# Patient Record
Sex: Male | Born: 1960 | Race: Black or African American | Hispanic: No | Marital: Single | Smoking: Current some day smoker
Health system: Southern US, Community
[De-identification: ages and names within clinical notes are randomized; demographics above are authoritative.]

## PROBLEM LIST (undated history)

## (undated) ENCOUNTER — Telehealth

## (undated) ENCOUNTER — Telehealth: Attending: Registered" | Primary: Registered"

## (undated) ENCOUNTER — Ambulatory Visit: Payer: MEDICAID

## (undated) ENCOUNTER — Encounter: Attending: Nurse Practitioner | Primary: Nurse Practitioner

## (undated) ENCOUNTER — Encounter: Attending: Hematology & Oncology | Primary: Hematology & Oncology

## (undated) ENCOUNTER — Telehealth: Attending: Clinical | Primary: Clinical

## (undated) ENCOUNTER — Telehealth
Attending: Student in an Organized Health Care Education/Training Program | Primary: Student in an Organized Health Care Education/Training Program

## (undated) ENCOUNTER — Ambulatory Visit: Payer: MEDICAID | Attending: Hematology & Oncology | Primary: Hematology & Oncology

## (undated) ENCOUNTER — Encounter
Attending: Student in an Organized Health Care Education/Training Program | Primary: Student in an Organized Health Care Education/Training Program

## (undated) ENCOUNTER — Ambulatory Visit

## (undated) ENCOUNTER — Telehealth: Attending: Hematology & Oncology | Primary: Hematology & Oncology

## (undated) ENCOUNTER — Encounter

## (undated) ENCOUNTER — Encounter: Attending: Internal Medicine | Primary: Internal Medicine

## (undated) ENCOUNTER — Ambulatory Visit: Payer: MEDICAID | Attending: Nurse Practitioner | Primary: Nurse Practitioner

## (undated) ENCOUNTER — Encounter: Attending: Oncology | Primary: Oncology

## (undated) ENCOUNTER — Ambulatory Visit
Payer: MEDICAID | Attending: Student in an Organized Health Care Education/Training Program | Primary: Student in an Organized Health Care Education/Training Program

## (undated) ENCOUNTER — Telehealth: Attending: Nurse Practitioner | Primary: Nurse Practitioner

## (undated) ENCOUNTER — Ambulatory Visit
Payer: MEDICAID | Attending: Pharmacist Clinician (PhC)/ Clinical Pharmacy Specialist | Primary: Pharmacist Clinician (PhC)/ Clinical Pharmacy Specialist

## (undated) ENCOUNTER — Telehealth: Attending: Geriatric Medicine | Primary: Geriatric Medicine

## (undated) ENCOUNTER — Ambulatory Visit: Payer: MEDICAID | Attending: Clinical | Primary: Clinical

## (undated) ENCOUNTER — Encounter: Attending: Clinical | Primary: Clinical

## (undated) ENCOUNTER — Telehealth
Attending: Pharmacist Clinician (PhC)/ Clinical Pharmacy Specialist | Primary: Pharmacist Clinician (PhC)/ Clinical Pharmacy Specialist

## (undated) ENCOUNTER — Institutional Professional Consult (permissible substitution)
Payer: MEDICAID | Attending: Student in an Organized Health Care Education/Training Program | Primary: Student in an Organized Health Care Education/Training Program

## (undated) ENCOUNTER — Ambulatory Visit: Payer: MEDICAID | Attending: Geriatric Medicine | Primary: Geriatric Medicine

## (undated) ENCOUNTER — Ambulatory Visit: Payer: MEDICAID | Attending: Internal Medicine | Primary: Internal Medicine

## (undated) ENCOUNTER — Ambulatory Visit
Attending: Pharmacist Clinician (PhC)/ Clinical Pharmacy Specialist | Primary: Pharmacist Clinician (PhC)/ Clinical Pharmacy Specialist

## (undated) ENCOUNTER — Ambulatory Visit: Payer: MEDICAID | Attending: Registered" | Primary: Registered"

## (undated) DIAGNOSIS — I10 Essential (primary) hypertension: Secondary | ICD-10-CM

## (undated) DIAGNOSIS — K859 Acute pancreatitis without necrosis or infection, unspecified: Secondary | ICD-10-CM

## (undated) DIAGNOSIS — C801 Malignant (primary) neoplasm, unspecified: Secondary | ICD-10-CM

## (undated) DIAGNOSIS — J449 Chronic obstructive pulmonary disease, unspecified: Secondary | ICD-10-CM

## (undated) HISTORY — PX: PROSTATE SURGERY: SHX751

---
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
## 2006-01-13 ENCOUNTER — Other Ambulatory Visit: Payer: Self-pay

## 2006-01-13 ENCOUNTER — Emergency Department: Payer: Self-pay | Admitting: Emergency Medicine

## 2015-02-14 ENCOUNTER — Emergency Department: Payer: Self-pay | Admitting: Emergency Medicine

## 2015-03-30 ENCOUNTER — Emergency Department
Admit: 2015-03-30 | Disposition: A | Discharge status: Home / Self Care | Payer: Self-pay | Admitting: Emergency Medicine

## 2015-03-30 LAB — COMPREHENSIVE METABOLIC PANEL
ANION GAP: 16 (ref 7–16)
AST: 65 U/L — AB
Albumin: 3.4 g/dL — ABNORMAL LOW
Alkaline Phosphatase: 51 U/L
BUN: 11 mg/dL
Bilirubin,Total: 0.6 mg/dL
CHLORIDE: 96 mmol/L — AB
Calcium, Total: 8.7 mg/dL — ABNORMAL LOW
Co2: 18 mmol/L — ABNORMAL LOW
Creatinine: 1.36 mg/dL — ABNORMAL HIGH
GFR CALC NON AF AMER: 58 — AB
GLUCOSE: 120 mg/dL — AB
Potassium: 2.7 mmol/L — ABNORMAL LOW
SGPT (ALT): 29 U/L
Sodium: 130 mmol/L — ABNORMAL LOW
TOTAL PROTEIN: 7.6 g/dL

## 2015-03-30 LAB — URINALYSIS, COMPLETE
Bilirubin,UR: NEGATIVE
GLUCOSE, UR: NEGATIVE mg/dL (ref 0–75)
NITRITE: NEGATIVE
PH: 5 (ref 4.5–8.0)
Protein: 100
RBC,UR: 5 /HPF (ref 0–5)
Specific Gravity: 1.02 (ref 1.003–1.030)
Squamous Epithelial: 1
WBC UR: 227 /HPF (ref 0–5)

## 2015-03-30 LAB — CBC WITH DIFFERENTIAL/PLATELET
BASOS PCT: 0.8 %
Basophil #: 0.1 10*3/uL (ref 0.0–0.1)
EOS ABS: 0 10*3/uL (ref 0.0–0.7)
Eosinophil %: 0 %
HCT: 34.8 % — AB (ref 40.0–52.0)
HGB: 12.1 g/dL — AB (ref 13.0–18.0)
Lymphocyte #: 1.3 10*3/uL (ref 1.0–3.6)
Lymphocyte %: 9.8 %
MCH: 32.8 pg (ref 26.0–34.0)
MCHC: 34.8 g/dL (ref 32.0–36.0)
MCV: 94 fL (ref 80–100)
Monocyte #: 1.2 x10 3/mm — ABNORMAL HIGH (ref 0.2–1.0)
Monocyte %: 9.2 %
NEUTROS ABS: 10.6 10*3/uL — AB (ref 1.4–6.5)
Neutrophil %: 80.2 %
Platelet: 196 10*3/uL (ref 150–440)
RBC: 3.69 10*6/uL — ABNORMAL LOW (ref 4.40–5.90)
RDW: 12.6 % (ref 11.5–14.5)
WBC: 13.2 10*3/uL — ABNORMAL HIGH (ref 3.8–10.6)

## 2015-03-30 LAB — TROPONIN I: Troponin-I: <0.03 ng/mL

## 2015-03-30 LAB — LIPASE, BLOOD: LIPASE: 48 U/L

## 2015-04-04 ENCOUNTER — Emergency Department
Admit: 2015-04-04 | Disposition: A | Discharge status: Home / Self Care | Payer: Self-pay | Admitting: Emergency Medicine

## 2015-04-06 ENCOUNTER — Ambulatory Visit
Admit: 2015-04-06 | Disposition: A | Discharge status: Home / Self Care | Payer: Self-pay | Attending: Oncology | Admitting: Oncology

## 2015-04-06 LAB — CBC CANCER CENTER
Basophil #: 0.1 x10 3/mm (ref 0.0–0.1)
Basophil %: 1.4 %
EOS ABS: 0.2 x10 3/mm (ref 0.0–0.7)
EOS PCT: 2 %
HCT: 38.4 % — AB (ref 40.0–52.0)
HGB: 13.5 g/dL (ref 13.0–18.0)
LYMPHS PCT: 25.7 %
Lymphocyte #: 2.4 x10 3/mm (ref 1.0–3.6)
MCH: 32.8 pg (ref 26.0–34.0)
MCHC: 35.1 g/dL (ref 32.0–36.0)
MCV: 94 fL (ref 80–100)
Monocyte #: 0.9 x10 3/mm (ref 0.2–1.0)
Monocyte %: 9.2 %
NEUTROS PCT: 61.7 %
Neutrophil #: 5.7 x10 3/mm (ref 1.4–6.5)
Platelet: 513 x10 3/mm — ABNORMAL HIGH (ref 150–440)
RBC: 4.1 10*6/uL — ABNORMAL LOW (ref 4.40–5.90)
RDW: 12.9 % (ref 11.5–14.5)
WBC: 9.2 x10 3/mm (ref 3.8–10.6)

## 2015-04-06 LAB — COMPREHENSIVE METABOLIC PANEL
Albumin: 3.9 g/dL
Alkaline Phosphatase: 60 U/L
Anion Gap: 10 (ref 7–16)
BILIRUBIN TOTAL: 0.6 mg/dL
BUN: 15 mg/dL
CO2: 24 mmol/L
CREATININE: 1.33 mg/dL — AB
Calcium, Total: 9.2 mg/dL
Chloride: 101 mmol/L
GFR CALC NON AF AMER: 59 — AB
Glucose: 94 mg/dL
Potassium: 3.3 mmol/L — ABNORMAL LOW
SGOT(AST): 57 U/L — ABNORMAL HIGH
SGPT (ALT): 40 U/L
Sodium: 135 mmol/L
TOTAL PROTEIN: 8.5 g/dL — AB

## 2015-04-06 LAB — PROTIME-INR
INR: 1
PROTHROMBIN TIME: 13.4 s

## 2015-04-06 LAB — LACTATE DEHYDROGENASE: LDH: 196 U/L — AB

## 2015-04-06 LAB — LIPASE, BLOOD: LIPASE: 36 U/L

## 2015-04-06 LAB — AMYLASE: AMYLASE: 130 U/L — AB

## 2015-04-07 LAB — PSA: PSA: 7.9 ng/mL — AB (ref 0.0–4.0)

## 2015-04-09 LAB — URINE CULTURE

## 2015-04-12 ENCOUNTER — Other Ambulatory Visit: Payer: Self-pay | Admitting: Oncology

## 2015-04-12 DIAGNOSIS — M549 Dorsalgia, unspecified: Secondary | ICD-10-CM

## 2015-04-17 LAB — BASIC METABOLIC PANEL
Anion Gap: 11 (ref 7–16)
BUN: 15 mg/dL
CALCIUM: 9.1 mg/dL
CO2: 17 mmol/L — AB
CREATININE: 1.09 mg/dL
Chloride: 109 mmol/L
Glucose: 96 mg/dL
POTASSIUM: 3.2 mmol/L — AB
Sodium: 137 mmol/L

## 2015-05-08 ENCOUNTER — Other Ambulatory Visit: Payer: Self-pay | Admitting: *Deleted

## 2015-05-08 DIAGNOSIS — C649 Malignant neoplasm of unspecified kidney, except renal pelvis: Secondary | ICD-10-CM

## 2015-05-08 MED ORDER — OXYCODONE HCL 10 MG PO TABS: ORAL_TABLET | ORAL | Status: DC

## 2015-05-08 NOTE — Telephone Encounter (Signed)
Informed that prescription is ready to pick up  

## 2015-05-08 NOTE — Telephone Encounter (Signed)
Requesting refill on Oxycodone 10 mg, but he is not due a refill  Until Saturday.

## 2015-05-09 ENCOUNTER — Encounter
Admission: RE | Admit: 2015-05-09 | Discharge: 2015-05-09 | Disposition: A | Discharge status: Home / Self Care | Payer: Medicaid Other | Source: Ambulatory Visit | Attending: Oncology | Admitting: Oncology

## 2015-05-09 ENCOUNTER — Other Ambulatory Visit: Payer: Self-pay | Admitting: *Deleted

## 2015-05-09 DIAGNOSIS — M549 Dorsalgia, unspecified: Secondary | ICD-10-CM | POA: Insufficient documentation

## 2015-05-09 DIAGNOSIS — C649 Malignant neoplasm of unspecified kidney, except renal pelvis: Secondary | ICD-10-CM | POA: Diagnosis present

## 2015-05-09 HISTORY — DX: Malignant (primary) neoplasm, unspecified: C80.1

## 2015-05-09 MED ORDER — TECHNETIUM TC 99M MEDRONATE IV KIT
25.0000 | PACK | Freq: Once | INTRAVENOUS | Status: AC | PRN
  Administered 2015-05-09: 23.14 via INTRAVENOUS

## 2015-05-11 ENCOUNTER — Other Ambulatory Visit: Payer: Self-pay

## 2015-05-11 ENCOUNTER — Ambulatory Visit: Payer: Self-pay | Admitting: Oncology

## 2015-05-12 ENCOUNTER — Other Ambulatory Visit: Payer: Self-pay | Admitting: *Deleted

## 2015-05-12 DIAGNOSIS — N2889 Other specified disorders of kidney and ureter: Secondary | ICD-10-CM

## 2015-05-15 ENCOUNTER — Other Ambulatory Visit: Payer: Self-pay

## 2015-05-15 ENCOUNTER — Inpatient Hospital Stay: Payer: Medicaid Other | Attending: Oncology | Admitting: Oncology

## 2015-05-16 ENCOUNTER — Telehealth: Payer: Self-pay

## 2015-05-16 NOTE — Telephone Encounter (Signed)
Oncology Nurse Navigator Documentation  Oncology Nurse Navigator Flowsheets 05/16/2015  Navigator Encounter Type Telephone  Interventions Coordination of Care  Coordination of Care MD Appointments  Time Spent with Patient 30   Alec Hart did not show for his appt with Alec Hart on 5/23. Spoke with Alec Hart at [facility] and he did not show for his appt 5/20 at her office either. Called and spoke with his brother Alec Hart who states that he went to [facility] ED yesterday for difficulty with voiding. He was released from the ED. Alec Hart was able to get him an appt 5/25 at [facility] with Alec [facility]. Alec Hart notified due to Alec Hart not being home. Expressed the importance of him making it to this appt and asked him to call me back if would not be able to make this appt date/time. He verbalized understanding. Readback of appt confirmed.

## 2015-05-23 ENCOUNTER — Telehealth: Payer: Self-pay | Admitting: *Deleted

## 2015-05-23 NOTE — Telephone Encounter (Signed)
Informed pt his prescription is not due at this time. Per Alec Hart, we ar enot to refill any more narcotics for this patient as his care has been transferred to [facility] and they are writing narcotics for him

## 2015-11-01 IMAGING — NM NM BONE WHOLE BODY
2 series · 6 of 6 positions shown · non-contrast
Comparison: CT of the chest 04/20/2015. CT abdomen and pelvis
03/30/2015.

CLINICAL DATA: Low back pain. Remote history of prostate cancer.
Left kidney mass.

EXAM:
NUCLEAR MEDICINE WHOLE BODY BONE SCAN
TECHNIQUE: Whole body anterior and posterior images were obtained approximately
3 hours after intravenous injection of radiopharmaceutical.
RADIOPHARMACEUTICALS:  23.14 mCi Kechnetium-VVm MDP IV

[Series 1000: 3 hr wholebody · 2.40mm/px · 2 of 2 frames shown]
[frame 1/2]
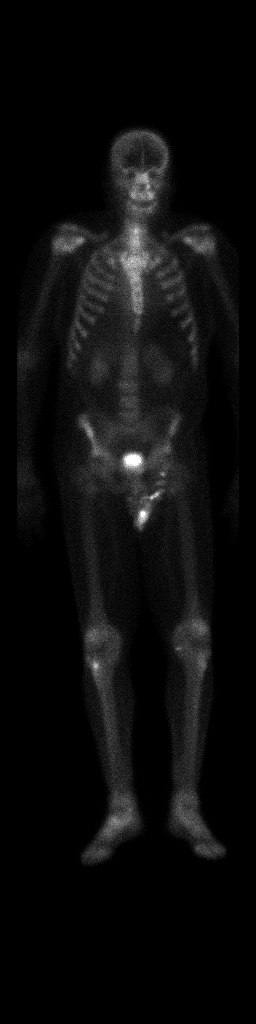
[frame 2/2]
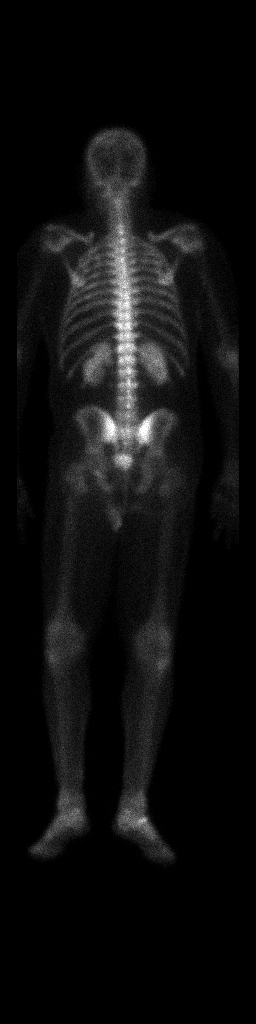

[Series 1000: statics · 2.40mm/px · 2 acquisitions, 4 frames shown]
[im 1/2]
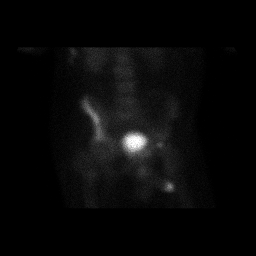
[im 1/2]
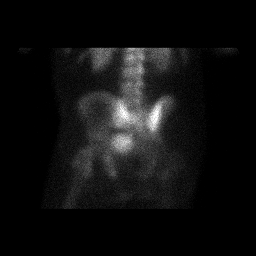
[im 2/2]
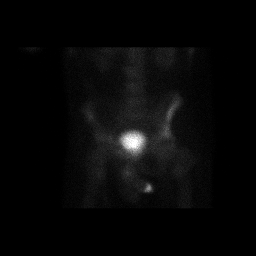
[im 2/2]
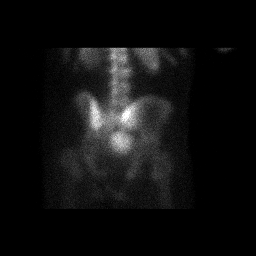

[6 of 6 positions shown; findings below may reference images not displayed]

FINDINGS: No areas of suspicious bony uptake. Increased activity in the knees
and right foot compatible with degenerative uptake. Contamination
external to the patient noted in the left inguinal/groin region.
IMPRESSION: No areas of suspicious bony uptake.

## 2016-05-27 ENCOUNTER — Emergency Department
Admission: EM | Admit: 2016-05-27 | Discharge: 2016-05-27 | Disposition: A | Discharge status: Home / Self Care | Payer: No Typology Code available for payment source | Attending: Emergency Medicine | Admitting: Emergency Medicine

## 2016-05-27 ENCOUNTER — Encounter: Payer: Self-pay | Admitting: Emergency Medicine

## 2016-05-27 DIAGNOSIS — M542 Cervicalgia: Secondary | ICD-10-CM | POA: Diagnosis not present

## 2016-05-27 DIAGNOSIS — Z041 Encounter for examination and observation following transport accident: Secondary | ICD-10-CM | POA: Diagnosis present

## 2016-05-27 DIAGNOSIS — Z8546 Personal history of malignant neoplasm of prostate: Secondary | ICD-10-CM | POA: Diagnosis not present

## 2016-05-27 DIAGNOSIS — Y92410 Unspecified street and highway as the place of occurrence of the external cause: Secondary | ICD-10-CM | POA: Diagnosis not present

## 2016-05-27 DIAGNOSIS — Y939 Activity, unspecified: Secondary | ICD-10-CM | POA: Insufficient documentation

## 2016-05-27 DIAGNOSIS — F17200 Nicotine dependence, unspecified, uncomplicated: Secondary | ICD-10-CM | POA: Insufficient documentation

## 2016-05-27 DIAGNOSIS — Y999 Unspecified external cause status: Secondary | ICD-10-CM | POA: Insufficient documentation

## 2016-05-27 MED ORDER — CYCLOBENZAPRINE HCL 5 MG PO TABS: 5.0000 mg | ORAL_TABLET | Freq: Three times a day (TID) | ORAL | Status: DC | PRN
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
## 2016-12-13 ENCOUNTER — Emergency Department
Admission: EM | Admit: 2016-12-13 | Discharge: 2016-12-13 | Disposition: A | Discharge status: Home / Self Care | Payer: Medicaid Other | Attending: Emergency Medicine | Admitting: Emergency Medicine

## 2016-12-13 ENCOUNTER — Encounter: Payer: Self-pay | Admitting: Emergency Medicine

## 2016-12-13 ENCOUNTER — Emergency Department: Payer: Medicaid Other

## 2016-12-13 DIAGNOSIS — Y929 Unspecified place or not applicable: Secondary | ICD-10-CM | POA: Diagnosis not present

## 2016-12-13 DIAGNOSIS — W010XXA Fall on same level from slipping, tripping and stumbling without subsequent striking against object, initial encounter: Secondary | ICD-10-CM | POA: Insufficient documentation

## 2016-12-13 DIAGNOSIS — Z79899 Other long term (current) drug therapy: Secondary | ICD-10-CM | POA: Insufficient documentation

## 2016-12-13 DIAGNOSIS — S63502A Unspecified sprain of left wrist, initial encounter: Secondary | ICD-10-CM | POA: Diagnosis not present

## 2016-12-13 DIAGNOSIS — Z8546 Personal history of malignant neoplasm of prostate: Secondary | ICD-10-CM | POA: Insufficient documentation

## 2016-12-13 DIAGNOSIS — Y939 Activity, unspecified: Secondary | ICD-10-CM | POA: Insufficient documentation

## 2016-12-13 DIAGNOSIS — W19XXXA Unspecified fall, initial encounter: Secondary | ICD-10-CM

## 2016-12-13 DIAGNOSIS — Y999 Unspecified external cause status: Secondary | ICD-10-CM | POA: Diagnosis not present

## 2016-12-13 DIAGNOSIS — F17200 Nicotine dependence, unspecified, uncomplicated: Secondary | ICD-10-CM | POA: Insufficient documentation

## 2016-12-13 DIAGNOSIS — S6992XA Unspecified injury of left wrist, hand and finger(s), initial encounter: Secondary | ICD-10-CM | POA: Diagnosis present

## 2016-12-13 MED ORDER — OXYCODONE HCL 5 MG PO TABS: 5.0000 mg | ORAL_TABLET | Freq: Four times a day (QID) | ORAL | 0 refills | Status: DC | PRN

## 2016-12-13 MED ORDER — OXYCODONE HCL 5 MG PO TABS
5.0000 mg | ORAL_TABLET | Freq: Once | ORAL | Status: AC
  Administered 2016-12-13: 5 mg via ORAL
  Filled 2016-12-13: qty 1

## 2016-12-13 NOTE — ED Notes (Signed)
Ace wrap and sling applied to left wrist

## 2016-12-13 NOTE — ED Triage Notes (Signed)
Pt presents with left wrist pain after fall 3-4 days ago. Pt states he broke his wrist many years ago, and this feels similar. Pt alert & oriented with NAD noted.

## 2016-12-13 NOTE — ED Provider Notes (Signed)
[facility] Emergency Department Provider Note  ____________________________________________  Time seen: Approximately 10:25 AM  I have reviewed the triage vital signs and the nursing notes.   HISTORY  Chief Complaint Wrist Pain    HPI Alec Hart. is a 55 y.o. male , NAD, presents to the emergency department with four-day history of left wrist pain and swelling. Patient states he tripped and fell with his left arm outstretched. He believes that he sprained his wrist and has been taking over-the-counter Tylenol which has not been helping. Over the last 2 days he has noted significant swelling and pain increasing that is not controlled with over-the-counter medicines. 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 Denies any numbness, weakness or tingling. Has not noted any open wounds or lacerations. Has not had a headache, visual changes, lightheadedness, dizziness, chest pain, shortness breath to cause the fall. Denies head injury, neck or back pain.    Past Medical History:  Diagnosis Date  . Cancer [facility])    prostate ca    There are no active problems to display for this patient.   Past Surgical History:  Procedure Laterality Date  . PROSTATE SURGERY      Prior to Admission medications   Medication Sig Start Date End Date Taking? Authorizing Provider  cyclobenzaprine (FLEXERIL) 5 MG tablet Take 1 tablet (5 mg total) by mouth every 8 (eight) hours as needed for muscle spasms. 05/27/16   Jenise V Bacon Menshew, PA-C  oxyCODONE (OXY IR/ROXICODONE) 5 MG immediate release tablet Take 1 tablet (5 mg total) by mouth every 6 (six) hours as needed for severe pain. 12/13/16   Shylyn Younce L Syncere Eble, PA-C    Allergies Penicillins  History reviewed. No pertinent family history.  Social History Social History  Substance Use Topics  . Smoking status:  Current Some Day Smoker  . Smokeless tobacco: Never Used  . Alcohol use Yes     Comment: occasional     Review of Systems  Constitutional: No fatigue Eyes: No visual changes.  Cardiovascular: No chest pain. Respiratory: No shortness of breath. Musculoskeletal: Positive left wrist pain. Negative for back, neck pain.  Skin: Positive swelling left wrist and hand. Negative for rash, redness, abnormal warmth, bruising, open wounds or lacerations. Neurological: Negative for headaches, focal weakness or numbness. No tingling. No head injury, LOC, lightheadedness or dizziness. 10-point ROS otherwise negative.  ____________________________________________   PHYSICAL EXAM:  VITAL SIGNS: ED Triage Vitals  Enc Vitals Group     BP      Pulse      Resp      Temp      Temp src      SpO2      Weight      Height      Head Circumference      Peak Flow      Pain Score      Pain Loc      Pain Edu?      Excl. in [place]?      Constitutional: Alert and oriented. Well appearing and in no acute distress. Eyes: Conjunctivae are normal.  Head: Atraumatic. Cardiovascular: Good peripheral circulation with 2+ pulses noted in the left upper extremity. Capillary refill is brisk in all digits of the left hand. Respiratory: Normal respiratory effort without tachypnea or retractions.  Musculoskeletal: Significant pain to  palpation over the distal left forearm about the distal radius and ulna but no crepitus or bony abnormalities were noted. No pain to palpation of the left fingers or hand. Patient is able to make a fist but feels pain in the distal forearm when doing so. Neurologic:  Normal speech and language. Gait and posture is normal. No gross focal neurologic deficits are appreciated.  Skin:  Significant swelling is noted about the distal left forearm through the distal left hand dorsally. No open wounds or lesions. Oozing or weeping. Skin is warm, dry and intact. No rash, redness, abnormal warmth,  bruising or lacerations noted. Psychiatric: Mood and affect are normal. Speech and behavior are normal. Patient exhibits appropriate insight and judgement.   ____________________________________________   LABS  None ____________________________________________  EKG  None ____________________________________________  RADIOLOGY I, Braxton Feathers, personally viewed and evaluated these images (plain radiographs) as part of my medical decision making, as well as reviewing the written report by the radiologist.  Dg Wrist Complete Left  Result Date: 12/13/2016 CLINICAL DATA:  Left wrist pain after a fall 3-4 days ago. EXAM: LEFT WRIST - COMPLETE 3+ VIEW COMPARISON:  None. FINDINGS: No evidence of fracture. No subluxation or dislocation. Soft tissue edema is seen in the distal forearm. IMPRESSION: Soft tissue edema without acute bony abnormality. Electronically Signed   By: Misty Stanley M.D.   On: 12/13/2016 10:47    ____________________________________________    PROCEDURES  Procedure(s) performed: None   Procedures   Medications  oxyCODONE (Oxy IR/ROXICODONE) immediate release tablet 5 mg (5 mg Oral Given 12/13/16 1045)     ____________________________________________   INITIAL IMPRESSION / ASSESSMENT AND PLAN / ED COURSE  Pertinent labs & imaging results that were available during my care of the patient were reviewed by me and considered in my medical decision making (see chart for details).  Clinical Course as of Dec 13 1129  Fri Dec 13, 2016  [place] controlled substance database was accessed and reviewed prior to prescribing narcotic pain medications. No narcotic pain medications have been reported to the database for th is patient over the last 12 months.  [JH]    Clinical Course User Index [JH] Louise Rawson L Relena Ivancic, PA-C    Patient's diagnosis is consistent with sprain left wrist due to fall. Patient's left wrist and hand were wrapped in an Ace wrap and  was given an arm sling for comfort care. Patient advised to keep the left wrist and hand elevated over the next couple of days to allow decrease in swelling. Patient was given a tablet of oxycodone in the emergency department and notes significant relief of pain. Patient will be discharged home with prescriptions for oxycodone to take as needed every 6 hours for severe pain and use sparingly. Patient is to follow up with Dr. Rudene Christians in orthopedics if symptoms persist past this treatment course. Patient is given ED precautions to return to the ED for any worsening or new symptoms.    ____________________________________________  FINAL CLINICAL IMPRESSION(S) / ED DIAGNOSES  Final diagnoses:  Sprain of left wrist, initial encounter  Fall, initial encounter      NEW MEDICATIONS STARTED DURING THIS VISIT:  New Prescriptions   OXYCODONE (OXY IR/ROXICODONE) 5 MG IMMEDIATE RELEASE TABLET    Take 1 tablet (5 mg total) by mouth every 6 (six) hours as needed for severe pain.         Braxton Feathers, PA-C 12/13/16 1131    Carrie Mew, MD 12/13/16  1555  

## 2016-12-13 NOTE — ED Notes (Signed)
Pt discharged home after verbalizing understanding of discharge instructions; nad noted. 

## 2017-06-23 ENCOUNTER — Ambulatory Visit
Admission: RE | Admit: 2017-06-23 | Discharge: 2017-06-23 | Disposition: A | Discharge status: Home / Self Care | Payer: MEDICAID

## 2017-06-23 DIAGNOSIS — C61 Malignant neoplasm of prostate: Principal | ICD-10-CM

## 2017-07-04 ENCOUNTER — Ambulatory Visit
Admission: RE | Admit: 2017-07-04 | Discharge: 2017-07-04 | Disposition: A | Discharge status: Home / Self Care | Payer: MEDICAID

## 2017-07-04 DIAGNOSIS — C61 Malignant neoplasm of prostate: Principal | ICD-10-CM

## 2017-07-04 DIAGNOSIS — C7951 Secondary malignant neoplasm of bone: Secondary | ICD-10-CM

## 2017-07-04 MED ORDER — POTASSIUM CHLORIDE ER 20 MEQ TABLET,EXTENDED RELEASE(PART/CRYST)
ORAL_TABLET | Freq: Two times a day (BID) | ORAL | 6 refills | 0.00000 days | Status: CP
Start: 2017-07-04 — End: 2017-08-28

## 2017-07-30 ENCOUNTER — Ambulatory Visit
Admission: RE | Admit: 2017-07-30 | Discharge: 2017-07-30 | Disposition: A | Discharge status: Home / Self Care | Payer: MEDICAID | Admitting: Nurse Practitioner

## 2017-07-30 ENCOUNTER — Ambulatory Visit
Admission: RE | Admit: 2017-07-30 | Discharge: 2017-07-30 | Disposition: A | Discharge status: Home / Self Care | Payer: MEDICAID

## 2017-07-30 DIAGNOSIS — C61 Malignant neoplasm of prostate: Secondary | ICD-10-CM

## 2017-07-30 DIAGNOSIS — C7951 Secondary malignant neoplasm of bone: Secondary | ICD-10-CM

## 2017-07-30 MED ORDER — MAGNESIUM OXIDE 400 MG (241.3 MG MAGNESIUM) TABLET
ORAL_TABLET | Freq: Every day | ORAL | 6 refills | 0 days | Status: CP
Start: 2017-07-30 — End: 2017-09-18

## 2017-07-30 MED ORDER — VITAMIN E 400 UNIT CAPSULE
ORAL_CAPSULE | Freq: Every evening | ORAL | 6 refills | 0.00000 days | Status: CP
Start: 2017-07-30 — End: 2018-05-11

## 2017-07-30 NOTE — Unmapped (Signed)
REASON FOR VISIT: Followup prostate cancer, now metastatic      ASSESSMENT AND PLAN:     1. Prostate cancer:    ?? Lupron due again on or after 10/30/17  ?? Reviewed management of hot flashes  ?? Reviewed Abiraterone and prednisone. He would like to start. Will erx ab 1000mg  daily, pred 5 mg BID to [employer].   ?? He assured me that he will be able to get his labs checked every 2 weeks for the first 3 months. Will need to monitor LFTs and K+ closely    I discussed abiraterone [place] Smoker) including rationale, treatment schedule, and potential side effects including but not limited to fatigue, edema, n/v/d, LFT elevation, electrolyte abnormalities, HTN, cardiac failure, adrenal insufficiency, hepatotoxicity, among others.     Reviewed the importance of taking it at the same time each day. He is aware to notify our team of any new medications (prescribed or OTC) so that we can check for drug-drug interaction. Instructed him to read the package instructions that come along with the prescription.     The dose will be 1,000 mg (250mg  x4) daily, may be dose reduced if necessary.    The patient is clear on the need to take Prednisone 5 mg BID. We will continue his primary ADT.    We like for patients to remain on the same therapy unless 1) there is meaningful PSA progression 2) there is significant clinical progression 3) there is a significant progression on CT scan/bone scan including new tumors or significant growth of existing tumors 4) the patient suffers intolerable/uncontrollable side effects, or 5) the patient desires to stop therapy.      The goal of treatment is to slow the progression of his prostate cancer.  Eventually, the abiraterone will stop working and we will need to explore different treatment. We will incorporate best supportive care and palliative care throughout the trajectory of this disease.      2. Hypokalemia    ?? Continue KDur    3. Hypomagnesimia    ?? erx for mag ox    4. Cramps    ?? Increase water intake  ?? Will check lytes  ?? Nightly vitamin E    Of note, he does not have a working phone right now, so we can call his brother, Joseph Phillips, at [phone]    I'll have him come back in about a month for labs and a visit    Approximately 25 minutes was spent in the care of this patient, half in education and counseling.  A copy of the AVS was provided to the patient which includes key points from our visit as well as our contact information.      Joseph Parr, NP-C  Adult Nurse Practitioner  Urology & Medical Oncology          ONCOLOGIC HISTORY:     [name] is status post a robotic-assisted laparoscopic radical prostatectomy under the care of Dr. 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 There is 40% involvement. His pretreatment PSA was 6.2. He is status post external beam radiation under the care of Dr. Cathren Harsh here [facility] as an adjuvant treatment from 05/29/2009 through 07/11/2009, total of 66.6 Gy.     His PSAs have been rising  presenting a biochemical recurrence.     Last scans: 04/2017    HISTORY OF PRESENT ILLNESS:     [name] is a very pleasant  African-American gentleman who is here today to discuss starting abi/pred    He missed his last appt with me due to transportation issues    He has been compliant with his KDur  Notes he is still having some cramps, mainly in his legs. Better than last visit. Much better, in fact none, on a day where he drank a lot of water.    He denies pain      Allergies   Allergen Reactions   ??? Penicillins      Other reaction(s): UNKNOWN     Meds: Reviewed EPIC    ROS: Pertinent + in HPI    Vitals:    07/30/17 1025   BP: 123/76   Pulse: 74   Temp: 36.4 ??C (97.5 ??F)       PHYSICAL EXAMINATION:    GENERAL: Pleasant, NAD.   VS: Reviewed and unremarkable  Head: normocephalic  Eyes: sclera aniecteric Pulm: Non labored WOB  Skin: Warm, dry  Msk: No Le edema  NEURO: A&O x 3    Wt Readings from Last 6 Encounters:   07/30/17 75.7 kg (166 lb 14.4 oz)   07/04/17 73.2 kg (161 lb 6.4 oz)   05/13/17 72.6 kg (160 lb 0.9 oz)   04/30/17 72.6 kg (160 lb 0.9 oz)   04/07/17 73.3 kg (161 lb 9.6 oz)   03/26/17 72.6 kg (160 lb)         MEDICAL DECISION MAKING:     Lab Results   Component Value Date    PSA 3.68 07/30/2017    PSA 4.47 (H) 07/04/2017    PSA 4.68 (H) 06/23/2017    PSA 48.80 (H) 04/30/2017    PSA 29.10 (H) 03/26/2017    PSA 5.21 (H) 10/02/2016       Lab Results   Component Value Date    PSADIAG 4.4 (H) 09/28/2014    PSADIAG 5.0 (H) 05/30/2014    PSADIAG 4.6 (H) 03/23/2014       CT Urogram: 04/22/17    Impression     - Bilateral renal cysts. No evidence for renal or ureteral mass. No renal calculi.  - Mild non-specific bladder wall irregularity, similar to prior.  - New, small sclerotic foci within the T11 and T12 vertebral bodies; osseous metastases cannot be excluded.  - Mildly prominent right pelvic sidewall lymph node is increased in size from prior, but not enlarged by size criteria. Attention on follow-up.  - Cholelithiasis.  - Hepatic steatosis.     Bone scan 04/29/17    Impression     1.Metastases in the proximal left proximal humeral shaft, T3 vertebral body and inferolateral margin of the left scapula suspected. Radiographic examination and/or CT examination could be considered, if clinically warranted, to confirm findings.

## 2017-08-01 MED ORDER — PREDNISONE 5 MG TABLET
ORAL_TABLET | Freq: Two times a day (BID) | ORAL | 11 refills | 0 days | Status: CP
Start: 2017-08-01 — End: 2018-06-29

## 2017-08-01 MED ORDER — ABIRATERONE 250 MG TABLET: 1000 mg | tablet | Freq: Every day | 11 refills | 0 days | Status: AC

## 2017-08-01 MED ORDER — ABIRATERONE 250 MG TABLET
ORAL_TABLET | Freq: Every day | ORAL | 11 refills | 0.00000 days | Status: CP
Start: 2017-08-01 — End: 2017-11-11

## 2017-08-01 NOTE — Unmapped (Unsigned)
Zytiga - $3

## 2017-08-06 MED FILL — ZYTIGA/250MG/TAB: ZYTIGA/250MG/TAB | 30 days supply | Qty: 120 | Fill #0

## 2017-08-06 MED FILL — PREDNISONE/5MG/TAB: PREDNISONE/5MG/TAB | 30 days supply | Qty: 60 | Fill #0

## 2017-08-06 NOTE — Unmapped (Signed)
Clinical Pharmacist Practitioner: [facility]    New Start Abiraterone Education    [name] is a 56 y.o. male with mCSPC who I am counseling today on initiation of oral chemotherapy.    Oral chemotherapy regimen: Abiraterone 1000 mg daily and prednisone 5 mg twice daily  Tentative Start Date: 08/09/17    Side effects discussed included but were not limited to: hypertension, edema, hypokalemia, fatigue, diarrhea, hot flashes, muscle and joint aches and LFT abnormalities. Side effect prevention and management were also reviewed including LFT monitoring every 2 weeks for 3 months and monthly thereafter.     Administration: I reviewed the importance of taking without food i.e. 1h before of 2h after a meal as well as the importance of medication adherence. Also discussed the use of steroids in combination with abiraterone to avoid side effects of adrenal insufficiency.    Drug Interactions: Instructed the patient that this medication has potential drug interactions. The patient should inform me of any new prescriptions so that I may evaluate for potential drug interactions.other medications reviewed and up to date in Epic.  No drug interactions identified.    Storage requirements: this medicine should be stored at room temperature.     Handling precautions reviewed    Comorbidities/Allergies: reviewed and up to date in Epic.    Patient verbalized understanding of the above information as well as how to contact the Team with any questions/concerns.    Assessment/ Plan:  1. Medication Monitoring- will need labs set up every two weeks starting the first week os September.     Time with patient: 10 min    [name] PharmD, BCOP, CPP  Hematology/Oncology Pharmacist  P: [phone]

## 2017-08-06 NOTE — Unmapped (Signed)
[facility] Onboarding    Specialty Medication(s): Zytiga 1000 mg  Additional  Medication(s): Prednisone 5 mg BID  Diagnosis:   Refills: 30 day    Wilburn Mylar, DOB: Oct 29, 1961  Phone: [phone] (home)   Confirmed Shipping address: [address]  [place] [address]  All above HIPAA information was verified with patient.    Next Scheduled Delivery Date: 08/07/17 from [facility]     Financial/Shipment   Primary Billing: Medicaid  Anticipated copay of $3 reviewed with patient (See full details under Referrals tab in EPIC).    Verified delivery address in FSI and reviewed medication storage requirement.    The following was explained to the patient:  Advised patient of the following:  -A Welcome packet will be sent to the patient   -Assignment of Benefit for the patient to review and return before next refill  -Arrangement of payment method can be done by contacting the pharmacy  -Take medications with during travel, have doctor's appointments, or if being admitted to the hospital.    Advised patient of refill order process:  Specialty pharmacy process for medication shipment was also reviewed.  Discussed the service provided by [facility] with respects to monthly outbound calls to the patient to set up refill deliveries 7-10 days prior to their subsequent needed refill.  Emphasized need for patient to be reachable in order to schedule medication shipment and that shipment will be shipped to the deliverable address provided via UPS.  Informed patient that welcome packet will be sent.        Provided the [facility] pharmacy contact information below:  [facility] [phone], option 4)    Medication Education   Patient was counseled on this medication previously please see prior note.     Patient verbalized understanding of the above information.  Answered all patient questions.  [name] team will follow up with patient monthly for standard refill processing and delivery.      [name] PharmD, BCOP, CPP  Hematology/Oncology Pharmacist  P: [phone]

## 2017-08-07 NOTE — Unmapped (Signed)
I left him a message to call me. He can walk in for the lab appointment. Thanks Fanta Wimberley

## 2017-08-11 ENCOUNTER — Ambulatory Visit
Admission: RE | Admit: 2017-08-11 | Discharge: 2017-08-11 | Disposition: A | Discharge status: Home / Self Care | Payer: MEDICAID

## 2017-08-11 DIAGNOSIS — C7951 Secondary malignant neoplasm of bone: Secondary | ICD-10-CM

## 2017-08-11 DIAGNOSIS — C61 Malignant neoplasm of prostate: Principal | ICD-10-CM

## 2017-08-11 LAB — COMPREHENSIVE METABOLIC PANEL
ALBUMIN: 4.4 g/dL (ref 3.5–5.0)
ALT (SGPT): 43 U/L (ref 19–72)
ANION GAP: 14 mmol/L (ref 9–15)
AST (SGOT): 79 U/L — ABNORMAL HIGH (ref 19–55)
BILIRUBIN TOTAL: 0.5 mg/dL (ref 0.0–1.2)
BLOOD UREA NITROGEN: 19 mg/dL (ref 7–21)
BUN / CREAT RATIO: 17
CALCIUM: 10.1 mg/dL (ref 8.5–10.2)
CHLORIDE: 101 mmol/L (ref 98–107)
CREATININE: 1.12 mg/dL (ref 0.70–1.30)
EGFR MDRD AF AMER: >=60 mL/min/{1.73_m2} (ref >=60)
EGFR MDRD NON AF AMER: >=60 mL/min/{1.73_m2} (ref >=60)
GLUCOSE RANDOM: 76 mg/dL (ref 65–179)
POTASSIUM: 3.3 mmol/L — ABNORMAL LOW (ref 3.5–5.0)
PROTEIN TOTAL: 8.3 g/dL (ref 6.5–8.3)
SODIUM: 138 mmol/L (ref 135–145)

## 2017-08-11 LAB — CO2: Carbon dioxide:SCnc:Pt:Ser/Plas:Qn:: 23

## 2017-08-11 NOTE — Unmapped (Signed)
Lab drawn and sent for analysis.

## 2017-08-12 NOTE — Unmapped (Signed)
Left VM asking him to call us back so that we can confirm whether or not he is taking his potassium supp  mdunn

## 2017-08-13 NOTE — Unmapped (Signed)
Called and left VM for pt to increase potassium to 2 tabs in the morning, 1 tab in the evening. Call if he needs a new rx  mDunn

## 2017-08-22 NOTE — Unmapped (Signed)
Called [phone] and  the number is not in service. Also called other number (brother) and left voicemail with callback number.

## 2017-08-26 NOTE — Unmapped (Signed)
Spoke with pt - let him know that we needs labs since he started on abiraterone. Pt will call me back tomorrow to finalize plans for labs.

## 2017-08-26 NOTE — Unmapped (Signed)
Left message with callback number. Called to follow up on labs.

## 2017-08-27 ENCOUNTER — Ambulatory Visit
Admission: RE | Admit: 2017-08-27 | Discharge: 2017-08-27 | Disposition: A | Discharge status: Home / Self Care | Payer: MEDICAID

## 2017-08-27 DIAGNOSIS — C61 Malignant neoplasm of prostate: Principal | ICD-10-CM

## 2017-08-27 LAB — COMPREHENSIVE METABOLIC PANEL
ALBUMIN: 4 g/dL (ref 3.5–5.0)
ALKALINE PHOSPHATASE: 102 U/L (ref 38–126)
ALT (SGPT): 38 U/L (ref 19–72)
ANION GAP: 11 mmol/L (ref 9–15)
BILIRUBIN TOTAL: 0.5 mg/dL (ref 0.0–1.2)
BLOOD UREA NITROGEN: 16 mg/dL (ref 7–21)
BUN / CREAT RATIO: 16
CALCIUM: 9.1 mg/dL (ref 8.5–10.2)
CHLORIDE: 103 mmol/L (ref 98–107)
CO2: 26 mmol/L (ref 22.0–30.0)
CREATININE: 0.98 mg/dL (ref 0.70–1.30)
EGFR MDRD AF AMER: >=60 mL/min/{1.73_m2} (ref >=60)
GLUCOSE RANDOM: 103 mg/dL (ref 65–179)
POTASSIUM: 3.4 mmol/L — ABNORMAL LOW (ref 3.5–5.0)
PROTEIN TOTAL: 7.7 g/dL (ref 6.5–8.3)
SODIUM: 140 mmol/L (ref 135–145)

## 2017-08-27 LAB — CO2: Carbon dioxide:SCnc:Pt:Ser/Plas:Qn:: 26

## 2017-08-27 NOTE — Unmapped (Signed)
Pt's phone number was not in service. Called brother's number and left message we callback number.

## 2017-08-27 NOTE — Unmapped (Signed)
Lab drawn and sent for analysis.

## 2017-08-28 MED ORDER — POTASSIUM CHLORIDE ER 20 MEQ TABLET,EXTENDED RELEASE(PART/CRYST): 40 meq | tablet | Freq: Every day | 11 refills | 0 days | Status: AC

## 2017-08-28 MED ORDER — POTASSIUM CHLORIDE ER 20 MEQ TABLET,EXTENDED RELEASE(PART/CRYST)
ORAL_TABLET | Freq: Every day | ORAL | 11 refills | 0.00000 days | Status: CP
Start: 2017-08-28 — End: 2017-09-11

## 2017-08-28 NOTE — Unmapped (Signed)
Spoke with pt and let him know that his K was 3.4 and that he was taking KCL 40 meq BID. Instructed to take KCl 60 meq in the morning and at night. Confirmed appointments on 9/10 for labs and to see [name].

## 2017-09-02 NOTE — Unmapped (Signed)
Outpatient Sw Note - Telephone Call    SW received in-basket from [employer], re; Pt missed his appointment yesterday, he reports that gas card he received from [facility] does not work. SW to investigate.     SW contacted patient by phone. SW advised patient to bring unused gas card in for replacement at next appointment. Pt agreed with this plan.     SW available for further consult as needed.

## 2017-09-11 ENCOUNTER — Ambulatory Visit
Admission: RE | Admit: 2017-09-11 | Discharge: 2017-09-11 | Disposition: A | Discharge status: Home / Self Care | Payer: MEDICAID

## 2017-09-11 ENCOUNTER — Ambulatory Visit
Admission: RE | Admit: 2017-09-11 | Discharge: 2017-09-11 | Disposition: A | Discharge status: Home / Self Care | Payer: MEDICAID | Admitting: Nurse Practitioner

## 2017-09-11 DIAGNOSIS — C61 Malignant neoplasm of prostate: Principal | ICD-10-CM

## 2017-09-11 LAB — CBC W/ AUTO DIFF
BASOPHILS ABSOLUTE COUNT: 0.1 10*9/L (ref 0.0–0.1)
EOSINOPHILS ABSOLUTE COUNT: 0.1 10*9/L (ref 0.0–0.4)
HEMATOCRIT: 32.1 % — ABNORMAL LOW (ref 41.0–53.0)
HEMOGLOBIN: 11.3 g/dL — ABNORMAL LOW (ref 13.5–17.5)
LARGE UNSTAINED CELLS: 2 % (ref 0–4)
MEAN CORPUSCULAR HEMOGLOBIN: 33.8 pg (ref 26.0–34.0)
MEAN CORPUSCULAR VOLUME: 95.8 fL (ref 80.0–100.0)
MEAN PLATELET VOLUME: 8 fL (ref 7.0–10.0)
MONOCYTES ABSOLUTE COUNT: 0.4 10*9/L (ref 0.2–0.8)
NEUTROPHILS ABSOLUTE COUNT: 3.9 10*9/L (ref 2.0–7.5)
PLATELET COUNT: 208 10*9/L (ref 150–440)
RED CELL DISTRIBUTION WIDTH: 13.8 % (ref 12.0–15.0)

## 2017-09-11 LAB — COMPREHENSIVE METABOLIC PANEL
ALBUMIN: 4.1 g/dL (ref 3.5–5.0)
ALKALINE PHOSPHATASE: 62 U/L (ref 38–126)
ALT (SGPT): 45 U/L (ref 19–72)
ANION GAP: 16 mmol/L — ABNORMAL HIGH (ref 9–15)
AST (SGOT): 89 U/L — ABNORMAL HIGH (ref 19–55)
BILIRUBIN TOTAL: 0.6 mg/dL (ref 0.0–1.2)
BLOOD UREA NITROGEN: 11 mg/dL (ref 7–21)
BUN / CREAT RATIO: 9
CALCIUM: 9.3 mg/dL (ref 8.5–10.2)
CHLORIDE: 99 mmol/L (ref 98–107)
CO2: 26 mmol/L (ref 22.0–30.0)
CREATININE: 1.16 mg/dL (ref 0.70–1.30)
EGFR MDRD AF AMER: >=60 mL/min/{1.73_m2} (ref >=60)
EGFR MDRD NON AF AMER: >=60 mL/min/{1.73_m2} (ref >=60)
GLUCOSE RANDOM: 105 mg/dL (ref 65–179)
PROTEIN TOTAL: 7.9 g/dL (ref 6.5–8.3)
SODIUM: 141 mmol/L (ref 135–145)

## 2017-09-11 LAB — PROSTATE SPECIFIC ANTIGEN: Prostate specific Ag:MCnc:Pt:Ser/Plas:Qn:: 2.32

## 2017-09-11 LAB — PLATELET COUNT: Lab: 208

## 2017-09-11 LAB — CO2: Carbon dioxide:SCnc:Pt:Ser/Plas:Qn:: 26

## 2017-09-11 MED ORDER — POTASSIUM CHLORIDE ER 20 MEQ TABLET,EXTENDED RELEASE(PART/CRYST)
ORAL_TABLET | Freq: Two times a day (BID) | ORAL | 6 refills | 0.00000 days | Status: CP
Start: 2017-09-11 — End: 2017-09-13

## 2017-09-11 NOTE — Unmapped (Signed)
Pt left clinic today without being seen-lab results completed- potassium low, Per Corrie Dandy Dunn's recommendations-pt needs IV potassium. Pt unable to come back to infusion-increased oral potassium to BID. Pt will come back and have labs rechecked on Saturday 9/22-pt verbalized that he was able to come on Saturday. Sent a request to scheduling.

## 2017-09-11 NOTE — Unmapped (Signed)
Lab drawn and sent for analysis.

## 2017-09-12 NOTE — Unmapped (Signed)
appts 

## 2017-09-12 NOTE — Unmapped (Signed)
-----   Message from Lidia Collum sent at 09/11/2017  4:47 PM EDT -----  Lanier Ensign    Appointment has been scheduled. Patient Joseph Phillips has been notified of appointment details and verbalized satisfaction with appointment date and time.     Thank you,  Amy Graylin Shiver  [facility]  [phone]      ----- Message -----  From: Marica Otter, RN  Sent: 09/11/2017   3:25 PM  To: Ola Spurr Scheduling Infusion    Hi- can you please schedule pt an lab appt for 9/22. Are you going to call pt with time?    Thanks,  J

## 2017-09-13 ENCOUNTER — Ambulatory Visit: Admission: RE | Admit: 2017-09-13 | Discharge: 2017-09-13 | Disposition: A | Discharge status: Home / Self Care

## 2017-09-13 DIAGNOSIS — E876 Hypokalemia: Principal | ICD-10-CM

## 2017-09-13 LAB — POTASSIUM: Potassium:SCnc:Pt:Ser/Plas:Qn:: 3.2 — ABNORMAL LOW

## 2017-09-13 MED ORDER — POTASSIUM CHLORIDE ER 20 MEQ TABLET,EXTENDED RELEASE(PART/CRYST)
ORAL_TABLET | Freq: Two times a day (BID) | ORAL | 0 refills | 0.00000 days | Status: CP
Start: 2017-09-13 — End: 2018-03-26

## 2017-09-13 NOTE — Unmapped (Signed)
If you feel like this is an emergency please call 911.  For appointments or questions Monday through Friday 8AM-5PM please call [phone] or Toll Free [phone]. For Medical questions or concerns ask for the Nurse Triage Line.  On Nights, Weekends, and Holidays call [phone] and ask for the Oncologist on Call.  Reasons to call the Nurse Triage Line:  Fever of 100.5 or greater  Nausea and/or vomiting not relived with nausea medicine  Diarrhea or constipation  Severe pain not relieved with usual pain regimen  Shortness of breath  Uncontrolled bleeding  Mental status changes

## 2017-09-13 NOTE — Unmapped (Signed)
Called by infusion RN. Per policy patient can only get a max of of IV potassium today due to unit protocol of max 59meq/hr (they close at 3pm). K is 3.2. Suggest continue BID for next 3 days then return to maintenance schedule. Refill ok KCL called in.

## 2017-09-13 NOTE — Unmapped (Signed)
Encounter addended by: Annell Greening, RN on: 09/13/2017  3:22 PM<BR>    Actions taken: LDA properties accepted, Flowsheet accepted

## 2017-09-13 NOTE — Unmapped (Signed)
Pt here for potassium replacement; Potassium level drawn as per order. Pt's level is 3.2 - as orders are written, pt would have to have 6 bags of potassium 10 Meq per hour - pt only as a peripheral IV and per policy can only give per hr w/ peripheral IV. Dr.Jolly paged; Will give patient 52 Meq IV today and pt to continue to increase oral supplementation until Monday.     Pt received 30 Meq of Potassium for K of 3.2 today. Pt to continue with increased oral supplementation through Monday. PIV D/C'd intact per protocol. Pt discharged from clinic stable and ambulatory; accompanied by his family.

## 2017-09-17 NOTE — Unmapped (Signed)
Patient has been taking 1 tab trazadone 100mg ,please advise

## 2017-09-18 ENCOUNTER — Ambulatory Visit
Admission: RE | Admit: 2017-09-18 | Discharge: 2017-09-18 | Disposition: A | Discharge status: Home / Self Care | Payer: MEDICAID

## 2017-09-18 DIAGNOSIS — S0993XA Unspecified injury of face, initial encounter: Principal | ICD-10-CM

## 2017-09-18 DIAGNOSIS — C61 Malignant neoplasm of prostate: Principal | ICD-10-CM

## 2017-09-18 LAB — COMPREHENSIVE METABOLIC PANEL
ALBUMIN: 4.4 g/dL (ref 3.5–5.0)
ALKALINE PHOSPHATASE: 79 U/L (ref 38–126)
ANION GAP: 14 mmol/L (ref 9–15)
AST (SGOT): 68 U/L — ABNORMAL HIGH (ref 19–55)
BILIRUBIN TOTAL: 0.8 mg/dL (ref 0.0–1.2)
BLOOD UREA NITROGEN: 15 mg/dL (ref 7–21)
BUN / CREAT RATIO: 18
CALCIUM: 9.3 mg/dL (ref 8.5–10.2)
CHLORIDE: 101 mmol/L (ref 98–107)
CO2: 26 mmol/L (ref 22.0–30.0)
CREATININE: 0.85 mg/dL (ref 0.70–1.30)
EGFR MDRD AF AMER: >=60 mL/min/{1.73_m2} (ref >=60)
GLUCOSE RANDOM: 109 mg/dL (ref 65–179)
POTASSIUM: 3.9 mmol/L (ref 3.5–5.0)
PROTEIN TOTAL: 8.2 g/dL (ref 6.5–8.3)
SODIUM: 141 mmol/L (ref 135–145)

## 2017-09-18 LAB — PSA, DIAGNOSTIC: PROSTATE SPECIFIC ANTIGEN: 1.78 ng/mL (ref 0.00–4.00)

## 2017-09-18 LAB — MAGNESIUM
MAGNESIUM: 0.9 mg/dL — CL (ref 1.6–2.2)
Magnesium:MCnc:Pt:Ser/Plas:Qn:: 0.9 — CL

## 2017-09-18 LAB — CBC W/ AUTO DIFF
BASOPHILS ABSOLUTE COUNT: 0 10*9/L (ref 0.0–0.1)
EOSINOPHILS ABSOLUTE COUNT: 0.1 10*9/L (ref 0.0–0.4)
HEMATOCRIT: 32.8 % — ABNORMAL LOW (ref 41.0–53.0)
HEMOGLOBIN: 11.2 g/dL — ABNORMAL LOW (ref 13.5–17.5)
LARGE UNSTAINED CELLS: 1 % (ref 0–4)
MEAN CORPUSCULAR HEMOGLOBIN CONC: 34.3 g/dL (ref 31.0–37.0)
MEAN CORPUSCULAR HEMOGLOBIN: 33.5 pg (ref 26.0–34.0)
MEAN CORPUSCULAR VOLUME: 97.6 fL (ref 80.0–100.0)
MEAN PLATELET VOLUME: 7.8 fL (ref 7.0–10.0)
MONOCYTES ABSOLUTE COUNT: 0.4 10*9/L (ref 0.2–0.8)
NEUTROPHILS ABSOLUTE COUNT: 3.8 10*9/L (ref 2.0–7.5)
PLATELET COUNT: 210 10*9/L (ref 150–440)
RED BLOOD CELL COUNT: 3.36 10*12/L — ABNORMAL LOW (ref 4.50–5.90)
RED CELL DISTRIBUTION WIDTH: 14.5 % (ref 12.0–15.0)

## 2017-09-18 LAB — CHLORIDE: Chloride:SCnc:Pt:Ser/Plas:Qn:: 101

## 2017-09-18 LAB — PROSTATE SPECIFIC ANTIGEN: Prostate specific Ag:MCnc:Pt:Ser/Plas:Qn:: 1.78

## 2017-09-18 LAB — WBC ADJUSTED: Lab: 5.9

## 2017-09-18 MED ORDER — VITAMIN B COMPLEX CAPSULE
ORAL_CAPSULE | Freq: Every day | ORAL | 6 refills | 0.00000 days | Status: SS
Start: 2017-09-18 — End: 2018-09-26

## 2017-09-18 MED ORDER — MAGNESIUM OXIDE 400 MG (241.3 MG MAGNESIUM) TABLET
ORAL_TABLET | Freq: Two times a day (BID) | ORAL | 6 refills | 0 days | Status: CP
Start: 2017-09-18 — End: 2018-08-21

## 2017-09-18 NOTE — Unmapped (Addendum)
??   Try vitamin B complex pill; sent to your pharmacy  ?? Please restart your magnesium pills. One pill two times/day  ?? Potassium 3 pills two times/day  ?? Keep taking your cancer medicine  ?? Will get facial x-ray    [name], NP-C, OCN  Adult Nurse Practitioner  Urology and Medical Oncology    If you have been prescribed a medication today, always read the package insert that comes with the medication.    In the event of an emergency, always call 911    Urology:  Main clinic & [facility]: [phone]  Fax: [phone]    Cancer Hospital:  Phone: [phone]  Fax: [phone]    After hours/nights/weekends:  Hospital Operator: [phone]    [email or web address]  [email or web address]

## 2017-09-18 NOTE — Unmapped (Signed)
Pt c/o nose bleeding every morning since his last OV.

## 2017-09-18 NOTE — Unmapped (Signed)
REASON FOR VISIT: Followup prostate cancer, now metastatic      ASSESSMENT AND PLAN:     1. Prostate cancer:    ?? Lupron due again on or after 10/30/17  ?? Continue Abiraterone and prednisone.       2. Hypokalemia    ?? Continue KDur, improved    3. Hypomagnesimia    ?? He is unable to stay for infusion  ?? Needs to restart mag ox ASAP; he will pick it up on his way home today  ?? Increase mag ox to bid (he was only taking it daily)    4. Cramps    ?? Increase water intake  ?? Will check lytes  ?? Nightly vitamin E; add vitamin B complex, rx sent    5. Fall    ?? Offered facial x-ray today, he declined but would like to have it done next week    He is going to come back next week for labs, visit, infusion appt in the event he needs lyte replacement.  Seek urgent/emergent care for any sx of electroyle abnorms, which we reviewed    Of note, he does not have a working phone right now, so we can call his brother, [name], at [phone]        Approximately 25 minutes was spent in the care of this patient, half in education and counseling.  A copy of the AVS was provided to the patient which includes key points from our visit as well as our contact information.      [name], NP-C  Adult Nurse Practitioner  Urology & Medical Oncology          ONCOLOGIC HISTORY:     Joseph Phillips is status post a robotic-assisted laparoscopic radical prostatectomy under the care of Dr. 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 There is 40% involvement. His pretreatment PSA was 6.2. He is status post external beam radiation under the care of Dr. Cathren Harsh here at [facility] as an adjuvant treatment from 05/29/2009 through 07/11/2009, total of 66.6 Gy.     His PSAs have been rising presenting a biochemical recurrence.     Last scans: 04/2017    HISTORY OF PRESENT ILLNESS:     Joseph Phillips is a very pleasant  African-American gentleman     He fell last week in the middle of the night when he got up to void and had leg cramps  He hit the left side of his face and said his entire eye was swollen  He still has a bruise under eye and a know over his eye. He says it is much better.     He has been compliant with his KDur and we again discussed the importance of this  Notes he is still having some cramps, mainly in his legs.     He stopped taking his magnesium a week or two ago.           Allergies   Allergen Reactions   ??? Penicillins      Other reaction(s): UNKNOWN     Meds: Reviewed EPIC    ROS: Pertinent + in HPI    Vitals:    09/18/17 0926   BP: 160/87   Pulse: 77   Resp: 16   Temp: 36.6 ??  C (97.8 ??F)   SpO2: 100%       PHYSICAL EXAMINATION:    GENERAL: Pleasant, NAD.   VS: Reviewed and unremarkable  Head: normocephalic  Eyes: sclera aniecteric. Trauma above left eye  Pulm: Non labored WOB  Skin: Warm, dry  Msk: No Le edema  NEURO: A&O x 3    Wt Readings from Last 6 Encounters:   09/18/17 74.7 kg (164 lb 11.2 oz)   09/13/17 74.4 kg (164 lb 0.4 oz)   07/30/17 75.7 kg (166 lb 14.4 oz)   07/04/17 73.2 kg (161 lb 6.4 oz)   05/13/17 72.6 kg (160 lb 0.9 oz)   04/30/17 72.6 kg (160 lb 0.9 oz)         MEDICAL DECISION MAKING:     Lab Results   Component Value Date    PSA 1.78 09/18/2017    PSA 2.32 09/11/2017    PSA 3.68 07/30/2017    PSA 4.47 (H) 07/04/2017    PSA 4.68 (H) 06/23/2017    PSA 48.80 (H) 04/30/2017       Lab Results   Component Value Date    PSADIAG 4.4 (H) 09/28/2014    PSADIAG 5.0 (H) 05/30/2014    PSADIAG 4.6 (H) 03/23/2014       Chemistry        Component Value Date/Time    NA 141 09/18/2017 0857    NA 140 02/20/2015 1121    K 3.9 09/18/2017 0857    K 3.5 02/20/2015 1121    CL 101 09/18/2017 0857    CL 104 02/20/2015 1121    CO2 26.0 09/18/2017 0857    CO2 23 02/20/2015 1121    BUN 15 09/18/2017 0857    BUN 13 02/20/2015 1121    CREATININE 0.85 09/18/2017 0857    CREATININE 1.04 02/20/2015 1121    CREATININE 1.4 07/25/2014 1123    CREATININE 1.6 (H) 07/25/2014 1118    GLU 109 09/18/2017 0857    GLU 112 02/20/2015 1121        Component Value Date/Time    CALCIUM 9.3 09/18/2017 0857    CALCIUM 8.4 (L) 02/20/2015 1121    ALKPHOS 79 09/18/2017 0857    AST 68 (H) 09/18/2017 0857    AST 38 09/11/2011 1637    ALT 33 09/18/2017 0857    ALT 31 09/11/2011 1637    BILITOT 0.8 09/18/2017 0857    BILITOT 0.5 09/11/2011 1637            CT Urogram: 04/22/17    Impression     - Bilateral renal cysts. No evidence for renal or ureteral mass. No renal calculi.  - Mild non-specific bladder wall irregularity, similar to prior.  - New, small sclerotic foci within the T11 and T12 vertebral bodies; osseous metastases cannot be excluded.  - Mildly prominent right pelvic sidewall lymph node is increased in size from prior, but not enlarged by size criteria. Attention on follow-up.  - Cholelithiasis.  - Hepatic steatosis.     Bone scan 04/29/17    Impression     1.Metastases in the proximal left proximal humeral shaft, T3 vertebral body and inferolateral margin of the left scapula suspected. Radiographic examination and/or CT examination could be considered, if clinically warranted, to confirm findings.

## 2017-09-18 NOTE — Unmapped (Signed)
Lab drawn and sent for analysis, care provided by E. Conservation officer, historic buildings

## 2017-09-21 ENCOUNTER — Encounter: Payer: Self-pay | Admitting: Emergency Medicine

## 2017-09-21 ENCOUNTER — Emergency Department
Admission: EM | Admit: 2017-09-21 | Discharge: 2017-09-21 | Disposition: A | Discharge status: Home / Self Care | Payer: Medicaid Other | Attending: Emergency Medicine | Admitting: Emergency Medicine

## 2017-09-21 DIAGNOSIS — F1010 Alcohol abuse, uncomplicated: Secondary | ICD-10-CM

## 2017-09-21 DIAGNOSIS — Z8546 Personal history of malignant neoplasm of prostate: Secondary | ICD-10-CM | POA: Insufficient documentation

## 2017-09-21 DIAGNOSIS — F17200 Nicotine dependence, unspecified, uncomplicated: Secondary | ICD-10-CM | POA: Insufficient documentation

## 2017-09-21 DIAGNOSIS — M2550 Pain in unspecified joint: Secondary | ICD-10-CM | POA: Diagnosis present

## 2017-09-21 DIAGNOSIS — R252 Cramp and spasm: Secondary | ICD-10-CM | POA: Diagnosis not present

## 2017-09-21 LAB — BASIC METABOLIC PANEL
ANION GAP: 14 (ref 5–15)
BUN: 17 mg/dL (ref 6–20)
CO2: 20 mmol/L — AB (ref 22–32)
Calcium: 9.4 mg/dL (ref 8.9–10.3)
Chloride: 99 mmol/L — ABNORMAL LOW (ref 101–111)
Creatinine, Ser: 1.42 mg/dL — ABNORMAL HIGH (ref 0.61–1.24)
GFR calc Af Amer: >60 mL/min (ref >60)
GFR, EST NON AFRICAN AMERICAN: 53 mL/min — AB (ref >60)
GLUCOSE: 93 mg/dL (ref 65–99)
POTASSIUM: 4.3 mmol/L (ref 3.5–5.1)
Sodium: 133 mmol/L — ABNORMAL LOW (ref 135–145)

## 2017-09-21 LAB — CBC WITH DIFFERENTIAL/PLATELET
Basophils Absolute: 0.1 10*3/uL (ref 0–0.1)
Basophils Relative: 1 %
EOS ABS: 0.1 10*3/uL (ref 0–0.7)
Eosinophils Relative: 1 %
HEMATOCRIT: 35.1 % — AB (ref 40.0–52.0)
HEMOGLOBIN: 12.5 g/dL — AB (ref 13.0–18.0)
LYMPHS ABS: 1.1 10*3/uL (ref 1.0–3.6)
LYMPHS PCT: 10 %
MCH: 35.1 pg — ABNORMAL HIGH (ref 26.0–34.0)
MCHC: 35.6 g/dL (ref 32.0–36.0)
MCV: 98.6 fL (ref 80.0–100.0)
MONOS PCT: 7 %
Monocytes Absolute: 0.7 10*3/uL (ref 0.2–1.0)
NEUTROS PCT: 81 %
Neutro Abs: 8.8 10*3/uL — ABNORMAL HIGH (ref 1.4–6.5)
Platelets: 187 10*3/uL (ref 150–440)
RBC: 3.56 MIL/uL — ABNORMAL LOW (ref 4.40–5.90)
RDW: 13.7 % (ref 11.5–14.5)
WBC: 10.7 10*3/uL — ABNORMAL HIGH (ref 3.8–10.6)

## 2017-09-21 LAB — HEPATIC FUNCTION PANEL
ALBUMIN: 4.3 g/dL (ref 3.5–5.0)
ALT: 26 U/L (ref 17–63)
AST: 78 U/L — AB (ref 15–41)
Alkaline Phosphatase: 76 U/L (ref 38–126)
BILIRUBIN DIRECT: 0.4 mg/dL (ref 0.1–0.5)
BILIRUBIN TOTAL: 1.2 mg/dL (ref 0.3–1.2)
Indirect Bilirubin: 0.8 mg/dL (ref 0.3–0.9)
Total Protein: 8.4 g/dL — ABNORMAL HIGH (ref 6.5–8.1)

## 2017-09-21 LAB — MAGNESIUM: MAGNESIUM: 1.5 mg/dL — AB (ref 1.7–2.4)

## 2017-09-21 LAB — PHOSPHORUS: Phosphorus: 3.9 mg/dL (ref 2.5–4.6)

## 2017-09-21 LAB — ETHANOL: Alcohol, Ethyl (B): 57 mg/dL — ABNORMAL HIGH (ref <10)

## 2017-09-21 LAB — LIPASE, BLOOD: Lipase: 44 U/L (ref 11–51)

## 2017-09-21 MED ORDER — SODIUM CHLORIDE 0.9 % IV BOLUS (SEPSIS)
1000.0000 mL | Freq: Once | INTRAVENOUS | Status: AC
  Administered 2017-09-21: 1000 mL via INTRAVENOUS

## 2017-09-21 MED ORDER — MORPHINE SULFATE (PF) 4 MG/ML IV SOLN
4.0000 mg | Freq: Once | INTRAVENOUS | Status: AC
  Administered 2017-09-21: 4 mg via INTRAVENOUS
  Filled 2017-09-21: qty 1

## 2017-09-21 MED ORDER — KETOROLAC TROMETHAMINE 30 MG/ML IJ SOLN
15.0000 mg | Freq: Once | INTRAMUSCULAR | Status: AC
  Administered 2017-09-21: 15 mg via INTRAVENOUS
  Filled 2017-09-21: qty 1

## 2017-09-21 MED ORDER — MAGNESIUM SULFATE 4 GM/100ML IV SOLN
4.0000 g | Freq: Once | INTRAVENOUS | Status: AC
  Administered 2017-09-21: 4 g via INTRAVENOUS
  Filled 2017-09-21: qty 100

## 2017-09-21 NOTE — ED Provider Notes (Signed)
[facility] Emergency Department Provider Note  ____________________________________________   First MD Initiated Contact with Patient 09/21/17 1148     (approximate)  I have reviewed the triage vital signs and the nursing notes.   HISTORY  Chief Complaint Joint Pain (hx of low potassium, low magnesium)   HPI Alec Hart. is a 56 y.o. male who comes to the emergency department via EMS with roughly 24 hours of diffuse severe generalized full body pain. 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 He was seen at the [facility] 3 days ago and his magnesium was found to be 0.9 and they recommended an infusion, however he declined stating he had to get home. He reports intermittent compliance with his potassium and magnesium supplementation. He also has a past medical history of metastatic prostate cancer. His pain is severe throbbing aching constant somewhat worse with movement and somewhat improved with rest but not particularly so. He denies chest pain shortness of breath. He denies abdominal pain nausea or vomiting.   Past Medical History:  Diagnosis Date  . Cancer [facility])    prostate ca    There are no active problems to display for this patient.   Past Surgical History:  Procedure Laterality Date  . PROSTATE SURGERY      Prior to Admission medications   Medication Sig Start Date End Date Taking? Authorizing Provider  cyclobenzaprine (FLEXERIL) 5 MG tablet Take 1 tablet (5 mg total) by mouth every 8 (eight) hours as needed for muscle spasms. 05/27/16   Menshew, Dannielle Karvonen, PA-C  oxyCODONE (OXY IR/ROXICODONE) 5 MG immediate release tablet Take 1 tablet (5 mg total) by mouth every 6 (six) hours as needed for severe pain. 12/13/16   Hagler, Jami L, PA-C    Allergies Penicillins  History reviewed. No pertinent family history.  Social  History Social History  Substance Use Topics  . Smoking status: Current Some Day Smoker  . Smokeless tobacco: Never Used  . Alcohol use Yes     Comment: occasional    Review of Systems Constitutional: No fever/chills Eyes: No visual changes. ENT: No sore throat. Cardiovascular: Denies chest pain. Respiratory: Denies shortness of breath. Gastrointestinal: No abdominal pain.  No nausea, no vomiting.  No diarrhea.  No constipation. Genitourinary: Negative for dysuria. Musculoskeletal: Negative for back pain. Skin: Negative for rash. Neurological: Negative for headaches, focal weakness or numbness.   ____________________________________________   PHYSICAL EXAM:  VITAL SIGNS: ED Triage Vitals  Enc Vitals Group     BP 09/21/17 1146 135/85     Pulse Rate 09/21/17 1146 78     Resp 09/21/17 1146 19     Temp 09/21/17 1146 97.8 F (36.6 C)     Temp Source 09/21/17 1146 Oral     SpO2 09/21/17 1146 100 %     Weight 09/21/17 1148 164 lb (74.4 kg)     Height 09/21/17 1148 5' 8.5" (1.74 m)     Head Circumference --      Peak Flow --      Pain Score --      Pain Loc --      Pain Edu? --      Excl. in [place]? --     Constitutional: alert and oriented 4 appears somewhat uncomfortable nontoxic no diaphoresis speaks full clear sentences Eyes: PERRL EOMI. Head: Atraumatic. Nose: No congestion/rhinnorhea. Mouth/Throat: No trismus Neck: No stridor.  Cardiovascular: Normal rate, regular rhythm. Grossly normal heart sounds.  Good peripheral circulation. Respiratory: Normal respiratory effort.  No retractions. Lungs CTAB and moving good air Gastrointestinal: soft nontender Musculoskeletal: No lower extremity edema   Neurologic:  Normal speech and language. No gross focal neurologic deficits are appreciated. Skin:  Skin is warm, dry and intact. No rash noted. Psychiatric: Mood and affect are normal. Speech and behavior are normal.    ____________________________________________    DIFFERENTIAL includes but not limited to  polyarthritis, myopathy, metabolic derangement, chronic pancreatitis ____________________________________________   LABS (all labs ordered are listed, but only abnormal results are displayed)  Labs Reviewed  BASIC METABOLIC PANEL - Abnormal; Notable for the following:       Result Value   Sodium 133 (*)    Chloride 99 (*)    CO2 20 (*)    Creatinine, Ser 1.42 (*)    GFR calc non Af Amer 53 (*)    All other components within normal limits  HEPATIC FUNCTION PANEL - Abnormal; Notable for the following:    Total Protein 8.4 (*)    AST 78 (*)    All other components within normal limits  CBC WITH DIFFERENTIAL/PLATELET - Abnormal; Notable for the following:    WBC 10.7 (*)    RBC 3.56 (*)    Hemoglobin 12.5 (*)    HCT 35.1 (*)    MCH 35.1 (*)    Neutro Abs 8.8 (*)    All other components within normal limits  MAGNESIUM - Abnormal; Notable for the following:    Magnesium 1.5 (*)    All other components within normal limits  ETHANOL - Abnormal; Notable for the following:    Alcohol, Ethyl (B) 57 (*)    All other components within normal limits  LIPASE, BLOOD  PHOSPHORUS    blood work reviewed interpreted by me shows hypomagnesemia __________________________________________  EKG  ED ECG REPORT I, Darel Hong, the attending physician, personally viewed and interpreted this ECG.  Date: 09/21/2017 EKG Time:  Rate: 76 Rhythm: normal sinus rhythm QRS Axis: normal Intervals: normal ST/T Wave abnormalities: normal Narrative Interpretation: no evidence of acute ischemia  ____________________________________________  RADIOLOGY   ____________________________________________   PROCEDURES  Procedure(s) performed: no  Procedures  Critical Care performed: no  Observation: no ____________________________________________   INITIAL IMPRESSION / ASSESSMENT AND PLAN / ED COURSE  Pertinent labs & imaging results that were  available during my care of the patient were reviewed by me and considered in my medical decision making (see chart for details).  The patient arrives somewhat uncomfortable appearing with reported history of multiple metabolic derangements. Blood work today shows hypomagnesemia which is been replenished with 4 g. Following this his symptoms are nearly completely resolved. I've encouraged him to remain compliant with his outpatient medications and to follow up as currently scheduled. He verbalizes understanding and agreement with the plan.      ____________________________________________   FINAL CLINICAL IMPRESSION(S) / ED DIAGNOSES  Final diagnoses:  Alcohol abuse  Hypomagnesemia  Muscle cramps      NEW MEDICATIONS STARTED DURING THIS VISIT:  Discharge Medication List as of 09/21/2017  2:06 PM       Note:  This document was prepared using Dragon voice recognition software and may include unintentional dictation errors.     Darel Hong, MD 09/21/17 2223

## 2017-09-21 NOTE — Discharge Instructions (Signed)
Today your magnesium was 1.5 which is low although not as low as it is been in the past. Please make sure you continue to take your over-the-counter supplementation the way your primary care physician wants you too and follow-up next week for reevaluation. Return to the emergency department sooner for any concerns.  It was a pleasure to take care of you today, and thank you for coming to our emergency department.  If you have any questions or concerns before leaving please ask the nurse to grab me and I'm more than happy to go through your aftercare instructions again.  If you were prescribed any opioid pain medication today such as Norco, Vicodin, Percocet, morphine, hydrocodone, or oxycodone please make sure you do not drive when you are taking this medication as it can alter your ability to drive safely.  If you have any concerns once you are home that you are not improving or are in fact getting worse before you can make it to your follow-up appointment, please do not hesitate to call 911 and come back for further evaluation.  Darel Hong, MD  Results for orders placed or performed during the hospital encounter of 74/16/38  Basic metabolic panel  Result Value Ref Range   Sodium 133 (L) 135 - 145 mmol/L   Potassium 4.3 3.5 - 5.1 mmol/L   Chloride 99 (L) 101 - 111 mmol/L   CO2 20 (L) 22 - 32 mmol/L   Glucose, Bld 93 65 - 99 mg/dL   BUN 17 6 - 20 mg/dL   Creatinine, Ser 1.42 (H) 0.61 - 1.24 mg/dL   Calcium 9.4 8.9 - 10.3 mg/dL   GFR calc non Af Amer 53 (L) >60 mL/min   GFR calc Af Amer >60 >60 mL/min   Anion gap 14 5 - 15  Hepatic function panel  Result Value Ref Range   Total Protein 8.4 (H) 6.5 - 8.1 g/dL   Albumin 4.3 3.5 - 5.0 g/dL   AST 78 (H) 15 - 41 U/L   ALT 26 17 - 63 U/L   Alkaline Phosphatase 76 38 - 126 U/L   Total Bilirubin 1.2 0.3 - 1.2 mg/dL   Bilirubin, Direct 0.4 0.1 - 0.5 mg/dL   Indirect Bilirubin 0.8 0.3 - 0.9 mg/dL  Lipase, blood  Result Value Ref Range   Lipase 44 11 - 51 U/L  CBC with Differential  Result Value Ref Range   WBC 10.7 (H) 3.8 - 10.6 K/uL   RBC 3.56 (L) 4.40 - 5.90 MIL/uL   Hemoglobin 12.5 (L) 13.0 - 18.0 g/dL   HCT 35.1 (L) 40.0 - 52.0 %   MCV 98.6 80.0 - 100.0 fL   MCH 35.1 (H) 26.0 - 34.0 pg   MCHC 35.6 32.0 - 36.0 g/dL   RDW 13.7 11.5 - 14.5 %   Platelets 187 150 - 440 K/uL   Neutrophils Relative % 81 %   Neutro Abs 8.8 (H) 1.4 - 6.5 K/uL   Lymphocytes Relative 10 %   Lymphs Abs 1.1 1.0 - 3.6 K/uL   Monocytes Relative 7 %   Monocytes Absolute 0.7 0.2 - 1.0 K/uL   Eosinophils Relative 1 %   Eosinophils Absolute 0.1 0 - 0.7 K/uL   Basophils Relative 1 %   Basophils Absolute 0.1 0 - 0.1 K/uL  Magnesium  Result Value Ref Range   Magnesium 1.5 (L) 1.7 - 2.4 mg/dL  Phosphorus  Result Value Ref Range   Phosphorus 3.9 2.5 - 4.6 mg/dL  Ethanol  Result Value Ref Range   Alcohol, Ethyl (B) 57 (H) <10 mg/dL

## 2017-09-21 NOTE — ED Triage Notes (Signed)
Pt presents to ED c/o generalized joint pain /cramps x24 hours. Pt also reports hx of low potassium and low magnesium as the reason for his cramps

## 2017-09-22 MED FILL — ZYTIGA/250MG/TAB: ZYTIGA/250MG/TAB | 30 days supply | Qty: 120 | Fill #1

## 2017-09-22 MED FILL — PREDNISONE/5MG/TAB: PREDNISONE/5MG/TAB | 30 days supply | Qty: 60 | Fill #1

## 2017-09-22 NOTE — Unmapped (Signed)
[facility] Refill Coordination Note  Specialty Medication(s): ZYTIGA  Additional Medications shipped: PREDNISONE    Wilburn Mylar, DOB: [birth year]  Phone: [phone] (home) , Alternate phone contact: N/A  Phone or address changes today?: No  All above HIPAA information was verified with patient.  Shipping Address: [address]  [place] [address]   Insurance changes? No    Completed refill call assessment today to schedule patient's medication shipment from the [facility] [phone]).      Confirmed the medication and dosage are correct and have not changed: Yes, regimen is correct and unchanged.    Confirmed patient started or stopped the following medications in the past month:  No, there are no changes reported at this time.    Are you tolerating your medication?:  [name] reports tolerating the medication.    ADHERENCE    Did you miss any doses in the past 4 weeks? No missed doses reported.    FINANCIAL/SHIPPING    Delivery Scheduled: Yes, Expected medication delivery date: 09/24/17     [name] did not have any additional questions at this time.    Delivery address validated in [facility] scheduling system: Yes, address listed in [facility] is correct.    We will follow up with patient monthly for standard refill processing and delivery.      Thank you,  Marletta Lor   [facility] Specialty Pharmacist

## 2017-09-25 ENCOUNTER — Ambulatory Visit
Admission: RE | Admit: 2017-09-25 | Discharge: 2017-09-25 | Disposition: A | Discharge status: Home / Self Care | Payer: MEDICAID

## 2017-09-25 DIAGNOSIS — S0993XA Unspecified injury of face, initial encounter: Principal | ICD-10-CM

## 2017-10-02 ENCOUNTER — Ambulatory Visit
Admission: RE | Admit: 2017-10-02 | Discharge: 2017-10-02 | Disposition: A | Discharge status: Home / Self Care | Payer: MEDICAID

## 2017-10-02 ENCOUNTER — Ambulatory Visit
Admission: RE | Admit: 2017-10-02 | Discharge: 2017-10-02 | Disposition: A | Discharge status: Home / Self Care | Payer: MEDICAID | Admitting: Nurse Practitioner

## 2017-10-02 DIAGNOSIS — C61 Malignant neoplasm of prostate: Principal | ICD-10-CM

## 2017-10-02 DIAGNOSIS — C7951 Secondary malignant neoplasm of bone: Secondary | ICD-10-CM

## 2017-10-02 LAB — CBC W/ AUTO DIFF
BASOPHILS ABSOLUTE COUNT: 0.1 10*9/L (ref 0.0–0.1)
EOSINOPHILS ABSOLUTE COUNT: 0 10*9/L (ref 0.0–0.4)
HEMATOCRIT: 35.8 % — ABNORMAL LOW (ref 41.0–53.0)
HEMOGLOBIN: 12.3 g/dL — ABNORMAL LOW (ref 13.5–17.5)
LARGE UNSTAINED CELLS: 1 % (ref 0–4)
LYMPHOCYTES ABSOLUTE COUNT: 1.5 10*9/L (ref 1.5–5.0)
MEAN CORPUSCULAR HEMOGLOBIN CONC: 34.4 g/dL (ref 31.0–37.0)
MEAN CORPUSCULAR HEMOGLOBIN: 33.9 pg (ref 26.0–34.0)
MEAN CORPUSCULAR VOLUME: 98.8 fL (ref 80.0–100.0)
MEAN PLATELET VOLUME: 7.8 fL (ref 7.0–10.0)
MONOCYTES ABSOLUTE COUNT: 0.6 10*9/L (ref 0.2–0.8)
NEUTROPHILS ABSOLUTE COUNT: 7.9 10*9/L — ABNORMAL HIGH (ref 2.0–7.5)
PLATELET COUNT: 260 10*9/L (ref 150–440)
WBC ADJUSTED: 10.1 10*9/L (ref 4.5–11.0)

## 2017-10-02 LAB — COMPREHENSIVE METABOLIC PANEL
ALKALINE PHOSPHATASE: 73 U/L (ref 38–126)
ALT (SGPT): 44 U/L (ref 19–72)
ANION GAP: 14 mmol/L (ref 9–15)
AST (SGOT): 75 U/L — ABNORMAL HIGH (ref 19–55)
BILIRUBIN TOTAL: 0.5 mg/dL (ref 0.0–1.2)
BLOOD UREA NITROGEN: 20 mg/dL (ref 7–21)
BUN / CREAT RATIO: 18
CALCIUM: 10.2 mg/dL (ref 8.5–10.2)
CHLORIDE: 105 mmol/L (ref 98–107)
CO2: 20 mmol/L — ABNORMAL LOW (ref 22.0–30.0)
CREATININE: 1.13 mg/dL (ref 0.70–1.30)
EGFR MDRD AF AMER: >=60 mL/min/{1.73_m2} (ref >=60)
GLUCOSE RANDOM: 80 mg/dL (ref 65–179)
POTASSIUM: 4.7 mmol/L (ref 3.5–5.0)
PROTEIN TOTAL: 8.3 g/dL (ref 6.5–8.3)
SODIUM: 139 mmol/L (ref 135–145)

## 2017-10-02 LAB — PROSTATE SPECIFIC ANTIGEN: Prostate specific Ag:MCnc:Pt:Ser/Plas:Qn:: 1.66

## 2017-10-02 LAB — MAGNESIUM: Magnesium:MCnc:Pt:Ser/Plas:Qn:: 1.9

## 2017-10-02 LAB — GLUCOSE RANDOM: Glucose:MCnc:Pt:Ser/Plas:Qn:: 80

## 2017-10-02 LAB — MEAN PLATELET VOLUME: Lab: 7.8

## 2017-10-02 MED ORDER — OXYCODONE 5 MG TABLET
ORAL_TABLET | Freq: Three times a day (TID) | ORAL | 0 refills | 0 days | Status: CP | PRN
Start: 2017-10-02 — End: 2017-10-16

## 2017-10-02 NOTE — Unmapped (Addendum)
??   Keep magnesium the same; 1 pill two times/day  ?? Keep taking your vitamin E  ?? Take potassium 3 pills in the morning and 2 pills in the evening  ?? Keep taking your cancer medicine  ?? I will send in some pain medicine. Take only if needed. Drink plenty of water and eat fiber  ?? Call [employer] to see if they have gas cards [phone]       [name], NP-C, OCN  Adult Nurse Practitioner  Urology and Medical Oncology    If you have been prescribed a medication today, always read the package insert that comes with the medication.    In the event of an emergency, always call 911    Urology:  Main clinic & [facility]: [phone]  Fax: [phone]    Cancer Hospital:  Phone: [phone]  Fax: [phone]    After hours/nights/weekends:  Hospital Operator: [phone]    [email or web address]  [email or web address]

## 2017-10-02 NOTE — Unmapped (Signed)
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 Prostate cancer:    ?? Lupron due again on or after 10/30/17  ?? Continue Abiraterone and prednisone.   ?? Come back in 2 weeks for CMP and mag check (need to continue every 2 week lab monitoring until at least 3 months on abi)  ?? I gave him the # to [facility] to see if they have any gas cards      2. Hypokalemia    ?? Continue KDur; reduce to 60 mEq qam/40 mEq qpm    3. Hypomagnesimia    ?? Continue mag ox BID    4. Cramps    ?? Increase water intake  ?? Nightly vitamin E; add vitamin B complex  ?? Better since he has consistently been taking his electrolyte supplements        RTC 2 weeks as above    Of note, he does not have a working phone right now, so we can call his brother, Joseph Phillips, at [phone]        Approximately 25 minutes was spent in the care of this patient, half in education and counseling.  A copy of the AVS was provided to the patient which includes key points from our visit as well as our contact information.      [name], NP-C  Adult Nurse Practitioner  Urology & Medical Oncology      ONCOLOGIC HISTORY:     [name] is status post a robotic-assisted laparoscopic radical prostatectomy under the care of Dr. 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 There is 40% involvement. His pretreatment PSA was 6.2. He is status post external beam radiation under the care of Dr. Cathren Harsh here at [facility] as an adjuvant treatment from 05/29/2009 through 07/11/2009, total of 66.6 Gy.     His PSAs have been rising presenting a biochemical recurrence, for which he was on a surveillance program with anticiptation of delayed ADT    Scans 04/2017 due to sharp PSA rise (48)  Bone scan: New metastatic disease  CT AP: multiple incidetnals    Started Lupron 04/30/17  Started Abiraterone 1,000 mg and Prednisone 5 mg BID 08/07/17    Last scans: 04/2017    HISTORY OF PRESENT ILLNESS:     [name] is a very pleasant  African-American gentleman     His facial trauma is much better    He went to his local ED with severe cramps a few weeks ago  He wasn't taking his electrolye supps consistetnly  Since he has been, his cramps have been much better  He notes some mild-moderate joint pain    Otherwise, he is doing ok          Allergies   Allergen Reactions   ??? Penicillins      Other reaction(s): UNKNOWN     Meds: Reviewed EPIC    ROS: Pertinent + in HPI    Vitals:    10/02/17 1111   Pulse: 77   Resp: 16   Temp: 36.7 ??C (98 ??F)   SpO2: 100%       PHYSICAL EXAMINATION:    GENERAL: Pleasant, NAD.   VS: Reviewed  and unremarkable  Head: normocephalic  Eyes: sclera aniecteric. Trauma above left eye  Pulm: Non labored WOB  Skin: Warm, dry  Msk: No Le edema  NEURO: A&O x 3    Wt Readings from Last 6 Encounters:   10/02/17 73.7 kg (162 lb 6.4 oz)   09/18/17 74.7 kg (164 lb 11.2 oz)   09/13/17 74.4 kg (164 lb 0.4 oz)   07/30/17 75.7 kg (166 lb 14.4 oz)   07/04/17 73.2 kg (161 lb 6.4 oz)   05/13/17 72.6 kg (160 lb 0.9 oz)         MEDICAL DECISION MAKING:     Lab Results   Component Value Date    PSA 1.78 09/18/2017    PSA 2.32 09/11/2017    PSA 3.68 07/30/2017    PSA 4.47 (H) 07/04/2017    PSA 4.68 (H) 06/23/2017    PSA 48.80 (H) 04/30/2017       Lab Results   Component Value Date    PSADIAG 4.4 (H) 09/28/2014    PSADIAG 5.0 (H) 05/30/2014    PSADIAG 4.6 (H) 03/23/2014       Chemistry        Component Value Date/Time    NA 139 10/02/2017 1037    NA 140 02/20/2015 1121    K 4.7 10/02/2017 1037    K 3.5 02/20/2015 1121    CL 105 10/02/2017 1037    CL 104 02/20/2015 1121    CO2 20.0 (L) 10/02/2017 1037    CO2 23 02/20/2015 1121    BUN 20 10/02/2017 1037    BUN 13 02/20/2015 1121    CREATININE 1.13 10/02/2017 1037    CREATININE 1.04 02/20/2015 1121    CREATININE 1.4 07/25/2014 1123    CREATININE 1.6 (H) 07/25/2014 1118    GLU 80 10/02/2017 1037    GLU 112 02/20/2015 1121        Component Value Date/Time    CALCIUM 10.2 10/02/2017 1037    CALCIUM 8.4 (L) 02/20/2015 1121    ALKPHOS 73 10/02/2017 1037    AST 75 (H) 10/02/2017 1037    AST 38 09/11/2011 1637    ALT 44 10/02/2017 1037    ALT 31 09/11/2011 1637    BILITOT 0.5 10/02/2017 1037    BILITOT 0.5 09/11/2011 1637            CT Urogram: 04/22/17    Impression     - Bilateral renal cysts. No evidence for renal or ureteral mass. No renal calculi.  - Mild non-specific bladder wall irregularity, similar to prior.  - New, small sclerotic foci within the T11 and T12 vertebral bodies; osseous metastases cannot be excluded.  - Mildly prominent right pelvic sidewall lymph node is increased in size from prior, but not enlarged by size criteria. Attention on follow-up.  - Cholelithiasis.  - Hepatic steatosis.     Bone scan 04/29/17    Impression     1.Metastases in the proximal left proximal humeral shaft, T3 vertebral body and inferolateral margin of the left scapula suspected. Radiographic examination and/or CT examination could be considered, if clinically warranted, to confirm findings.

## 2017-10-02 NOTE — Unmapped (Incomplete)
AOC Triage Note     Patient: Joseph Phillips     Reason for call:  oxycodone RX    Time call returned: 1345     Phone Assessment: Pharmacy awaiting RX for oxycodone     Triage Recommendations: none     Patient Response: confirmed with pharmacist that RX was recieved     Outstanding tasks: none

## 2017-10-15 MED FILL — ZYTIGA/250MG/TAB: ZYTIGA/250MG/TAB | 30 days supply | Qty: 120 | Fill #2

## 2017-10-15 MED FILL — PREDNISONE/5MG/TABS: PREDNISONE/5MG/TABS | 30 days supply | Qty: 60 | Fill #2

## 2017-10-15 NOTE — Unmapped (Signed)
[facility] Refill Coordination Note  Specialty Medication(s): ZYTIGA  Additional Medications shipped: PREDNISONE    Wilburn Mylar, DOB: July 09, 1961  Phone: [phone] (home) , Alternate phone contact: N/A  Phone or address changes today?: No  All above HIPAA information was verified with patient.  Shipping Address: [address]  [place] [address]   Insurance changes? No    Completed refill call assessment today to schedule patient's medication shipment from the [facility] [phone]).      Confirmed the medication and dosage are correct and have not changed: Yes, regimen is correct and unchanged.    Confirmed patient started or stopped the following medications in the past month:  No, there are no changes reported at this time.    Are you tolerating your medication?:  [name] reports tolerating the medication.    ADHERENCE    Did you miss any doses in the past 4 weeks? No missed doses reported.    FINANCIAL/SHIPPING    Delivery Scheduled: Yes, Expected medication delivery date: 10/17/17     [name] did not have any additional questions at this time.    Delivery address validated in FSI scheduling system: Yes, address listed in FSI is correct.    We will follow up with patient monthly for standard refill processing and delivery.      Thank you,  Marletta Lor   [facility] Shared [facility] Specialty Pharmacist

## 2017-10-16 ENCOUNTER — Ambulatory Visit
Admission: RE | Admit: 2017-10-16 | Discharge: 2017-10-16 | Disposition: A | Discharge status: Home / Self Care | Payer: MEDICAID | Admitting: Nurse Practitioner

## 2017-10-16 ENCOUNTER — Ambulatory Visit
Admission: RE | Admit: 2017-10-16 | Discharge: 2017-10-16 | Disposition: A | Discharge status: Home / Self Care | Payer: MEDICAID

## 2017-10-16 DIAGNOSIS — C7951 Secondary malignant neoplasm of bone: Secondary | ICD-10-CM

## 2017-10-16 DIAGNOSIS — E876 Hypokalemia: Secondary | ICD-10-CM

## 2017-10-16 DIAGNOSIS — C61 Malignant neoplasm of prostate: Principal | ICD-10-CM

## 2017-10-16 LAB — COMPREHENSIVE METABOLIC PANEL
ALKALINE PHOSPHATASE: 96 U/L (ref 38–126)
ALT (SGPT): 28 U/L (ref 19–72)
ANION GAP: 14 mmol/L (ref 9–15)
AST (SGOT): 36 U/L (ref 19–55)
BILIRUBIN TOTAL: 0.5 mg/dL (ref 0.0–1.2)
BLOOD UREA NITROGEN: 19 mg/dL (ref 7–21)
BUN / CREAT RATIO: 21
CALCIUM: 9.8 mg/dL (ref 8.5–10.2)
CHLORIDE: 107 mmol/L (ref 98–107)
CO2: 25 mmol/L (ref 22.0–30.0)
CREATININE: 0.92 mg/dL (ref 0.70–1.30)
EGFR MDRD AF AMER: >=60 mL/min/{1.73_m2} (ref >=60)
GLUCOSE RANDOM: 94 mg/dL (ref 65–179)
POTASSIUM: 4.1 mmol/L (ref 3.5–5.0)
PROTEIN TOTAL: 7.7 g/dL (ref 6.5–8.3)
SODIUM: 146 mmol/L — ABNORMAL HIGH (ref 135–145)

## 2017-10-16 LAB — BLOOD UREA NITROGEN: Urea nitrogen:MCnc:Pt:Ser/Plas:Qn:: 19

## 2017-10-16 LAB — MAGNESIUM: Magnesium:MCnc:Pt:Ser/Plas:Qn:: 1.6

## 2017-10-16 MED ORDER — OXYCODONE 5 MG TABLET
ORAL_TABLET | Freq: Two times a day (BID) | ORAL | 0 refills | 0 days | Status: CP | PRN
Start: 2017-10-16 — End: 2017-10-31

## 2017-10-16 NOTE — Unmapped (Addendum)
??   Keep magnesium the same; 1 pill two times/day  ?? Keep taking your vitamin E, B Complex  ?? Take potassium 3 pills in the morning and 2 pills in the evening  ?? Keep taking your cancer medicine  ?? I will send a message to the social worker  ?? I will send in some pain medicine. Take only if needed. Drink plenty of water and eat fiber  ?? Meet with Alinda Money about the special testing on your tumor      [name], NP-C, OCN  Adult Nurse Practitioner  Urology and Medical Oncology    If you have been prescribed a medication today, always read the package insert that comes with the medication.    In the event of an emergency, always call 911    Urology:  Main clinic & [facility]: [phone]  Fax: [phone]    Cancer Hospital:  Phone: [phone]  Fax: [phone]    After hours/nights/weekends:  Hospital Operator: [phone]    [email or web address]  [email or web address]

## 2017-10-16 NOTE — Unmapped (Signed)
Lab drawn and sent for analysis.

## 2017-10-16 NOTE — Unmapped (Signed)
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 Prostate cancer:    ?? Lupron due again on or after 10/30/17  ?? Continue Abiraterone and prednisone.   ?? Come back in 2 weeks for CMP and mag check (need to continue every 2 week lab monitoring until at least 3 months on abi), + CBC, PSA, Lupron  ?? I will send a message to the social worker who he spoke with already to see about gas cards  ?? He met with our research team today about STRATA      2. Hypokalemia    ?? Continue KDur;  60 mEq qam/40 mEq qpm    3. Hypomagnesimia    ?? Continue mag ox BID    4. Cramps    ?? Increase water intake  ?? Nightly vitamin E; continue vitamin B complex  ?? Better since he has consistently been taking his electrolyte supplements        RTC 2 weeks as above    Of note, he does not have a working phone right now, so we can call his brother, [name], at [phone]        Approximately 25 minutes was spent in the care of this patient, half in education and counseling.  A copy of the AVS was provided to the patient which includes key points from our visit as well as our contact information.      [name], NP-C  Adult Nurse Practitioner  Urology & Medical Oncology      ONCOLOGIC HISTORY:     [name] is status post a robotic-assisted laparoscopic radical prostatectomy under the care of Dr. 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 There is 40% involvement. His pretreatment PSA was 6.2. He is status post external beam radiation under the care of Dr. Cathren Harsh here at [facility] as an adjuvant treatment from 05/29/2009 through 07/11/2009, total of 66.6 Gy.     His PSAs have been rising presenting a biochemical recurrence, for which he was on a surveillance program with anticiptation of delayed ADT    Scans 04/2017 due to sharp PSA rise (48)  Bone scan: New metastatic disease  CT AP: multiple incidetnals    Started Lupron 04/30/17  Started Abiraterone 1,000 mg and Prednisone 5 mg BID 08/07/17    Last scans: 04/2017    HISTORY OF PRESENT ILLNESS:     [name] is a very pleasant  African-American gentleman     He has been compliant with his electrolyte supps  Since he has been, his cramps have been much better  He notes some mild-moderate joint pain    Otherwise, he is doing ok. He would like to talk to a Child psychotherapist with whom he spoke about gas cards.           Allergies   Allergen Reactions   ??? Penicillins      Other reaction(s): UNKNOWN     Meds: Reviewed EPIC    ROS: Pertinent + in HPI    Vitals:    10/16/17 1204   BP: 140/79   Pulse: 77   Resp: 18   Temp: 36.5 ??C (97.7 ??F)   SpO2: 100%  PHYSICAL EXAMINATION:    GENERAL: Pleasant, NAD.   VS: Reviewed and unremarkable  Head: normocephalic  Eyes: sclera aniecteric.   Pulm: Non labored WOB  Skin: Warm, dry  Msk: No Le edema  NEURO: A&O x 3    Wt Readings from Last 6 Encounters:   10/16/17 73.9 kg (162 lb 14.4 oz)   10/02/17 73.7 kg (162 lb 6.4 oz)   09/18/17 74.7 kg (164 lb 11.2 oz)   09/13/17 74.4 kg (164 lb 0.4 oz)   07/30/17 75.7 kg (166 lb 14.4 oz)   07/04/17 73.2 kg (161 lb 6.4 oz)         MEDICAL DECISION MAKING:     Lab Results   Component Value Date    PSA 1.66 10/02/2017    PSA 1.78 09/18/2017    PSA 2.32 09/11/2017    PSA 3.68 07/30/2017    PSA 4.47 (H) 07/04/2017    PSA 4.68 (H) 06/23/2017       Lab Results   Component Value Date    PSADIAG 4.4 (H) 09/28/2014    PSADIAG 5.0 (H) 05/30/2014    PSADIAG 4.6 (H) 03/23/2014       Chemistry        Component Value Date/Time    NA 146 (H) 10/16/2017 1048    NA 140 02/20/2015 1121    K 4.1 10/16/2017 1048    K 3.5 02/20/2015 1121    CL 107 10/16/2017 1048    CL 104 02/20/2015 1121    CO2 25.0 10/16/2017 1048    CO2 23 02/20/2015 1121    BUN 19 10/16/2017 1048    BUN 13 02/20/2015 1121 CREATININE 0.92 10/16/2017 1048    CREATININE 1.04 02/20/2015 1121    CREATININE 1.4 07/25/2014 1123    CREATININE 1.6 (H) 07/25/2014 1118    GLU 94 10/16/2017 1048    GLU 112 02/20/2015 1121        Component Value Date/Time    CALCIUM 9.8 10/16/2017 1048    CALCIUM 8.4 (L) 02/20/2015 1121    ALKPHOS 96 10/16/2017 1048    AST 36 10/16/2017 1048    AST 38 09/11/2011 1637    ALT 28 10/16/2017 1048    ALT 31 09/11/2011 1637    BILITOT 0.5 10/16/2017 1048    BILITOT 0.5 09/11/2011 1637            CT Urogram: 04/22/17    Impression     - Bilateral renal cysts. No evidence for renal or ureteral mass. No renal calculi.  - Mild non-specific bladder wall irregularity, similar to prior.  - New, small sclerotic foci within the T11 and T12 vertebral bodies; osseous metastases cannot be excluded.  - Mildly prominent right pelvic sidewall lymph node is increased in size from prior, but not enlarged by size criteria. Attention on follow-up.  - Cholelithiasis.  - Hepatic steatosis.     Bone scan 04/29/17    Impression     1.Metastases in the proximal left proximal humeral shaft, T3 vertebral body and inferolateral margin of the left scapula suspected. Radiographic examination and/or CT examination could be considered, if clinically warranted, to confirm findings.

## 2017-10-30 ENCOUNTER — Ambulatory Visit
Admission: RE | Admit: 2017-10-30 | Discharge: 2017-10-30 | Disposition: A | Discharge status: Home / Self Care | Payer: MEDICAID

## 2017-10-30 DIAGNOSIS — E876 Hypokalemia: Secondary | ICD-10-CM

## 2017-10-30 DIAGNOSIS — C61 Malignant neoplasm of prostate: Principal | ICD-10-CM

## 2017-10-30 LAB — CBC W/ AUTO DIFF
EOSINOPHILS ABSOLUTE COUNT: 0 10*9/L (ref 0.0–0.4)
HEMATOCRIT: 32.3 % — ABNORMAL LOW (ref 41.0–53.0)
HEMOGLOBIN: 11.4 g/dL — ABNORMAL LOW (ref 13.5–17.5)
LARGE UNSTAINED CELLS: 1 % (ref 0–4)
LYMPHOCYTES ABSOLUTE COUNT: 1 10*9/L — ABNORMAL LOW (ref 1.5–5.0)
MEAN CORPUSCULAR HEMOGLOBIN CONC: 35.4 g/dL (ref 31.0–37.0)
MEAN CORPUSCULAR HEMOGLOBIN: 34.2 pg — ABNORMAL HIGH (ref 26.0–34.0)
MEAN PLATELET VOLUME: 7.9 fL (ref 7.0–10.0)
NEUTROPHILS ABSOLUTE COUNT: 7.2 10*9/L (ref 2.0–7.5)
PLATELET COUNT: 213 10*9/L (ref 150–440)
RED CELL DISTRIBUTION WIDTH: 13.5 % (ref 12.0–15.0)
WBC ADJUSTED: 8.6 10*9/L (ref 4.5–11.0)

## 2017-10-30 LAB — PROSTATE SPECIFIC ANTIGEN: Prostate specific Ag:MCnc:Pt:Ser/Plas:Qn:: 0.97

## 2017-10-30 LAB — COMPREHENSIVE METABOLIC PANEL
ALKALINE PHOSPHATASE: 108 U/L (ref 38–126)
ALT (SGPT): 30 U/L (ref 19–72)
ANION GAP: 13 mmol/L (ref 9–15)
BILIRUBIN TOTAL: 0.7 mg/dL (ref 0.0–1.2)
BLOOD UREA NITROGEN: 13 mg/dL (ref 7–21)
BUN / CREAT RATIO: 14
CALCIUM: 10.3 mg/dL — ABNORMAL HIGH (ref 8.5–10.2)
CHLORIDE: 104 mmol/L (ref 98–107)
CO2: 24 mmol/L (ref 22.0–30.0)
CREATININE: 0.92 mg/dL (ref 0.70–1.30)
EGFR MDRD AF AMER: >=60 mL/min/{1.73_m2} (ref >=60)
EGFR MDRD NON AF AMER: >=60 mL/min/{1.73_m2} (ref >=60)
GLUCOSE RANDOM: 129 mg/dL (ref 65–179)
POTASSIUM: 3.6 mmol/L (ref 3.5–5.0)
PROTEIN TOTAL: 7.3 g/dL (ref 6.5–8.3)
SODIUM: 141 mmol/L (ref 135–145)

## 2017-10-30 LAB — CO2: Carbon dioxide:SCnc:Pt:Ser/Plas:Qn:: 24

## 2017-10-30 LAB — BASOPHILS ABSOLUTE COUNT: Lab: 0

## 2017-10-30 LAB — PSA, DIAGNOSTIC: PROSTATE SPECIFIC ANTIGEN: 0.97 ng/mL (ref 0.00–4.00)

## 2017-10-30 LAB — MAGNESIUM: Magnesium:MCnc:Pt:Ser/Plas:Qn:: 1.4 — ABNORMAL LOW

## 2017-10-30 NOTE — Unmapped (Addendum)
??   Lower your cancer medicine to 3 pills/day instead of 4  ?? Keep magnesium the same; 1 pill two times/day  ?? Keep taking your vitamin E, B Complex  ?? Take potassium 3 pills in the morning and 2 pills in the evening  ?? Only take the strong pain medicine for severe pain  ?? Lupron today, again in 3 months      [name], NP-C, OCN  Adult Nurse Practitioner  Urology and Medical Oncology    If you have been prescribed a medication today, always read the package insert that comes with the medication.    In the event of an emergency, always call 911    Urology:  Main clinic & [facility]: [phone]  Fax: [phone]    Cancer Hospital:  Phone: [phone]  Fax: [phone]    After hours/nights/weekends:  Hospital Operator: [phone]    [email or web address]  [email or web address]

## 2017-10-30 NOTE — Unmapped (Signed)
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 Prostate cancer:    ?? Lupron due again on or after 01/30/18  ?? Continue Abiraterone and prednisone, but dose reduce abi to 3 tabs/day to see if this helps with  myalgias  ?? Come back in a few weeks for labs, clinic visit  ?? He has been consented for STRATA    Nice psa response      2. Hypokalemia    ?? Continue KDur;  60 mEq qam/40 mEq qpm    3. Hypomagnesimia    ?? Continue mag ox BID    4. Cramps    ?? Increase water intake  ?? Nightly vitamin E; continue vitamin B complex  ?? Better since he has consistently been taking his electrolyte supplements        Approximately 25 minutes was spent in the care of this patient, half in education and counseling.  A copy of the AVS was provided to the patient which includes key points from our visit as well as our contact information.      [name], NP-C  Adult Nurse Practitioner  Urology & Medical Oncology      ONCOLOGIC HISTORY:     [name] is status post a robotic-assisted laparoscopic radical prostatectomy under the care of Dr. 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 There is 40% involvement. His pretreatment PSA was 6.2. He is status post external beam radiation under the care of Dr. Cathren Harsh here at [facility] as an adjuvant treatment from 05/29/2009 through 07/11/2009, total of 66.6 Gy.     His PSAs have been rising presenting a biochemical recurrence, for which he was on a surveillance program with anticiptation of delayed ADT    Scans 04/2017 due to sharp PSA rise (48)  Bone scan: New metastatic disease  CT AP: multiple incidetnals    Started Lupron 04/30/17  Started Abiraterone 1,000 mg and Prednisone 5 mg BID 08/07/17    Last scans: 04/2017    HISTORY OF PRESENT ILLNESS:     [name] is a very pleasant  African-American gentleman     He has been compliant with his electrolyte supps  Since he has been, his cramps have been much better, without muscle jerks  He notes some mild-moderate arthralgias/myalgias pain    Otherwise, he is doing ok.        Allergies   Allergen Reactions   ??? Penicillins      Other reaction(s): UNKNOWN     Meds: Reviewed EPIC    ROS: Pertinent + in HPI    Vitals:    10/30/17 1231   BP: 149/77   Pulse: 88   Resp: 16   Temp: 37 ??C (98.6 ??F)   SpO2: 100%       PHYSICAL EXAMINATION:    GENERAL: Pleasant, NAD.   VS: Reviewed and unremarkable  Head: normocephalic  Eyes: sclera aniecteric.   Pulm: Non labored WOB  Skin: Warm, dry  Msk: No Le edema  NEURO: A&O x 3    Wt Readings from  Last 6 Encounters:   10/30/17 73.6 kg (162 lb 3.2 oz)   10/16/17 73.9 kg (162 lb 14.4 oz)   10/02/17 73.7 kg (162 lb 6.4 oz)   09/18/17 74.7 kg (164 lb 11.2 oz)   09/13/17 74.4 kg (164 lb 0.4 oz)   07/30/17 75.7 kg (166 lb 14.4 oz)         MEDICAL DECISION MAKING:     Lab Results   Component Value Date    PSA 0.97 10/30/2017    PSA 1.66 10/02/2017    PSA 1.78 09/18/2017    PSA 2.32 09/11/2017    PSA 3.68 07/30/2017    PSA 4.47 (H) 07/04/2017       Lab Results   Component Value Date    PSADIAG 4.4 (H) 09/28/2014    PSADIAG 5.0 (H) 05/30/2014    PSADIAG 4.6 (H) 03/23/2014       Chemistry        Component Value Date/Time    NA 141 10/30/2017 1132    NA 140 02/20/2015 1121    K 3.6 10/30/2017 1132    K 3.5 02/20/2015 1121    CL 104 10/30/2017 1132    CL 104 02/20/2015 1121    CO2 24.0 10/30/2017 1132    CO2 23 02/20/2015 1121    BUN 13 10/30/2017 1132    BUN 13 02/20/2015 1121    CREATININE 0.92 10/30/2017 1132    CREATININE 1.04 02/20/2015 1121    CREATININE 1.4 07/25/2014 1123    CREATININE 1.6 (H) 07/25/2014 1118    GLU 129 10/30/2017 1132    GLU 112 02/20/2015 1121 Component Value Date/Time    CALCIUM 10.3 (H) 10/30/2017 1132    CALCIUM 8.4 (L) 02/20/2015 1121    ALKPHOS 108 10/30/2017 1132    AST 44 10/30/2017 1132    AST 38 09/11/2011 1637    ALT 30 10/30/2017 1132    ALT 31 09/11/2011 1637    BILITOT 0.7 10/30/2017 1132    BILITOT 0.5 09/11/2011 1637            CT Urogram: 04/22/17    Impression     - Bilateral renal cysts. No evidence for renal or ureteral mass. No renal calculi.  - Mild non-specific bladder wall irregularity, similar to prior.  - New, small sclerotic foci within the T11 and T12 vertebral bodies; osseous metastases cannot be excluded.  - Mildly prominent right pelvic sidewall lymph node is increased in size from prior, but not enlarged by size criteria. Attention on follow-up.  - Cholelithiasis.  - Hepatic steatosis.     Bone scan 04/29/17    Impression     1.Metastases in the proximal left proximal humeral shaft, T3 vertebral body and inferolateral margin of the left scapula suspected. Radiographic examination and/or CT examination could be considered, if clinically warranted, to confirm findings.

## 2017-10-31 MED ORDER — OXYCODONE 5 MG TABLET
ORAL_TABLET | Freq: Two times a day (BID) | ORAL | 0 refills | 0 days | Status: CP | PRN
Start: 2017-10-31 — End: 2017-11-15

## 2017-10-31 NOTE — Unmapped (Signed)
AOC Triage Note     Patient: Joseph Phillips     Reason for call:  Oxycodone    Time call returned: 1010     Phone Assessment: Pt states he discussed with [name] at yesterday's appt and was supposed to have the roxicodone sent in. He is requesting it to be sent to [facility].      Triage Recommendations: n/a     Patient Response: n/a     Outstanding tasks: Please send if appropriate. Thanks.

## 2017-10-31 NOTE — Unmapped (Signed)
Outpatient SW Note - Telephone Call  SW contacted patient on this date, discussed needs and concerns at length, re; Pt would like to apply for Medicare and has financial assistance needs. SW educated patient about Medicare and the procedure for becoming eligible. SW provided patient with phone number for social security administration so he could call for more information [phone]. Pt states that he has financial issues as his rent is too high because it is based on his father living with him, but his father is in a nursing home now. He would like to get signed up with [employer] or get his rent lowered based on his income. Pt reports he receives about $750 month social security income.    SW discussed above issue with patient, discussed at length plan for advocacy and education. SW will meet with patient at next visit.  SW available for further consult as needed.

## 2017-10-31 NOTE — Unmapped (Signed)
Taken care of, thanks.

## 2017-10-31 NOTE — Unmapped (Signed)
-----   Message from Elon Spanner sent at 10/31/2017  8:34 AM EST -----  Regarding: Medication refill  Contact: [phone]  Patient called stating he needs a refill on his oxyCODONE (ROXICODONE) 5 MG immediate release tablet , but the pharmacy told him they did not receive a refill request.    Please call [name] @ [phone].      Thanks in advance,  Elon Spanner  [facility] Cancer Communication Center  [phone]

## 2017-11-11 MED ORDER — ABIRATERONE 250 MG TABLET: each | 11 refills | 0 days

## 2017-11-11 MED ORDER — ABIRATERONE 250 MG TABLET: 1000 mg | tablet | Freq: Every day | 11 refills | 0 days | Status: AC

## 2017-11-11 MED ORDER — ABIRATERONE 250 MG TABLET
ORAL_TABLET | Freq: Every day | ORAL | 11 refills | 0.00000 days | Status: CP
Start: 2017-11-11 — End: 2018-06-29

## 2017-11-11 MED ORDER — ZYTIGA 250 MG TABLET
11 refills | 0 days
Start: 2017-11-11 — End: 2017-11-11

## 2017-11-11 NOTE — Unmapped (Signed)
[facility] Refill Coordination Note  Specialty Medication(s): Zytiga 250mg   Additional Medications shipped: Prednisone 5mg     Joseph Phillips, DOB: 11/19/1961  Phone: [phone] (home) , Alternate phone contact: N/A  Phone or address changes today?: No  All above HIPAA information was verified with patient's family member. [facility] Joseph Phillips)  Shipping Address: [address]  [place] [address]   Insurance changes? No    Completed refill call assessment today to schedule patient's medication shipment from the [facility] [phone]).      Confirmed the medication and dosage are correct and have not changed: No, patient reports changes to the regimen as follows: Now taking Zytiga 750mg  daily    Confirmed patient started or stopped the following medications in the past month:  No, there are no changes reported at this time.    Are you tolerating your medication?:  Joseph Phillips reports tolerating the medication.    ADHERENCE    Is this medicine transplant or covered by Medicare Part B? No.        Did you miss any doses in the past 4 weeks? No missed doses reported.    FINANCIAL/SHIPPING    Delivery Scheduled: 11/18/17.  Sent request to provider for new rx.     Joseph Phillips did not have any additional questions at this time.    Delivery address validated in FSI scheduling system: Yes, address listed in FSI is correct.    We will follow up with patient monthly for standard refill processing and delivery.      Thank you,  [employer]   [facility] Shared [facility] Specialty Pharmacist

## 2017-11-14 MED FILL — ZYTIGA/250MG/TAB: ZYTIGA/250MG/TAB | 30 days supply | Qty: 90 | Fill #0

## 2017-11-14 MED FILL — PREDNISONE/5MG/TABS: PREDNISONE/5MG/TABS | 30 days supply | Qty: 60 | Fill #3

## 2017-11-24 ENCOUNTER — Ambulatory Visit
Admission: RE | Admit: 2017-11-24 | Discharge: 2017-11-24 | Disposition: A | Discharge status: Home / Self Care | Payer: MEDICAID | Admitting: Nurse Practitioner

## 2017-11-24 ENCOUNTER — Ambulatory Visit
Admission: RE | Admit: 2017-11-24 | Discharge: 2017-11-24 | Disposition: A | Discharge status: Home / Self Care | Payer: MEDICAID

## 2017-11-24 DIAGNOSIS — C61 Malignant neoplasm of prostate: Principal | ICD-10-CM

## 2017-11-24 DIAGNOSIS — E876 Hypokalemia: Secondary | ICD-10-CM

## 2017-11-24 LAB — CBC W/ AUTO DIFF
BASOPHILS ABSOLUTE COUNT: 0 10*9/L (ref 0.0–0.1)
HEMATOCRIT: 33.4 % — ABNORMAL LOW (ref 41.0–53.0)
HEMOGLOBIN: 11.8 g/dL — ABNORMAL LOW (ref 13.5–17.5)
LARGE UNSTAINED CELLS: 2 % (ref 0–4)
LYMPHOCYTES ABSOLUTE COUNT: 1.3 10*9/L — ABNORMAL LOW (ref 1.5–5.0)
MEAN CORPUSCULAR HEMOGLOBIN CONC: 35.4 g/dL (ref 31.0–37.0)
MEAN CORPUSCULAR HEMOGLOBIN: 35.1 pg — ABNORMAL HIGH (ref 26.0–34.0)
MEAN PLATELET VOLUME: 7.6 fL (ref 7.0–10.0)
MONOCYTES ABSOLUTE COUNT: 0.4 10*9/L (ref 0.2–0.8)
NEUTROPHILS ABSOLUTE COUNT: 4.3 10*9/L (ref 2.0–7.5)
PLATELET COUNT: 246 10*9/L (ref 150–440)
RED BLOOD CELL COUNT: 3.37 10*12/L — ABNORMAL LOW (ref 4.50–5.90)
RED CELL DISTRIBUTION WIDTH: 14.9 % (ref 12.0–15.0)
WBC ADJUSTED: 6.2 10*9/L (ref 4.5–11.0)

## 2017-11-24 LAB — COMPREHENSIVE METABOLIC PANEL
ALBUMIN: 4.4 g/dL (ref 3.5–5.0)
ALKALINE PHOSPHATASE: 76 U/L (ref 38–126)
ALT (SGPT): 28 U/L (ref 19–72)
ANION GAP: 13 mmol/L (ref 9–15)
AST (SGOT): 39 U/L (ref 19–55)
BILIRUBIN TOTAL: 0.6 mg/dL (ref 0.0–1.2)
BLOOD UREA NITROGEN: 15 mg/dL (ref 7–21)
BUN / CREAT RATIO: 17
CALCIUM: 9.2 mg/dL (ref 8.5–10.2)
CHLORIDE: 101 mmol/L (ref 98–107)
CO2: 26 mmol/L (ref 22.0–30.0)
EGFR MDRD NON AF AMER: >=60 mL/min/{1.73_m2} (ref >=60)
POTASSIUM: 3.7 mmol/L (ref 3.5–5.0)
PROTEIN TOTAL: 7.7 g/dL (ref 6.5–8.3)
SODIUM: 140 mmol/L (ref 135–145)

## 2017-11-24 LAB — SODIUM: Sodium:SCnc:Pt:Ser/Plas:Qn:: 140

## 2017-11-24 LAB — PROSTATE SPECIFIC ANTIGEN: Prostate specific Ag:MCnc:Pt:Ser/Plas:Qn:: 0.94

## 2017-11-24 LAB — MACROCYTES

## 2017-11-24 LAB — MAGNESIUM: Magnesium:MCnc:Pt:Ser/Plas:Qn:: 1.5 — ABNORMAL LOW

## 2017-11-24 NOTE — Unmapped (Addendum)
??   Make an appointment to see your primary doctor to check on the leg cramps  ?? Lupron due in February  ?? Keep taking your cancer medicine 3 pills/day  ?? Keep magnesium the same; 1 pill two times/day  ?? Keep taking your vitamin E, B Complex  ?? Take potassium 3 pills in the morning and 2 pills in the evening    [name], NP-C, OCN  Adult Nurse Practitioner  Urology and Medical Oncology    If you have been prescribed a medication today, always read the package insert that comes with the medication.    In the event of an emergency, always call 911    Urology:  Main clinic & [facility]: [phone]  Fax: [phone]    Cancer Hospital:  Phone: [phone]  Fax: [phone]    After hours/nights/weekends:  Hospital Operator: [phone]    [email or web address]  [email or web address]

## 2017-11-24 NOTE — Unmapped (Signed)
Lab drawn and sent for analysis, care provided by Eppie Gibson RN

## 2017-11-24 NOTE — Unmapped (Signed)
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 Prostate cancer:    ?? Lupron due again on or after 01/30/18  ?? Continue Abiraterone and prednisone, at a dose reduction abi to 3 tabs/day to see if this helps with myalgias  ?? Come back in a month for labs, clinic visit  ?? He has been consented for STRATA    Nice psa response      2. Hypokalemia    ?? Continue KDur;  60 mEq qam/40 mEq qpm    3. Hypomagnesimia    ?? Continue mag ox BID    4. Cramps/leg pain    ?? Increase water intake  ?? Nightly vitamin E; continue vitamin B complex  ?? Better since he has consistently been taking his electrolyte supplements  ?? Will talk to Katina Degree about management strategies that do not involve opioids. He says ibuprofen causes GI upset.   ?? Onset prior to to starting abiraterone, so I don't think it's the only culprit. F/u with PCP to explore other etiologies        Approximately 25 minutes was spent in the care of this patient, half in education and counseling.  A copy of the AVS was provided to the patient which includes key points from our visit as well as our contact information.      [name], NP-C  Adult Nurse Practitioner  Urology & Medical Oncology      ONCOLOGIC HISTORY:     [name] is status post a robotic-assisted laparoscopic radical prostatectomy under the care of Dr. 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 There is 40% involvement. His pretreatment PSA was 6.2. He is status post external beam radiation under the care of Dr. Cathren Harsh here at [facility] as an adjuvant treatment from 05/29/2009 through 07/11/2009, total of 66.6 Gy.     His PSAs have been rising presenting a biochemical recurrence, for which he was on a surveillance program with anticiptation of delayed ADT    Scans 04/2017 due to sharp PSA rise (48)  Bone scan: New metastatic disease  CT AP: multiple incidentals    Started Lupron 04/30/17  Started Abiraterone 1,000 mg and Prednisone 5 mg BID 08/07/17    Last scans: 04/2017    HISTORY OF PRESENT ILLNESS:     [name] is a very pleasant  African-American gentleman     He has been compliant with his electrolyte supps  His cramps have been much better, without muscle jerks  He notes some mild-moderate arthralgias/myalgias, mainly in his legs. The cramps and this pain predated his start of abiraterone    Otherwise, he is doing ok.  He met with one of our social workers today      Allergies   Allergen Reactions   ??? Penicillins      Other reaction(s): UNKNOWN     Meds: Reviewed EPIC    ROS: Pertinent + in HPI    Vitals:    11/24/17 0855   BP: 146/86   Pulse: 99   Resp: 16  Temp: 36.6 ??C (97.8 ??F)   SpO2: 100%       PHYSICAL EXAMINATION:    GENERAL: Pleasant, NAD.   VS: Reviewed and unremarkable  Head: normocephalic  Eyes: sclera aniecteric.   Pulm: Non labored WOB  Skin: Warm, dry  Msk: No Le edema  NEURO: A&O x 3    Wt Readings from Last 6 Encounters:   11/24/17 75.1 kg (165 lb 9.6 oz)   10/30/17 73.6 kg (162 lb 3.2 oz)   10/16/17 73.9 kg (162 lb 14.4 oz)   10/02/17 73.7 kg (162 lb 6.4 oz)   09/18/17 74.7 kg (164 lb 11.2 oz)   09/13/17 74.4 kg (164 lb 0.4 oz)         MEDICAL DECISION MAKING:     Lab Results   Component Value Date    PSA 0.94 11/24/2017    PSA 0.97 10/30/2017    PSA 1.66 10/02/2017    PSA 1.78 09/18/2017    PSA 2.32 09/11/2017    PSA 3.68 07/30/2017       Lab Results   Component Value Date    PSADIAG 4.4 (H) 09/28/2014    PSADIAG 5.0 (H) 05/30/2014    PSADIAG 4.6 (H) 03/23/2014       Chemistry        Component Value Date/Time    NA 140 11/24/2017 0728    NA 140 02/20/2015 1121    K 3.7 11/24/2017 0728    K 3.5 02/20/2015 1121    CL 101 11/24/2017 0728    CL 104 02/20/2015 1121    CO2 26.0 11/24/2017 0728    CO2 23 02/20/2015 1121    BUN 15 11/24/2017 0728    BUN 13 02/20/2015 1121    CREATININE 0.86 11/24/2017 0728    CREATININE 1.04 02/20/2015 1121    CREATININE 1.4 07/25/2014 1123    CREATININE 1.6 (H) 07/25/2014 1118    GLU 126 11/24/2017 0728    GLU 112 02/20/2015 1121        Component Value Date/Time    CALCIUM 9.2 11/24/2017 0728    CALCIUM 8.4 (L) 02/20/2015 1121    ALKPHOS 76 11/24/2017 0728    AST 39 11/24/2017 0728    AST 38 09/11/2011 1637    ALT 28 11/24/2017 0728    ALT 31 09/11/2011 1637    BILITOT 0.6 11/24/2017 0728    BILITOT 0.5 09/11/2011 1637            CT Urogram: 04/22/17    Impression     - Bilateral renal cysts. No evidence for renal or ureteral mass. No renal calculi.  - Mild non-specific bladder wall irregularity, similar to prior.  - New, small sclerotic foci within the T11 and T12 vertebral bodies; osseous metastases cannot be excluded.  - Mildly prominent right pelvic sidewall lymph node is increased in size from prior, but not enlarged by size criteria. Attention on follow-up.  - Cholelithiasis.  - Hepatic steatosis.     Bone scan 04/29/17    Impression     1.Metastases in the proximal left proximal humeral shaft, T3 vertebral body and inferolateral margin of the left scapula suspected. Radiographic examination and/or CT examination could be considered, if clinically warranted, to confirm findings.

## 2017-11-25 NOTE — Unmapped (Signed)
Spoke with Joseph Phillips- discussed taking tylenol for leg pain. Joseph Phillips will call me at the end of the week to let me know if tylenol has helped.

## 2017-11-25 NOTE — Unmapped (Signed)
-----   Message from Chancy Hurter, ANP sent at 11/24/2017  5:05 PM EST -----  Regarding: tylenol  Hi  Can you touch based with him and let him know that I spoke to [facility], and she suggested scheduled Tylenol for his legs. He can start with 1,000mg  (2 extra strength) every 12 hours and let us know how that helps  thanks

## 2017-12-09 NOTE — Unmapped (Signed)
[facility] Refill Coordination Note  Specialty Medication(s): zytiga  Additional Medications shipped: prenisone    Joseph Phillips, DOB: [birth year]  Phone: [phone] (home) , Alternate phone contact: N/A  Phone or address changes today?: No  All above HIPAA information was verified with patient.  Shipping Address: [address]  [place] [address]   Insurance changes? No    Completed refill call assessment today to schedule patient's medication shipment from the [facility] [phone]).      Confirmed the medication and dosage are correct and have not changed: Yes, regimen is correct and unchanged.    Confirmed patient started or stopped the following medications in the past month:  No, there are no changes reported at this time.    Are you tolerating your medication?:  Joseph Phillips reports tolerating the medication.    ADHERENCE    Did you miss any doses in the past 4 weeks? No missed doses reported.    FINANCIAL/SHIPPING    Delivery Scheduled: Yes, Expected medication delivery date: 12/12/17     Joseph Phillips did not have any additional questions at this time.    Delivery address validated in [facility] scheduling system: Yes, address listed in [facility] is correct.    We will follow up with patient monthly for standard refill processing and delivery.      Thank you,  Marletta Lor   [facility] Specialty Pharmacist

## 2017-12-11 MED FILL — PREDNISONE/5MG/TABS: PREDNISONE/5MG/TABS | 30 days supply | Qty: 60 | Fill #4

## 2017-12-11 MED FILL — ZYTIGA/250MG/TAB: ZYTIGA/250MG/TAB | 30 days supply | Qty: 90 | Fill #1

## 2017-12-14 ENCOUNTER — Emergency Department: Payer: No Typology Code available for payment source

## 2017-12-14 ENCOUNTER — Encounter: Payer: Self-pay | Admitting: Emergency Medicine

## 2017-12-14 ENCOUNTER — Other Ambulatory Visit: Payer: Self-pay

## 2017-12-14 ENCOUNTER — Emergency Department
Admission: EM | Admit: 2017-12-14 | Discharge: 2017-12-14 | Disposition: A | Discharge status: Home / Self Care | Payer: No Typology Code available for payment source | Attending: Emergency Medicine | Admitting: Emergency Medicine

## 2017-12-14 DIAGNOSIS — S20212A Contusion of left front wall of thorax, initial encounter: Secondary | ICD-10-CM | POA: Diagnosis not present

## 2017-12-14 DIAGNOSIS — S0990XA Unspecified injury of head, initial encounter: Secondary | ICD-10-CM | POA: Insufficient documentation

## 2017-12-14 DIAGNOSIS — Y9389 Activity, other specified: Secondary | ICD-10-CM | POA: Insufficient documentation

## 2017-12-14 DIAGNOSIS — Y92410 Unspecified street and highway as the place of occurrence of the external cause: Secondary | ICD-10-CM | POA: Insufficient documentation

## 2017-12-14 DIAGNOSIS — W2210XA Striking against or struck by unspecified automobile airbag, initial encounter: Secondary | ICD-10-CM | POA: Insufficient documentation

## 2017-12-14 DIAGNOSIS — I10 Essential (primary) hypertension: Secondary | ICD-10-CM | POA: Insufficient documentation

## 2017-12-14 DIAGNOSIS — R079 Chest pain, unspecified: Secondary | ICD-10-CM

## 2017-12-14 DIAGNOSIS — Y999 Unspecified external cause status: Secondary | ICD-10-CM | POA: Insufficient documentation

## 2017-12-14 DIAGNOSIS — S299XXA Unspecified injury of thorax, initial encounter: Secondary | ICD-10-CM | POA: Diagnosis present

## 2017-12-14 DIAGNOSIS — F17200 Nicotine dependence, unspecified, uncomplicated: Secondary | ICD-10-CM | POA: Diagnosis not present

## 2017-12-14 HISTORY — DX: Essential (primary) hypertension: I10

## 2017-12-14 HISTORY — DX: Acute pancreatitis without necrosis or infection, unspecified: K85.90

## 2017-12-14 LAB — CBC
HEMATOCRIT: 33.1 % — AB (ref 40.0–52.0)
HEMOGLOBIN: 11.5 g/dL — AB (ref 13.0–18.0)
MCH: 35.7 pg — ABNORMAL HIGH (ref 26.0–34.0)
MCHC: 34.8 g/dL (ref 32.0–36.0)
MCV: 102.6 fL — AB (ref 80.0–100.0)
Platelets: 182 10*3/uL (ref 150–440)
RBC: 3.23 MIL/uL — AB (ref 4.40–5.90)
RDW: 14.1 % (ref 11.5–14.5)
WBC: 7.1 10*3/uL (ref 3.8–10.6)

## 2017-12-14 LAB — BASIC METABOLIC PANEL
ANION GAP: 9 (ref 5–15)
BUN: 11 mg/dL (ref 6–20)
CHLORIDE: 108 mmol/L (ref 101–111)
CO2: 22 mmol/L (ref 22–32)
Calcium: 8.8 mg/dL — ABNORMAL LOW (ref 8.9–10.3)
Creatinine, Ser: 0.83 mg/dL (ref 0.61–1.24)
Glucose, Bld: 99 mg/dL (ref 65–99)
POTASSIUM: 3.4 mmol/L — AB (ref 3.5–5.1)
SODIUM: 139 mmol/L (ref 135–145)

## 2017-12-14 LAB — TROPONIN I: Troponin I: <0.03 ng/mL (ref <0.03)

## 2017-12-14 MED ORDER — ONDANSETRON HCL 4 MG/2ML IJ SOLN
4.0000 mg | Freq: Once | INTRAMUSCULAR | Status: AC
  Administered 2017-12-14: 4 mg via INTRAVENOUS
  Filled 2017-12-14: qty 2

## 2017-12-14 MED ORDER — OXYCODONE HCL 5 MG PO TABS: 5.0000 mg | ORAL_TABLET | Freq: Four times a day (QID) | ORAL | 0 refills | Status: DC | PRN

## 2017-12-14 MED ORDER — MORPHINE SULFATE (PF) 4 MG/ML IV SOLN
4.0000 mg | Freq: Once | INTRAVENOUS | Status: AC
  Administered 2017-12-14: 4 mg via INTRAVENOUS
  Filled 2017-12-14: qty 1

## 2017-12-14 MED ORDER — MORPHINE SULFATE (PF) 2 MG/ML IV SOLN
2.0000 mg | Freq: Once | INTRAVENOUS | Status: AC
  Administered 2017-12-14: 2 mg via INTRAVENOUS
  Filled 2017-12-14: qty 1

## 2017-12-14 MED ORDER — IOPAMIDOL (ISOVUE-300) INJECTION 61%
75.0000 mL | Freq: Once | INTRAVENOUS | Status: AC | PRN
  Administered 2017-12-14: 75 mL via INTRAVENOUS
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
## 2017-12-14 NOTE — ED Notes (Signed)
XR at bedside

## 2017-12-14 NOTE — ED Notes (Signed)
Pt VS per EMS, O2 98%, RR18, PR 90, and BP 140/80.
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
## 2018-01-01 ENCOUNTER — Other Ambulatory Visit: Admit: 2018-01-01 | Discharge: 2018-01-02 | Payer: MEDICAID

## 2018-01-01 ENCOUNTER — Ambulatory Visit
Admit: 2018-01-01 | Discharge: 2018-01-02 | Payer: MEDICAID | Attending: Nurse Practitioner | Primary: Nurse Practitioner

## 2018-01-01 DIAGNOSIS — C61 Malignant neoplasm of prostate: Principal | ICD-10-CM

## 2018-01-01 DIAGNOSIS — E876 Hypokalemia: Secondary | ICD-10-CM

## 2018-01-01 LAB — CBC W/ AUTO DIFF
BASOPHILS ABSOLUTE COUNT: 0.1 10*9/L (ref 0.0–0.1)
EOSINOPHILS ABSOLUTE COUNT: 0.1 10*9/L (ref 0.0–0.4)
HEMOGLOBIN: 13.5 g/dL (ref 13.5–17.5)
LARGE UNSTAINED CELLS: 2 % (ref 0–4)
LYMPHOCYTES ABSOLUTE COUNT: 2.4 10*9/L (ref 1.5–5.0)
MEAN CORPUSCULAR HEMOGLOBIN CONC: 35.7 g/dL (ref 31.0–37.0)
MEAN CORPUSCULAR HEMOGLOBIN: 35.8 pg — ABNORMAL HIGH (ref 26.0–34.0)
MEAN CORPUSCULAR VOLUME: 100.2 fL — ABNORMAL HIGH (ref 80.0–100.0)
MEAN PLATELET VOLUME: 7.4 fL (ref 7.0–10.0)
MONOCYTES ABSOLUTE COUNT: 0.9 10*9/L — ABNORMAL HIGH (ref 0.2–0.8)
RED BLOOD CELL COUNT: 3.77 10*12/L — ABNORMAL LOW (ref 4.50–5.90)
RED CELL DISTRIBUTION WIDTH: 13.5 % (ref 12.0–15.0)
WBC ADJUSTED: 10.6 10*9/L (ref 4.5–11.0)

## 2018-01-01 LAB — COMPREHENSIVE METABOLIC PANEL
ALBUMIN: 4.5 g/dL (ref 3.5–5.0)
ALKALINE PHOSPHATASE: 74 U/L (ref 38–126)
ANION GAP: 12 mmol/L (ref 9–15)
AST (SGOT): 35 U/L (ref 19–55)
BILIRUBIN TOTAL: 0.6 mg/dL (ref 0.0–1.2)
BLOOD UREA NITROGEN: 14 mg/dL (ref 7–21)
BUN / CREAT RATIO: 14
CALCIUM: 9.9 mg/dL (ref 8.5–10.2)
CHLORIDE: 104 mmol/L (ref 98–107)
CREATININE: 0.97 mg/dL (ref 0.70–1.30)
EGFR MDRD AF AMER: >=60 mL/min/{1.73_m2} (ref >=60)
EGFR MDRD NON AF AMER: >=60 mL/min/{1.73_m2} (ref >=60)
GLUCOSE RANDOM: 114 mg/dL (ref 65–179)
POTASSIUM: 3.2 mmol/L — ABNORMAL LOW (ref 3.5–5.0)
PROTEIN TOTAL: 7.6 g/dL (ref 6.5–8.3)
SODIUM: 141 mmol/L (ref 135–145)

## 2018-01-01 LAB — MEAN CORPUSCULAR VOLUME: Lab: 100.2 — ABNORMAL HIGH

## 2018-01-01 LAB — PROSTATE SPECIFIC ANTIGEN: Prostate specific Ag:MCnc:Pt:Ser/Plas:Qn:: 0.82

## 2018-01-01 LAB — MAGNESIUM: Magnesium:MCnc:Pt:Ser/Plas:Qn:: 1.4 — ABNORMAL LOW

## 2018-01-01 LAB — ALT (SGPT): Alanine aminotransferase:CCnc:Pt:Ser/Plas:Qn:: 21

## 2018-01-01 MED ORDER — NAPROXEN 500 MG TABLET
ORAL_TABLET | Freq: Two times a day (BID) | ORAL | 3 refills | 0.00000 days | Status: CP
Start: 2018-01-01 — End: 2018-06-29
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
## 2018-01-01 NOTE — Unmapped (Signed)
[facility] Refill Coordination Note  Specialty Medication(s): Zytiga 250mg , Prednisone 5mg       Joseph Phillips, DOB: 1960/12/27  Phone: [phone] (home) [phone] (work), Alternate phone contact: N/A  Phone or address changes today?: No  All above HIPAA information was verified with patient.  Shipping Address: [address]  [place] [address]   Insurance changes? No    Completed refill call assessment today to schedule patient's medication shipment from the [facility] [phone]).      Confirmed the medication and dosage are correct and have not changed: Yes, regimen is correct and unchanged.    Confirmed patient started or stopped the following medications in the past month:  No, there are no changes reported at this time.    Are you tolerating your medication?:  Joseph Phillips reports tolerating the medication.    ADHERENCE    Is this medicine transplant or covered by Medicare Part B? No.        Did you miss any doses in the past 4 weeks? No missed doses reported.    FINANCIAL/SHIPPING    Delivery Scheduled: Yes, Expected medication delivery date: 01/07/18     Joseph Phillips did not have any additional questions at this time.    Delivery address validated in FSI scheduling system: Yes, address listed in FSI is correct.    We will follow up with patient monthly for standard refill processing and delivery.      Thank you,  [employer]   [facility] Specialty Pharmacist

## 2018-01-01 NOTE — Unmapped (Signed)
Clinical Pharmacist Practitioner: Genitourinary Clinic    Oral Chemotherapy Follow-Up    Assessment and recommendations:  1) Abiraterone Therapy Monitoring: patient is tolerating the medication well; no reported issues.  -Continue abiraterone 750 mg daily     Follow- up: Return to see Joseph Phillips, ANP on 01/29/2018    ______________________________________________________________________    Oral Chemotherapy: abiraterone 750 mg daily w/prednisone + Lupron  Start date: 07/2017    HPI: 64 YOM w/ metastatic castrate-sensitive prostate cancer patient, currently taking abiraterone + prednisone and leuprolide.     Interim History: [name], reports feeling well today and has had no issues with his therapy. He still reports having hot flashes, but they are not significant and do not influence his daily activities.     Adherence: Good-reports missing no doses. He takes 3 tablets a day on an empty stomach.     Drug-Drug Interactions: none       Medications:  Current Outpatient Prescriptions   Medication Sig Dispense Refill   ??? abiraterone (ZYTIGA) 250 mg Tab tablet Take 3 tablets (750 mg total) by mouth daily. 120 tablet 11   ??? acetaminophen (TYLENOL) 500 MG tablet Take 500 mg by mouth. Take 1 PO TID, with each dose of naproxen sodium. Dose: 500MG      ??? amLODIPine (NORVASC) 10 MG tablet Take 1 tablet (10 mg total) by mouth daily. 90 tablet 3   ??? b complex vitamins capsule Take 1 capsule by mouth daily. 30 capsule 6   ??? busPIRone (BUSPAR) 5 MG tablet Take 1 tablet (5 mg total) by mouth Three (3) times a day. 270 tablet 3   ??? hydroCHLOROthiazide (HYDRODIURIL) 25 MG tablet Take 1 tablet (25 mg total) by mouth daily. 90 tablet 3   ??? magnesium oxide (MAGOX) 400 mg (241.3 mg magnesium) tablet Take 1 tablet (400 mg total) by mouth Two (2) times a day. 60 tablet 6   ??? omeprazole (PRILOSEC) 20 MG capsule Take 2 capsules (40 mg total) by mouth daily. 180 capsule 3   ??? pancrelipase, Lip-Prot-Amyl, (CREON) 24,000-76,000 -120,000 unit CpDR Take 1 capsule (24,000 units of lipase total) by mouth Three (3) times a day with a meal. Take 2 caps with meals, 1 cap with snacks 180 capsule 3   ??? potassium chloride SA (K-DUR,KLOR-CON) 20 MEQ tablet Take 3 tablets (60 mEq total) by mouth Two (2) times a day. 180 tablet 0   ??? predniSONE (DELTASONE) 5 MG tablet Take 1 tablet (5 mg total) by mouth Two (2) times a day. 60 tablet 11   ??? tiotropium (SPIRIVA WITH HANDIHALER) 18 mcg inhalation capsule Place 1 capsule (18 mcg total) into inhaler and inhale once daily. 90 capsule 3   ??? traZODone (DESYREL) 100 MG tablet Take 0.5 tablets (50 mg total) by mouth nightly as needed for sleep. 180 tablet 0   ??? triamcinolone (KENALOG) 0.1 % ointment Apply to thin areas of rash twice daily as needed 454 g 1   ??? vitamin E 400 UNIT capsule Take 2 capsules (800 Units total) by mouth nightly. 60 capsule 6   ??? naproxen (NAPROSYN) 500 MG tablet Take 1 tablet (500 mg total) by mouth 2 (two) times a day with meals. 60 tablet 3     No current facility-administered medications for this visit.        I spent 15 minutes with Joseph Phillips in direct patient care.     Joseph Phillips, PharmD, MSPS  PGY2 Hematology/Oncology Pharmacy Resident  Pager: [phone]

## 2018-01-01 NOTE — Unmapped (Addendum)
??   Make an appointment to see your primary doctor to check on the leg cramps  ?? Lupron due in February  ?? Keep taking your cancer medicine 3 pills/day  ?? Keep magnesium the same; 1 pill two times/day  ?? Keep taking your vitamin E, B Complex  ?? Take potassium 3 pills in the morning and 3 pills in the evening (this is a change)  ?? Prescription sent in for Naproxen for pain. Take 1 pill with breakfast and 1 pill with dinner  ?? I will reach out to Sharlet Salina to ask him to touch base with you about the disability and light bill    Joseph Parr, NP-C, OCN  Adult Nurse Practitioner  Urology and Medical Oncology    If you have been prescribed a medication today, always read the package insert that comes with the medication.    In the event of an emergency, always call 911    Urology:  Main clinic & [facility]: [phone]  Fax: [phone]    Cancer Hospital:  Phone: [phone]  Fax: [phone]    After hours/nights/weekends:  Hospital Operator: [phone]    [email or web address]  [email or web address]

## 2018-01-01 NOTE — Unmapped (Signed)
Venipuncture RAC, labs drawn and sent.  To MD appt.

## 2018-01-05 NOTE — Unmapped (Signed)
SW left vm for patient at phone listed in chart, re: Answer questions about application for disabiilty.

## 2018-01-06 MED FILL — PREDNISONE/5MG/TABS: PREDNISONE/5MG/TABS | 30 days supply | Qty: 60 | Fill #5

## 2018-01-06 MED FILL — ZYTIGA/250MG/TAB: ZYTIGA/250MG/TAB | 30 days supply | Qty: 90 | Fill #2

## 2018-02-10 NOTE — Unmapped (Signed)
I called Joseph Phillips and he said he has missed some doses.  He has been upset due to his father passing.  I will call him back in a week to schedule his Zytiga/Prednisone refills.

## 2018-02-17 NOTE — Unmapped (Signed)
[facility] Refill Coordination Note  Specialty Medication(s): Zytiga 250mg , Prednisone 5mg       Joseph Phillips, DOB: [birth year]  Phone: [phone] (work), Alternate phone contact: N/A  Phone or address changes today?: No  All above HIPAA information was verified with patient.  Shipping Address: [address]  [place] [address]   Insurance changes? No    Completed refill call assessment today to schedule patient's medication shipment from the [facility] [phone]).      Confirmed the medication and dosage are correct and have not changed: Yes, regimen is correct and unchanged.    Confirmed patient started or stopped the following medications in the past month:  No, there are no changes reported at this time.    Are you tolerating your medication?:  Joseph Phillips reports tolerating the medication.    ADHERENCE    Is this medicine transplant or covered by Medicare Part B? No.        Did you miss any doses in the past 4 weeks? No missed doses reported.    FINANCIAL/SHIPPING    Delivery Scheduled: Yes, Expected medication delivery date: 02/19/18     The patient will receive an FSI print out for each medication shipped and additional FDA Medication Guides as required.  Patient education from [place] or [employer]et Leu may also be included in the shipment.    Joseph Phillips did not have any additional questions at this time.    Delivery address validated in FSI scheduling system: Yes, address listed in FSI is correct.    We will follow up with patient monthly for standard refill processing and delivery.      Thank you,  [employer]   [facility] Specialty Pharmacist

## 2018-02-18 MED FILL — ZYTIGA/250MG/TAB: ZYTIGA/250MG/TAB | 30 days supply | Qty: 90 | Fill #3

## 2018-02-18 MED FILL — PREDNISONE/5MG/TABS: PREDNISONE/5MG/TABS | 30 days supply | Qty: 60 | Fill #6

## 2018-02-26 ENCOUNTER — Other Ambulatory Visit: Admit: 2018-02-26 | Discharge: 2018-02-26 | Payer: MEDICAID

## 2018-02-26 DIAGNOSIS — C61 Malignant neoplasm of prostate: Principal | ICD-10-CM

## 2018-02-26 LAB — CBC W/ AUTO DIFF
BASOPHILS ABSOLUTE COUNT: 0 10*9/L (ref 0.0–0.1)
EOSINOPHILS RELATIVE PERCENT: 0.4 %
HEMATOCRIT: 34.7 % — ABNORMAL LOW (ref 41.0–53.0)
HEMOGLOBIN: 12.1 g/dL — ABNORMAL LOW (ref 13.5–17.5)
LARGE UNSTAINED CELLS: 1 % (ref 0–4)
LYMPHOCYTES ABSOLUTE COUNT: 1.2 10*9/L — ABNORMAL LOW (ref 1.5–5.0)
LYMPHOCYTES RELATIVE PERCENT: 12.4 %
MEAN CORPUSCULAR HEMOGLOBIN CONC: 34.9 g/dL (ref 31.0–37.0)
MEAN CORPUSCULAR HEMOGLOBIN: 36.8 pg — ABNORMAL HIGH (ref 26.0–34.0)
MEAN CORPUSCULAR VOLUME: 105.2 fL — ABNORMAL HIGH (ref 80.0–100.0)
MEAN PLATELET VOLUME: 7.7 fL (ref 7.0–10.0)
MONOCYTES ABSOLUTE COUNT: 0.5 10*9/L (ref 0.2–0.8)
MONOCYTES RELATIVE PERCENT: 4.5 %
NEUTROPHILS ABSOLUTE COUNT: 8.1 10*9/L — ABNORMAL HIGH (ref 2.0–7.5)
NEUTROPHILS RELATIVE PERCENT: 81.1 %
PLATELET COUNT: 262 10*9/L (ref 150–440)
RED BLOOD CELL COUNT: 3.3 10*12/L — ABNORMAL LOW (ref 4.50–5.90)
RED CELL DISTRIBUTION WIDTH: 14.5 % (ref 12.0–15.0)
WBC ADJUSTED: 9.9 10*9/L (ref 4.5–11.0)

## 2018-02-26 LAB — COMPREHENSIVE METABOLIC PANEL
ALKALINE PHOSPHATASE: 78 U/L (ref 38–126)
ALT (SGPT): 16 U/L — ABNORMAL LOW (ref 19–72)
ANION GAP: 8 mmol/L — ABNORMAL LOW (ref 9–15)
AST (SGOT): 32 U/L (ref 19–55)
BLOOD UREA NITROGEN: 16 mg/dL (ref 7–21)
BUN / CREAT RATIO: 19
CALCIUM: 9.5 mg/dL (ref 8.5–10.2)
CHLORIDE: 105 mmol/L (ref 98–107)
CO2: 27 mmol/L (ref 22.0–30.0)
CREATININE: 0.83 mg/dL (ref 0.70–1.30)
EGFR MDRD AF AMER: >=60 mL/min/{1.73_m2} (ref >=60)
EGFR MDRD NON AF AMER: >=60 mL/min/{1.73_m2} (ref >=60)
GLUCOSE RANDOM: 86 mg/dL (ref 65–179)
POTASSIUM: 4.3 mmol/L (ref 3.5–5.0)
PROTEIN TOTAL: 6.7 g/dL (ref 6.5–8.3)
SODIUM: 140 mmol/L (ref 135–145)

## 2018-02-26 LAB — MAGNESIUM: Magnesium:MCnc:Pt:Ser/Plas:Qn:: 1.6

## 2018-02-26 LAB — SMEAR REVIEW

## 2018-02-26 LAB — PROSTATE SPECIFIC ANTIGEN: Prostate specific Ag:MCnc:Pt:Ser/Plas:Qn:: 0.9

## 2018-02-26 LAB — BUN / CREAT RATIO: Urea nitrogen/Creatinine:MRto:Pt:Ser/Plas:Qn:: 19

## 2018-02-26 LAB — WBC ADJUSTED: Lab: 9.9

## 2018-02-26 NOTE — Unmapped (Signed)
Labs drawn and sent for analysis.  Care provided by  Y Cheek.

## 2018-03-02 NOTE — Unmapped (Signed)
This encounter was created in error - please disregard.

## 2018-03-09 NOTE — Unmapped (Signed)
[facility] Refill Coordination Note  Specialty Medication(s): Zytiga 250mg   Additional Medications shipped: Prednisone    Joseph Phillips, DOB: 07/20/1961  Phone: [phone] (home) [phone] (work), Alternate phone contact: N/A  Phone or address changes today?: No  All above HIPAA information was verified with patient.  Shipping Address: [address]  [place] [address]   Insurance changes? No    Completed refill call assessment today to schedule patient's medication shipment from the [facility] [phone]).      Confirmed the medication and dosage are correct and have not changed: Yes, regimen is correct and unchanged.    Confirmed patient started or stopped the following medications in the past month:  Yes. Joseph Phillips reports starting the following medications: Naproxen    Are you tolerating your medication?:  Joseph Phillips reports tolerating the medication.    ADHERENCE    Is this medicine transplant or covered by Medicare Part B? No.        Did you miss any doses in the past 4 weeks? Yes.  Joseph Phillips reports missing 2-3 days of medication therapy in the last 4 weeks.  Joseph Phillips reports father passing away as the cause of their non-adherance.    FINANCIAL/SHIPPING    Delivery Scheduled: Yes, Expected medication delivery date: 03/19/18     The patient will receive an FSI print out for each medication shipped and additional FDA Medication Guides as required.  Patient education from [place] or [employer] Leu may also be included in the shipment.    Joseph Phillips did not have any additional questions at this time.    Delivery address validated in FSI scheduling system: Yes, address listed in FSI is correct.    We will follow up with patient monthly for standard refill processing and delivery.      Thank you,  Joseph Phillips Joseph Phillips   [facility] Specialty Pharmacist

## 2018-03-16 MED ORDER — TRAZODONE 100 MG TABLET
ORAL_TABLET | 1 refills | 0 days | Status: CP
Start: 2018-03-16 — End: 2018-11-16

## 2018-03-16 NOTE — Unmapped (Deleted)
Internal Medicine Clinic Visit    Reason for Visit: Follow-up    Assessment/Plan:     1. Prostate cancer: Previously treated with surgery and radiation, recently with up trending PSA. Following with urology, has significant amount of anxiety related to this.  - Recommend that oncology counselor for should be considered at his next urology visit    2. Reflux esophagitis:  - refilled omeprazole    3. Essential hypertension: normotensive today  - refilled amlodipine, HCTZ  - HTN 3 today    4. Chronic obstructive pulmonary disease  - refilled Spiriva    5. Alcohol-induced chronic pancreatitis  - refilled Creon, 1 capsule TID with meals    6. Anxiety/Sleep difficulties  - refilled buspirone, trazodone    7. Healthcare maintenance  - deferred due to limited time at today's visit, will return to clinic on 5/9  __________________________________________________________    HPI:    57 year old male past medical history is below presenting to clinic for follow-up.    Patient was last seen in clinic 02/2016, has not been back he states because he has been taking care of his sick father. Along with this, he has been out of all of his medications since then. He is very distraught today as he recently learned that his PSA is increasing and he has been having on pain, all consistent with a recurrence of his prostate cancer. He is currently following with urology in regards to this. In spite of his high-level of anxiety and fear, he denies any current suicidal or homicidal ideation.    He generally states that he has not been feeling well and attributes this been off of all of his medications. He describes bone pain in his legs and hips states that he has been just feeling badly all the time. Denies any chest pain, shortness of breath.  __________________________________________________________    Problem List:  Patient Active Problem List   Diagnosis   ??? 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 Acute pancreatitis   ??? Depressive disorder   ??? Reflux esophagitis   ??? Encounter for long-term (current) use of other medications   ??? Pain medication agreement broken   ??? Do not give narcotics   ??? PPD screening test   ??? Tobacco use disorder   ??? Prostate cancer (CMS-HCC)   ??? Hx of radiation therapy   ??? Healthcare maintenance   ??? Sleep difficulties   ??? Essential hypertension   ??? Anxiety   ??? Hypokalemia   ??? Hypomagnesemia       Medications: Reviewed in Epic  __________________________________________________________    Physical Exam:   Vital Signs:  There were no vitals filed for this visit.    Gen: Male, anxious appearing, NAD  ENT: MMM, poor dentition  Neck: No cervical LAD  CV: RRR, no murmurs  Pulm: Normal effort, CTAB  Abd: Soft, NTND  Ext: Warm, No edema noted  Skin: No rashes

## 2018-03-18 MED FILL — PREDNISONE/5MG/TABS: PREDNISONE/5MG/TABS | 30 days supply | Qty: 60 | Fill #7

## 2018-03-18 MED FILL — ZYTIGA/250MG/TAB: ZYTIGA/250MG/TAB | 30 days supply | Qty: 90 | Fill #4

## 2018-03-26 ENCOUNTER — Other Ambulatory Visit: Admit: 2018-03-26 | Discharge: 2018-03-27 | Payer: MEDICAID

## 2018-03-26 ENCOUNTER — Ambulatory Visit
Admit: 2018-03-26 | Discharge: 2018-03-27 | Payer: MEDICAID | Attending: Nurse Practitioner | Primary: Nurse Practitioner

## 2018-03-26 DIAGNOSIS — E876 Hypokalemia: Principal | ICD-10-CM

## 2018-03-26 DIAGNOSIS — F419 Anxiety disorder, unspecified: Secondary | ICD-10-CM

## 2018-03-26 DIAGNOSIS — C61 Malignant neoplasm of prostate: Secondary | ICD-10-CM

## 2018-03-26 DIAGNOSIS — B0229 Other postherpetic nervous system involvement: Secondary | ICD-10-CM

## 2018-03-26 LAB — CBC W/ AUTO DIFF
BASOPHILS ABSOLUTE COUNT: 0 10*9/L (ref 0.0–0.1)
BASOPHILS RELATIVE PERCENT: 0.7 %
EOSINOPHILS RELATIVE PERCENT: 0.5 %
HEMATOCRIT: 32.2 % — ABNORMAL LOW (ref 41.0–53.0)
HEMOGLOBIN: 11.6 g/dL — ABNORMAL LOW (ref 13.5–17.5)
LARGE UNSTAINED CELLS: 3 % (ref 0–4)
LYMPHOCYTES ABSOLUTE COUNT: 1 10*9/L — ABNORMAL LOW (ref 1.5–5.0)
LYMPHOCYTES RELATIVE PERCENT: 15.4 %
MEAN CORPUSCULAR HEMOGLOBIN CONC: 36 g/dL (ref 31.0–37.0)
MEAN CORPUSCULAR HEMOGLOBIN: 36.2 pg — ABNORMAL HIGH (ref 26.0–34.0)
MEAN CORPUSCULAR VOLUME: 100.5 fL — ABNORMAL HIGH (ref 80.0–100.0)
MEAN PLATELET VOLUME: 7.6 fL (ref 7.0–10.0)
MONOCYTES RELATIVE PERCENT: 6.4 %
NEUTROPHILS ABSOLUTE COUNT: 4.7 10*9/L (ref 2.0–7.5)
NEUTROPHILS RELATIVE PERCENT: 74.1 %
PLATELET COUNT: 228 10*9/L (ref 150–440)
RED BLOOD CELL COUNT: 3.21 10*12/L — ABNORMAL LOW (ref 4.50–5.90)
RED CELL DISTRIBUTION WIDTH: 13.6 % (ref 12.0–15.0)
WBC ADJUSTED: 6.3 10*9/L (ref 4.5–11.0)

## 2018-03-26 LAB — COMPREHENSIVE METABOLIC PANEL
ALBUMIN: 3.8 g/dL (ref 3.5–5.0)
ALKALINE PHOSPHATASE: 77 U/L (ref 38–126)
ALT (SGPT): 25 U/L (ref 19–72)
ANION GAP: 8 mmol/L — ABNORMAL LOW (ref 9–15)
AST (SGOT): 37 U/L (ref 19–55)
BILIRUBIN TOTAL: 0.5 mg/dL (ref 0.0–1.2)
BLOOD UREA NITROGEN: 17 mg/dL (ref 7–21)
BUN / CREAT RATIO: 20
CALCIUM: 9.3 mg/dL (ref 8.5–10.2)
CHLORIDE: 107 mmol/L (ref 98–107)
CO2: 25 mmol/L (ref 22.0–30.0)
CREATININE: 0.86 mg/dL (ref 0.70–1.30)
EGFR MDRD AF AMER: >=60 mL/min/{1.73_m2} (ref >=60)
EGFR MDRD NON AF AMER: >=60 mL/min/{1.73_m2} (ref >=60)
POTASSIUM: 3 mmol/L — ABNORMAL LOW (ref 3.5–5.0)
PROTEIN TOTAL: 6.9 g/dL (ref 6.5–8.3)
SODIUM: 140 mmol/L (ref 135–145)

## 2018-03-26 LAB — LYMPHOCYTES ABSOLUTE COUNT: Lab: 1 — ABNORMAL LOW

## 2018-03-26 LAB — SMEAR REVIEW: Lab: 0

## 2018-03-26 LAB — PROSTATE SPECIFIC ANTIGEN: Prostate specific Ag:MCnc:Pt:Ser/Plas:Qn:: 1.38

## 2018-03-26 LAB — AST (SGOT): Aspartate aminotransferase:CCnc:Pt:Ser/Plas:Qn:: 37

## 2018-03-26 LAB — MAGNESIUM: Magnesium:MCnc:Pt:Ser/Plas:Qn:: 1.5 — ABNORMAL LOW

## 2018-03-26 MED ORDER — BUSPIRONE 5 MG TABLET
ORAL_TABLET | Freq: Three times a day (TID) | ORAL | 3 refills | 0 days | Status: CP
Start: 2018-03-26 — End: 2019-04-16

## 2018-03-26 MED ORDER — POTASSIUM CHLORIDE ER 20 MEQ TABLET,EXTENDED RELEASE(PART/CRYST)
ORAL_TABLET | Freq: Two times a day (BID) | ORAL | 3 refills | 0 days | Status: SS
Start: 2018-03-26 — End: 2018-11-12

## 2018-03-26 MED ORDER — VALACYCLOVIR 1 GRAM TABLET
ORAL_TABLET | Freq: Three times a day (TID) | ORAL | 0 refills | 0 days | Status: CP
Start: 2018-03-26 — End: 2018-04-05

## 2018-03-26 MED ORDER — OXYCODONE 5 MG TABLET
ORAL_TABLET | ORAL | 0 refills | 0 days | Status: CP | PRN
Start: 2018-03-26 — End: 2018-05-11

## 2018-03-26 NOTE — Unmapped (Signed)
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 Prostate cancer:    ?? Lupron due again on or after 06/25/18   ?? Continue Abiraterone and prednisone, at a dose reduction abi to 3 tabs/day to see if this helps at all with myalgias  ?? Come back in~ a month for labs, clinic visit  ?? Slight increase in PSA may be due to Lupron being late. Monitor for now  ?? He has been consented for STRATA    2. Hypokalemia    ?? Increase KDur back to 60 mEq qam/60 mEq qpm    3. Hypomagnesimia    ?? Continue mag ox BID    4. Cramps/leg pain    ?? Increase water intake  ?? Nightly vitamin E; continue vitamin B complex  ?? Better since he has consistently been taking his electrolyte supplements  ?? Onset prior to to starting abiraterone, so I don't think it's the only culprit. F/u with PCP to explore other etiologies  ?? Try naproxen w/ food      5. Herpes zoster    ?? Erx for valacyclovir 1gm TID x 10 days  ?? Erx Oxycodone 5 mg for severe pain due to acute neuritis      Approximately 25 minutes was spent in the care of this patient, half in education and counseling.  A copy of the AVS was provided to the patient which includes key points from our visit as well as our contact information.      Joseph Parr, NP-C  Adult Nurse Practitioner  Urology & Medical Oncology      ONCOLOGIC HISTORY:     [name] is status post a robotic-assisted laparoscopic radical prostatectomy under the care of Dr. Sinclair Phillips on 01/24/2009 for what proved to be a pT3a pNx Mx   adenocarcinoma of prostate extending out towards the left base into the fat, Gleason 3+4 equals 7.He did have a 3 mm positive margin along the left prostate base. There is 40% involvement. His pretreatment PSA was 6.2. He is status post external beam radiation under the care of Dr. Cathren Phillips here at [facility] as an adjuvant treatment from 05/29/2009 through 07/11/2009, total of 66.6 Gy.     His PSAs have been rising representing a biochemical recurrence, for which he was on a surveillance program with anticiptation of delayed ADT    Scans 04/2017 due to sharp PSA rise (48)  Bone scan: New metastatic disease  CT AP: multiple incidentals    Started Lupron 04/30/17  Started Abiraterone 1,000 mg and Prednisone 5 mg BID 08/07/17    Last scans: 04/2017    HISTORY OF PRESENT ILLNESS:     [name] is a very pleasant  African-American gentleman     Today, he reports acute onset of a rash on his left leg and lower back about 2 days ago.  He says the pain is so severe he can barely sleep.   Describes and painful and on fire  Started as blisters, which he started popping    He has been compliant with his electrolyte supps  His cramps have been  better, without muscle jerks  He notes some mild-moderate arthralgias/myalgias, mainly in his legs. The cramps and this pain predated his start of abiraterone. He  has been taking 1,000 mg of Tylenol TID and says this doesn't help. Does reducing the abi hasn't helped either. He has not seen his PCP to discuss other etiologies/management    He takes his abi/pred every day    Allergies   Allergen Reactions   ??? Penicillins      Other reaction(s): UNKNOWN     Meds: Reviewed EPIC    ROS: Pertinent + in HPI    Vitals:    03/26/18 1029   BP: 155/94   Pulse: 80   Resp: 18   Temp: 36.4 ??C (97.6 ??F)   SpO2: 99%       PHYSICAL EXAMINATION:    GENERAL: Pleasant, NAD.   VS: Reviewed and unremarkable  Head: normocephalic  Eyes: sclera aniecteric.   Pulm: Non labored WOB; CTA bilaterally  Card: RRR no MRG  Skin: Left upper leg (dermatome L2-L3) with several patches of vesicles. Lower back (dermatome S1) with a patch of vesicles. Photos in EPIC  Msk: No Le edema  NEURO: A&O x 3    Wt Readings from Last 6 Encounters:   03/26/18 73.8 kg (162 lb 12.8 oz)   11/24/17 75.1 kg (165 lb 9.6 oz)   10/30/17 73.6 kg (162 lb 3.2 oz)   10/16/17 73.9 kg (162 lb 14.4 oz)   10/02/17 73.7 kg (162 lb 6.4 oz)   09/18/17 74.7 kg (164 lb 11.2 oz)         MEDICAL DECISION MAKING:     Lab Results   Component Value Date    PSA 1.38 03/26/2018    PSA 0.90 02/26/2018    PSA 0.82 01/01/2018    PSA 0.94 11/24/2017    PSA 0.97 10/30/2017    PSA 1.66 10/02/2017       Lab Results   Component Value Date    PSADIAG 4.4 (H) 09/28/2014    PSADIAG 5.0 (H) 05/30/2014    PSADIAG 4.6 (H) 03/23/2014       Chemistry        Component Value Date/Time    NA 140 03/26/2018 0849    NA 140 02/20/2015 1121    K 3.0 (L) 03/26/2018 0849    K 3.5 02/20/2015 1121    CL 107 03/26/2018 0849    CL 104 02/20/2015 1121    CO2 25.0 03/26/2018 0849    CO2 23 02/20/2015 1121    BUN 17 03/26/2018 0849    BUN 13 02/20/2015 1121    CREATININE 0.86 03/26/2018 0849    CREATININE 1.04 02/20/2015 1121    CREATININE 1.4 07/25/2014 1123    CREATININE 1.6 (H) 07/25/2014 1118    GLU 126 03/26/2018 0849    GLU 112 02/20/2015 1121        Component Value Date/Time    CALCIUM 9.3 03/26/2018 0849    CALCIUM 8.4 (L) 02/20/2015 1121    ALKPHOS 77 03/26/2018 0849    AST 37 03/26/2018 0849    AST 38 09/11/2011 1637    ALT 25 03/26/2018 0849    ALT 31 09/11/2011 1637    BILITOT 0.5 03/26/2018 0849    BILITOT 0.5 09/11/2011 1637            CT Urogram: 04/22/17    Impression     - Bilateral renal cysts. No evidence for renal or ureteral mass. No renal calculi.  - Mild non-specific bladder wall irregularity, similar to prior.  - New, small sclerotic foci within the T11 and T12 vertebral bodies; osseous  metastases cannot be excluded.  - Mildly prominent right pelvic sidewall lymph node is increased in size from prior, but not enlarged by size criteria. Attention on follow-up.  - Cholelithiasis.  - Hepatic steatosis.     Bone scan 04/29/17    Impression     1.Metastases in the proximal left proximal humeral shaft, T3 vertebral body and inferolateral margin of the left scapula suspected. Radiographic examination and/or CT examination could be considered, if clinically warranted, to confirm findings.

## 2018-03-26 NOTE — Unmapped (Signed)
??   Will send in medicine for the shingles  ?? Potassium 3 pills in the morning and at night  ?? Keep taking your cancer medicine  ?? Come back in 4 weeks      [name], NP-C, OCN  Adult Nurse Practitioner  Urology and Medical Oncology    If you have been prescribed a medication today, always read the package insert that comes with the medication.    In the event of an emergency, always call 911    Urology:  Main clinic & [facility]: [phone]  Fax: [phone]    Cancer Hospital:  Phone: [phone]  Fax: [phone]    After hours/nights/weekends:  Hospital Operator: [phone]    [email or web address]  [email or web address]

## 2018-04-23 NOTE — Unmapped (Signed)
I called [name] to schedule his next  Refill of Zytiga and Prednisone.  He still has a month's supply of medicine.  I will call him again in a month.

## 2018-05-11 ENCOUNTER — Other Ambulatory Visit: Admit: 2018-05-11 | Discharge: 2018-05-12 | Payer: MEDICAID

## 2018-05-11 ENCOUNTER — Ambulatory Visit
Admit: 2018-05-11 | Discharge: 2018-05-12 | Payer: MEDICAID | Attending: Nurse Practitioner | Primary: Nurse Practitioner

## 2018-05-11 DIAGNOSIS — C61 Malignant neoplasm of prostate: Principal | ICD-10-CM

## 2018-05-11 DIAGNOSIS — I10 Essential (primary) hypertension: Principal | ICD-10-CM

## 2018-05-11 DIAGNOSIS — J449 Chronic obstructive pulmonary disease, unspecified: Secondary | ICD-10-CM

## 2018-05-11 DIAGNOSIS — R062 Wheezing: Secondary | ICD-10-CM

## 2018-05-11 LAB — COMPREHENSIVE METABOLIC PANEL
ALBUMIN: 4 g/dL (ref 3.5–5.0)
ALKALINE PHOSPHATASE: 70 U/L (ref 38–126)
ALT (SGPT): 26 U/L (ref 19–72)
ANION GAP: 12 mmol/L (ref 9–15)
AST (SGOT): 34 U/L (ref 19–55)
BILIRUBIN TOTAL: 0.8 mg/dL (ref 0.0–1.2)
BLOOD UREA NITROGEN: 18 mg/dL (ref 7–21)
BUN / CREAT RATIO: 17
CALCIUM: 9.5 mg/dL (ref 8.5–10.2)
CHLORIDE: 100 mmol/L (ref 98–107)
CO2: 25 mmol/L (ref 22.0–30.0)
CREATININE: 1.05 mg/dL (ref 0.70–1.30)
EGFR MDRD AF AMER: >=60 mL/min/{1.73_m2} (ref >=60)
EGFR MDRD NON AF AMER: >=60 mL/min/{1.73_m2} (ref >=60)
GLUCOSE RANDOM: 116 mg/dL (ref 65–179)
POTASSIUM: 3.8 mmol/L (ref 3.5–5.0)
PROTEIN TOTAL: 7.1 g/dL (ref 6.5–8.3)

## 2018-05-11 LAB — CBC W/ AUTO DIFF
BASOPHILS ABSOLUTE COUNT: 0 10*9/L (ref 0.0–0.1)
BASOPHILS RELATIVE PERCENT: 0.4 %
EOSINOPHILS ABSOLUTE COUNT: 0 10*9/L (ref 0.0–0.4)
EOSINOPHILS RELATIVE PERCENT: 0.4 %
HEMATOCRIT: 33.4 % — ABNORMAL LOW (ref 41.0–53.0)
HEMOGLOBIN: 11.9 g/dL — ABNORMAL LOW (ref 13.5–17.5)
LYMPHOCYTES ABSOLUTE COUNT: 0.9 10*9/L — ABNORMAL LOW (ref 1.5–5.0)
LYMPHOCYTES RELATIVE PERCENT: 10.5 %
MEAN CORPUSCULAR HEMOGLOBIN CONC: 35.7 g/dL (ref 31.0–37.0)
MEAN CORPUSCULAR HEMOGLOBIN: 37 pg — ABNORMAL HIGH (ref 26.0–34.0)
MEAN CORPUSCULAR VOLUME: 103.5 fL — ABNORMAL HIGH (ref 80.0–100.0)
MEAN PLATELET VOLUME: 7.8 fL (ref 7.0–10.0)
MONOCYTES ABSOLUTE COUNT: 0.5 10*9/L (ref 0.2–0.8)
MONOCYTES RELATIVE PERCENT: 6.1 %
NEUTROPHILS ABSOLUTE COUNT: 6.9 10*9/L (ref 2.0–7.5)
NEUTROPHILS RELATIVE PERCENT: 81.5 %
PLATELET COUNT: 237 10*9/L (ref 150–440)
RED BLOOD CELL COUNT: 3.23 10*12/L — ABNORMAL LOW (ref 4.50–5.90)
WBC ADJUSTED: 8.5 10*9/L (ref 4.5–11.0)

## 2018-05-11 LAB — BASOPHILS ABSOLUTE COUNT: Lab: 0

## 2018-05-11 LAB — SMEAR REVIEW

## 2018-05-11 LAB — PROSTATE SPECIFIC ANTIGEN: Prostate specific Ag:MCnc:Pt:Ser/Plas:Qn:: 2.55

## 2018-05-11 LAB — MAGNESIUM: Magnesium:MCnc:Pt:Ser/Plas:Qn:: 1.3 — ABNORMAL LOW

## 2018-05-11 LAB — ALBUMIN: Albumin:MCnc:Pt:Ser/Plas:Qn:: 4

## 2018-05-11 MED ORDER — TIOTROPIUM BROMIDE 18 MCG CAPSULE WITH INHALATION DEVICE
ORAL_CAPSULE | Freq: Every day | RESPIRATORY_TRACT | 3 refills | 0.00000 days | Status: CP
Start: 2018-05-11 — End: 2018-10-05

## 2018-05-11 MED ORDER — OXYCODONE 5 MG TABLET
ORAL_TABLET | Freq: Three times a day (TID) | ORAL | 0 refills | 0.00000 days | Status: CP | PRN
Start: 2018-05-11 — End: 2018-06-29

## 2018-05-11 MED ORDER — AMLODIPINE 10 MG TABLET
ORAL_TABLET | Freq: Every day | ORAL | 3 refills | 0 days | Status: CP
Start: 2018-05-11 — End: 2018-10-05

## 2018-05-11 NOTE — Unmapped (Signed)
Clinical Pharmacist Practitioner: Genitourinary Clinic    Oral Chemotherapy Follow-Up    Assessment and recommendations:  1. Abiraterone monitoring and side effect management- [name] reports doing OK with Zytiga, he still complains of myalgais and arthralgias. K 3.8 on 60 mEq BID, LFTs WNL, BP elevated today but missed morning meds and out of amlodipine. Has missed 2 doses of Zytiga in the past month. PSA pending  -Continue abiraterone 750 mg daily     2. Medication Management- [name] is having trouble with medication management. His brother currently helps him. His compliance for medications is poor. He missed 2 doses of Zytiga in the past month.   - Provided patient will pill box today    Follow- up: Return to see Joseph Phillips, ANP     ______________________________________________________________________    Oral Chemotherapy: abiraterone 750 mg daily w/prednisone + Lupron (dose reduced due to arthralgias)  Start date: 07/2017    HPI: 45 YOM w/ metastatic castrate-sensitive prostate cancer patient, currently taking abiraterone + prednisone and leuprolide.     Interim History: [name], reports feeling well today but continues to have myalgias and arthralgias. He has a lot of issues with medication management. We reviewed all of his medications today, he is out of many of them including amlodipine, tiotropium and was out of magnesium for 1 week. It seems he is having a hard time keeping up with his medications and seems a bit overwhelmed.     Adherence: He takes 3 tablets once daily in the morning on an empty stomach. He does report missing 2 doses in the past month and several in 2023-03-10 when his father passed away.     Drug-Drug Interactions: none       Medications:  Current Outpatient Medications   Medication Sig Dispense Refill   ??? abiraterone (ZYTIGA) 250 mg Tab tablet Take 3 tablets (750 mg total) by mouth daily. 120 tablet 11   ??? amLODIPine (NORVASC) 10 MG tablet Take 1 tablet (10 mg total) by mouth daily. 90 tablet 3   ??? b complex vitamins capsule Take 1 capsule by mouth daily. 30 capsule 6   ??? b complex vitamins tablet   0   ??? busPIRone (BUSPAR) 5 MG tablet Take 1 tablet (5 mg total) by mouth Three (3) times a day. 270 tablet 3   ??? hydroCHLOROthiazide (HYDRODIURIL) 25 MG tablet Take 1 tablet (25 mg total) by mouth daily. 90 tablet 3   ??? magnesium oxide (MAGOX) 400 mg (241.3 mg magnesium) tablet Take 1 tablet (400 mg total) by mouth Two (2) times a day. 60 tablet 6   ??? naproxen (NAPROSYN) 500 MG tablet Take 1 tablet (500 mg total) by mouth 2 (two) times a day with meals. 60 tablet 3   ??? omeprazole (PRILOSEC) 20 MG capsule Take 2 capsules (40 mg total) by mouth daily. 180 capsule 3   ??? oxyCODONE (ROXICODONE) 5 MG immediate release tablet Take 1 tablet (5 mg total) by mouth every four (4) hours as needed for pain. 30 tablet 0   ??? pancrelipase, Lip-Prot-Amyl, (CREON) 24,000-76,000 -120,000 unit CpDR Take 1 capsule (24,000 units of lipase total) by mouth Three (3) times a day with a meal. Take 2 caps with meals, 1 cap with snacks 180 capsule 3   ??? potassium chloride SA (K-DUR,KLOR-CON) 20 MEQ tablet Take 3 tablets (60 mEq total) by mouth Two (2) times a day. 180 tablet 3   ??? predniSONE (DELTASONE) 5 MG tablet Take 1 tablet (  5 mg total) by mouth Two (2) times a day. 60 tablet 11   ??? tiotropium (SPIRIVA WITH HANDIHALER) 18 mcg inhalation capsule Place 1 capsule (18 mcg total) into inhaler and inhale once daily. 90 capsule 3   ??? traZODone (DESYREL) 100 MG tablet take 1/2 tablet by mouth at bedtime if needed for sleep 135 tablet 1   ??? triamcinolone (KENALOG) 0.1 % ointment Apply to thin areas of rash twice daily as needed 454 g 1     No current facility-administered medications for this visit.      I spent 15 minutes with JosephPhillips in direct patient care.     Joseph Phillips PharmD, BCOP, CPP  Hematology/Oncology Pharmacist  P: [phone]

## 2018-05-11 NOTE — Unmapped (Signed)
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 Prostate cancer:    ?? Lupron due again on or after 06/25/18   ?? Continue Abiraterone and prednisone, at a dose reduction abi to 3 tabs/day to see if this helps at all with myalgias  ?? Come back in ~ a month for labs, clinic visit  ?? He has been consented for STRATA  ?? CT and bone scan prior to next visit due to PSA rise x 2    2. Hypokalemia    ?? KDur 60 mEq qam/60 mEq qpm    3. Hypomagnesimia    ?? Continue mag ox BID    4. Cramps/leg pain    ?? Increase water intake  ?? Nightly vitamin E; continue vitamin B complex  ?? Better since he has consistently been taking his electrolyte supplements  ?? Onset prior to to starting abiraterone, so I don't think it's the only culprit. F/u with PCP to explore other etiologies  ?? Try naproxen w/ food      5. Wheezing    ?? Ran out of his Spiriva, so I sent in a new rx  ?? Let PCP know if worsens    6. Dysphonia    ?? Possibly from allergies  ?? Try Flonase, lortadine. If not improved at next visit, refer to ENT      7. Nocturia    ?? Given bladder diary and urinal to eval for nocturnal polyuria  ?? Want to hold off on rx anticholenergic        Approximately 25 minutes was spent in the care of this patient, half in education and counseling.    A copy of the AVS was provided to the patient which includes key points from our visit as well as our contact information.      [name], NP-C  Adult Nurse Practitioner  Urology & Medical Oncology      ONCOLOGIC HISTORY:     [name] is status post a robotic-assisted laparoscopic radical prostatectomy under the care of Dr. 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 There is 40% involvement. His pretreatment PSA was 6.2. He is status post external beam radiation under the care of Dr. Cathren Harsh here at [facility] as an adjuvant treatment from 05/29/2009 through 07/11/2009, total of 66.6 Gy.     His PSAs have been rising representing a biochemical recurrence, for which he was on a surveillance program with anticiptation of delayed ADT    Scans 04/2017 due to sharp PSA rise (48)  Bone scan: New metastatic disease  CT AP: multiple incidentals    Started Lupron 04/30/17  Started Abiraterone 1,000 mg and Prednisone 5 mg BID 08/07/17    Last scans: 04/2017    HISTORY OF PRESENT ILLNESS:     [name] is a very pleasant  African-American gentleman     He completed tx for zoster and sx resolved    He has missed some doses of his abiraterone and prednisone, and also his BP meds.    Ran out of mag, but  he has some more now    C/o hoarseness x 2 months, though I don't recall this being an issue at our last visit. Says happened around the same time he started having some nasal congestion    Ran out of his spiriva, and he is wheezy today    C/o nocturia x 4-5      His cramps have been  better, without muscle jerks  He notes some mild-moderate arthralgias/myalgias, mainly in his legs. The cramps and this pain predated his start of abiraterone. He has been taking 1,000 mg of Tylenol TID and says this doesn't help. Does reducing the abi hasn't helped either. He has not seen his PCP to discuss other etiologies/management      Allergies   Allergen Reactions   ??? Penicillins      Other reaction(s): UNKNOWN     Meds: Reviewed EPIC    ROS: Pertinent + in HPI    Vitals:    05/11/18 1019   BP: 164/96   Pulse: 86   Resp: 22   Temp: 36.5 ??C (97.7 ??F)   SpO2: 100%       PHYSICAL EXAMINATION:    GENERAL: Pleasant, NAD.   VS: Reviewed and unremarkable  Head: normocephalic  Eyes: sclera aniecteric.   Pulm: Non labored WOB; mild wheezes throughout  Card: RRR no MRG Photos in EPIC  Msk: No Le edema  NEURO: A&O x 3    Wt Readings from Last 6 Encounters:   05/11/18 70.8 kg (156 lb 1.6 oz)   03/26/18 73.8 kg (162 lb 12.8 oz)   11/24/17 75.1 kg (165 lb 9.6 oz)   10/30/17 73.6 kg (162 lb 3.2 oz)   10/16/17 73.9 kg (162 lb 14.4 oz)   10/02/17 73.7 kg (162 lb 6.4 oz)         MEDICAL DECISION MAKING:     Lab Results   Component Value Date    PSA 2.55 05/11/2018    PSA 1.38 03/26/2018    PSA 0.90 02/26/2018    PSA 0.82 01/01/2018    PSA 0.94 11/24/2017    PSA 0.97 10/30/2017       Lab Results   Component Value Date    PSADIAG 4.4 (H) 09/28/2014    PSADIAG 5.0 (H) 05/30/2014    PSADIAG 4.6 (H) 03/23/2014       Chemistry        Component Value Date/Time    NA 137 05/11/2018 1002    NA 140 02/20/2015 1121    K 3.8 05/11/2018 1002    K 3.5 02/20/2015 1121    CL 100 05/11/2018 1002    CL 104 02/20/2015 1121    CO2 25.0 05/11/2018 1002    CO2 23 02/20/2015 1121    BUN 18 05/11/2018 1002    BUN 13 02/20/2015 1121    CREATININE 1.05 05/11/2018 1002    CREATININE 1.04 02/20/2015 1121    CREATININE 1.4 07/25/2014 1123    CREATININE 1.6 (H) 07/25/2014 1118    GLU 116 05/11/2018 1002    GLU 112 02/20/2015 1121        Component Value Date/Time    CALCIUM 9.5 05/11/2018 1002    CALCIUM 8.4 (L) 02/20/2015 1121    ALKPHOS 70 05/11/2018 1002    AST 34 05/11/2018 1002    AST 38 09/11/2011 1637    ALT 26 05/11/2018 1002    ALT 31 09/11/2011 1637    BILITOT 0.8 05/11/2018 1002  BILITOT 0.5 09/11/2011 1637            CT Urogram: 04/22/17    Impression     - Bilateral renal cysts. No evidence for renal or ureteral mass. No renal calculi.  - Mild non-specific bladder wall irregularity, similar to prior.  - New, small sclerotic foci within the T11 and T12 vertebral bodies; osseous metastases cannot be excluded.  - Mildly prominent right pelvic sidewall lymph node is increased in size from prior, but not enlarged by size criteria. Attention on follow-up.  - Cholelithiasis.  - Hepatic steatosis.     Bone scan 04/29/17 Impression     1.Metastases in the proximal left proximal humeral shaft, T3 vertebral body and inferolateral margin of the left scapula suspected. Radiographic examination and/or CT examination could be considered, if clinically warranted, to confirm findings.

## 2018-05-11 NOTE — Unmapped (Signed)
7564:  Labs drawn and sent for analysis.  Care provided by  Will Bonnet.

## 2018-05-11 NOTE — Unmapped (Addendum)
?? Take your magnesium  ?? I sent in your blood pressure pill  ?? I sent in your Spiriva  ?? Potassium 3 pills in the morning and at night  ?? Keep taking your cancer medicine  ?? For 4 or 5 days, keep track of how much you drink and pee during the day and night (see chart below) and bring it with you  ?? Ask your pharmacist about Flonase (can get generic kind) and where to find it  ?? Come back in 4 weeks      [name], NP-C, OCN  Adult Nurse Practitioner  Urology and Medical Oncology    If you have been prescribed a medication today, always read the package insert that comes with the medication.    In the event of an emergency, always call 911    Urology:  Main clinic & [facility]: [phone]  Fax: [phone]    Cancer Hospital:  Phone: [phone]  Fax: [phone]    After hours/nights/weekends:  Hospital Operator: [phone]    [email or web address]  [email or web address]        Fluid intake: The amount of fluid you drink. Measure your cups, glasses &/or mugs before you start that you will know how much they hold. Record the amount & type of fluid consumed.    Urine passed: Measure either using a urinal or urine hat given to you at your appointment OR in any container you have at home that can measure in ounces or milliliters (mL).  A guide to converting ounces to mL is below.    Cath Volume: Record amount and times just as if you voided normally. If you catheterize for residual, record the times and amounts of that as well.    Leakage/Wetness: Record each leakage event and anything special about it, such as coughing, running, strong urge you couldn???t stop, etc???  Indicate how much you leak by writing 1+, 2+, 3+, etc.  A small leak is 1+, a large leak is 3+, etc..      1 oz = 30mL  8 oz =  16oz =  24 oz =  2 oz = 60mL  10 oz = 18 oz = 26 oz =  4 oz =  12 oz = 20 oz = 28 oz =  6 oz =   14oz =  22 oz = 30 0z =          EXAMPLE    Date Time Fluid Intake  mL   Urine Passed  mL Cath Volume  mL Leakage / Wetness Comments   4/26 6:30am       4/26 7:30am 6 oz juice       4/26 9:00am 12 oz water       4/26 12:00   1+ cough               Date Time Fluid Intake  mL   Urine Passed  mL Cath Volume  mL Leakage / Wetness Comments  Date Time Fluid Intake  mL   Urine Passed  mL Cath Volume  mL Leakage / Wetness Comments

## 2018-05-19 MED ORDER — OMEPRAZOLE 20 MG CAPSULE,DELAYED RELEASE
ORAL_CAPSULE | 3 refills | 0 days | Status: CP
Start: 2018-05-19 — End: 2019-06-02

## 2018-05-28 NOTE — Unmapped (Signed)
[facility] Refill Coordination Note  Specialty Medication(s): Zytiga 250mg , Prednisone 5mg       Joseph Phillips, DOB: [birth year]  Phone: [phone] (home) [phone] (work), Alternate phone contact: N/A  Phone or address changes today?: No  All above HIPAA information was verified with patient.  Shipping Address: [address]  [place] [address]   Insurance changes? No    Completed refill call assessment today to schedule patient's medication shipment from the [facility] [phone]).      Confirmed the medication and dosage are correct and have not changed: Yes, regimen is correct and unchanged.    Confirmed patient started or stopped the following medications in the past month:  No, there are no changes reported at this time.    Are you tolerating your medication?:  Joseph Phillips reports tolerating the medication.    ADHERENCE    Is this medicine transplant or covered by Medicare Part B? No.        Did you miss any doses in the past 4 weeks? No missed doses reported.    FINANCIAL/SHIPPING    Delivery Scheduled: Yes, Expected medication delivery date: 06/02/18 wfd     The patient will receive an FSI print out for each medication shipped and additional FDA Medication Guides as required.  Patient education from [facility] or Robet Leu may also be included in the shipment.    Joseph Phillips did not have any additional questions at this time.    Delivery address validated in FSI scheduling system: Yes, address listed in FSI is correct.    We will follow up with patient monthly for standard refill processing and delivery.      Thank you,  [employer]   [facility] Specialty Pharmacist

## 2018-05-29 MED ORDER — HYDROCHLOROTHIAZIDE 25 MG TABLET
ORAL_TABLET | 3 refills | 0 days | Status: CP
Start: 2018-05-29 — End: 2018-09-15

## 2018-06-01 MED FILL — ZYTIGA/250MG/TAB: ZYTIGA/250MG/TAB | 30 days supply | Qty: 90 | Fill #5

## 2018-06-01 MED FILL — PREDNISONE/5MG/TABS: PREDNISONE/5MG/TABS | 30 days supply | Qty: 60 | Fill #8

## 2018-06-04 ENCOUNTER — Ambulatory Visit: Admit: 2018-06-04 | Discharge: 2018-06-05 | Payer: MEDICAID

## 2018-06-04 ENCOUNTER — Ambulatory Visit: Admit: 2018-06-04 | Discharge: 2018-06-17 | Payer: MEDICAID

## 2018-06-04 ENCOUNTER — Ambulatory Visit: Admit: 2018-06-04 | Discharge: 2018-07-03 | Payer: MEDICAID

## 2018-06-04 DIAGNOSIS — C61 Malignant neoplasm of prostate: Principal | ICD-10-CM

## 2018-06-10 NOTE — Unmapped (Signed)
ENCOUNTER IN ERROR

## 2018-06-12 NOTE — Unmapped (Signed)
Talked to [name] re: POD in bone  1. Stop abiraterone and prednisone  2. Keep appt on July 8  3. Will talk to him about [facility] docetaxel vs DORA trial (requires 28 day washout)    Emotional support provided  He verablized understanding of the plan  mDunn

## 2018-06-23 NOTE — Unmapped (Signed)
I spoke to Joseph Phillips's brother, [name] and he said Dr. Shea Evans told him not to take Zytiga anymore.  He has an appointment 06/29/18.  We will check on him after his appointment.

## 2018-06-29 ENCOUNTER — Ambulatory Visit
Admit: 2018-06-29 | Discharge: 2018-06-30 | Payer: MEDICAID | Attending: Nurse Practitioner | Primary: Nurse Practitioner

## 2018-06-29 ENCOUNTER — Other Ambulatory Visit: Admit: 2018-06-29 | Discharge: 2018-06-30 | Payer: MEDICAID

## 2018-06-29 DIAGNOSIS — C61 Malignant neoplasm of prostate: Principal | ICD-10-CM

## 2018-06-29 DIAGNOSIS — R600 Localized edema: Secondary | ICD-10-CM

## 2018-06-29 LAB — COMPREHENSIVE METABOLIC PANEL
ALBUMIN: 3.4 g/dL — ABNORMAL LOW (ref 3.5–5.0)
ALKALINE PHOSPHATASE: 82 U/L (ref 38–126)
ANION GAP: 7 mmol/L — ABNORMAL LOW (ref 9–15)
AST (SGOT): 59 U/L — ABNORMAL HIGH (ref 19–55)
BILIRUBIN TOTAL: 0.3 mg/dL (ref 0.0–1.2)
BLOOD UREA NITROGEN: 10 mg/dL (ref 7–21)
BUN / CREAT RATIO: 12
CALCIUM: 9.1 mg/dL (ref 8.5–10.2)
CHLORIDE: 111 mmol/L — ABNORMAL HIGH (ref 98–107)
CO2: 20 mmol/L — ABNORMAL LOW (ref 22.0–30.0)
CREATININE: 0.81 mg/dL (ref 0.70–1.30)
EGFR CKD-EPI AA MALE: >90 mL/min/{1.73_m2} (ref >=60)
EGFR CKD-EPI NON-AA MALE: >90 mL/min/{1.73_m2} (ref >=60)
GLUCOSE RANDOM: 107 mg/dL (ref 65–179)
POTASSIUM: 4.1 mmol/L (ref 3.5–5.0)
PROTEIN TOTAL: 6.6 g/dL (ref 6.5–8.3)
SODIUM: 138 mmol/L (ref 135–145)

## 2018-06-29 LAB — CBC W/ AUTO DIFF
BASOPHILS ABSOLUTE COUNT: 0 10*9/L (ref 0.0–0.1)
BASOPHILS RELATIVE PERCENT: 0.9 %
EOSINOPHILS ABSOLUTE COUNT: 0.1 10*9/L (ref 0.0–0.4)
EOSINOPHILS RELATIVE PERCENT: 1.4 %
HEMATOCRIT: 31.1 % — ABNORMAL LOW (ref 41.0–53.0)
HEMOGLOBIN: 10.9 g/dL — ABNORMAL LOW (ref 13.5–17.5)
LARGE UNSTAINED CELLS: 3 % (ref 0–4)
LYMPHOCYTES ABSOLUTE COUNT: 1 10*9/L — ABNORMAL LOW (ref 1.5–5.0)
LYMPHOCYTES RELATIVE PERCENT: 24.1 %
MEAN CORPUSCULAR HEMOGLOBIN CONC: 35 g/dL (ref 31.0–37.0)
MEAN CORPUSCULAR HEMOGLOBIN: 36.1 pg — ABNORMAL HIGH (ref 26.0–34.0)
MEAN CORPUSCULAR VOLUME: 103.2 fL — ABNORMAL HIGH (ref 80.0–100.0)
MEAN PLATELET VOLUME: 7.5 fL (ref 7.0–10.0)
MONOCYTES ABSOLUTE COUNT: 0.4 10*9/L (ref 0.2–0.8)
MONOCYTES RELATIVE PERCENT: 8.7 %
NEUTROPHILS ABSOLUTE COUNT: 2.6 10*9/L (ref 2.0–7.5)
NEUTROPHILS RELATIVE PERCENT: 61.9 %
PLATELET COUNT: 288 10*9/L (ref 150–440)
RED BLOOD CELL COUNT: 3.01 10*12/L — ABNORMAL LOW (ref 4.50–5.90)
RED CELL DISTRIBUTION WIDTH: 13.1 % (ref 12.0–15.0)

## 2018-06-29 LAB — BUN / CREAT RATIO: Urea nitrogen/Creatinine:MRto:Pt:Ser/Plas:Qn:: 12

## 2018-06-29 LAB — MAGNESIUM: Magnesium:MCnc:Pt:Ser/Plas:Qn:: 1.3 — ABNORMAL LOW

## 2018-06-29 LAB — PSA, DIAGNOSTIC: PROSTATE SPECIFIC ANTIGEN: 3.99 ng/mL (ref 0.00–4.00)

## 2018-06-29 LAB — PROSTATE SPECIFIC ANTIGEN: Prostate specific Ag:MCnc:Pt:Ser/Plas:Qn:: 3.99

## 2018-06-29 LAB — TESTOSTERONE TOTAL: Testosterone:MCnc:Pt:Ser/Plas:Qn:: 6 — ABNORMAL LOW

## 2018-06-29 LAB — NEUTROPHILS RELATIVE PERCENT: Lab: 61.9

## 2018-06-29 LAB — TESTOSTERONE: TESTOSTERONE TOTAL: 6 ng/dL — ABNORMAL LOW (ref 179–756)

## 2018-06-29 MED ORDER — OXYCODONE 5 MG TABLET
ORAL_TABLET | Freq: Three times a day (TID) | ORAL | 0 refills | 0 days | Status: CP | PRN
Start: 2018-06-29 — End: 2018-07-15

## 2018-06-29 NOTE — Unmapped (Signed)
Hi,     Patient contacted the Communication Center regarding the following:    - Checking to see if Nurse Dunn faxed his Oxycodone prescription over to his [facility]    Please contact patient at [phone].    Thanks in advance,    Jodi Mourning  [facility]   [phone]

## 2018-06-29 NOTE — Unmapped (Signed)
4098:  Labs drawn and sent for analysis.  Care provided by  Roque Lias, RN

## 2018-06-29 NOTE — Unmapped (Signed)
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 Prostate cancer:    ?? Lupron today, due again on or after 09/27/18  ?? He is not interested in clinical trial (DORA)  ?? He would like to move forward with docetaxel chemotherapy. However, it is becoming increasingly difficult for him to  Make the trips to [facility]. He would be willing to be seen by Dr Joseph Phillips at  [facility], so we will arrange this. We did review docetaxel MOA, rationale, SE, pred/dex, and schedule. I would be happy to see him back at any point  ?? He has been consented for STRATA  ?? Consider denosumab at some point; has poor dentition to consider dental eval prior.     2. Hypokalemia    ?? KDur 60 mEq qam/60 mEq qpm    3. Hypomagnesimia    ?? Continue mag ox BID    4. Cramps/leg pain    ?? Increase water intake  ?? Nightly vitamin E; continue vitamin B complex  ?? Better since he has consistently been taking his electrolyte supplements  ?? Onset prior to starting abiraterone, so I don't think it's the only culprit. F/u with PCP to explore other etiologies  ?? Try naproxen w/ food      5. LE edema    ?? Venous duplex u/s of B LE to be done today      6. Dysphonia    ?? Resolved    7. HTN    ?? Has not taken his anti-htn. hasve asked him to try to remember to take it before he comes to his appts    F/u with me open ended for now    I discussed docetaxel (Taxtotere) chemotherapy including rationale, treatment schedule, and potential side effects including but not limited to fatigue, alopecia, myelosuppression, n/v/d/c, infection, stomatitis, & taste change among others. Serious side effects include hepatotoxicity, febrile neutropenia, sepsis, hypersensitivity reaction, cardiac dysfunction, among others.      The dose will be 75 mg/m2 IV on day 1 of a 21-day cycle. May be dose reduced, held, or skipped, if necessary. I will see him in clinic on day 1 of each cycle. Will have PIV placed each cycle, consider CVA if difficult peripheral access. Reviewed dexamethasone premedication to be taken 12, 3 and 1 hour prior to chemotherapy. Reviewed prednisone 5 mg BID. We will plan for 10 cycles.    We will continue his primary ADT.    He is clear on the need to report any bothersome side effects as well as fever >100.5 with/without shaking chills.    We like for patients to remain on the same therapy unless 1) there is meaningful PSA progression 2) there is significant clinical progression 3) there is a significant progression on CT scan/bone scan including new tumors or significant growth of existing tumors 4) the patient suffers intolerable/uncontrollable side effects, or 5) the patient desires to stop therapy.      The goal of treatment is to slow the progression of his prostate cancer.  Eventually, the doxetaxel will stop working and we will need to explore different treatment. We will incorporate best supportive care and palliative care throughout the trajectory of this disease.      Approximately 25 minutes was spent in  the care of this patient, half in education and counseling.    A copy of the AVS was provided to the patient which includes key points from our visit as well as our contact information.      Joseph Parr, NP-C  Adult Nurse Practitioner  Urology & Medical Oncology      ONCOLOGIC HISTORY:     [name] is status post a robotic-assisted laparoscopic radical prostatectomy under the care of Dr. Sinclair Phillips on 01/24/2009 for what proved to be a pT3a pNx Mx   adenocarcinoma of prostate extending out towards the left base into the fat, Gleason 3+4 equals 7.He did have a 3 mm positive margin along the left prostate base. There is 40% involvement. His pretreatment PSA was 6.2. He is status post external beam radiation under the care of Dr. Cathren Phillips here at [facility] as an adjuvant treatment from 05/29/2009 through 07/11/2009, total of 66.6 Gy.     His PSAs have been rising representing a biochemical recurrence, for which he was on a surveillance program with anticiptation of delayed ADT    Scans 04/2017 due to sharp PSA rise (48)  Bone scan: New metastatic disease  CT AP: multiple incidentals    Started Lupron 04/30/17  Started Abiraterone 1,000 mg and Prednisone 5 mg BID 08/07/17    Last scans: 05/2018 w/ POD  Last dose of abi/pred 06/11/18    HISTORY OF PRESENT ILLNESS:     [name] is a very pleasant  African-American gentleman     Today, he notes    -B LE pain, different than his typical LE pain. Also has some edema. R is more pitting than L.  -feels cold. No fevers or chills/rigors  -continues to have nocturia      His cramps have been  better, without muscle jerks  Also has some mild-moderate arthralgias/myalgias, mainly in his legs. The cramps and this pain predated his start of abiraterone. He has been taking 1,000 mg of Tylenol TID and says this doesn't help. Does reducing the abi hasn't helped either. He has not seen his PCP to discuss other etiologies/management      Allergies   Allergen Reactions   ??? Penicillins      Other reaction(s): UNKNOWN     Meds: Reviewed EPIC    ROS: Pertinent + in HPI    Vitals:    06/29/18 1030   BP: 144/90   Pulse:    Resp:    Temp:    SpO2:        PHYSICAL EXAMINATION:    GENERAL: Pleasant, NAD.   VS: Reviewed and unremarkable  Head: normocephalic  Eyes: sclera aniecteric.   Pulm: Non labored WOB; mild wheezes throughout  Card: RRR no MRG; HR 92 on my exam  Msk: B LE edema, mainly feet/ankles, some in the distal portion of lower leg  NEURO: A&O x 3    Wt Readings from Last 6 Encounters:   06/29/18 74.6 kg (164 lb 8 oz)   05/11/18 70.8 kg (156 lb 1.6 oz)   03/26/18 73.8 kg (162 lb 12.8 oz)   11/24/17 75.1 kg (165 lb 9.6 oz)   10/30/17 73.6 kg (162 lb 3.2 oz)   10/16/17 73.9 kg (162 lb 14.4 oz)         MEDICAL DECISION MAKING:     Lab Results Component Value Date    PSA 3.99 06/29/2018    PSA 2.55 05/11/2018    PSA 1.38 03/26/2018  PSA 0.90 02/26/2018    PSA 0.82 01/01/2018    PSA 0.94 11/24/2017       Lab Results   Component Value Date    PSADIAG 4.4 (H) 09/28/2014    PSADIAG 5.0 (H) 05/30/2014    PSADIAG 4.6 (H) 03/23/2014       Chemistry        Component Value Date/Time    NA 138 06/29/2018 0858    NA 140 02/20/2015 1121    K 4.1 06/29/2018 0858    K 3.5 02/20/2015 1121    CL 111 (H) 06/29/2018 0858    CL 104 02/20/2015 1121    CO2 20.0 (L) 06/29/2018 0858    CO2 23 02/20/2015 1121    BUN 10 06/29/2018 0858    BUN 13 02/20/2015 1121    CREATININE 0.81 06/29/2018 0858    CREATININE 1.04 02/20/2015 1121    CREATININE 1.4 07/25/2014 1123    CREATININE 1.6 (H) 07/25/2014 1118    GLU 107 06/29/2018 0858    GLU 112 02/20/2015 1121        Component Value Date/Time    CALCIUM 9.1 06/29/2018 0858    CALCIUM 8.4 (L) 02/20/2015 1121    ALKPHOS 82 06/29/2018 0858    AST 59 (H) 06/29/2018 0858    AST 38 09/11/2011 1637    ALT 27 06/29/2018 0858    ALT 31 09/11/2011 1637    BILITOT 0.3 06/29/2018 0858    BILITOT 0.5 09/11/2011 1637            CT Urogram: 04/22/17    Impression     - Bilateral renal cysts. No evidence for renal or ureteral mass. No renal calculi.  - Mild non-specific bladder wall irregularity, similar to prior.  - New, small sclerotic foci within the T11 and T12 vertebral bodies; osseous metastases cannot be excluded.  - Mildly prominent right pelvic sidewall lymph node is increased in size from prior, but not enlarged by size criteria. Attention on follow-up.  - Cholelithiasis.  - Hepatic steatosis.     Bone scan 04/29/17    Impression     1.Metastases in the proximal left proximal humeral shaft, T3 vertebral body and inferolateral margin of the left scapula suspected. Radiographic examination and/or CT examination could be considered, if clinically warranted, to confirm findings.

## 2018-06-29 NOTE — Unmapped (Addendum)
Please call the internal medicine clinic to make a follow up appointment- [phone]     We will get an ultrasound of your legs today to make sure there is no blood clot    Keep taking your potassium and magnesium pills    We will refer you to see Dr Claude Manges at [facility] to talk about chemo    The chemo is called Docetaxel. You would take a steroid pill with it called prednisone    Lupron shot today, again in 3 months.   Will consider adding a bone medicine at some point; not urgent    [name], NP-C, OCN  Adult Nurse Practitioner  Urology and Medical Oncology    If you have been prescribed a medication today, always read the package insert that comes with the medication.    In the event of an emergency, always call 911    Urology:  Main clinic & [facility]: [phone]  Fax: [phone]    Cancer Hospital:  Phone: [phone]  Fax: [phone]    After hours/nights/weekends:  Hospital Operator: [phone]    [email or web address]  [email or web address]

## 2018-06-30 NOTE — Unmapped (Signed)
AOC Triage Note     Patient: Joseph Phillips     Reason for call:  oxycodone Rx    Time call returned: 1014     Phone Assessment: Attempted to contact pt regarding request for oxycodone refill. Unable to leave vm as mailbox is not set up.     Triage Recommendations: Verified receipt of oxycodone Rx with [facility], [address] [place].     Patient Response: N/A     Outstanding tasks: Oxycodone Rx sent to pharmacy on 7/8.     Patient Pharmacy has been verified and primary pharmacy has been marked as preferred

## 2018-07-01 NOTE — Unmapped (Signed)
[facility] Refill Coordination Note  Specialty Medication(s): Zytiga 250  Additional Medications shipped: prednisone 5mg     Joseph Phillips, DOB: 1961-12-08  Phone: [phone] (home) [phone] (work), Alternate phone contact: N/A  Phone or address changes today?: No  All above HIPAA information was verified with patient.  Shipping Address: [address]  [place] [address]   Insurance changes? No    Completed refill call assessment today to schedule patient's medication shipment from the [facility] [phone]).      Confirmed the medication and dosage are correct and have not changed: Yes, regimen is correct and unchanged.    Confirmed patient started or stopped the following medications in the past month:  No, there are no changes reported at this time.    Are you tolerating your medication?:  [name] reports tolerating the medication.    ADHERENCE    (Below is required for Medicare Part B or Transplant patients only - per drug):   How many tablets were dispensed last month: 90  Patient currently has 21 remaining.    Did you miss any doses in the past 4 weeks? No missed doses reported.    FINANCIAL/SHIPPING    Delivery Scheduled: Yes, Expected medication delivery date: 07/06/2018     The patient will receive an FSI print out for each medication shipped and additional FDA Medication Guides as required.  Patient education from [place] or Robet Leu may also be included in the shipment    [name] did not have any additional questions at this time.    Delivery address validated in FSI scheduling system: Yes, address listed in FSI is correct.    We will follow up with patient monthly for standard refill processing and delivery.      Thank you,  Breck Coons [facility] Specialty Pharmacist

## 2018-07-02 MED FILL — PREDNISONE/5MG/TABS: PREDNISONE/5MG/TABS | 30 days supply | Qty: 60 | Fill #9

## 2018-07-02 MED FILL — ZYTIGA/250MG/TAB: ZYTIGA/250MG/TAB | 30 days supply | Qty: 90 | Fill #6

## 2018-07-03 ENCOUNTER — Ambulatory Visit: Admit: 2018-07-03 | Discharge: 2018-07-03 | Payer: MEDICAID

## 2018-07-03 DIAGNOSIS — C61 Malignant neoplasm of prostate: Principal | ICD-10-CM

## 2018-07-03 DIAGNOSIS — R600 Localized edema: Secondary | ICD-10-CM

## 2018-07-07 ENCOUNTER — Ambulatory Visit
Admit: 2018-07-07 | Discharge: 2018-07-08 | Payer: MEDICAID | Attending: Hematology & Oncology | Primary: Hematology & Oncology

## 2018-07-07 DIAGNOSIS — C61 Malignant neoplasm of prostate: Secondary | ICD-10-CM

## 2018-07-07 DIAGNOSIS — G4730 Sleep apnea, unspecified: Principal | ICD-10-CM

## 2018-07-07 NOTE — Unmapped (Addendum)
--   Get your teeth taking care of, so that you can get clearance from your dentist to go on a drug called Xgeva  --We will plan to have you back next week to start on a drug called Taxotere (docetaxel).  See the information printed below for more details about this drug.  --I would like you to take dexamethasone 4 mg tablets 2 tablets twice daily, for 2 days after each chemotherapy.  --Take Compazine 10 mg the evening of your chemotherapy, and the next morning.  You can also take this medication every 6 hours as needed for nausea.  --Continue on prednisone 5 mg twice daily for the present time.  --Occasionally the antinausea medication can be constipating.  It may be wise to start taking MiraLAX 1 capful nightly and liquid, starting at night or 2 prior to your chemotherapy, and continuing on this until you are no longer needing any antinausea.  You should also have a laxative like Senokot on hand to use, as 1 or 2 tablets twice a day as needed, to ensure at least one good bowel movement.  --Avoid salt in the diet, and try doubling up hydrochlorothiazide for up to 3 days, to see if this removed any the fluid in your ankles.    --Consider a trial of Tonic Water 12 ounces daily, to alleviate muscle cramping  --If any new problems or questions arise, do not hesitate to contact us.  The clinic phone number is [phone], or send a message using the email feature of My [facility] Chart

## 2018-07-07 NOTE — Unmapped (Signed)
Initial Visit Note    Patient Name: Joseph Phillips  Patient Age: 57 y.o.  Encounter Date: 07/07/2018    Referring Physician:     Chancy Hurter, ANP  [address]  Surgery  [id number] [facility]  [place], [place] [address]    Primary Care Provider:  Unknown Foley, MD    Assessment/Plan:    Reason for visit:  Prostate Cancer    Cancer Staging  Prostate cancer (CMS-HCC)  Staging form: Prostate, AJCC 7th Edition  - Clinical: Stage IV (T3a, N0, M1b, PSA: Less than 10, Gleason 7) - Unsigned      --Prostate cancer, stage IV, with progression on Lupron/Zytiga  --Hypokalemia  --Hypomagnesemia  --Leg cramps and pain, exacerbated by electrolyte disturbances  --Teeth in poor repair, awaiting dental correction  --Hypertension  --Lower extremity swelling, exacerbated at night     --?  OSA  --History of pancreatitis with pancreatic insufficiency    Recommendation  --The patient has progressive disease, but still has relatively low burden of bone only metastasis, with a low PSA.  Options include continued observation off of Zytiga on Lupron alone, a trial of docetaxel, a trial of Xtandi, Provenge.  Patient has been referred to this clinic to start on docetaxel, and after discussing risks and benefits of other treatment options, we have elected to go forward, but start with it 20% dose reduction in docetaxel to assure tolerance.  He will have antiemetics with ondansetron, dexamethasone, and Compazine.  Prescriptions been written.  Ample time is been given to answer all the patient's questions.  --Patient will get dental evaluation and treatment; the aim is to get clearance to start on Xgeva  --The patient's lower extremity swelling is suggested to be secondary to OSA, as he says it worsens overnight.  Will arrange for sleep study  --We will arrange for port to be placed, although I believe he will be able to get his first cycle of therapy without report peer  --I will plan to see him prior to cycle 2 to evaluate tolerance and determine ongoing dosing.      I have reviewed the laboratory, pathology, and radiology reports in detail and discussed findings with the patient    History of Present Illness:     Joseph Phillips is a 57 y.o. male who is seen in consultation at the request of Chancy Hurter, A* for an evaluation of Prostate Cancer.    Patient was originally diagnosed with prostate cancer in 2006.  He underwent robot-assisted prostatectomy.  He did well until May 2018, when his PSA rose to a high of about 45, and he was started on Lupron, initially with Casodex, and then with apparatus.  His PSA fell and remained low until recently when he was noted to be rising from a low of 0.82 on  01/01/18, to 3.99 on 06/29/2018.  He was instructed to hold Zytiga.  Since holding Zytiga he is noted swelling in his ankles.  The swelling comes on overnight, improves during the course the day.  He was evaluated with Doppler studies on 06/29/2018 which showed no evidence of blood clots.  He is still bothered by the swelling.  Restaging studies done on 06/04/2018 including bone scan, and CT of the chest abdomen pelvis, showed sclerotic changes throughout the axial skeleton, and involvement also of scapula, some ribs and pelvis.  The sites were new since 2016, and also probably new since the CT stone study in May 2018.  Bone scan  also showed some progression in disease by report, compared to 2018.  He has developed pain in the back of his calves, for which he takes oxycodone (Tylenol and ibuprofen were ineffective).  The pain followed prolonged episodes of cramping, felt to be related to low magnesium and potassium.  About 6 months ago he had shingles in a left L2 distribution.            Oncology History:       Prostate cancer (CMS-HCC)    12/02/2008 Biopsy     Prostate, left apex, needle biopsy  - Adenocarcinoma, Gleason combined score 6 (3+3), 2 of 2 cores involved, tumor  approximately 2 mm in greatest diameter, approximately 10% of tissue involved by  carcinoma.  - See comment    E: ??Prostate, left mid, needle biopsy  - Adenocarcinoma, Gleason combined score 6 (3+3), 1 of 2 cores involved, tumor  approximately 2 mm in greatest diameter, approximately 5% of tissue involved by  carcinoma.  - See comment    F: ??Prostate, left base, needle biopsy  - Adenocarcinoma, Gleason combined score 6 (3+3), 1 of 2 cores involved, tumor  approximately 1 mm in greatest diameter, approximately 5% of tissue involved by  carcinoma.         01/24/2009 Surgery     Prostate, robot assisted laparoscopic prostatectomy  Tumor histologic type: Prostatic adenocarcinoma  Tumor grade (Gleason system): Gleason pattern 7 (3+4)  Approximate percentage of prostate involved by tumor: 40%  Extent of tumor:  Location of tumor involvement in prostate: right and left lobes, anterior  and posterior, apex to base  Confinement/non-confinement of tumor within the prostatic capsule: ??not  confined  Location of extracapsular tumor involvement: ??left prostate toward the base  (ie. block A22)  Type of extracapsular tissue involved within tumor: ?? adipose  tissue/perineural tissue  Angiolymphatic space invasion: not identified  Seminal vesicles: negative  Vas deferens: negative  Histologic assessment of surgical margins: positive margin along a 3 mm  face in the left prostate base (block A25)    Other significant findings: High grade prostatic intraepithelial  Neoplasm;  Lymph nodes: Not applicable  AJCC Staging: pT3a pNx         08/30/2013 Initial Diagnosis     Prostate cancer (CMS-HCC)         04/30/2017 -  Chemotherapy     Lupron 22.5 mg; with bicalutamide x  month         08/01/2017 - 06/30/2018 Chemotherapy     Abiaterone 1000 mg daily; decrease to 750 mg daily at 10/2018;         10/30/2017 -  Cancer Staged     Starta: TP53, TMPSSR  -EGR fusion; no other abnormalities identified         06/04/2018 -  Cancer Staged     CT CAP:  Scattered diffuse sclerotic metastatic lesions are seen throughout the spine as well as the left scapula; some of the lesions are new since prior CT dated 04/20/2015.  - No intrathoracic metastatic disease.  Interval progression of osseous metastases throughout the spine and pelvis compared to 04/22/2017.  -- Cholelithiasis.  -- Hepatic steatosis.         06/04/2018 -  Cancer Staged     Bone scan:  Increased radiotracer uptake throughout the spine, sacrum, and left ilium, consistent with numerous metastases better evaluated on same-day CT. Unchanged metastasis to the left proximal humerus.         07/14/2018 -  Chemotherapy  Chemotherapy Treatment    Treatment Goal Control   Line of Treatment [No plan line of treatment]   Plan Name OP PROSTATE DOCETAXEL/PREDNISONE   Start Date 07/14/2018 (Planned)   End Date 10/28/2018 (Planned)   Provider Towanda Malkin, MD   Chemotherapy dexAMETHasone (DECADRON) tablet 8 mg, 8 mg, Oral, Once, 0 of 6 cycles  DOCEtaxel (TAXOTERE) 110.4 mg in sodium chloride 0.9% NON-PVC (NS) 0.9 % 250 mL IVPB, 60 mg/m2 = 110.4 mg (80 % of original dose 75 mg/m2), Intravenous, Once, 0 of 6 cycles  Dose modification: 60 mg/m2 (80 % of original dose 75 mg/m2, Cycle 1, Reason: Dose Not Tolerated)               Past Medical History:   Diagnosis Date   ??? Hx of radiation therapy    ??? Hypertension    ??? Prostate cancer (CMS-HCC)       Past Surgical History:   Procedure Laterality Date   ??? PR COLSC FLX W/RMVL OF TUMOR POLYP LESION SNARE TQ N/A 08/16/2015    Procedure: COLONOSCOPY FLEX; W/REMOV TUMOR/LES BY SNARE;  Surgeon: Gwen Pounds, MD;  Location: [facility];  Service: Gastroenterology   ??? PR COLSC FLX WITH DIRECTED SUBMUCOSAL NJX ANY SBST N/A 08/16/2015    Procedure: COLONOSCOPY, FLEXIBLE, PROXIMAL TO SPLENIC FLEXURE; WITH DIRECTED SUBMUCOSAL INJECTION(S), ANY SUBSTANCE;  Surgeon: Gwen Pounds, MD;  Location: [facility];  Service: Gastroenterology   ??? PROSTATE SURGERY          Family History   Problem Relation Age of Onset   ??? Melanoma Neg Hx    ??? Basal cell carcinoma Neg Hx    ??? Squamous cell carcinoma Neg Hx       No family status information on file.     Social History     Occupational History   ??? Not on file   Tobacco Use   ??? Smoking status: Current Some Day Smoker     Packs/day: 0.30     Years: 21.00     Pack years: 6.30     Types: Cigarettes     Start date: 05/09/1994   ??? Smokeless tobacco: Never Used   ??? Tobacco comment: states only smoke about 3  aday   Substance and Sexual Activity   ??? Alcohol use: Yes     Alcohol/week: 1.2 oz     Types: 2 Cans of beer per week   ??? Drug use: No   ??? Sexual activity: Not on file       Allergies   Allergen Reactions   ??? Penicillins      Other reaction(s): UNKNOWN   ??? Tramadol Dizziness       Current Outpatient Medications   Medication Sig Dispense Refill   ??? amLODIPine (NORVASC) 10 MG tablet Take 1 tablet (10 mg total) by mouth daily. 90 tablet 3   ??? busPIRone (BUSPAR) 5 MG tablet Take 1 tablet (5 mg total) by mouth Three (3) times a day. 270 tablet 3   ??? hydroCHLOROthiazide (HYDRODIURIL) 25 MG tablet take 1 tablet by mouth once daily 90 tablet 3   ??? magnesium oxide (MAGOX) 400 mg (241.3 mg magnesium) tablet Take 1 tablet (400 mg total) by mouth Two (2) times a day. 60 tablet 6   ??? omeprazole (PRILOSEC) 20 MG capsule take 2 capsule by mouth once daily 180 capsule 3   ??? oxyCODONE (ROXICODONE) 5 MG immediate release tablet Take 1 tablet (  5 mg total) by mouth every eight (8) hours as needed for pain. 30 tablet 0   ??? pancrelipase, Lip-Prot-Amyl, (CREON) 24,000-76,000 -120,000 unit CpDR Take 1 capsule (24,000 units of lipase total) by mouth Three (3) times a day with a meal. Take 2 caps with meals, 1 cap with snacks 180 capsule 3   ??? potassium chloride SA (K-DUR,KLOR-CON) 20 MEQ tablet Take 3 tablets (60 mEq total) by mouth Two (2) times a day. 180 tablet 3   ??? traZODone (DESYREL) 100 MG tablet take 1/2 tablet by mouth at bedtime if needed for sleep 135 tablet 1   ??? b complex vitamins capsule Take 1 capsule by mouth daily. (Patient not taking: Reported on 07/07/2018) 30 capsule 6   ??? tiotropium (SPIRIVA WITH HANDIHALER) 18 mcg inhalation capsule Place 1 capsule (18 mcg total) into inhaler and inhale once daily. (Patient not taking: Reported on 07/07/2018) 90 capsule 3   ??? triamcinolone (KENALOG) 0.1 % ointment Apply to thin areas of rash twice daily as needed (Patient not taking: Reported on 07/07/2018) 454 g 1     No current facility-administered medications for this visit.        The following portions of the patient's history were reviewed and updated as appropriate: allergies, current medications, past family history, past medical history, past social history, past surgical history and problem list.     Review of Systems   Constitutional: Positive for fatigue. Negative for appetite change, chills, diaphoresis, fever and unexpected weight change.   HENT:   Negative for mouth sores, nosebleeds and trouble swallowing.    Eyes: Negative for eye problems.   Respiratory: Negative for chest tightness, cough, hemoptysis and shortness of breath.    Cardiovascular: Negative for chest pain and leg swelling.   Gastrointestinal: Negative for abdominal pain, blood in stool, constipation, diarrhea and nausea.   Genitourinary: Negative for difficulty urinating and hematuria.    Musculoskeletal: Positive for back pain and myalgias. Negative for arthralgias.   Skin: Negative for rash.   Neurological: Negative for extremity weakness, headaches and seizures.   Hematological: Negative for adenopathy.   Psychiatric/Behavioral: Positive for depression. Negative for confusion.   All other systems reviewed and are negative.        Vital Signs for this encounter:  BSA: 1.84 meters squared  BP 124/80  - Pulse 95  - Temp 36 ??C (96.8 ??F) (Temporal)  - Wt 70.7 kg (155 lb 14.4 oz)  - SpO2 97%  - BMI 23.71 kg/m??      Physical Exam   Constitutional: He is oriented to person, place, and time.   Ill-appearing African-American gentleman, some swelling in the lower face, in no acute distress.   HENT:   Many teeth missing, remaining teeth with significant gum disease.   Neck: No JVD present. No thyromegaly present.   Cardiovascular: Normal rate, regular rhythm and normal heart sounds.   Pulmonary/Chest: Effort normal and breath sounds normal.   Abdominal: Bowel sounds are normal. He exhibits no distension and no mass. There is no tenderness.   Musculoskeletal: He exhibits no tenderness.   There is trace to 1+ edema in bilateral feet and ankles.   Lymphadenopathy:     He has no cervical adenopathy.   No adenopathy neck, supraclavicular, axillary inguinal regions.   Neurological: He is alert and oriented to person, place, and time.   Skin: No rash noted.   Psychiatric: He has a normal mood and affect. His behavior is normal.  Karnofsky/Lansky Performance Status  80, Normal activity with effort; some signs or symptoms of disease (ECOG equivalent 1)    Results:    WBC   Date Value Ref Range Status   06/29/2018 4.2 (L) 4.5 - 11.0 10*9/L Final   04/17/2012 7.6 4.5 - 11.0 x10 9th/L Final     HGB   Date Value Ref Range Status   06/29/2018 10.9 (L) 13.5 - 17.5 g/dL Final   16/09/9603 54.0 13.5 - 17.5 G/DL Final     HCT   Date Value Ref Range Status   06/29/2018 31.1 (L) 41.0 - 53.0 % Final   04/17/2012 39.2 (L) 41.0 - 53.0 % Final     Platelet   Date Value Ref Range Status   06/29/2018 288 150 - 440 10*9/L Final   04/17/2012 242 150 - 440 x10 9th/L Final     Creatinine Whole Blood, POC   Date Value Ref Range Status   07/25/2014 1.6 (H) 0.8 - 1.4 mg/dL Final     Creatinine   Date Value Ref Range Status   06/29/2018 0.81 0.70 - 1.30 mg/dL Final   98/10/9146 8.29 0.70 - 1.30 mg/dL Final     Creatinine, Whole Blood   Date Value Ref Range Status   07/25/2014 1.4 0.8 - 1.4 mg/dL Final     AST   Date Value Ref Range Status   06/29/2018 59 (H) 19 - 55 U/L Final   09/11/2011 38 19 - 55 U/L Final     PSA Diagnostic   Date Value Ref Range Status   09/28/2014 4.4 (H) 0.0 - 4.0 ng/mL Final   05/30/2014 5.0 (H) 0.0 - 4.0 ng/mL Final   03/23/2014 4.6 (H) 0.0 - 4.0 ng/mL Final   10/04/2013 3.7 0.0 - 4.0 ng/mL Final   06/24/2013 2.6 0.0 - 4.0 ng/mL Final   01/18/2013 1.6 0.0 - 4.0 NG/ML Final   09/28/2012 1.5 0.0 - 4.0 NG/ML Final   07/03/2012 1.0 0.0 - 4.0 NG/ML Final   12/30/2011 0.5 0.0 - 4.0 NG/ML Final   07/15/2011 0.3 0.0 - 4.0 NG/ML Final   04/08/2011 0.3 0.0 - 4.0 NG/ML Final

## 2018-07-08 NOTE — Unmapped (Signed)
-----   Message from Chancy Hurter, ANP sent at 07/07/2018  4:23 PM EDT -----  Regarding: doppler negative  Hi  Can you pls let Joseph Phillips know his doppler did not show any clots? I think both of the numbers listed are for his brothers, since he hasn't had a working phone in [employer] and let me know if any questions

## 2018-07-08 NOTE — Unmapped (Signed)
Spoke with pt and discussed doppler results. No questions at this time-will call if any arise prior to next visit.

## 2018-07-09 NOTE — Unmapped (Signed)
Hi,     Wilburn Mylar contacted the [employer] regarding the following:    - Patient scheduled today for port placement. His appointment will have to be cancelled as he can't schedule a tooth extraction until Monday. Patient due to start infusions Tuesday, concerned that this will throw off plan of care.    Please contact Wilburn Mylar at [phone].    Thanks in advance,    Kelli Hope  [facility]   [phone]

## 2018-07-10 ENCOUNTER — Ambulatory Visit: Admit: 2018-07-10 | Discharge: 2018-07-10 | Payer: MEDICAID

## 2018-07-13 NOTE — Unmapped (Signed)
VIR prep completed. Pt will need port access for infusion same day. Brother driving him. Instructed to take AM meds.

## 2018-07-15 ENCOUNTER — Ambulatory Visit: Admit: 2018-07-15 | Discharge: 2018-07-15 | Payer: MEDICAID

## 2018-07-15 DIAGNOSIS — C61 Malignant neoplasm of prostate: Principal | ICD-10-CM

## 2018-07-15 LAB — COMPREHENSIVE METABOLIC PANEL
ALBUMIN: 3.7 g/dL (ref 3.5–5.0)
ALKALINE PHOSPHATASE: 129 U/L — ABNORMAL HIGH (ref 38–126)
ALT (SGPT): 33 U/L (ref 19–72)
ANION GAP: 14 mmol/L (ref 9–15)
AST (SGOT): 103 U/L — ABNORMAL HIGH (ref 19–55)
BILIRUBIN TOTAL: 0.8 mg/dL (ref 0.0–1.2)
BLOOD UREA NITROGEN: 11 mg/dL (ref 7–21)
BUN / CREAT RATIO: 13
CHLORIDE: 104 mmol/L (ref 98–107)
CO2: 21 mmol/L — ABNORMAL LOW (ref 22.0–30.0)
CREATININE: 0.85 mg/dL (ref 0.70–1.30)
EGFR CKD-EPI AA MALE: >90 mL/min/{1.73_m2} (ref >=60)
EGFR CKD-EPI NON-AA MALE: >90 mL/min/{1.73_m2} (ref >=60)
GLUCOSE RANDOM: 177 mg/dL (ref 65–179)
POTASSIUM: 2.8 mmol/L — ABNORMAL LOW (ref 3.5–5.0)
PROTEIN TOTAL: 7.1 g/dL (ref 6.5–8.3)
SODIUM: 139 mmol/L (ref 135–145)

## 2018-07-15 LAB — CBC W/ AUTO DIFF
BASOPHILS ABSOLUTE COUNT: 0 10*9/L (ref 0.0–0.1)
BASOPHILS RELATIVE PERCENT: 0.6 %
EOSINOPHILS ABSOLUTE COUNT: 0.1 10*9/L (ref 0.0–0.4)
EOSINOPHILS RELATIVE PERCENT: 0.9 %
HEMATOCRIT: 33.7 % — ABNORMAL LOW (ref 41.0–53.0)
HEMOGLOBIN: 11.9 g/dL — ABNORMAL LOW (ref 13.5–17.5)
LARGE UNSTAINED CELLS: 1 % (ref 0–4)
LYMPHOCYTES ABSOLUTE COUNT: 1.1 10*9/L — ABNORMAL LOW (ref 1.5–5.0)
LYMPHOCYTES RELATIVE PERCENT: 15.1 %
MEAN CORPUSCULAR HEMOGLOBIN CONC: 35.4 g/dL (ref 31.0–37.0)
MEAN CORPUSCULAR HEMOGLOBIN: 35.6 pg — ABNORMAL HIGH (ref 26.0–34.0)
MEAN CORPUSCULAR VOLUME: 100.4 fL — ABNORMAL HIGH (ref 80.0–100.0)
MEAN PLATELET VOLUME: 8 fL (ref 7.0–10.0)
MONOCYTES RELATIVE PERCENT: 3.8 %
NEUTROPHILS ABSOLUTE COUNT: 5.6 10*9/L (ref 2.0–7.5)
NEUTROPHILS RELATIVE PERCENT: 78.5 %
PLATELET COUNT: 240 10*9/L (ref 150–440)
RED BLOOD CELL COUNT: 3.35 10*12/L — ABNORMAL LOW (ref 4.50–5.90)
WBC ADJUSTED: 7.1 10*9/L (ref 4.5–11.0)

## 2018-07-15 LAB — PROSTATE SPECIFIC ANTIGEN: Prostate specific Ag:MCnc:Pt:Ser/Plas:Qn:: 6.33 — ABNORMAL HIGH

## 2018-07-15 LAB — EOSINOPHILS ABSOLUTE COUNT: Lab: 0.1

## 2018-07-15 LAB — ALBUMIN: Albumin:MCnc:Pt:Ser/Plas:Qn:: 3.7

## 2018-07-15 MED ORDER — PROCHLORPERAZINE MALEATE 10 MG TABLET
ORAL_TABLET | Freq: Four times a day (QID) | ORAL | 0 refills | 0.00000 days | Status: CP | PRN
Start: 2018-07-15 — End: 2018-07-22

## 2018-07-15 MED ORDER — OXYCODONE 5 MG TABLET
ORAL_TABLET | Freq: Three times a day (TID) | ORAL | 0 refills | 0 days | Status: CP | PRN
Start: 2018-07-15 — End: 2018-08-25

## 2018-07-15 MED ORDER — DEXAMETHASONE 4 MG TABLET
ORAL_TABLET | Freq: Two times a day (BID) | ORAL | 0 refills | 0 days | Status: CP
Start: 2018-07-15 — End: 2019-02-02

## 2018-07-15 NOTE — Unmapped (Signed)
[place] INTERVENTIONAL RADIOLOGY - Operative Note     VIR Post-Procedure Note    Procedure Name: Single lumen chest port placement    Pre-Op Diagnosis: Prostate cancer    Post-Op Diagnosis: Same as pre-operative diagnosis    VIR Providers    Attending: Janey Genta, MD, PhD  Resident/Fellow/APP: None    Description of procedure: Successful placement of a single lumen chest port via the right internal jugular vein.     Estimated Blood Loss: approximately 2 mL  Complications: None    See detailed procedure note with images in PACS [facility]).    The patient tolerated the procedure well without incident or complication and left the room in stable condition.    Janey Genta, MD, PhD  07/15/2018 9:09 AM

## 2018-07-15 NOTE — Unmapped (Signed)
Pharmacy: First Cycle Chemotherapy Patient Education    Chemotherapy regimen/agents: docetaxel/prednisone  Day 1 of chemotherapy: 07/15/18    I offered counseled to Joseph Phillips about his new chemotherapy regimen and he declined saying he had already received information.      I offered a handout from the [facility] patient education library, which he declined.  We talked about how to take two home medications:  Dexamethasone and prochlorperazine.  These were not released as part of his treatment plan so I resent those prescriptions to [facility].  [name] said he also ran out of oxycodone.  I relayed this information to Dr. Claude Manges, with a request for a prescription.      Approximate time counseling patient and coordinating new prescriptions - 25 minutes.      Thank you, please feel free to page with any questions or concerns.  Gardner Candle, PharmD, BCOP, CPP

## 2018-07-15 NOTE — Unmapped (Signed)
VS taken. Labs drawn via [facility].    Port flushed and brisk blood return     Premeds and IV fluids given.    Pt received Docetaxel - no adverse events noted.     AVS given.    Pt stable and left clinic, ambulatory/independent.

## 2018-07-15 NOTE — Unmapped (Signed)
[place] INTERVENTIONAL RADIOLOGY - Pre Procedure H&P      Assessment/Plan:    [name] is a 57 y.o. male who will undergo single lumen chest port placement in Interventional Radiology.    --This procedure has been fully reviewed with the patient/patient???s authorized representative. The risks, benefits and alternatives have been explained, and the patient/patient???s authorized representative has consented to the procedure.  --The patient will accept blood products in an emergent situation.  --The patient does not have a Do Not Resuscitate order in effect.      HPI: [name] is a 57 y.o. male with history of prostate cancer presenting for single lumen chest port placement.    Allergies:   Allergies   Allergen Reactions   ??? Penicillins      Other reaction(s): UNKNOWN   ??? Tramadol Dizziness       Medications:  No relevant medications, please see full medication list in Epic.    ASA Grade: ASA 2 - Patient with mild systemic disease with no functional limitations    PE:    Vitals:    07/15/18 0801   BP: 144/94   Pulse: 111   Resp: 20   Temp: 36.5 ??C (97.7 ??F)   SpO2: 99%     General: male in NAD.  Airway assessment: Class 2 - Can visualize soft palate and fauces, tip of uvula is obscured  Lungs: Respirations nonlabored    Janey Genta, MD, PhD  07/15/2018 8:14 AM

## 2018-07-22 ENCOUNTER — Ambulatory Visit: Admit: 2018-07-22 | Discharge: 2018-07-24 | Payer: MEDICAID

## 2018-07-22 DIAGNOSIS — G4733 Obstructive sleep apnea (adult) (pediatric): Principal | ICD-10-CM

## 2018-07-28 NOTE — Unmapped (Signed)
As directed by Dr. Claude Manges, I have contacted the patient and reviewed with him the following:    ?? The results of the sleep study completed on 07/22/2018 have been reviewed by Dr. Claude Manges and the patient has been referred to the [facility] at this time for further evaluation.    ?? I have verified that the [facility] has received our referral, the appointment request has been approved and the patient is to be contacted to schedule his initial consultation.    The information listed above was reviewed with the patient who expressed verbal understanding and had no other questions or concerns at this time and is agreeable to our plan.  The patient is to call the clinic back sooner should he have any other issues.    No other actions taken and Dr. Claude Manges is aware.

## 2018-08-04 ENCOUNTER — Ambulatory Visit
Admit: 2018-08-04 | Discharge: 2018-08-05 | Payer: MEDICAID | Attending: Hematology & Oncology | Primary: Hematology & Oncology

## 2018-08-04 ENCOUNTER — Ambulatory Visit: Admit: 2018-08-04 | Discharge: 2018-08-05 | Payer: MEDICAID

## 2018-08-04 DIAGNOSIS — D649 Anemia, unspecified: Secondary | ICD-10-CM

## 2018-08-04 DIAGNOSIS — C61 Malignant neoplasm of prostate: Principal | ICD-10-CM

## 2018-08-04 DIAGNOSIS — G2581 Restless legs syndrome: Secondary | ICD-10-CM

## 2018-08-04 LAB — COMPREHENSIVE METABOLIC PANEL
ALBUMIN: 4.3 g/dL (ref 3.5–5.0)
ALKALINE PHOSPHATASE: 95 U/L (ref 38–126)
ALT (SGPT): 54 U/L (ref 19–72)
AST (SGOT): 163 U/L — ABNORMAL HIGH (ref 19–55)
BLOOD UREA NITROGEN: 15 mg/dL (ref 7–21)
BUN / CREAT RATIO: 18
CALCIUM: 10.1 mg/dL (ref 8.5–10.2)
CHLORIDE: 108 mmol/L — ABNORMAL HIGH (ref 98–107)
CO2: 21 mmol/L — ABNORMAL LOW (ref 22.0–30.0)
CREATININE: 0.83 mg/dL (ref 0.70–1.30)
EGFR CKD-EPI AA MALE: >90 mL/min/{1.73_m2} (ref >=60)
EGFR CKD-EPI NON-AA MALE: >90 mL/min/{1.73_m2} (ref >=60)
GLUCOSE RANDOM: 126 mg/dL (ref 65–179)
POTASSIUM: 4.4 mmol/L (ref 3.5–5.0)
PROTEIN TOTAL: 7.3 g/dL (ref 6.5–8.3)
SODIUM: 141 mmol/L (ref 135–145)

## 2018-08-04 LAB — CBC W/ AUTO DIFF
BASOPHILS ABSOLUTE COUNT: 0 10*9/L (ref 0.0–0.1)
EOSINOPHILS ABSOLUTE COUNT: 0 10*9/L (ref 0.0–0.4)
EOSINOPHILS RELATIVE PERCENT: 0.2 %
HEMATOCRIT: 32 % — ABNORMAL LOW (ref 41.0–53.0)
HEMOGLOBIN: 11.4 g/dL — ABNORMAL LOW (ref 13.5–17.5)
LARGE UNSTAINED CELLS: 1 % (ref 0–4)
LYMPHOCYTES ABSOLUTE COUNT: 0.9 10*9/L — ABNORMAL LOW (ref 1.5–5.0)
LYMPHOCYTES RELATIVE PERCENT: 7.1 %
MEAN CORPUSCULAR HEMOGLOBIN: 35.9 pg — ABNORMAL HIGH (ref 26.0–34.0)
MEAN CORPUSCULAR VOLUME: 100.7 fL — ABNORMAL HIGH (ref 80.0–100.0)
MEAN PLATELET VOLUME: 7.8 fL (ref 7.0–10.0)
MONOCYTES ABSOLUTE COUNT: 0.5 10*9/L (ref 0.2–0.8)
MONOCYTES RELATIVE PERCENT: 4.2 %
NEUTROPHILS ABSOLUTE COUNT: 10.4 10*9/L — ABNORMAL HIGH (ref 2.0–7.5)
NEUTROPHILS RELATIVE PERCENT: 87.3 %
RED BLOOD CELL COUNT: 3.18 10*12/L — ABNORMAL LOW (ref 4.50–5.90)
RED CELL DISTRIBUTION WIDTH: 13.5 % (ref 12.0–15.0)
WBC ADJUSTED: 11.9 10*9/L — ABNORMAL HIGH (ref 4.5–11.0)

## 2018-08-04 LAB — PROSTATE SPECIFIC ANTIGEN: Prostate specific Ag:MCnc:Pt:Ser/Plas:Qn:: 5.61 — ABNORMAL HIGH

## 2018-08-04 LAB — BLOOD UREA NITROGEN: Urea nitrogen:MCnc:Pt:Ser/Plas:Qn:: 15

## 2018-08-04 LAB — FERRITIN
FERRITIN: 210 ng/mL (ref 27.0–377.0)
Ferritin:MCnc:Pt:Ser/Plas:Qn:: 210

## 2018-08-04 LAB — IRON: Iron:MCnc:Pt:Ser/Plas:Qn:: 113

## 2018-08-04 LAB — IRON & TIBC
IRON SATURATION (CALC): 39 % (ref 20–50)
TRANSFERRIN: 228.7 mg/dL (ref 200.0–380.0)

## 2018-08-04 LAB — BASOPHILS ABSOLUTE COUNT: Lab: 0

## 2018-08-04 NOTE — Unmapped (Signed)
If you feel like this is an emergency please call 911.  For appointments or questions Monday through Friday 8AM-5PM please call [phone] or Toll Free [phone]. For Medical questions or concerns ask for the Nurse Triage Line.  On Nights, Weekends, and Holidays call [phone] and ask for the Oncologist on Call.  Reasons to call the Nurse Triage Line:  Fever of 100.5 or greater  Nausea and/or vomiting not relived with nausea medicine  Diarrhea or constipation  Severe pain not relieved with usual pain regimen  Shortness of breath  Uncontrolled bleeding  Mental status changes    Office Visit on 08/04/2018   Component Date Value Ref Range Status   ??? Sodium 08/04/2018 141  135 - 145 mmol/L Final   ??? Potassium 08/04/2018 4.4  3.5 - 5.0 mmol/L Final   ??? Chloride 08/04/2018 108* 98 - 107 mmol/L Final   ??? CO2 08/04/2018 21.0* 22.0 - 30.0 mmol/L Final   ??? Anion Gap 08/04/2018 12  9 - 15 mmol/L Final   ??? BUN 08/04/2018 15  7 - 21 mg/dL Final   ??? Creatinine 08/04/2018 0.83  0.70 - 1.30 mg/dL Final   ??? BUN/Creatinine Ratio 08/04/2018 18   Final   ??? EGFR CKD-EPI Non-African American,* 08/04/2018 >90  >=60 mL/min/1.54m2 Final   ??? EGFR CKD-EPI African American, Male 08/04/2018 >90  >=60 mL/min/1.27m2 Final   ??? Glucose 08/04/2018 126  65 - 179 mg/dL Final   ??? Calcium 57/84/6962 10.1  8.5 - 10.2 mg/dL Final   ??? Albumin 95/28/4132 4.3  3.5 - 5.0 g/dL Final   ??? Total Protein 08/04/2018 7.3  6.5 - 8.3 g/dL Final   ??? Total Bilirubin 08/04/2018 0.5  0.0 - 1.2 mg/dL Final   ??? AST 44/12/270 163* 19 - 55 U/L Final   ??? ALT 08/04/2018 54  19 - 72 U/L Final   ??? Alkaline Phosphatase 08/04/2018 95  38 - 126 U/L Final   ??? PSA 08/04/2018 5.61* 0.00 - 4.00 ng/mL Final   ??? WBC 08/04/2018 11.9* 4.5 - 11.0 10*9/L Final   ??? RBC 08/04/2018 3.18* 4.50 - 5.90 10*12/L Final   ??? HGB 08/04/2018 11.4* 13.5 - 17.5 g/dL Final   ??? HCT 53/66/4403 32.0* 41.0 - 53.0 % Final   ??? MCV 08/04/2018 100.7* 80.0 - 100.0 fL Final   ??? MCH 08/04/2018 35.9* 26.0 - 34.0 pg Final   ??? MCHC 08/04/2018 35.7  31.0 - 37.0 g/dL Final   ??? RDW 47/42/5956 13.5  12.0 - 15.0 % Final   ??? MPV 08/04/2018 7.8  7.0 - 10.0 fL Final   ??? Platelet 08/04/2018 248  150 - 440 10*9/L Final   ??? Neutrophils % 08/04/2018 87.3  % Final   ??? Lymphocytes % 08/04/2018 7.1  % Final   ??? Monocytes % 08/04/2018 4.2  % Final   ??? Eosinophils % 08/04/2018 0.2  % Final   ??? Basophils % 08/04/2018 0.3  % Final   ??? Absolute Neutrophils 08/04/2018 10.4* 2.0 - 7.5 10*9/L Final   ??? Absolute Lymphocytes 08/04/2018 0.9* 1.5 - 5.0 10*9/L Final   ??? Absolute Monocytes 08/04/2018 0.5  0.2 - 0.8 10*9/L Final   ??? Absolute Eosinophils 08/04/2018 0.0  0.0 - 0.4 10*9/L Final   ??? Absolute Basophils 08/04/2018 0.0  0.0 - 0.1 10*9/L Final   ??? Large Unstained Cells 08/04/2018 1  0 - 4 % Final   ??? Macrocytosis 08/04/2018 Slight* Not Present Final

## 2018-08-04 NOTE — Unmapped (Signed)
[name] arrived in no acute distress. Resting comfortably at this time.    Patient tolerated infusion with no adverse reactions noted. Port flushed and deaccessed per protocol. AVS given. Left clinic ambulatory in stable condition at time of discharge.

## 2018-08-04 NOTE — Unmapped (Signed)
Initial Visit Note    Patient Name: Joseph Phillips  Patient Age: 57 y.o.  Encounter Date: 08/04/2018    Referring Physician:     Towanda Malkin, MD  [address]  [id number]  [place], [place] [address]    Primary Care Provider:  Unknown Foley, MD    Assessment/Plan:    Reason for visit:  Prostate Cancer    Cancer Staging  Prostate cancer (CMS-HCC)  Staging form: Prostate, AJCC 7th Edition  - Clinical: Stage IV (T3a, N0, M1b, PSA: Less than 10, Gleason 7) - Unsigned      --Prostate cancer, stage IV, with progression on Lupron/Zytiga  --Hypokalemia  --Hypomagnesemia  --Leg cramps and pain, exacerbated by electrolyte disturbances  --Teeth in poor repair, awaiting dental correction  --Hypertension  --Lower extremity swelling, exacerbated at night; improved following increased HCTZ  -- OSA; positive study for both central and obstructive apnea; CPAP trial recommended, 07/23/2018  --History of pancreatitis with pancreatic insufficiency    Recommendation  --Patient is tolerated the first cycle of docetaxel, has evidence of response with normalization of alkaline phosphatase.  PSA is pending.  His cell counts are adequate therapy will proceed with cycle 2.  --Discussed sending the patient back for a trial of CPAP at the sleep clinic; he is asked to defer that intervention for the present time.  --I have asked the patient to recontact his dentist, to see if he has clearance for Rivka Barbara    I have reviewed the laboratory, pathology, and radiology reports in detail and discussed findings with the patient    History of Present Illness:     Joseph Phillips is a 57 y.o. male who is seen in consultation at the request of Lonn Georgia* for an evaluation of Prostate Cancer.  Patient returns after his first cycle of docetaxel.  He tolerated therapy reasonably well.  He has experienced some hair loss.  His appetite is excellent.  He had a sleep study which showed central and obstructive apneic spells.  Dr. Silas Flood, neurologist, recommended the study be repeated with a trial of CPAP.  He is not experiencing pain, fevers, chills, shortness of breath, vomiting, constipation, diarrhea, difficulty passing urine.  Patient has seen a dentist (Dr. Magnus Sinning in [place]) and is slated to have a cleaning and partial plate formed, but was told to come back after he completed chemotherapy.  The patient did not have the opportunity to ask whether he is a candidate for Xgeva at this point or not.            Oncology History:  Patient was originally diagnosed with prostate cancer in 2006.  He underwent robot-assisted prostatectomy.  He did well until May 2018, when his PSA rose to a high of about 45, and he was started on Lupron, initially with Casodex, and then with apparatus.  His PSA fell and remained low until recently when he was noted to be rising from a low of 0.82 on  01/01/18, to 3.99 on 06/29/2018.  He was instructed to hold Zytiga.  Since holding Zytiga he is noted swelling in his ankles.  The swelling comes on overnight, improves during the course the day.  He was evaluated with Doppler studies on 06/29/2018 which showed no evidence of blood clots.  He is still bothered by the swelling.  Restaging studies done on 06/04/2018 including bone scan, and CT of the chest abdomen pelvis, showed sclerotic changes throughout the axial skeleton, and involvement also of scapula,  some ribs and pelvis.  The sites were new since 2016, and also probably new since the CT stone study in May 2018.  Bone scan also showed some progression in disease by report, compared to 2018.  He has developed pain in the back of his calves, for which he takes oxycodone (Tylenol and ibuprofen were ineffective).  The pain followed prolonged episodes of cramping, felt to be related to low magnesium and potassium.  About 6 months ago he had shingles in a left L2 distribution.           Prostate cancer (CMS-HCC)    12/02/2008 Biopsy     Prostate, left apex, needle biopsy  - Adenocarcinoma, Gleason combined score 6 (3+3), 2 of 2 cores involved, tumor  approximately 2 mm in greatest diameter, approximately 10% of tissue involved by  carcinoma.  - See comment    E: ??Prostate, left mid, needle biopsy  - Adenocarcinoma, Gleason combined score 6 (3+3), 1 of 2 cores involved, tumor  approximately 2 mm in greatest diameter, approximately 5% of tissue involved by  carcinoma.  - See comment    F: ??Prostate, left base, needle biopsy  - Adenocarcinoma, Gleason combined score 6 (3+3), 1 of 2 cores involved, tumor  approximately 1 mm in greatest diameter, approximately 5% of tissue involved by  carcinoma.      01/24/2009 Surgery     Prostate, robot assisted laparoscopic prostatectomy  Tumor histologic type: Prostatic adenocarcinoma  Tumor grade (Gleason system): Gleason pattern 7 (3+4)  Approximate percentage of prostate involved by tumor: 40%  Extent of tumor:  Location of tumor involvement in prostate: right and left lobes, anterior  and posterior, apex to base  Confinement/non-confinement of tumor within the prostatic capsule: ??not  confined  Location of extracapsular tumor involvement: ??left prostate toward the base  (ie. block A22)  Type of extracapsular tissue involved within tumor: ?? adipose  tissue/perineural tissue  Angiolymphatic space invasion: not identified  Seminal vesicles: negative  Vas deferens: negative  Histologic assessment of surgical margins: positive margin along a 3 mm  face in the left prostate base (block A25)    Other significant findings: High grade prostatic intraepithelial  Neoplasm;  Lymph nodes: Not applicable  AJCC Staging: pT3a pNx      08/30/2013 Initial Diagnosis     Prostate cancer (CMS-HCC)      04/30/2017 -  Chemotherapy     Lupron 22.5 mg; with bicalutamide x  month      08/01/2017 - 06/30/2018 Chemotherapy     Abiaterone 1000 mg daily; decrease to 750 mg daily at 10/2018;      10/30/2017 -  Cancer Staged     Starta: TP53, TMPSSR  -EGR fusion; no other abnormalities identified      06/04/2018 -  Cancer Staged     CT CAP:  Scattered diffuse sclerotic metastatic lesions are seen throughout the spine as well as the left scapula; some of the lesions are new since prior CT dated 04/20/2015.  - No intrathoracic metastatic disease.  Interval progression of osseous metastases throughout the spine and pelvis compared to 04/22/2017.  -- Cholelithiasis.  -- Hepatic steatosis.      06/04/2018 -  Cancer Staged     Bone scan:  Increased radiotracer uptake throughout the spine, sacrum, and left ilium, consistent with numerous metastases better evaluated on same-day CT. Unchanged metastasis to the left proximal humerus.      07/14/2018 -  Chemotherapy     Chemotherapy Treatment  Treatment Goal Control   Line of Treatment [No plan line of treatment]   Plan Name OP PROSTATE DOCETAXEL/PREDNISONE   Start Date 07/14/2018   End Date 10/28/2018 (Planned)   Provider Towanda Malkin, MD   Chemotherapy dexAMETHasone (DECADRON) tablet 8 mg, 8 mg, Oral, Once, 1 of 6 cycles  Administration: 8 mg (07/15/2018)  DOCEtaxel (TAXOTERE) 110.4 mg in sodium chloride 0.9% NON-PVC (NS) 0.9 % 250 mL IVPB, 60 mg/m2 = 110.4 mg (80 % of original dose 75 mg/m2), Intravenous, Once, 1 of 6 cycles  Dose modification: 60 mg/m2 (80 % of original dose 75 mg/m2, Cycle 1, Reason: Dose Not Tolerated)  Administration: 110.4 mg (07/15/2018)            Past Medical History:   Diagnosis Date   ??? Hx of radiation therapy    ??? Hypertension    ??? Prostate cancer (CMS-HCC)       Past Surgical History:   Procedure Laterality Date   ??? IR INSERT PORT AGE GREATER THAN 5 YRS  07/15/2018    IR INSERT PORT AGE GREATER THAN 5 YRS 07/15/2018 Carolin Coy, MD [facility]   ??? PR COLSC FLX W/RMVL OF TUMOR POLYP LESION SNARE TQ N/A 08/16/2015    Procedure: COLONOSCOPY FLEX; W/REMOV TUMOR/LES BY SNARE;  Surgeon: Gwen Pounds, MD;  [facility]: [facility];  Service: Gastroenterology   ??? PR COLSC FLX WITH DIRECTED SUBMUCOSAL NJX ANY SBST N/A 08/16/2015    Procedure: COLONOSCOPY, FLEXIBLE, PROXIMAL TO SPLENIC FLEXURE; WITH DIRECTED SUBMUCOSAL INJECTION(S), ANY SUBSTANCE;  Surgeon: Gwen Pounds, MD;  Location: [facility];  Service: Gastroenterology   ??? PROSTATE SURGERY          Family History   Problem Relation Age of Onset   ??? Melanoma Neg Hx    ??? Basal cell carcinoma Neg Hx    ??? Squamous cell carcinoma Neg Hx       No family status information on file.     Social History     Occupational History   ??? Not on file   Tobacco Use   ??? Smoking status: Current Some Day Smoker     Packs/day: 0.30     Years: 21.00     Pack years: 6.30     Types: Cigarettes     Start date: 05/09/1994   ??? Smokeless tobacco: Never Used   ??? Tobacco comment: states only smoke about 3  aday   Substance and Sexual Activity   ??? Alcohol use: Yes     Alcohol/week: 2.0 standard drinks     Types: 2 Cans of beer per week   ??? Drug use: No   ??? Sexual activity: Not on file       Allergies   Allergen Reactions   ??? Penicillins      Other reaction(s): UNKNOWN   ??? Tramadol Dizziness       Current Outpatient Medications   Medication Sig Dispense Refill   ??? amLODIPine (NORVASC) 10 MG tablet Take 1 tablet (10 mg total) by mouth daily. 90 tablet 3   ??? b complex vitamins capsule Take 1 capsule by mouth daily. 30 capsule 6   ??? busPIRone (BUSPAR) 5 MG tablet Take 1 tablet (5 mg total) by mouth Three (3) times a day. 270 tablet 3   ??? dexAMETHasone (DECADRON) 4 MG tablet Take 2 tablets (8 mg total) by mouth Two (2) times a day. Take for two days after chemotherapy  only. 48 tablet 0   ??? hydroCHLOROthiazide (HYDRODIURIL) 25 MG tablet take 1 tablet by mouth once daily 90 tablet 3   ??? magnesium oxide (MAGOX) 400 mg (241.3 mg magnesium) tablet Take 1 tablet (400 mg total) by mouth Two (2) times a day. 60 tablet 6   ??? omeprazole (PRILOSEC) 20 MG capsule take 2 capsule by mouth once daily 180 capsule 3   ??? oxyCODONE (ROXICODONE) 5 MG immediate release tablet Take 1 tablet (5 mg total) by mouth every eight (8) hours as needed for pain. 100 tablet 0   ??? pancrelipase, Lip-Prot-Amyl, (CREON) 24,000-76,000 -120,000 unit CpDR Take 1 capsule (24,000 units of lipase total) by mouth Three (3) times a day with a meal. Take 2 caps with meals, 1 cap with snacks 180 capsule 3   ??? potassium chloride SA (K-DUR,KLOR-CON) 20 MEQ tablet Take 3 tablets (60 mEq total) by mouth Two (2) times a day. 180 tablet 3   ??? tiotropium (SPIRIVA WITH HANDIHALER) 18 mcg inhalation capsule Place 1 capsule (18 mcg total) into inhaler and inhale once daily. 90 capsule 3   ??? traZODone (DESYREL) 100 MG tablet take 1/2 tablet by mouth at bedtime if needed for sleep 135 tablet 1   ??? triamcinolone (KENALOG) 0.1 % ointment Apply to thin areas of rash twice daily as needed 454 g 1     No current facility-administered medications for this visit.      Facility-Administered Medications Ordered in Other Visits   Medication Dose Route Frequency Provider Last Rate Last Dose   ??? DOCEtaxel (TAXOTERE) 138 mg in sodium chloride 0.9% NON-PVC (NS) 0.9 % 250 mL IVPB  75 mg/m2 (Treatment Plan Recorded) Intravenous Once Towanda Malkin, MD           The following portions of the patient's history were reviewed and updated as appropriate: allergies, current medications, past family history, past medical history, past social history, past surgical history and problem list.     Review of Systems   Constitutional: Positive for fatigue. Negative for appetite change, chills, diaphoresis, fever and unexpected weight change.   HENT:   Negative for mouth sores, nosebleeds and trouble swallowing.    Eyes: Negative for eye problems.   Respiratory: Negative for chest tightness, cough, hemoptysis and shortness of breath.    Cardiovascular: Negative for chest pain and leg swelling.   Gastrointestinal: Negative for abdominal pain, blood in stool, constipation, diarrhea and nausea.   Genitourinary: Negative for difficulty urinating and hematuria.    Musculoskeletal: Positive for back pain and myalgias. Negative for arthralgias.   Skin: Negative for rash.   Neurological: Negative for extremity weakness, headaches and seizures.   Hematological: Negative for adenopathy.   Psychiatric/Behavioral: Positive for depression. Negative for confusion.   All other systems reviewed and are negative.        Vital Signs for this encounter:  BSA: There is no height or weight on file to calculate BSA.  There were no vitals taken for this visit.    Physical Exam   Constitutional: He is oriented to person, place, and time.   Ill-appearing African-American gentleman, some swelling in the lower face, in no acute distress.   HENT:   Many teeth missing, remaining teeth with significant gum disease.   Neck: No JVD present. No thyromegaly present.   Cardiovascular: Normal rate, regular rhythm and normal heart sounds.   Pulmonary/Chest: Effort normal and breath sounds normal.   Abdominal: Bowel sounds are normal. He exhibits no distension  and no mass. There is no tenderness.   Musculoskeletal: He exhibits no tenderness.   There is trace  edema in bilateral feet and ankles.   Lymphadenopathy:     He has no cervical adenopathy.   No adenopathy neck, supraclavicular, axillary inguinal regions.   Neurological: He is alert and oriented to person, place, and time.   Skin: No rash noted.   Psychiatric: He has a normal mood and affect. His behavior is normal.       Karnofsky/Lansky Performance Status  80, Normal activity with effort; some signs or symptoms of disease (ECOG equivalent 1)    Results:    WBC   Date Value Ref Range Status   08/04/2018 11.9 (H) 4.5 - 11.0 10*9/L Final   04/17/2012 7.6 4.5 - 11.0 x10 9th/L Final     HGB   Date Value Ref Range Status   08/04/2018 11.4 (L) 13.5 - 17.5 g/dL Final   28/41/3244 01.0 13.5 - 17.5 G/DL Final     HCT   Date Value Ref Range Status   08/04/2018 32.0 (L) 41.0 - 53.0 % Final   04/17/2012 39.2 (L) 41.0 - 53.0 % Final     Platelet Date Value Ref Range Status   08/04/2018 248 150 - 440 10*9/L Final   04/17/2012 242 150 - 440 x10 9th/L Final     Creatinine Whole Blood, POC   Date Value Ref Range Status   07/25/2014 1.6 (H) 0.8 - 1.4 mg/dL Final     Creatinine   Date Value Ref Range Status   08/04/2018 0.83 0.70 - 1.30 mg/dL Final   27/25/3664 4.03 0.70 - 1.30 mg/dL Final     Creatinine, Whole Blood   Date Value Ref Range Status   07/25/2014 1.4 0.8 - 1.4 mg/dL Final     AST   Date Value Ref Range Status   08/04/2018 163 (H) 19 - 55 U/L Final   09/11/2011 38 19 - 55 U/L Final     PSA Diagnostic   Date Value Ref Range Status   09/28/2014 4.4 (H) 0.0 - 4.0 ng/mL Final   05/30/2014 5.0 (H) 0.0 - 4.0 ng/mL Final   03/23/2014 4.6 (H) 0.0 - 4.0 ng/mL Final   10/04/2013 3.7 0.0 - 4.0 ng/mL Final   06/24/2013 2.6 0.0 - 4.0 ng/mL Final   01/18/2013 1.6 0.0 - 4.0 NG/ML Final   09/28/2012 1.5 0.0 - 4.0 NG/ML Final   07/03/2012 1.0 0.0 - 4.0 NG/ML Final   12/30/2011 0.5 0.0 - 4.0 NG/ML Final   07/15/2011 0.3 0.0 - 4.0 NG/ML Final   04/08/2011 0.3 0.0 - 4.0 NG/ML Final

## 2018-08-04 NOTE — Unmapped (Addendum)
--  Please call your dentist, and ask him if you are cleared to start on a drug called Rivka Barbara (also known as denosumab).  If you are, let us know and we will arrange for adding this drug to your chemotherapy.  If you are not, let us know so that we can make note.  --Plan to have you back in 3 weeks for the next cycle of therapy, assuming no new problems arise.  I will plan to see her back in 6 weeks prior to the fourth cycle of therapy.

## 2018-08-04 NOTE — Unmapped (Signed)
Labs drawn via [place].    Port flushed and brisk blood return     Pt tolerated well.

## 2018-08-25 ENCOUNTER — Ambulatory Visit: Admit: 2018-08-25 | Discharge: 2018-08-26 | Payer: MEDICAID

## 2018-08-25 DIAGNOSIS — C61 Malignant neoplasm of prostate: Principal | ICD-10-CM

## 2018-08-25 MED ORDER — MAGNESIUM OXIDE 400 MG (241.3 MG MAGNESIUM) TABLET
ORAL_TABLET | 0 refills | 0 days | Status: CP
Start: 2018-08-25 — End: 2018-09-08

## 2018-08-25 MED ORDER — OXYCODONE 5 MG TABLET
ORAL_TABLET | Freq: Three times a day (TID) | ORAL | 0 refills | 0 days | Status: CP | PRN
Start: 2018-08-25 — End: 2018-09-28

## 2018-08-26 MED ORDER — NAPROXEN 500 MG TABLET
ORAL_TABLET | 0 refills | 0 days | Status: SS
Start: 2018-08-26 — End: 2018-11-12

## 2018-09-09 MED ORDER — MAGNESIUM OXIDE 400 MG (241.3 MG MAGNESIUM) TABLET
ORAL_TABLET | 0 refills | 0 days | Status: CP
Start: 2018-09-09 — End: 2019-01-19

## 2018-09-09 MED ORDER — PREDNISONE 5 MG TABLET
ORAL_TABLET | Freq: Two times a day (BID) | ORAL | 11 refills | 0 days | Status: CP
Start: 2018-09-09 — End: 2018-09-15

## 2018-09-15 ENCOUNTER — Ambulatory Visit: Admit: 2018-09-15 | Discharge: 2018-09-15 | Payer: MEDICAID

## 2018-09-15 ENCOUNTER — Ambulatory Visit
Admit: 2018-09-15 | Discharge: 2018-09-15 | Payer: MEDICAID | Attending: Hematology & Oncology | Primary: Hematology & Oncology

## 2018-09-15 DIAGNOSIS — C61 Malignant neoplasm of prostate: Principal | ICD-10-CM

## 2018-09-15 DIAGNOSIS — E876 Hypokalemia: Secondary | ICD-10-CM

## 2018-09-15 MED ORDER — TRIAMTERENE 37.5 MG-HYDROCHLOROTHIAZIDE 25 MG TABLET
ORAL_TABLET | Freq: Every day | ORAL | 11 refills | 0.00000 days | Status: CP
Start: 2018-09-15 — End: 2018-11-23

## 2018-09-17 MED ORDER — LIPASE-PROTEASE-AMYLASE 24,000-76,000-120,000 UNIT CAPSULE,DELAYED REL
ORAL_CAPSULE | Freq: Three times a day (TID) | ORAL | 3 refills | 0 days | Status: SS
Start: 2018-09-17 — End: 2018-11-12
  Filled 2018-09-22: qty 120, 30d supply, fill #0

## 2018-09-17 MED ORDER — LIPASE-PROTEASE-AMYLASE 24,000-76,000-120,000 UNIT CAPSULE,DELAYED REL: 24000 [IU] | capsule | Freq: Three times a day (TID) | 3 refills | 0 days | Status: AC

## 2018-09-17 NOTE — Unmapped (Signed)
[facility] Medication Referral: No PA required    Medication (Brand/Generic): Creon    Initial Benefits Investigation Claim completed with resulted information below:  No PA required  Patient ABLE to fill at [facility]  Insurance Company:  Medicaid  Anticipated Copay: $3    As Co-pay is under $25 defined limit, per policy there will be no further investigation of need for financial assistance at this time unless patient requests. This referral has been communicated to the provider and handed off to the [facility] team for further processing and filling of prescribed medication.   ______________________________________________________________________  Please utilize this referral for viewing purposes as it will serve as the central location for all relevant documentation and updates.

## 2018-09-21 NOTE — Unmapped (Signed)
[name] has been on Creon for some time and requests to opt out of specialty pharmacy outreach calls.  He and his caretaker voiced understanding that they are to call us to schedule any future fills of their medications.

## 2018-09-22 MED FILL — CREON 24,000-76,000-120,000 UNIT CAPSULE,DELAYED RELEASE: 30 days supply | Qty: 120 | Fill #0 | Status: AC

## 2018-09-25 ENCOUNTER — Ambulatory Visit: Admit: 2018-09-25 | Discharge: 2018-09-27 | Payer: MEDICAID

## 2018-09-25 DIAGNOSIS — K121 Other forms of stomatitis: Principal | ICD-10-CM

## 2018-09-26 DIAGNOSIS — K121 Other forms of stomatitis: Principal | ICD-10-CM

## 2018-09-26 MED ORDER — MAGNESIUM OXIDE 400 MG (241.3 MG MAGNESIUM) TABLET: 400 mg | tablet | Freq: Three times a day (TID) | 5 refills | 0 days | Status: AC

## 2018-09-26 MED ORDER — LIDOCAINE HCL 2 % MUCOSAL SOLUTION
OROMUCOSAL | 1 refills | 0 days | Status: CP | PRN
Start: 2018-09-26 — End: 2018-10-05

## 2018-09-26 MED ORDER — ALBUTEROL SULFATE HFA 90 MCG/ACTUATION AEROSOL INHALER
Freq: Four times a day (QID) | RESPIRATORY_TRACT | 2 refills | 0.00000 days | Status: CP | PRN
Start: 2018-09-26 — End: 2019-06-14

## 2018-09-26 MED ORDER — VITAMIN B COMPLEX CAPSULE
ORAL_CAPSULE | Freq: Every day | ORAL | 5 refills | 0.00000 days | Status: CP
Start: 2018-09-26 — End: 2018-11-23

## 2018-09-26 MED ORDER — MAGIC MOUTHWASH ORAL SUSPENSION
Freq: Four times a day (QID) | ORAL | 1 refills | 0 days | Status: CP | PRN
Start: 2018-09-26 — End: 2018-11-23

## 2018-09-26 MED ORDER — MAGNESIUM OXIDE 400 MG (241.3 MG MAGNESIUM) TABLET
ORAL_TABLET | Freq: Two times a day (BID) | ORAL | 5 refills | 0 days | Status: CP
Start: 2018-09-26 — End: 2018-09-26

## 2018-09-27 MED ORDER — AZITHROMYCIN 500 MG TABLET
ORAL_TABLET | Freq: Every day | ORAL | 0 refills | 0.00000 days | Status: CP
Start: 2018-09-27 — End: 2018-10-23

## 2018-09-28 MED ORDER — OXYCODONE 5 MG TABLET
ORAL_TABLET | Freq: Three times a day (TID) | ORAL | 0 refills | 0.00000 days | Status: CP | PRN
Start: 2018-09-28 — End: 2018-09-29

## 2018-09-29 MED ORDER — OXYCODONE 5 MG TABLET
ORAL_TABLET | Freq: Three times a day (TID) | ORAL | 0 refills | 0.00000 days | Status: CP | PRN
Start: 2018-09-29 — End: 2018-10-23

## 2018-10-01 ENCOUNTER — Ambulatory Visit
Admit: 2018-10-01 | Discharge: 2018-10-02 | Payer: MEDICAID | Attending: Hematology & Oncology | Primary: Hematology & Oncology

## 2018-10-01 DIAGNOSIS — E876 Hypokalemia: Principal | ICD-10-CM

## 2018-10-05 ENCOUNTER — Ambulatory Visit: Admit: 2018-10-05 | Discharge: 2018-10-06 | Payer: MEDICAID

## 2018-10-05 ENCOUNTER — Ambulatory Visit
Admit: 2018-10-05 | Discharge: 2018-10-06 | Payer: MEDICAID | Attending: Student in an Organized Health Care Education/Training Program | Primary: Student in an Organized Health Care Education/Training Program

## 2018-10-05 DIAGNOSIS — K121 Other forms of stomatitis: Secondary | ICD-10-CM

## 2018-10-05 DIAGNOSIS — I10 Essential (primary) hypertension: Secondary | ICD-10-CM

## 2018-10-05 DIAGNOSIS — E876 Hypokalemia: Secondary | ICD-10-CM

## 2018-10-05 DIAGNOSIS — Z09 Encounter for follow-up examination after completed treatment for conditions other than malignant neoplasm: Principal | ICD-10-CM

## 2018-10-05 DIAGNOSIS — C61 Malignant neoplasm of prostate: Secondary | ICD-10-CM

## 2018-10-05 DIAGNOSIS — D649 Anemia, unspecified: Secondary | ICD-10-CM

## 2018-10-05 DIAGNOSIS — J449 Chronic obstructive pulmonary disease, unspecified: Secondary | ICD-10-CM

## 2018-10-05 DIAGNOSIS — R1310 Dysphagia, unspecified: Secondary | ICD-10-CM

## 2018-10-05 DIAGNOSIS — K1230 Oral mucositis (ulcerative), unspecified: Secondary | ICD-10-CM

## 2018-10-05 MED ORDER — MAGIC MOUTH WASH SUSP
Freq: Four times a day (QID) | ORAL | 0 refills | 0 days | Status: CP | PRN
Start: 2018-10-05 — End: 2018-10-23

## 2018-10-05 MED ORDER — LIDOCAINE HCL 2 % MUCOSAL SOLUTION
OROMUCOSAL | 1 refills | 0 days | Status: CP | PRN
Start: 2018-10-05 — End: 2018-11-23

## 2018-10-05 MED ORDER — TIOTROPIUM BROMIDE 18 MCG CAPSULE WITH INHALATION DEVICE
ORAL_CAPSULE | Freq: Every day | RESPIRATORY_TRACT | 3 refills | 0.00000 days | Status: CP
Start: 2018-10-05 — End: 2018-11-30

## 2018-10-23 ENCOUNTER — Ambulatory Visit
Admit: 2018-10-23 | Discharge: 2018-10-24 | Payer: MEDICAID | Attending: Hematology & Oncology | Primary: Hematology & Oncology

## 2018-10-23 DIAGNOSIS — C61 Malignant neoplasm of prostate: Principal | ICD-10-CM

## 2018-10-23 MED ORDER — OXYCODONE 5 MG TABLET
ORAL_TABLET | Freq: Four times a day (QID) | ORAL | 0 refills | 0.00000 days | Status: SS | PRN
Start: 2018-10-23 — End: 2018-11-16

## 2018-11-06 ENCOUNTER — Ambulatory Visit: Admit: 2018-11-06 | Discharge: 2018-11-06 | Payer: MEDICAID

## 2018-11-06 DIAGNOSIS — C61 Malignant neoplasm of prostate: Principal | ICD-10-CM

## 2018-11-11 ENCOUNTER — Ambulatory Visit: Admit: 2018-11-11 | Discharge: 2018-11-16 | Disposition: A | Discharge status: Home / Self Care | Payer: MEDICAID

## 2018-11-11 DIAGNOSIS — R05 Cough: Principal | ICD-10-CM

## 2018-11-16 MED ORDER — TRAZODONE 50 MG TABLET
ORAL_TABLET | Freq: Every evening | ORAL | 3 refills | 0 days | Status: CP | PRN
Start: 2018-11-16 — End: 2019-01-19

## 2018-11-16 MED ORDER — OXYCODONE 5 MG TABLET
ORAL_TABLET | Freq: Four times a day (QID) | ORAL | 0 refills | 0 days | Status: CP | PRN
Start: 2018-11-16 — End: 2018-11-27

## 2018-11-16 MED ORDER — POTASSIUM CHLORIDE ER 20 MEQ TABLET,EXTENDED RELEASE(PART/CRYST)
ORAL_TABLET | Freq: Two times a day (BID) | ORAL | 3 refills | 0 days | Status: CP
Start: 2018-11-16 — End: 2019-03-11

## 2018-11-17 MED ORDER — VALACYCLOVIR 500 MG TABLET
ORAL_TABLET | Freq: Every day | ORAL | 0 refills | 0.00000 days | Status: CP
Start: 2018-11-17 — End: 2019-01-19

## 2018-11-23 ENCOUNTER — Ambulatory Visit: Admit: 2018-11-23 | Discharge: 2018-11-23 | Payer: MEDICAID

## 2018-11-23 DIAGNOSIS — K121 Other forms of stomatitis: Secondary | ICD-10-CM

## 2018-11-23 DIAGNOSIS — D649 Anemia, unspecified: Secondary | ICD-10-CM

## 2018-11-23 DIAGNOSIS — E871 Hypo-osmolality and hyponatremia: Secondary | ICD-10-CM

## 2018-11-23 DIAGNOSIS — K1230 Oral mucositis (ulcerative), unspecified: Secondary | ICD-10-CM

## 2018-11-23 DIAGNOSIS — Z09 Encounter for follow-up examination after completed treatment for conditions other than malignant neoplasm: Principal | ICD-10-CM

## 2018-11-23 DIAGNOSIS — C61 Malignant neoplasm of prostate: Principal | ICD-10-CM

## 2018-11-23 MED ORDER — LIDOCAINE HCL 2 % MUCOSAL SOLUTION
OROMUCOSAL | 1 refills | 0 days | Status: CP | PRN
Start: 2018-11-23 — End: 2018-11-24

## 2018-11-23 MED ORDER — MAGIC MOUTHWASH ORAL SUSPENSION
Freq: Four times a day (QID) | ORAL | 1 refills | 0 days | Status: CP | PRN
Start: 2018-11-23 — End: ?

## 2018-11-24 MED ORDER — LIDOCAINE-DIPHENHYD-AL-MAG-SIM 200 MG-25 MG-400 MG-40MG/30ML MOUTHWASH
Freq: Four times a day (QID) | OROMUCOSAL | 0 refills | 0 days | Status: CP | PRN
Start: 2018-11-24 — End: 2019-02-02

## 2018-11-27 MED ORDER — OXYCODONE 5 MG TABLET
ORAL_TABLET | Freq: Four times a day (QID) | ORAL | 0 refills | 0 days | Status: CP | PRN
Start: 2018-11-27 — End: 2018-12-01

## 2018-11-30 MED ORDER — TIOTROPIUM BROMIDE 18 MCG CAPSULE WITH INHALATION DEVICE
ORAL_CAPSULE | Freq: Every day | RESPIRATORY_TRACT | 3 refills | 0 days | Status: CP
Start: 2018-11-30 — End: 2019-01-19

## 2018-12-01 ENCOUNTER — Ambulatory Visit: Admit: 2018-12-01 | Discharge: 2018-12-01 | Payer: MEDICAID

## 2018-12-01 ENCOUNTER — Ambulatory Visit
Admit: 2018-12-01 | Discharge: 2018-12-01 | Payer: MEDICAID | Attending: Hematology & Oncology | Primary: Hematology & Oncology

## 2018-12-01 DIAGNOSIS — C61 Malignant neoplasm of prostate: Principal | ICD-10-CM

## 2018-12-01 DIAGNOSIS — R152 Fecal urgency: Secondary | ICD-10-CM

## 2018-12-01 DIAGNOSIS — R159 Full incontinence of feces: Secondary | ICD-10-CM

## 2018-12-03 ENCOUNTER — Institutional Professional Consult (permissible substitution): Admit: 2018-12-03 | Discharge: 2018-12-04 | Payer: MEDICAID

## 2018-12-11 MED ORDER — OXYCODONE 5 MG TABLET
ORAL_TABLET | Freq: Four times a day (QID) | ORAL | 0 refills | 0 days | Status: CP | PRN
Start: 2018-12-11 — End: 2019-01-27

## 2019-01-07 ENCOUNTER — Encounter: Payer: Self-pay | Admitting: Radiology

## 2019-01-07 ENCOUNTER — Other Ambulatory Visit: Payer: Self-pay

## 2019-01-07 ENCOUNTER — Emergency Department: Payer: Medicaid Other

## 2019-01-07 ENCOUNTER — Inpatient Hospital Stay
Admission: EM | Admit: 2019-01-07 | Discharge: 2019-01-08 | DRG: 871 | Disposition: A | Discharge status: Home / Self Care | Payer: Medicaid Other | Attending: Specialist | Admitting: Specialist

## 2019-01-07 DIAGNOSIS — Z7951 Long term (current) use of inhaled steroids: Secondary | ICD-10-CM

## 2019-01-07 DIAGNOSIS — A419 Sepsis, unspecified organism: Secondary | ICD-10-CM | POA: Diagnosis not present

## 2019-01-07 DIAGNOSIS — R451 Restlessness and agitation: Secondary | ICD-10-CM | POA: Diagnosis present

## 2019-01-07 DIAGNOSIS — F17210 Nicotine dependence, cigarettes, uncomplicated: Secondary | ICD-10-CM | POA: Diagnosis present

## 2019-01-07 DIAGNOSIS — Z6825 Body mass index (BMI) 25.0-25.9, adult: Secondary | ICD-10-CM

## 2019-01-07 DIAGNOSIS — C61 Malignant neoplasm of prostate: Secondary | ICD-10-CM | POA: Diagnosis present

## 2019-01-07 DIAGNOSIS — Z79899 Other long term (current) drug therapy: Secondary | ICD-10-CM

## 2019-01-07 DIAGNOSIS — J449 Chronic obstructive pulmonary disease, unspecified: Secondary | ICD-10-CM | POA: Diagnosis present

## 2019-01-07 DIAGNOSIS — R0602 Shortness of breath: Secondary | ICD-10-CM

## 2019-01-07 DIAGNOSIS — G4733 Obstructive sleep apnea (adult) (pediatric): Secondary | ICD-10-CM | POA: Diagnosis present

## 2019-01-07 DIAGNOSIS — Z880 Allergy status to penicillin: Secondary | ICD-10-CM | POA: Diagnosis not present

## 2019-01-07 DIAGNOSIS — I10 Essential (primary) hypertension: Secondary | ICD-10-CM | POA: Diagnosis present

## 2019-01-07 DIAGNOSIS — K219 Gastro-esophageal reflux disease without esophagitis: Secondary | ICD-10-CM | POA: Diagnosis present

## 2019-01-07 DIAGNOSIS — Z8546 Personal history of malignant neoplasm of prostate: Secondary | ICD-10-CM

## 2019-01-07 DIAGNOSIS — C7951 Secondary malignant neoplasm of bone: Secondary | ICD-10-CM | POA: Diagnosis present

## 2019-01-07 DIAGNOSIS — J690 Pneumonitis due to inhalation of food and vomit: Secondary | ICD-10-CM | POA: Diagnosis present

## 2019-01-07 DIAGNOSIS — E440 Moderate protein-calorie malnutrition: Secondary | ICD-10-CM | POA: Diagnosis present

## 2019-01-07 DIAGNOSIS — R4182 Altered mental status, unspecified: Secondary | ICD-10-CM

## 2019-01-07 HISTORY — DX: Chronic obstructive pulmonary disease, unspecified: J44.9

## 2019-01-07 LAB — COMPREHENSIVE METABOLIC PANEL
ALK PHOS: 89 U/L (ref 38–126)
ALT: 23 U/L (ref 0–44)
ANION GAP: 10 (ref 5–15)
AST: 119 U/L — ABNORMAL HIGH (ref 15–41)
Albumin: 3.2 g/dL — ABNORMAL LOW (ref 3.5–5.0)
BUN: 5 mg/dL — ABNORMAL LOW (ref 6–20)
CALCIUM: 8.7 mg/dL — AB (ref 8.9–10.3)
CO2: 20 mmol/L — ABNORMAL LOW (ref 22–32)
Chloride: 110 mmol/L (ref 98–111)
Creatinine, Ser: 0.96 mg/dL (ref 0.61–1.24)
GLUCOSE: 104 mg/dL — AB (ref 70–99)
Potassium: 3.7 mmol/L (ref 3.5–5.1)
Sodium: 140 mmol/L (ref 135–145)
TOTAL PROTEIN: 6.4 g/dL — AB (ref 6.5–8.1)
Total Bilirubin: 0.9 mg/dL (ref 0.3–1.2)

## 2019-01-07 LAB — CBC WITH DIFFERENTIAL/PLATELET
Abs Immature Granulocytes: 0.02 10*3/uL (ref 0.00–0.07)
Basophils Absolute: 0.1 10*3/uL (ref 0.0–0.1)
Basophils Relative: 1 %
EOS PCT: 1 %
Eosinophils Absolute: 0.1 10*3/uL (ref 0.0–0.5)
HEMATOCRIT: 29.5 % — AB (ref 39.0–52.0)
HEMOGLOBIN: 10.5 g/dL — AB (ref 13.0–17.0)
Immature Granulocytes: 0 %
LYMPHS ABS: 1.9 10*3/uL (ref 0.7–4.0)
LYMPHS PCT: 31 %
MCH: 35.2 pg — AB (ref 26.0–34.0)
MCHC: 35.6 g/dL (ref 30.0–36.0)
MCV: 99 fL (ref 80.0–100.0)
MONO ABS: 0.7 10*3/uL (ref 0.1–1.0)
Monocytes Relative: 11 %
Neutro Abs: 3.5 10*3/uL (ref 1.7–7.7)
Neutrophils Relative %: 56 %
Platelets: 196 10*3/uL (ref 150–400)
RBC: 2.98 MIL/uL — ABNORMAL LOW (ref 4.22–5.81)
RDW: 13.1 % (ref 11.5–15.5)
WBC: 6.2 10*3/uL (ref 4.0–10.5)
nRBC: 0 % (ref 0.0–0.2)

## 2019-01-07 LAB — URINALYSIS, COMPLETE (UACMP) WITH MICROSCOPIC
BACTERIA UA: NONE SEEN
BILIRUBIN URINE: NEGATIVE
GLUCOSE, UA: NEGATIVE mg/dL
KETONES UR: NEGATIVE mg/dL
Leukocytes, UA: NEGATIVE
NITRITE: NEGATIVE
PH: 8 (ref 5.0–8.0)
Protein, ur: NEGATIVE mg/dL
SPECIFIC GRAVITY, URINE: 1.009 (ref 1.005–1.030)
Squamous Epithelial / LPF: NONE SEEN (ref 0–5)
WBC, UA: NONE SEEN WBC/hpf (ref 0–5)

## 2019-01-07 LAB — INFLUENZA PANEL BY PCR (TYPE A & B)
INFLBPCR: NEGATIVE
Influenza A By PCR: NEGATIVE

## 2019-01-07 LAB — CG4 I-STAT (LACTIC ACID): Lactic Acid, Venous: 3.57 mmol/L (ref 0.5–1.9)

## 2019-01-07 LAB — LACTIC ACID, PLASMA
Lactic Acid, Venous: 3.3 mmol/L (ref 0.5–1.9)
Lactic Acid, Venous: 1.8 mmol/L (ref 0.5–1.9)

## 2019-01-07 LAB — PROCALCITONIN

## 2019-01-07 LAB — LIPASE, BLOOD: Lipase: 29 U/L (ref 11–51)

## 2019-01-07 LAB — TROPONIN I: Troponin I: <0.03 ng/mL (ref <0.03)

## 2019-01-07 MED ORDER — TRAZODONE HCL 50 MG PO TABS
50.0000 mg | ORAL_TABLET | Freq: Every evening | ORAL | Status: DC | PRN
  Administered 2019-01-07: 22:00:00 50 mg via ORAL
  Filled 2019-01-07: qty 1

## 2019-01-07 MED ORDER — IOHEXOL 350 MG/ML SOLN
75.0000 mL | Freq: Once | INTRAVENOUS | Status: AC | PRN
  Administered 2019-01-07: 75 mL via INTRAVENOUS

## 2019-01-07 MED ORDER — MORPHINE SULFATE (PF) 4 MG/ML IV SOLN
4.0000 mg | Freq: Once | INTRAVENOUS | Status: AC
  Administered 2019-01-07: 4 mg via INTRAVENOUS
  Filled 2019-01-07: qty 1

## 2019-01-07 MED ORDER — SODIUM CHLORIDE 0.9 % IV SOLN
2.0000 g | Freq: Once | INTRAVENOUS | Status: AC
  Administered 2019-01-07: 2 g via INTRAVENOUS
  Filled 2019-01-07: qty 2

## 2019-01-07 MED ORDER — MAGIC MOUTHWASH: 5.0000 mL | Freq: Four times a day (QID) | ORAL | Status: DC | PRN

## 2019-01-07 MED ORDER — MORPHINE SULFATE (PF) 2 MG/ML IV SOLN
2.0000 mg | Freq: Once | INTRAVENOUS | Status: AC
  Administered 2019-01-07: 2 mg via INTRAVENOUS
  Filled 2019-01-07: qty 1

## 2019-01-07 MED ORDER — ENOXAPARIN SODIUM 40 MG/0.4ML ~~LOC~~ SOLN
40.0000 mg | SUBCUTANEOUS | Status: DC
  Administered 2019-01-07: 22:00:00 40 mg via SUBCUTANEOUS
  Filled 2019-01-07: qty 0.4

## 2019-01-07 MED ORDER — VANCOMYCIN HCL IN DEXTROSE 1-5 GM/200ML-% IV SOLN
1000.0000 mg | Freq: Two times a day (BID) | INTRAVENOUS | Status: DC
  Administered 2019-01-07: 20:00:00 1000 mg via INTRAVENOUS
  Filled 2019-01-07 (×3): qty 200

## 2019-01-07 MED ORDER — BUSPIRONE HCL 5 MG PO TABS
5.0000 mg | ORAL_TABLET | Freq: Three times a day (TID) | ORAL | Status: DC
  Administered 2019-01-07 – 2019-01-08 (×3): 5 mg via ORAL
  Filled 2019-01-07 (×6): qty 1

## 2019-01-07 MED ORDER — DEXAMETHASONE 4 MG PO TABS
8.0000 mg | ORAL_TABLET | Freq: Two times a day (BID) | ORAL | Status: DC
  Administered 2019-01-07 – 2019-01-08 (×2): 8 mg via ORAL
  Filled 2019-01-07 (×2): qty 2

## 2019-01-07 MED ORDER — SODIUM CHLORIDE 0.9 % IV SOLN
2.0000 g | Freq: Three times a day (TID) | INTRAVENOUS | Status: DC
  Administered 2019-01-07 – 2019-01-08 (×3): 2 g via INTRAVENOUS
  Filled 2019-01-07 (×7): qty 2

## 2019-01-07 MED ORDER — METRONIDAZOLE IN NACL 5-0.79 MG/ML-% IV SOLN
500.0000 mg | Freq: Three times a day (TID) | INTRAVENOUS | Status: DC
  Administered 2019-01-07 – 2019-01-08 (×4): 500 mg via INTRAVENOUS
  Filled 2019-01-07 (×6): qty 100

## 2019-01-07 MED ORDER — RENA-VITE PO TABS
1.0000 | ORAL_TABLET | Freq: Every day | ORAL | Status: DC
  Administered 2019-01-07 – 2019-01-08 (×2): 1 via ORAL
  Filled 2019-01-07 (×3): qty 1

## 2019-01-07 MED ORDER — SODIUM CHLORIDE 0.9 % IV SOLN
INTRAVENOUS | Status: DC
  Administered 2019-01-07 – 2019-01-08 (×2): via INTRAVENOUS

## 2019-01-07 MED ORDER — LORAZEPAM 2 MG/ML IJ SOLN
0.5000 mg | Freq: Once | INTRAMUSCULAR | Status: AC
  Administered 2019-01-07: 0.5 mg via INTRAVENOUS
  Filled 2019-01-07: qty 1

## 2019-01-07 MED ORDER — PANTOPRAZOLE SODIUM 40 MG PO TBEC
40.0000 mg | DELAYED_RELEASE_TABLET | Freq: Every day | ORAL | Status: DC
  Administered 2019-01-07 – 2019-01-08 (×2): 40 mg via ORAL
  Filled 2019-01-07 (×2): qty 1

## 2019-01-07 MED ORDER — TRIAMTERENE-HCTZ 37.5-25 MG PO TABS
1.0000 | ORAL_TABLET | Freq: Every day | ORAL | Status: DC
  Administered 2019-01-07 – 2019-01-08 (×2): 1 via ORAL
  Filled 2019-01-07 (×3): qty 1

## 2019-01-07 MED ORDER — VANCOMYCIN HCL 10 G IV SOLR
1750.0000 mg | Freq: Once | INTRAVENOUS | Status: AC
  Administered 2019-01-07: 1750 mg via INTRAVENOUS
  Filled 2019-01-07: qty 1750

## 2019-01-07 MED ORDER — TIOTROPIUM BROMIDE MONOHYDRATE 18 MCG IN CAPS
1.0000 | ORAL_CAPSULE | Freq: Every day | RESPIRATORY_TRACT | Status: DC
  Administered 2019-01-08: 18 ug via RESPIRATORY_TRACT
  Filled 2019-01-07 (×2): qty 5

## 2019-01-07 MED ORDER — VANCOMYCIN HCL 10 G IV SOLR: 1500.0000 mg | Freq: Two times a day (BID) | INTRAVENOUS | Status: DC

## 2019-01-07 MED ORDER — LABETALOL HCL 5 MG/ML IV SOLN
10.0000 mg | Freq: Four times a day (QID) | INTRAVENOUS | Status: DC | PRN
  Administered 2019-01-07: 10 mg via INTRAVENOUS
  Filled 2019-01-07 (×2): qty 4

## 2019-01-07 MED ORDER — ONDANSETRON HCL 4 MG/2ML IJ SOLN
4.0000 mg | Freq: Once | INTRAMUSCULAR | Status: AC
  Administered 2019-01-07: 4 mg via INTRAVENOUS

## 2019-01-07 MED ORDER — LIDOCAINE VISCOUS HCL 2 % MT SOLN
15.0000 mL | OROMUCOSAL | Status: DC | PRN
  Filled 2019-01-07: qty 15

## 2019-01-07 MED ORDER — ONDANSETRON HCL 4 MG/2ML IJ SOLN
4.0000 mg | Freq: Four times a day (QID) | INTRAMUSCULAR | Status: DC | PRN
  Administered 2019-01-07: 4 mg via INTRAVENOUS
  Filled 2019-01-07: qty 2

## 2019-01-07 MED ORDER — OXYCODONE HCL 5 MG PO TABS
5.0000 mg | ORAL_TABLET | Freq: Four times a day (QID) | ORAL | Status: DC | PRN
  Administered 2019-01-08: 5 mg via ORAL
  Filled 2019-01-07: qty 1

## 2019-01-07 MED ORDER — ONDANSETRON HCL 4 MG PO TABS: 4.0000 mg | ORAL_TABLET | Freq: Four times a day (QID) | ORAL | Status: DC | PRN

## 2019-01-07 MED ORDER — ONDANSETRON HCL 4 MG/2ML IJ SOLN
4.0000 mg | Freq: Once | INTRAMUSCULAR | Status: AC
  Administered 2019-01-07: 4 mg via INTRAVENOUS
  Filled 2019-01-07: qty 2

## 2019-01-07 MED ORDER — MAGNESIUM OXIDE 400 (241.3 MG) MG PO TABS
400.0000 mg | ORAL_TABLET | Freq: Two times a day (BID) | ORAL | Status: DC
  Administered 2019-01-07 – 2019-01-08 (×2): 400 mg via ORAL
  Filled 2019-01-07 (×2): qty 1

## 2019-01-07 MED ORDER — POTASSIUM CHLORIDE CRYS ER 20 MEQ PO TBCR
60.0000 meq | EXTENDED_RELEASE_TABLET | Freq: Two times a day (BID) | ORAL | Status: DC
  Administered 2019-01-07 – 2019-01-08 (×2): 60 meq via ORAL
  Filled 2019-01-07: qty 3
  Filled 2019-01-07: qty 6

## 2019-01-07 MED ORDER — PANCRELIPASE (LIP-PROT-AMYL) 12000-38000 UNITS PO CPEP
12000.0000 [IU] | ORAL_CAPSULE | Freq: Three times a day (TID) | ORAL | Status: DC
  Administered 2019-01-07 – 2019-01-08 (×3): 12000 [IU] via ORAL
  Filled 2019-01-07 (×3): qty 1

## 2019-01-07 MED ORDER — ONDANSETRON HCL 4 MG/2ML IJ SOLN
INTRAMUSCULAR | Status: AC
  Filled 2019-01-07: qty 2

## 2019-01-07 MED ORDER — SODIUM CHLORIDE 0.9 % IV SOLN
1000.0000 mL | Freq: Once | INTRAVENOUS | Status: AC
  Administered 2019-01-07: 1000 mL via INTRAVENOUS

## 2019-01-07 MED ORDER — NAPROXEN 250 MG PO TABS
250.0000 mg | ORAL_TABLET | Freq: Two times a day (BID) | ORAL | Status: DC | PRN
  Filled 2019-01-07: qty 1

## 2019-01-07 MED ORDER — VALACYCLOVIR HCL 500 MG PO TABS
500.0000 mg | ORAL_TABLET | Freq: Every day | ORAL | Status: DC
  Administered 2019-01-07 – 2019-01-08 (×2): 500 mg via ORAL
  Filled 2019-01-07 (×3): qty 1

## 2019-01-07 MED ORDER — VANCOMYCIN HCL IN DEXTROSE 1-5 GM/200ML-% IV SOLN
1000.0000 mg | Freq: Once | INTRAVENOUS | Status: DC
  Filled 2019-01-07: qty 200

## 2019-01-07 NOTE — Progress Notes (Signed)
Family Meeting Note  Advance Directive:yes  Today a meeting took place with the Patient.     The following clinical team members were present during this meeting:MD  The following were discussed:Patient's diagnosis: Sepsis probably aspiration pneumonia, metastatic prostate cancer, obstructive sleep apnea, hypertension, nicotine abuse, COPD, treatment plan of care discussed in detail with the patient.  He verbalized understanding of the plan.    Patient's progosis: Unable to determine and Goals for treatment: Full Code  Brother is healthcare POA  Additional follow-up to be provided: Hospitalist  Time spent during discussion:17 MIN  Nicholes Mango, MD

## 2019-01-07 NOTE — ED Notes (Addendum)
Pt c/o foot pain. Reports take oxycodone 5mg  4x a day. MD notified and will give morphine.

## 2019-01-07 NOTE — ED Notes (Signed)
Waiting on vanc from pharmacy

## 2019-01-07 NOTE — ED Triage Notes (Signed)
Patient arrived by [facility]. With difficulty breathing and altered mental status. Patient was found with labored breathing. Patient has been sick in bed and has not eat in 9 days per brother. Patient has history of Lung Cancer.

## 2019-01-07 NOTE — Progress Notes (Signed)
CODE SEPSIS - PHARMACY COMMUNICATION  **Broad Spectrum Antibiotics should be administered within 1 hour of Sepsis diagnosis**  Time Code Sepsis Called/Page Received: @ 0724  Antibiotics Ordered: Cefepime, Vancomycin and Flagyl   Time of 1st antibiotic administration: @ [phone]  Additional action taken by pharmacy: Contacted nurse @ [phone] about time left to to get Abx started within 1 hour of Code Sepsis page   If necessary, Name of Provider/Nurse Contacted: Nira Conn, RN  Pernell Dupre, PharmD, BCPS Clinical Pharmacist 01/07/2019 8:08 AM

## 2019-01-07 NOTE — Consult Note (Addendum)
Pharmacy Antibiotic Note  Alec Hart. is a 58 y.o. male admitted on 01/07/2019 with aspiration pneumonia.  Pharmacy has been consulted for cefepime and vancomycin dosing.  Plan: Cefepime 2 gm IV every 8 hours  Loading dose: Vancomycin 1750 mg IV once Vancomycin 1000 mg IV Q 12 hrs. Goal AUC 400-550. Expected AUC: 523.6 SCr used: 0.96  Height: 5\' 8"  (172.7 cm) Weight: 165 lb (74.8 kg) IBW/kg (Calculated) : 68.4  Temp (24hrs), Avg:98.2 F (36.8 C), Min:98.2 F (36.8 C), Max:98.2 F (36.8 C)  Recent Labs  Lab 01/07/19 0654 01/07/19 0708 01/07/19 1125  WBC 6.2  --   --   CREATININE 0.96  --   --   LATICACIDVEN  --  3.57* 3.3*    Estimated Creatinine Clearance: 80.2 mL/min (by C-G formula based on SCr of 0.96 mg/dL).    Allergies  Allergen Reactions  . Penicillins     Antimicrobials this admission: Cefepime 1/16 >>  Vanco 1/16 >>  Metronidazole 1/16 >>  Dose adjustments this admission:   Microbiology results: 1/16 BCx: pending 1/16 UCx: pending   1/16 MRSA PCR: pending  Thank you for allowing pharmacy to be a part of this patient's care.  Forrest Moron, PharmD Clinical Pharmacist 01/07/2019 12:24 PM

## 2019-01-07 NOTE — ED Notes (Signed)
Patient with consistent high blood pressure readings. Dr. Margaretmary Eddy made aware.

## 2019-01-07 NOTE — ED Notes (Signed)
Pt vomiting, zofran order obtained

## 2019-01-07 NOTE — H&P (Signed)
[facility] [place] NAME: Alec Hart    MR#:  [id number]  DATE OF BIRTH:  Feb 27, 1961  DATE OF ADMISSION:  01/07/2019  PRIMARY CARE PHYSICIAN: System, Provider Not In   REQUESTING/REFERRING PHYSICIAN: Dr. Corky Downs  CHIEF COMPLAINT:  Shortness of breath and altered mental status  HISTORY OF PRESENT ILLNESS:  Alec Hart  is a 58 y.o. 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  Patient was initially very agitated and has received Ativan.  Lactic acid is elevated and patient was tachycardic.  Cultures were obtained and patient was given broad-spectrum IV antibiotics for sepsis and hospitalist team is called to admit the patient.  During my examination patient is arousable and answering questions appropriately but he has some lethargy still no family members at bedside  PAST MEDICAL HISTORY:   Past Medical History:  Diagnosis Date  . Cancer [facility])    prostate ca  . Hypertension   . Pancreatitis     PAST SURGICAL HISTOIRY:   Past Surgical History:  Procedure Laterality Date  . PROSTATE SURGERY      SOCIAL HISTORY:   Social History   Tobacco Use  . Smoking status: Current Some Day Smoker    Packs/day: 0.25  . Smokeless tobacco: Never Used  Substance Use Topics  . Alcohol use: Yes    Comment: occasional    FAMILY HISTORY:  No family history on file.  DRUG ALLERGIES:   Allergies  Allergen Reactions  . Penicillins     REVIEW OF SYSTEMS:  Review of system limited   CONSTITUTIONAL: No fever, reporting generalized weakness.  EYES: No blurred or double vision.  EARS, NOSE, AND THROAT: No tinnitus or ear pain.  RESPIRATORY: Reporting productive cough and shortness of breath but denies any wheezing  or hemoptysis  CARDIOVASCULAR: No chest pain, orthopnea, edema.  GASTROINTESTINAL: No nausea, vomiting, diarrhea or abdominal pain.  MUSCULOSKELETAL: No joint pain or arthritis.   NEUROLOGIC: No tingling, numbness, weakness.  PSYCHIATRY: No anxiety or depression.   MEDICATIONS AT HOME:   Prior to Admission medications   Medication Sig Start Date End Date Taking? Authorizing Provider  albuterol (PROVENTIL HFA;VENTOLIN HFA) 108 (90 Base) MCG/ACT inhaler Inhale 2 puffs into the lungs every 6 (six) hours as needed for wheezing. 09/26/18  Yes [provider]  b complex vitamins capsule Take 1 capsule by mouth daily.   Yes [provider]  busPIRone (BUSPAR) 5 MG tablet Take 5 mg by mouth 3 (three) times daily. 03/26/18  Yes [provider]  lidocaine (XYLOCAINE) 2 % solution Use as directed 15 mLs in the mouth or throat every 4 (four) hours as needed for mouth pain.   Yes [provider]  magic mouthwash SOLN Take 10 mLs by mouth 4 (four) times daily as needed. 11/23/18  Yes [provider]  magnesium oxide (MAG-OX) 400 MG tablet Take 400 mg by mouth 2 (two) times daily. 09/09/18  Yes [provider]  naproxen sodium (ALEVE) 220 MG tablet Take 220 mg by mouth 2 (two) times daily as needed (pain).   Yes [provider]  omeprazole (PRILOSEC) 20 MG capsule Take 40 mg by mouth daily. 05/19/18  Yes [provider]  oxyCODONE (OXY IR/ROXICODONE) 5 MG immediate release tablet Take 5  mg by mouth every 6 (six) hours as needed for pain. 12/11/18  Yes [provider]  Pancrelipase, Lip-Prot-Amyl, (CREON) 24000-76000 units CPEP Take 24,000-76,000 Units by mouth 3 (three) times daily. Take 48,000 units of lipase by mouth three times a day with a meal. Take an additional one capsule with snacks.   Yes [provider]  potassium chloride SA (K-DUR,KLOR-CON) 20 MEQ tablet Take 60 mEq by mouth 2 (two) times daily. 11/16/18  Yes  [provider]  tiotropium (SPIRIVA) 18 MCG inhalation capsule Place 1 capsule into inhaler and inhale daily. 11/30/18  Yes [provider]  traZODone (DESYREL) 50 MG tablet Take 50 mg by mouth at bedtime as needed for sleep. 11/16/18  Yes [provider]  triamterene-hydrochlorothiazide (MAXZIDE-25) 37.5-25 MG tablet Take 1 tablet by mouth daily.   Yes [provider]  valACYclovir (VALTREX) 500 MG tablet Take 500 mg by mouth daily. 11/17/18  Yes [provider]  dexamethasone (DECADRON) 4 MG tablet Take 8 mg by mouth 2 (two) times daily. 07/15/18   [provider]  oxyCODONE (OXY IR/ROXICODONE) 5 MG immediate release tablet Take 1 tablet (5 mg total) by mouth every 6 (six) hours as needed for severe pain. Patient not taking: Reported on 01/07/2019 12/14/17   Delman Kitten, MD      VITAL SIGNS:  Blood pressure (!) 168/88, pulse 100, temperature 98.2 F (36.8 C), temperature source Oral, resp. rate (!) 23, height '5\' 8"'$  (1.727 m), weight 74.8 kg, SpO2 100 %.  PHYSICAL EXAMINATION:  GENERAL:  59 y.o.-year-old patient lying in the bed with no acute distress.  EYES: Pupils equal, round, reactive to light and accommodation. No scleral icterus. Extraocular muscles intact.  HEENT: Head atraumatic, normocephalic. Oropharynx and nasopharynx clear.  NECK:  Supple, no jugular venous distention. No thyroid enlargement, no tenderness.  LUNGS: Normal breath sounds bilaterally, no wheezing, rales,rhonchi or crepitation. No use of accessory muscles of respiration.  CARDIOVASCULAR: S1, S2 normal. No murmurs, rubs, or gallops.  ABDOMEN: Soft, nontender, nondistended. Bowel sounds present. No organomegaly or mass.  EXTREMITIES: No pedal edema, cyanosis, or clubbing.  NEUROLOGIC: Arousable and answering questions appropriately but falling asleep sensation intact. Gait not checked.  PSYCHIATRIC: The patient is alert and oriented x 2-3.  SKIN: No obvious rash,  lesion, or ulcer.   LABORATORY PANEL:   CBC Recent Labs  Lab 01/07/19 0654  WBC 6.2  HGB 10.5*  HCT 29.5*  PLT 196   ------------------------------------------------------------------------------------------------------------------  Chemistries  Recent Labs  Lab 01/07/19 0654  NA 140  K 3.7  CL 110  CO2 20*  GLUCOSE 104*  BUN 5*  CREATININE 0.96  CALCIUM 8.7*  AST 119*  ALT 23  ALKPHOS 89  BILITOT 0.9   ------------------------------------------------------------------------------------------------------------------  Cardiac Enzymes Recent Labs  Lab 01/07/19 0654  TROPONINI <0.03   ------------------------------------------------------------------------------------------------------------------  RADIOLOGY:  Ct Head Wo Contrast  Result Date: 01/07/2019 CLINICAL DATA:  Unexplained altered level of consciousness EXAM: CT HEAD WITHOUT CONTRAST TECHNIQUE: Contiguous axial images were obtained from the base of the skull through the vertex without intravenous contrast. COMPARISON:  12/14/2017 FINDINGS: Brain: No evidence of acute infarction, hemorrhage, hydrocephalus, extra-axial collection or mass lesion/mass effect. Cerebral volume loss that is generalized. Vascular: No hyperdense vessel or unexpected calcification. Skull: No acute finding. Sclerotic foci in the inferior right occipital bone and high anterior right parietal bone are stable from prior in this patient with history of metastatic prostate cancer. Sinuses/Orbits: Left more than right ethmoid and left  maxillary sinus mucosal thickening IMPRESSION: 1. No acute intracranial finding or change from 2018. 2. Sinusitis. Electronically Signed   By: Monte Fantasia M.D.   On: 01/07/2019 07:40   Ct Angio Chest Pe W And/or Wo Contrast  Result Date: 01/07/2019 CLINICAL DATA:  Altered mental status. Difficulty breathing. History of prostate cancer. Evaluate pulmonary embolism. EXAM: CT ANGIOGRAPHY CHEST WITH CONTRAST  TECHNIQUE: Multidetector CT imaging of the chest was performed using the standard protocol during bolus administration of intravenous contrast. Multiplanar CT image reconstructions and MIPs were obtained to evaluate the vascular anatomy. CONTRAST:  33m OMNIPAQUE IOHEXOL 350 MG/ML SOLN COMPARISON:  Chest CT - 12/14/2017; 04/20/2015 FINDINGS: Vascular Findings: There is adequate opacification of the pulmonary arterial system with the main pulmonary artery measuring 466 Hounsfield units. There are no discrete filling defects within the pulmonary arterial tree to suggest pulmonary embolism. Normal caliber of the main pulmonary artery. Normal heart size. Coronary artery calcifications. No pericardial effusion. No evidence of thoracic aortic aneurysm or dissection on this nongated examination. Atherosclerotic plaque within the aortic arch and descending thoracic aorta, not resulting in hemodynamically significant stenosis. The left vertebral artery is incidentally noted to arise directly from the aortic arch. The branch vessels of the aortic arch appear widely patent throughout their imaged courses. Right jugular approach port a catheter tip terminates within the superior cavoatrial junction. Review of the MIP images confirms the above findings. ---------------------------------------------------------------------------------- Nonvascular Findings: Mediastinum/Lymph Nodes: No bulky mediastinal, hilar or axillary lymphadenopathy. Lungs/Pleura: Minimal apical predominant paraseptal emphysematous changes, most conspicuous within right lung apex, unchanged. Occlusion of the medial basilar segmental bronchus of the left lower lobe (image 62, series 6). The remaining central pulmonary airways appear widely patent. Progressive subpleural reticular opacities about the nondependent portion of the bilateral upper lobes (representative image 39, series 6), without associated honeycombing. Nodular thickening along the right major  fissure (image 49, series 6), is unchanged and favored to represent a perifissural lymph node. No pleural effusion or pneumothorax. Upper abdomen: Limited early arterial phase evaluation of the upper abdomen demonstrates diffuse decreased attenuation hepatic parenchyma suggestive of hepatic steatosis. Note is made of a punctate granuloma within the right lobe of the liver (image 76, series 4). There is an approximately 3.0 cm hypoattenuating lesion within the dome of the left kidney, incompletely characterize of favored to represent a renal cyst. Musculoskeletal: Progressive osseous metastatic disease with index sclerotic lesion within the T7 vertebral body now measuring 1.3 cm in diameter (sagittal image 55, series 8, previously, 0.7 cm additionally, there has been development of a approximately 1.5 x 1.4 cm sclerotic lesion involving the posterosuperior aspect of the T1 vertebral body (image 6 as well as a approximately 2.1 x 1.6 cm bilobed sclerotic lesion involving the T11 vertebral body (image 60). No definitive pathologic compression fractures. Unchanged sclerotic lesions involving the left glenoid and scapula (images 14 and 45, series 4). IMPRESSION: 1. No evidence of pulmonary embolism. 2. Occlusion of the medial basilar segmental bronchus of the left lower lobe, nonspecific though could be seen in the setting of aspiration. Otherwise, no acute findings. 3. Apical predominant paraseptal emphysematous change with suspected mild progression of smoking related fibrosis with subpleural reticulation but without evidence of honeycombing. Emphysema (ICD10-J43.9). 4. Interval progression of osseous metastatic disease detailed above. No definitive pathologic compression fractures. 5. Hepatic steatosis. Correlation with LFTs could be performed as clinically indicated. 6. Coronary artery calcifications. Aortic Atherosclerosis (ICD10-I70.0). Electronically Signed   By: JSandi MariscalM.D.   On: 01/07/2019  10:17   Dg  Chest Port 1 View  Result Date: 01/07/2019 CLINICAL DATA:  Shortness of breath EXAM: PORTABLE CHEST 1 VIEW COMPARISON:  12/14/2017 FINDINGS: Normal heart size and mediastinal contours. Porta catheter on the right with tip at the upper cavoatrial junction. Sclerosis in the left humeral neck, also seen on 2018 comparison. IMPRESSION: No evidence of active disease. Electronically Signed   By: Monte Fantasia M.D.   On: 01/07/2019 07:16    EKG:   Orders placed or performed during the hospital encounter of 01/07/19  . ED EKG  . ED EKG    IMPRESSION AND PLAN:   #Sepsis-probably from aspiration pneumonia Admit to [place] unit Patient met septic criteria at the time of admission with elevated lactic acid and tachycardia Pancultures were obtained in the emergency department broad-spectrum antibiotics are started.  We will continue cefepime vancomycin and Flagyl CT angiogram of the chest no pulmonary embolism but possible aspiration left lower lobe bronchi is occluded Sputum culture and sensitivity  #Obstructive sleep apnea continue CPAP nightly  #Metastatic prostate cancer with sclerotic lesions in the bone Continue Decadron which is his home medication Continue outpatient follow-up with [facility] oncology as recommended Continue valacyclovir his home medication  #Chronic COPD bronchodilator treatments as needed  #Essential hypertension-blood pressure is elevated patient is on not on any medications.  We will give IV Lopressor as needed  #Tobacco abuse disorder counseled patient to quit smoking for 5 minutes. Patient is not sure whether he needs nicotine patch at this time   GI DVT prophylaxis  All the records are reviewed and case discussed with ED provider. Management plans discussed with the patient, he is in agreement.  CODE STATUS: FC   TOTAL TIME TAKING CARE OF THIS PATIENT: 45  minutes.   Note: This dictation was prepared with Dragon dictation along with smaller phrase  technology. Any transcriptional errors that result from this process are unintentional.  Nicholes Mango M.D on 01/07/2019 at 11:53 AM  Between 7am to 6pm - Pager - [phone]  After 6pm go to [email or web address] - password [email or web address]  [facility]  Office  [phone]  CC: Primary care physician; System, Provider Not In

## 2019-01-07 NOTE — ED Provider Notes (Addendum)
[facility] Emergency Department Provider Note   ____________________________________________    I have reviewed the triage vital signs and the nursing notes.   HISTORY  Chief Complaint Altered mental status    HPI Alec Hart. is a 58 y.o. male brought in by EMS for altered mental status.  Initially they felt that he was short of breath but quickly determined that he was quite confused.  Patient is unable to give significant history.  He is coming from home.  Review of care everywhere records demonstrates the patient has a history of stage IV prostate cancer unclear if he has had recent chemotherapy.  He does have a port.  Patient complains "my bones hurt "  Past Medical History:  Diagnosis Date  . Cancer [facility])    prostate ca  . Hypertension   . Pancreatitis     Patient Active Problem List   Diagnosis Date Noted  . Sepsis ([place]) 01/07/2019    Past Surgical History:  Procedure Laterality Date  . PROSTATE SURGERY      Prior to Admission medications   Medication Sig Start Date End Date Taking? Authorizing Provider  albuterol (PROVENTIL HFA;VENTOLIN HFA) 108 (90 Base) MCG/ACT inhaler Inhale 2 puffs into the lungs every 6 (six) hours as needed for wheezing. 09/26/18  Yes [provider]  b complex vitamins capsule Take 1 capsule by mouth daily.   Yes [provider]  busPIRone (BUSPAR) 5 MG tablet Take 5 mg by mouth 3 (three) times daily. 03/26/18  Yes [provider]  lidocaine (XYLOCAINE) 2 % solution Use as directed 15 mLs in the mouth or throat every 4 (four) hours as needed for mouth pain.   Yes [provider]  magic mouthwash SOLN Take 10 mLs by mouth 4 (four) times daily as needed. 11/23/18  Yes [provider]  magnesium oxide (MAG-OX) 400 MG tablet Take 400 mg by mouth 2 (two) times daily. 09/09/18  Yes [provider]  naproxen sodium (ALEVE) 220 MG tablet Take 220 mg by mouth 2 (two)  times daily as needed (pain).   Yes [provider]  omeprazole (PRILOSEC) 20 MG capsule Take 40 mg by mouth daily. 05/19/18  Yes [provider]  oxyCODONE (OXY IR/ROXICODONE) 5 MG immediate release tablet Take 5 mg by mouth every 6 (six) hours as needed for pain. 12/11/18  Yes [provider]  Pancrelipase, Lip-Prot-Amyl, (CREON) 24000-76000 units CPEP Take 24,000-76,000 Units by mouth 3 (three) times daily. Take 48,000 units of lipase by mouth three times a day with a meal. Take an additional one capsule with snacks.   Yes [provider]  potassium chloride SA (K-DUR,KLOR-CON) 20 MEQ tablet Take 60 mEq by mouth 2 (two) times daily. 11/16/18  Yes [provider]  tiotropium (SPIRIVA) 18 MCG inhalation capsule Place 1 capsule into inhaler and inhale daily. 11/30/18  Yes [provider]  traZODone (DESYREL) 50 MG tablet Take 50 mg by mouth at bedtime as needed for sleep. 11/16/18  Yes [provider]  triamterene-hydrochlorothiazide (MAXZIDE-25) 37.5-25 MG tablet Take 1 tablet by mouth daily.   Yes [provider]  valACYclovir (VALTREX) 500 MG tablet Take 500 mg by mouth daily. 11/17/18  Yes [provider]  dexamethasone (DECADRON) 4 MG tablet Take 8 mg by mouth 2 (two) times daily. 07/15/18   [provider]  oxyCODONE (OXY IR/ROXICODONE) 5 MG immediate release tablet Take 1 tablet (5 mg total) by mouth every  6 (six) hours as needed for severe pain. Patient not taking: Reported on 01/07/2019 12/14/17   Delman Kitten, MD     Allergies Penicillins  No family history on file.  Social History Social History   Tobacco Use  . Smoking status: Current Some Day Smoker    Packs/day: 0.25  . Smokeless tobacco: Never Used  Substance Use Topics  . Alcohol use: Yes    Comment: occasional  . Drug use: No    Level 5 caveat: Unable to obtain review of  Systems     ____________________________________________   PHYSICAL EXAM:  VITAL SIGNS: ED Triage Vitals [01/07/19 0655]  Enc Vitals Group     BP (!) 131/116     Pulse Rate 89     Resp (!) 28     Temp 98.2 F (36.8 C)     Temp Source Oral     SpO2 100 %     Weight      Height      Head Circumference      Peak Flow      Pain Score      Pain Loc      Pain Edu?      Excl. in [place]?     Constitutional: Alert, confused, ill-appearing Eyes: Conjunctivae are normal.  Head: Atraumatic. Nose: Significant rhinorrhea Mouth/Throat: Mucous membranes are moist.   Neck:  Painless ROM Cardiovascular: Tachycardia, regular rhythm. Grossly normal heart sounds.  Good peripheral circulation.  Port noted Respiratory: Mild tachypnea, no retractions.  Clear to auscultation Gastrointestinal: Soft and nontender. No distention.   Musculoskeletal:   Warm and well perfused Neurologic: Normal speech, however significantly confused, moving all extremities equally Skin:  Skin is warm, dry and intact. No rash noted.   ____________________________________________   LABS (all labs ordered are listed, but only abnormal results are displayed)  Labs Reviewed  CBC WITH DIFFERENTIAL/PLATELET - Abnormal; Notable for the following components:      Result Value   RBC 2.98 (*)    Hemoglobin 10.5 (*)    HCT 29.5 (*)    MCH 35.2 (*)    All other components within normal limits  COMPREHENSIVE METABOLIC PANEL - Abnormal; Notable for the following components:   CO2 20 (*)    Glucose, Bld 104 (*)    BUN 5 (*)    Calcium 8.7 (*)    Total Protein 6.4 (*)    Albumin 3.2 (*)    AST 119 (*)    All other components within normal limits  URINALYSIS, COMPLETE (UACMP) WITH MICROSCOPIC - Abnormal; Notable for the following components:   Color, Urine YELLOW (*)    APPearance CLEAR (*)    Hgb urine dipstick MODERATE (*)    All other components within normal limits  LACTIC ACID, PLASMA - Abnormal; Notable for  the following components:   Lactic Acid, Venous 3.3 (*)    All other components within normal limits  CG4 I-STAT (LACTIC ACID) - Abnormal; Notable for the following components:   Lactic Acid, Venous 3.57 (*)    All other components within normal limits  CULTURE, BLOOD (ROUTINE X 2)  CULTURE, BLOOD (ROUTINE X 2)  URINE CULTURE  MRSA PCR SCREENING  TROPONIN I  INFLUENZA PANEL BY PCR (TYPE A & B)  LIPASE, BLOOD  PROCALCITONIN  LACTIC ACID, PLASMA  CBG MONITORING, ED  I-STAT CG4 LACTIC ACID, ED   ____________________________________________  EKG  ED ECG REPORT I, Lavonia Drafts, the attending physician, personally viewed and interpreted  this ECG.  Date: 01/07/2019  Rhythm: normal sinus rhythm QRS Axis: normal Intervals: Prolonged QTC ST/T Wave abnormalities: normal Narrative Interpretation: no evidence of acute ischemia  ____________________________________________  RADIOLOGY  Chest x-ray ____________________________________________   PROCEDURES  Procedure(s) performed: No  Procedures   Critical Care performed: yes  CRITICAL CARE Performed by: Lavonia Drafts   Total critical care time: 30 minutes  Critical care time was exclusive of separately billable procedures and treating other patients.  Critical care was necessary to treat or prevent imminent or life-threatening deterioration.  Critical care was time spent personally by me on the following activities: development of treatment plan with patient and/or surrogate as well as nursing, discussions with consultants, evaluation of patient's response to treatment, examination of patient, obtaining history from patient or surrogate, ordering and performing treatments and interventions, ordering and review of laboratory studies, ordering and review of radiographic studies, pulse oximetry and re-evaluation of patient's condition.  ____________________________________________   INITIAL IMPRESSION / ASSESSMENT AND  PLAN / ED COURSE  Pertinent labs & imaging results that were available during my care of the patient were reviewed by me and considered in my medical decision making (see chart for details).  Patient presents with altered mental status.  He is afebrile with tachypnea.  No tachycardia.  Elevated i-STAT lactic although normal white blood cell count.  Differential includes possible brain metastases, infection, intracranial hemorrhage, electrolyte abnormality.  Patient given small dose of morphine IV because of his complaint of bone pain.  Will cover with broad-spectrum antibiotics given confusion and elevated lactic  No clear source for elevated lactic acid, continues to be confused CT head unremarkable, CT chest no PE.  Will admit to the hospital service, discussed with Dr. Margaretmary Eddy    ____________________________________________   FINAL CLINICAL IMPRESSION(S) / ED DIAGNOSES  Final diagnoses:  Shortness of breath  Altered mental status, unspecified altered mental status type        Note:  This document was prepared using Dragon voice recognition software and may include unintentional dictation errors.   Lavonia Drafts, MD 01/07/19 1311    Lavonia Drafts, MD 01/22/19 1340

## 2019-01-07 NOTE — ED Notes (Signed)
Patient appearsrestless but denies any pain or additional comfort needs. Patient states he feels depressed being in the hospital. Denies SI/HI. Will continue to monitor.

## 2019-01-08 LAB — BLOOD CULTURE ID PANEL (REFLEXED)
Acinetobacter baumannii: NOT DETECTED
CANDIDA KRUSEI: NOT DETECTED
CANDIDA PARAPSILOSIS: NOT DETECTED
Candida albicans: NOT DETECTED
Candida glabrata: NOT DETECTED
Candida tropicalis: NOT DETECTED
ENTEROCOCCUS SPECIES: NOT DETECTED
Enterobacter cloacae complex: NOT DETECTED
Enterobacteriaceae species: NOT DETECTED
Escherichia coli: NOT DETECTED
Haemophilus influenzae: NOT DETECTED
Klebsiella oxytoca: NOT DETECTED
Klebsiella pneumoniae: NOT DETECTED
Listeria monocytogenes: NOT DETECTED
Neisseria meningitidis: NOT DETECTED
PSEUDOMONAS AERUGINOSA: NOT DETECTED
Proteus species: NOT DETECTED
STREPTOCOCCUS PNEUMONIAE: NOT DETECTED
STREPTOCOCCUS PYOGENES: NOT DETECTED
Serratia marcescens: NOT DETECTED
Staphylococcus aureus (BCID): NOT DETECTED
Staphylococcus species: NOT DETECTED
Streptococcus agalactiae: NOT DETECTED
Streptococcus species: NOT DETECTED

## 2019-01-08 LAB — URINE CULTURE: Culture: <10000 — AB

## 2019-01-08 LAB — PROTIME-INR
INR: 1.16
Prothrombin Time: 14.7 seconds (ref 11.4–15.2)

## 2019-01-08 LAB — MRSA PCR SCREENING: MRSA by PCR: NEGATIVE

## 2019-01-08 LAB — CORTISOL-AM, BLOOD: CORTISOL - AM: 5.6 ug/dL — AB (ref 6.7–22.6)

## 2019-01-08 LAB — PROCALCITONIN: Procalcitonin: 0.1 ng/mL

## 2019-01-08 MED ORDER — LEVOFLOXACIN 750 MG PO TABS: 750.0000 mg | ORAL_TABLET | Freq: Every day | ORAL | 0 refills | Status: AC

## 2019-01-08 MED ORDER — ENSURE ENLIVE PO LIQD
237.0000 mL | Freq: Two times a day (BID) | ORAL | Status: DC
  Administered 2019-01-08: 237 mL via ORAL

## 2019-01-08 NOTE — Evaluation (Signed)
Physical Therapy Evaluation Patient Details Name: Alec Hart. MRN: [id number] DOB: [birth year] Today's Date: 01/08/2019   History of Present Illness  58 y.o. 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  Admitted with sepsis.  Clinical Impression  Pt did well with all aspects of PT exam.  He was able to easily circumambulate the nurses' station, negotiate steps and generally showed good strength and balance.  He and 2 brothers agree that he is near his baseline, he does not need further PT intervention here or after d/c.  PT signing off.     Follow Up Recommendations No PT follow up    Equipment Recommendations  None recommended by PT    Recommendations for Other Services       Precautions / Restrictions Precautions Precautions: Fall Restrictions Weight Bearing Restrictions: No      Mobility  Bed Mobility Overal bed mobility: Independent                Transfers Overall transfer level: Independent Equipment used: None             General transfer comment: Pt able to rise and maintain balance with good confidence and safety  Ambulation/Gait Ambulation/Gait assistance: Modified independent (Device/Increase time) Gait Distance (Feet): 250 Feet Assistive device: None       General Gait Details: Pt with mild lean/limp that he and brother report is his baseline.  He had no LOBs or safety issues, minimal fatigue.   Stairs Stairs: Yes Stairs assistance: Independent Stair Management: Two rails Number of Stairs: 6 General stair comments: Pt able to negotiate up/down steps well  Wheelchair Mobility    Modified Rankin (Stroke Patients Only)       Balance Overall balance assessment: Independent                                            Pertinent Vitals/Pain Pain Assessment: No/denies pain    Home Living Family/patient expects to be discharged to:: Private residence Living Arrangements: Other relatives Available Help at Discharge: Family(another brother drives, runs errands, etc for them)   Home Access: Stairs to enter Entrance Stairs-Rails: Can reach both Entrance Stairs-Number of Steps: 5          Prior Function Level of Independence: Independent         Comments: pt does not drive, is able to be active in community     Hand Dominance        Extremity/Trunk Assessment   Upper Extremity Assessment Upper Extremity Assessment: Overall WFL for tasks assessed    Lower Extremity Assessment Lower Extremity Assessment: Overall WFL for tasks assessed       Communication   Communication: No difficulties  Cognition Arousal/Alertness: Awake/alert Behavior During Therapy: WFL for tasks assessed/performed Overall Cognitive Status: Within Functional Limits for tasks assessed                                        General Comments      Exercises     Assessment/Plan    PT Assessment Patent does not need any further PT  services  PT Problem List         PT Treatment Interventions      PT Goals (Current goals can be found in the Care Plan section)  Acute Rehab PT Goals Patient Stated Goal: go home PT Goal Formulation: All assessment and education complete, DC therapy    Frequency     Barriers to discharge        Co-evaluation               AM-PAC PT "6 Clicks" Mobility  Outcome Measure Help needed turning from your back to your side while in a flat bed without using bedrails?: None Help needed moving from lying on your back to sitting on the side of a flat bed without using bedrails?: None Help needed moving to and from a bed to a chair (including a wheelchair)?: None Help needed standing up from a chair using your arms (e.g., wheelchair or  bedside chair)?: None Help needed to walk in hospital room?: None Help needed climbing 3-5 steps with a railing? : None 6 Click Score: 24    End of Session Equipment Utilized During Treatment: Gait belt Activity Tolerance: Patient tolerated treatment well Patient left: with call bell/phone within reach;with chair alarm set;with family/visitor present   PT Visit Diagnosis: Difficulty in walking, not elsewhere classified (R26.2);Muscle weakness (generalized) (M62.81)    Time: 9509-3267 PT Time Calculation (min) (ACUTE ONLY): 23 min   Charges:   PT Evaluation $PT Eval Low Complexity: 1 Low          Kreg Shropshire, DPT 01/08/2019, 1:35 PM

## 2019-01-08 NOTE — Progress Notes (Signed)
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  GOAL:   Patient will meet greater than or equal to 90% of their needs  MONITOR:   PO intake, Supplement acceptance, Weight trends, Labs  REASON FOR ASSESSMENT:   Malnutrition Screening Tool    ASSESSMENT:   58 year old male who presented to the ED on 1/16 with AMS. PMH significant for metastatic stage IV prostate cancer with sclerotic bone lesions, OSA, HTN, tobacco use, pancreatitis. Pt admitted with sepsis.  Spoke with pt at bedside who reports that he was feeling better but then took several medications and now feels nauseous. Pt shares that this does not happen often.  Pt reports that he "eats real good" sometimes and then other days does not eat much. Pt states that he has no appetite today. Pt states that he ate one bite of biscuit, one bite of sausage, and one bite of eggs this morning. Pt states, "the chemo kills my appetite." Pt also reports that the chemo "makes my mouth swell" which causes difficulty swallowing at times.  Pt shares that at home, he does not have any real meals but does drink one Ensure in the morning and one in the afternoon. RD encouraged pt to continue doing this after d/c and increase to three Ensure if able. Pt is amenable to receiving Ensure Enlive during admission. RD to  order Magic Cup with meal trays as well.  Pt endorses weight loss over time and reports his UBW as 168 lbs. Pt states that he weighed this "months ago." Weight history in chart is limited as last weight PTA is from 12/14/17. Admit weight of 165 lbs appears stated rather than measured.  Discussed the importance of adequate PO intake. Discussed pt's increased nutrient needs related to cancer and cancer-related treatments. Discussed importance of meeting kcal and protein needs to maintain lean muscle mass and prevent further weight loss. Pt expresses understanding.  Medications reviewed and include: Creon 12,000 units TID, magnesium oxide 400 mg BID, rena-vit daily, Protonix, K-dur 60 mEq BID, IV Maxipime  Labs reviewed: BUN 5 (H)  NUTRITION - FOCUSED PHYSICAL EXAM:    Most Recent Value  Orbital Region  Mild depletion  Upper Arm Region  Moderate depletion  Thoracic and Lumbar Region  Mild depletion  Buccal Region  Mild depletion  Temple Region  Mild depletion  Clavicle Bone Region  Moderate depletion  Clavicle and Acromion Bone Region  Moderate depletion  Scapular Bone Region  Moderate depletion  Dorsal Hand  Mild depletion  Patellar Region  Moderate depletion  Anterior Thigh Region  Moderate depletion  Posterior Calf Region  Moderate depletion  Edema (RD Assessment)  None  Hair  Reviewed  Eyes  Reviewed  Mouth  Reviewed [poor dentition, several missing teeth]  Skin  Reviewed  Nails  Reviewed       Diet Order:   Diet Order  Diet - low sodium heart healthy        Diet regular Room service appropriate? Yes; Fluid consistency: Thin  Diet effective now              EDUCATION NEEDS:   Education needs have been addressed  Skin:  Skin Assessment: Reviewed RN Assessment  Last BM:  1/17  Height:   Ht Readings from Last 1 Encounters:  01/07/19 5\' 8"  (1.727 m)    Weight:   Wt Readings from Last 1 Encounters:  01/07/19 74.8 kg    Ideal Body Weight:  70  kg  BMI:  Body mass index is 25.09 kg/m.  Estimated Nutritional Needs:   Kcal:  2100-2300  Protein:  105-120 grams  Fluid:  >/= 2.0 L    Gaynell Face, MS, RD, LDN Inpatient Clinical Dietitian Pager: [phone] Weekend/After Hours: [phone]

## 2019-01-08 NOTE — Discharge Summary (Signed)
[place] [facility] [place] NAME: Alec Hart    MR#:  [id number]  DATE OF BIRTH:  January 06, 1961  DATE OF ADMISSION:  01/07/2019 ADMITTING PHYSICIAN: Nicholes Mango, MD  DATE OF DISCHARGE: 01/08/2019  PRIMARY CARE PHYSICIAN: Katheren Shams, MD    ADMISSION DIAGNOSIS:  Shortness of breath [R06.02] Altered mental status, unspecified altered mental status type [R41.82]  DISCHARGE DIAGNOSIS:  Active Problems:   Sepsis ([place])   SECONDARY DIAGNOSIS:   Past Medical History:  Diagnosis Date  . Cancer [name])    prostate ca  . COPD (chronic obstructive pulmonary disease) ([place])   . Hypertension   . Pancreatitis     HOSPITAL COURSE:   58 year old male with past medical history of prostate cancer, COPD, hypertension, chronic pancreatitis was admitted to the hospital due to suspected sepsis/pneumonia.  1.  Sepsis-patient met criteria admission given patient's lactic acid elevation, tachycardia and CT chest findings suggestive of pneumonia. - Patient was admitted to the hospital started on broad-spectrum IV antibiotics with vancomycin, cefepime, Flagyl.  Patient has been afebrile and hemodynamically stable.  He has clinically improved. - His cultures are consistent with contamination.  He is being discharged on oral Levaquin for additional 5 days for treatment of his underlying pneumonia.  2.  Pneumonia- suspected source of patient's sepsis. - Respiratory status has improved with treatment of IV antibiotics with vancomycin, cefepime, Flagyl. -Patient's cultures are consistent with contamination.  He is being discharged on oral Levaquin for additional 5 days.    3.  Metastatic prostate cancer-patient is clinically stable and will continue follow-up with oncology at [facility]. -Patient will continue his Decadron, oxycodone for pain.  4.  History of chronic pancreatitis-patient will continue his Creon supplements.  5.  GERD-patient will continue his  omeprazole.  6.  COPD-patient had no acute exacerbation.  He will continue his Spiriva as stated below.  7.  Essential hypertension-patient will continue his triamterene/HCTZ.  DISCHARGE CONDITIONS:   Stable  CONSULTS OBTAINED:    DRUG ALLERGIES:   Allergies  Allergen Reactions  . Penicillins     DISCHARGE MEDICATIONS:   Allergies as of 01/08/2019      Reactions   Penicillins       Medication List    TAKE these medications   albuterol 108 (90 Base) MCG/ACT inhaler Commonly known as:  PROVENTIL HFA;VENTOLIN HFA Inhale 2 puffs into the lungs every 6 (six) hours as needed for wheezing.   b complex vitamins capsule Take 1 capsule by mouth daily.   busPIRone 5 MG tablet Commonly known as:  BUSPAR Take 5 mg by mouth 3 (three) times daily.   CREON 24000-76000 units Cpep Generic drug:  Pancrelipase (Lip-Prot-Amyl) Take 24,000-76,000 Units by mouth 3 (three) times daily. Take 48,000 units of lipase by mouth three times a day with a meal. Take an additional one capsule with snacks.   dexamethasone 4 MG tablet Commonly known as:  DECADRON Take 8 mg by mouth 2 (two) times daily.   levofloxacin 750 MG tablet Commonly known as:  LEVAQUIN Take 1 tablet (750 mg total) by mouth daily for 5 days.   lidocaine 2 % solution Commonly known as:  XYLOCAINE Use as directed 15 mLs in the mouth or throat every 4 (four) hours as needed for mouth pain.   magic mouthwash Soln Take 10 mLs by mouth 4 (four) times daily as needed.   magnesium oxide 400 MG tablet Commonly known as:  MAG-OX Take 400 mg by  mouth 2 (two) times daily.   naproxen sodium 220 MG tablet Commonly known as:  ALEVE Take 220 mg by mouth 2 (two) times daily as needed (pain).   omeprazole 20 MG capsule Commonly known as:  PRILOSEC Take 40 mg by mouth daily.   oxyCODONE 5 MG immediate release tablet Commonly known as:  Oxy IR/ROXICODONE Take 5 mg by mouth every 6 (six) hours as needed for pain. What changed:   Another medication with the same name was removed. Continue taking this medication, and follow the directions you see here.   potassium chloride SA 20 MEQ tablet Commonly known as:  K-DUR,KLOR-CON Take 60 mEq by mouth 2 (two) times daily.   tiotropium 18 MCG inhalation capsule Commonly known as:  SPIRIVA Place 1 capsule into inhaler and inhale daily.   traZODone 50 MG tablet Commonly known as:  DESYREL Take 50 mg by mouth at bedtime as needed for sleep.   triamterene-hydrochlorothiazide 37.5-25 MG tablet Commonly known as:  MAXZIDE-25 Take 1 tablet by mouth daily.   valACYclovir 500 MG tablet Commonly known as:  VALTREX Take 500 mg by mouth daily.         DISCHARGE INSTRUCTIONS:   DIET:  Cardiac diet  DISCHARGE CONDITION:  Stable  ACTIVITY:  Activity as tolerated  OXYGEN:  Home Oxygen: No.   Oxygen Delivery: room air  DISCHARGE LOCATION:  home   If you experience worsening of your admission symptoms, develop shortness of breath, life threatening emergency, suicidal or homicidal thoughts you must seek medical attention immediately by calling 911 or calling your MD immediately  if symptoms less severe.  You Must read complete instructions/literature along with all the possible adverse reactions/side effects for all the Medicines you take and that have been prescribed to you. Take any new Medicines after you have completely understood and accpet all the possible adverse reactions/side effects.   Please note  You were cared for by a hospitalist during your hospital stay. If you have any questions about your discharge medications or the care you received while you were in the hospital after you are discharged, you can call the unit and asked to speak with the hospitalist on call if the hospitalist that took care of you is not available. Once you are discharged, your primary care physician will handle any further medical issues. Please note that NO REFILLS for any  discharge medications will be authorized once you are discharged, as it is imperative that you return to your primary care physician (or establish a relationship with a primary care physician if you do not have one) for your aftercare needs so that they can reassess your need for medications and monitor your lab values.     Today   No fever overnight, white cell count is normal, potassium levels have improved.  Patient denies any worsening shortness of breath, hypoxemia.  Will discharge home home on oral antibiotics.  VITAL SIGNS:  Blood pressure 131/71, pulse 86, temperature 98.2 F (36.8 C), resp. rate 18, height '5\' 8"'  (1.727 m), weight 74.8 kg, SpO2 97 %.  I/O:    Intake/Output Summary (Last 24 hours) at 01/08/2019 1518 Last data filed at 01/08/2019 1500 Gross per 24 hour  Intake 1578.87 ml  Output -  Net 1578.87 ml    PHYSICAL EXAMINATION:  GENERAL:  58 y.o.-year-old patient lying in the bed with no acute distress.  EYES: Pupils equal, round, reactive to light and accommodation. No scleral icterus. Extraocular muscles intact.  HEENT: Head atraumatic,  normocephalic. Oropharynx and nasopharynx clear.  NECK:  Supple, no jugular venous distention. No thyroid enlargement, no tenderness.  LUNGS: Normal breath sounds bilaterally, no wheezing, rales,rhonchi. No use of accessory muscles of respiration.  CARDIOVASCULAR: S1, S2 normal. No murmurs, rubs, or gallops.  ABDOMEN: Soft, non-tender, non-distended. Bowel sounds present. No organomegaly or mass.  EXTREMITIES: No pedal edema, cyanosis, or clubbing.  NEUROLOGIC: Cranial nerves II through XII are intact. No focal motor or sensory defecits b/l.  PSYCHIATRIC: The patient is alert and oriented x 3.   SKIN: No obvious rash, lesion, or ulcer.   DATA REVIEW:   CBC Recent Labs  Lab 01/07/19 0654  WBC 6.2  HGB 10.5*  HCT 29.5*  PLT 196    Chemistries  Recent Labs  Lab 01/07/19 0654  NA 140  K 3.7  CL 110  CO2 20*  GLUCOSE  104*  BUN 5*  CREATININE 0.96  CALCIUM 8.7*  AST 119*  ALT 23  ALKPHOS 89  BILITOT 0.9    Cardiac Enzymes Recent Labs  Lab 01/07/19 0654  TROPONINI <0.03    Microbiology Results  Results for orders placed or performed during the hospital encounter of 01/07/19  MRSA PCR Screening     Status: None   Collection Time: 01/07/19  4:24 AM  Result Value Ref Range Status   MRSA by PCR NEGATIVE NEGATIVE Final    Comment:        The GeneXpert MRSA Assay (FDA approved for NASAL specimens only), is one component of a comprehensive MRSA colonization surveillance program. It is not intended to diagnose MRSA infection nor to guide or monitor treatment for MRSA infections. Performed at [facility], [address] [place], [place] [address]   Urine culture     Status: Abnormal   Collection Time: 01/07/19  7:38 AM  Result Value Ref Range Status   Specimen Description   Final    URINE, RANDOM Performed at [facility], [address], [place], [place] [address]    Special Requests   Final    NONE Performed at [facility], [address] [place], [place] [address]    Culture (A)  Final    <10,000 COLONIES/mL INSIGNIFICANT GROWTH Performed at [place] [address] [place], [place] [address]    Report Status 01/08/2019 FINAL  Final  Blood culture (routine x 2)     Status: None (Preliminary result)   Collection Time: 01/07/19  7:43 AM  Result Value Ref Range Status   Specimen Description BLOOD RIGHT AC  Final   Special Requests   Final    BOTTLES DRAWN AEROBIC AND ANAEROBIC Blood Culture adequate volume   Culture   Final    NO GROWTH < 24 HOURS Performed at [facility], [address] [place], [place] [address]    Report Status PENDING  Incomplete  Blood culture (routine x 2)     Status: None (Preliminary result)   Collection Time: 01/07/19  7:43 AM  Result Value Ref Range Status   Specimen Description   Final     BLOOD LEFT FOREARM Performed at [facility], [place] [address] [place], [place] [address]    Special Requests   Final    BOTTLES DRAWN AEROBIC AND ANAEROBIC Blood Culture adequate volume   Culture  Setup Time   Final    Organism ID to follow GRAM POSITIVE COCCI IN BOTH AEROBIC AND ANAEROBIC BOTTLES CRITICAL RESULT CALLED TO, READ BACK  BY AND VERIFIED WITH: [facility] ZOMPA AT 0277 01/08/19 SDR Performed at [facility], [address] [place], [place] [address]    Culture [facility] POSITIVE COCCI  Final   Report Status PENDING  Incomplete  Blood Culture ID Panel (Reflexed)     Status: None   Collection Time: 01/07/19  7:43 AM  Result Value Ref Range Status   Enterococcus species NOT DETECTED NOT DETECTED Final   Listeria monocytogenes NOT DETECTED NOT DETECTED Final   Staphylococcus species NOT DETECTED NOT DETECTED Final   Staphylococcus aureus (BCID) NOT DETECTED NOT DETECTED Final   Streptococcus species NOT DETECTED NOT DETECTED Final   Streptococcus agalactiae NOT DETECTED NOT DETECTED Final   Streptococcus pneumoniae NOT DETECTED NOT DETECTED Final   Streptococcus pyogenes NOT DETECTED NOT DETECTED Final   Acinetobacter baumannii NOT DETECTED NOT DETECTED Final   Enterobacteriaceae species NOT DETECTED NOT DETECTED Final   Enterobacter cloacae complex NOT DETECTED NOT DETECTED Final   Escherichia coli NOT DETECTED NOT DETECTED Final   Klebsiella oxytoca NOT DETECTED NOT DETECTED Final   Klebsiella pneumoniae NOT DETECTED NOT DETECTED Final   Proteus species NOT DETECTED NOT DETECTED Final   Serratia marcescens NOT DETECTED NOT DETECTED Final   Haemophilus influenzae NOT DETECTED NOT DETECTED Final   Neisseria meningitidis NOT DETECTED NOT DETECTED Final   Pseudomonas aeruginosa NOT DETECTED NOT DETECTED Final   Candida albicans NOT DETECTED NOT DETECTED Final   Candida glabrata NOT DETECTED NOT DETECTED Final   Candida krusei NOT DETECTED NOT DETECTED Final   Candida  parapsilosis NOT DETECTED NOT DETECTED Final   Candida tropicalis NOT DETECTED NOT DETECTED Final    Comment: Performed at [facility], [address] [place], [place] [address]    RADIOLOGY:  Ct Head Wo Contrast  Result Date: 01/07/2019 CLINICAL DATA:  Unexplained altered level of consciousness EXAM: CT HEAD WITHOUT CONTRAST TECHNIQUE: Contiguous axial images were obtained from the base of the skull through the vertex without intravenous contrast. COMPARISON:  12/14/2017 FINDINGS: Brain: No evidence of acute infarction, hemorrhage, hydrocephalus, extra-axial collection or mass lesion/mass effect. Cerebral volume loss that is generalized. Vascular: No hyperdense vessel or unexpected calcification. Skull: No acute finding. Sclerotic foci in the inferior right occipital bone and high anterior right parietal bone are stable from prior in this patient with history of metastatic prostate cancer. Sinuses/Orbits: Left more than right ethmoid and left maxillary sinus mucosal thickening IMPRESSION: 1. No acute intracranial finding or change from 2018. 2. Sinusitis. Electronically Signed   By: Monte Fantasia M.D.   On: 01/07/2019 07:40   Ct Angio Chest Pe W And/or Wo Contrast  Result Date: 01/07/2019 CLINICAL DATA:  Altered mental status. Difficulty breathing. History of prostate cancer. Evaluate pulmonary embolism. EXAM: CT ANGIOGRAPHY CHEST WITH CONTRAST TECHNIQUE: Multidetector CT imaging of the chest was performed using the standard protocol during bolus administration of intravenous contrast. Multiplanar CT image reconstructions and MIPs were obtained to evaluate the vascular anatomy. CONTRAST:  14m OMNIPAQUE IOHEXOL 350 MG/ML SOLN COMPARISON:  Chest CT - 12/14/2017; 04/20/2015 FINDINGS: Vascular Findings: There is adequate opacification of the pulmonary arterial system with the main pulmonary artery measuring 466 Hounsfield units. There are no discrete filling defects within the pulmonary  arterial tree to suggest pulmonary embolism. Normal caliber of the main pulmonary artery. Normal heart size. Coronary artery calcifications. No pericardial effusion. No evidence of thoracic aortic aneurysm or dissection on this nongated examination. Atherosclerotic plaque within the aortic arch and descending thoracic aorta,  not resulting in hemodynamically significant stenosis. The left vertebral artery is incidentally noted to arise directly from the aortic arch. The branch vessels of the aortic arch appear widely patent throughout their imaged courses. Right jugular approach port a catheter tip terminates within the superior cavoatrial junction. Review of the MIP images confirms the above findings. ---------------------------------------------------------------------------------- Nonvascular Findings: Mediastinum/Lymph Nodes: No bulky mediastinal, hilar or axillary lymphadenopathy. Lungs/Pleura: Minimal apical predominant paraseptal emphysematous changes, most conspicuous within right lung apex, unchanged. Occlusion of the medial basilar segmental bronchus of the left lower lobe (image 62, series 6). The remaining central pulmonary airways appear widely patent. Progressive subpleural reticular opacities about the nondependent portion of the bilateral upper lobes (representative image 39, series 6), without associated honeycombing. Nodular thickening along the right major fissure (image 49, series 6), is unchanged and favored to represent a perifissural lymph node. No pleural effusion or pneumothorax. Upper abdomen: Limited early arterial phase evaluation of the upper abdomen demonstrates diffuse decreased attenuation hepatic parenchyma suggestive of hepatic steatosis. Note is made of a punctate granuloma within the right lobe of the liver (image 76, series 4). There is an approximately 3.0 cm hypoattenuating lesion within the dome of the left kidney, incompletely characterize of favored to represent a renal cyst.  Musculoskeletal: Progressive osseous metastatic disease with index sclerotic lesion within the T7 vertebral body now measuring 1.3 cm in diameter (sagittal image 55, series 8, previously, 0.7 cm additionally, there has been development of a approximately 1.5 x 1.4 cm sclerotic lesion involving the posterosuperior aspect of the T1 vertebral body (image 6 as well as a approximately 2.1 x 1.6 cm bilobed sclerotic lesion involving the T11 vertebral body (image 60). No definitive pathologic compression fractures. Unchanged sclerotic lesions involving the left glenoid and scapula (images 14 and 45, series 4). IMPRESSION: 1. No evidence of pulmonary embolism. 2. Occlusion of the medial basilar segmental bronchus of the left lower lobe, nonspecific though could be seen in the setting of aspiration. Otherwise, no acute findings. 3. Apical predominant paraseptal emphysematous change with suspected mild progression of smoking related fibrosis with subpleural reticulation but without evidence of honeycombing. Emphysema (ICD10-J43.9). 4. Interval progression of osseous metastatic disease detailed above. No definitive pathologic compression fractures. 5. Hepatic steatosis. Correlation with LFTs could be performed as clinically indicated. 6. Coronary artery calcifications. Aortic Atherosclerosis (ICD10-I70.0). Electronically Signed   By: Sandi Mariscal M.D.   On: 01/07/2019 10:17   Dg Chest Port 1 View  Result Date: 01/07/2019 CLINICAL DATA:  Shortness of breath EXAM: PORTABLE CHEST 1 VIEW COMPARISON:  12/14/2017 FINDINGS: Normal heart size and mediastinal contours. Porta catheter on the right with tip at the upper cavoatrial junction. Sclerosis in the left humeral neck, also seen on 2018 comparison. IMPRESSION: No evidence of active disease. Electronically Signed   By: Monte Fantasia M.D.   On: 01/07/2019 07:16      Management plans discussed with the patient, family and they are in agreement.  CODE STATUS:     Code  Status Orders  (From admission, onward)         Start     Ordered   01/07/19 1616  Full code  Continuous     01/07/19 1615        Code Status History    This patient has a current code status but no historical code status.      TOTAL TIME TAKING CARE OF THIS PATIENT: 40 minutes.    Henreitta Leber M.D on 01/08/2019 at 3:18 PM  Between 7am to 6pm - Pager - [phone]  After 6pm go to [email or web address] - Proofreader  [facility]  Office  [phone]  CC: Primary care physician; Katheren Shams, MD

## 2019-01-08 NOTE — Clinical Social Work Note (Signed)
CSW received consult for "Financial problems getting medication". CSW notified RNCM of consult. Currently no CSW needs. Please re consult if further needs arise.   [name], [place]

## 2019-01-08 NOTE — Care Management Note (Signed)
Case Management Note  Patient Details  Name: Alec Hart. MRN: [id number] Date of Birth: 08/11/1961  Subjective/Objective:                Consult was placed for social worker due to financial concerns obtaining medications.  CSW deferred to CM.  Patient has medicaid and pays three dollars for his prescriptions.  Gave permission for CM to contact [facility] on [address].  Patient is up to date on obtaining his current medications.  He is followed by [facility] for his prostate cancer.  He lives with his brother and receives around 7 hundred dollars under SSI. Discussed that [facility] may be able to provide some ongoing assist with his 3 dollar co pays.  Also instructed patient that on occasion, pharmacies may waive the copay but not on an ongoing basis.   Action/Plan:   Expected Discharge Date:  01/08/19               Expected Discharge Plan:     In-House Referral:     Discharge planning Services     Post Acute Care Choice:    Choice offered to:     DME Arranged:    DME Agency:     HH Arranged:    HH Agency:     Status of Service:     If discussed at [employer], dates discussed:    Additional Comments:  Katrina Stack, RN 01/08/2019, 2:02 PM

## 2019-01-08 NOTE — Progress Notes (Signed)
[place] CRITICAL VALUE ALERT - BLOOD CULTURE IDENTIFICATION (BCID)  Alec Debes. is an 58 y.o. male who presented to [facility] on 01/07/2019 with a chief complaint of SOB  Assessment:  Started on antibiotics for possible aspiration pneumonia. GPC 1 out of 2 sets. Nothing detected on BCID  Name of physician (or Provider) Contacted: [facility]  Current antibiotics: Cefipime  Changes to prescribed antibiotics recommended:  Patient being discharged today on oral Levaquin.  Results for orders placed or performed during the hospital encounter of 01/07/19  Blood Culture ID Panel (Reflexed) (Collected: 01/07/2019  7:43 AM)  Result Value Ref Range   Enterococcus species NOT DETECTED NOT DETECTED   Listeria monocytogenes NOT DETECTED NOT DETECTED   Staphylococcus species NOT DETECTED NOT DETECTED   Staphylococcus aureus (BCID) NOT DETECTED NOT DETECTED   Streptococcus species NOT DETECTED NOT DETECTED   Streptococcus agalactiae NOT DETECTED NOT DETECTED   Streptococcus pneumoniae NOT DETECTED NOT DETECTED   Streptococcus pyogenes NOT DETECTED NOT DETECTED   Acinetobacter baumannii NOT DETECTED NOT DETECTED   Enterobacteriaceae species NOT DETECTED NOT DETECTED   Enterobacter cloacae complex NOT DETECTED NOT DETECTED   Escherichia coli NOT DETECTED NOT DETECTED   Klebsiella oxytoca NOT DETECTED NOT DETECTED   Klebsiella pneumoniae NOT DETECTED NOT DETECTED   Proteus species NOT DETECTED NOT DETECTED   Serratia marcescens NOT DETECTED NOT DETECTED   Haemophilus influenzae NOT DETECTED NOT DETECTED   Neisseria meningitidis NOT DETECTED NOT DETECTED   Pseudomonas aeruginosa NOT DETECTED NOT DETECTED   Candida albicans NOT DETECTED NOT DETECTED   Candida glabrata NOT DETECTED NOT DETECTED   Candida krusei NOT DETECTED NOT DETECTED   Candida parapsilosis NOT DETECTED NOT DETECTED   Candida tropicalis NOT DETECTED NOT DETECTED    Alec Hart 01/08/2019  2:39  PM

## 2019-01-09 LAB — HIV ANTIBODY (ROUTINE TESTING W REFLEX): HIV SCREEN 4TH GENERATION: NONREACTIVE

## 2019-01-10 LAB — CULTURE, BLOOD (ROUTINE X 2): Special Requests: ADEQUATE

## 2019-01-12 LAB — CULTURE, BLOOD (ROUTINE X 2)
Culture: NO GROWTH
Special Requests: ADEQUATE

## 2019-01-19 ENCOUNTER — Ambulatory Visit: Admit: 2019-01-19 | Discharge: 2019-01-20 | Payer: MEDICAID

## 2019-01-19 DIAGNOSIS — L989 Disorder of the skin and subcutaneous tissue, unspecified: Secondary | ICD-10-CM

## 2019-01-19 DIAGNOSIS — C61 Malignant neoplasm of prostate: Secondary | ICD-10-CM

## 2019-01-19 DIAGNOSIS — Z09 Encounter for follow-up examination after completed treatment for conditions other than malignant neoplasm: Principal | ICD-10-CM

## 2019-01-19 DIAGNOSIS — J449 Chronic obstructive pulmonary disease, unspecified: Secondary | ICD-10-CM

## 2019-01-19 DIAGNOSIS — Z8701 Personal history of pneumonia (recurrent): Secondary | ICD-10-CM

## 2019-01-19 MED ORDER — MAGNESIUM OXIDE 400 MG (241.3 MG MAGNESIUM) TABLET
ORAL_TABLET | Freq: Two times a day (BID) | ORAL | 3 refills | 0 days | Status: CP
Start: 2019-01-19 — End: 2019-06-14

## 2019-01-19 MED ORDER — TRAZODONE 50 MG TABLET
ORAL_TABLET | Freq: Every evening | ORAL | 3 refills | 0.00000 days | Status: CP | PRN
Start: 2019-01-19 — End: 2019-01-27

## 2019-01-19 MED ORDER — SPIRIVA WITH HANDIHALER 18 MCG AND INHALATION CAPSULES
ORAL_CAPSULE | Freq: Every day | RESPIRATORY_TRACT | 3 refills | 0 days | Status: CP
Start: 2019-01-19 — End: ?

## 2019-01-19 MED ORDER — VALACYCLOVIR 500 MG TABLET
ORAL_TABLET | Freq: Every day | ORAL | 3 refills | 0 days | Status: CP
Start: 2019-01-19 — End: ?

## 2019-01-20 ENCOUNTER — Ambulatory Visit: Admit: 2019-01-20 | Discharge: 2019-02-18 | Payer: MEDICAID

## 2019-01-20 ENCOUNTER — Ambulatory Visit: Admit: 2019-01-20 | Discharge: 2019-02-02 | Payer: MEDICAID

## 2019-01-20 DIAGNOSIS — C61 Malignant neoplasm of prostate: Principal | ICD-10-CM

## 2019-01-27 MED ORDER — TRAZODONE 50 MG TABLET
ORAL_TABLET | Freq: Every evening | ORAL | 3 refills | 0.00000 days | Status: SS | PRN
Start: 2019-01-27 — End: 2019-06-23

## 2019-01-27 MED ORDER — OXYCODONE 5 MG TABLET
ORAL_TABLET | Freq: Four times a day (QID) | ORAL | 0 refills | 0 days | Status: CP | PRN
Start: 2019-01-27 — End: 2019-03-01

## 2019-02-01 NOTE — Unmapped (Signed)
Initial Visit Note    Patient Name: Joseph Phillips  Patient Age: 58 y.o.  Encounter Date: 02/02/2019    Referring Physician:     Towanda Malkin, MD  [address]  [address] [facility] [id number]  [place], [place] [address]    Primary Care Provider:  Unknown Foley, MD    Assessment/Plan:    Reason for visit:  Prostate Cancer    Cancer Staging  Prostate cancer (CMS-HCC)  Staging form: Prostate, AJCC 7th Edition  - Clinical: Stage IV (T3a, N0, M1b, PSA: Less than 10, Gleason 7) - Unsigned      --Prostate cancer, stage IV, with progression on Lupron/Zytiga     --11/11/2018: Neutropenic fever, with possible UTI and oral mucositis  --Hypokalemia  --Hypomagnesemia  --Leg cramps and pain, exacerbated by electrolyte disturbances  --Teeth in poor repair, awaiting dental correction  --Hypertension  --Lower extremity swelling, exacerbated at night; improved on triamterene-HCTZ  -- OSA; positive study for both central and obstructive apnea; CPAP trial recommended, 07/23/2018; deferred by the patient  --History of pancreatitis with pancreatic insufficiency; on     Recommendation  --Patient did not tolerate the last cycle of docetaxel, and has evidence of progression of disease in his bones.  We discussed treatment options, and will embark on a trial of Xtandi 160 mg daily, with continued use of Lupron.  He will return on March 9 for the next Lupron dose, and follow-up'.  --The patient has been directed to get his dental care completed so that we may start on Xgeva.  Hopefully, this agent will be is also started on March 9.  --The etiology of the patient's nausea is not entirely clear.  He will try Compazine, and let us know if this is not effective.  --Patient's blood pressure has become significantly elevated.  He was previously on amlodipine and hydrochlorothiazide (which was then switched to triamterene hydrochlorothiazide, due to hypo-kalemia).  Amlodipine will be restarted.  Additional agents will be considered as needed.  --We have this discussed getting an MRI of the lower back to see if there is significant spinal stenosis or nerve root compression to explain the pain the patient is experiencing in his lower extremities.  As the pain is not his chief problem at the present time, we will delay getting the MRI, but consider doing so if his symptoms worsen.  In the meantime, as the bone scan does not show significant disease of the lumbar spine, we will ask him to try the standard exercises for nerve root compression.          I have reviewed the laboratory, pathology, and radiology reports in detail and discussed findings with the patient    History of Present Illness:     Joseph Phillips is a 58 y.o. male who is seen in consultation at the request of Lonn Georgia* for an evaluation of Prostate Cancer.  The patient received cycle #5 of docetaxel on 11/06/2018.  The patient was hospitalized from 11/11/2018 to 11/16/2018 for stomatitis and mucositis, with neutropenia.  He also developed hyponatremia and low potassium.  He had a Klebsiella urinary tract infection on admission, and neutrophils as low as ANC 0.1 on 11/13/2018.  His electrolytes recovered by the end of hospitalization and when rechecked a week later.  He is having increased lower back pain, with burning sensations in his feet, and worsening in continence of stool.  Not having any difficulty passing urine.  He has no evidence of blood in urine or  stool.  His appetite is fair, and he is actually gained a couple pounds recently.  He had to take a break from completing his dental procedures, due to his low cell counts from chemotherapy.  He feels he is sleeping well, and has elected to defer the trial of CPAP.  He is swelling in his feet, but not his lower ankles.   The sixth cycle of docetaxel was held, pending the completion of dental work.  Patient was admitted on 01/07/2019 at [facility] with possible sepsis and suspected pneumonia.  He received IV antibiotics.  CT scan of the chest on 12/14/2017 had shown interval progression of osseous metastasis in comparison to CT scan from 12/14/2017.  In addition, the patient had emphysematous changes.  Bone scan on 01/20/2019 shows progression of osseous metastasis.  No pain has progressed somewhat as he has days when he has more pain.  He is taking oxycodone.  The past week, he has been nauseous and vomiting.  Spite the nausea and vomiting, the patient's been able to gain some weight in the past 2 weeks going from 1 55-1 61.  He is not having any difficulty passing urine or stool, and denies significant constipation.  Shortness of breath has not recurred, though he does get winded with little exertion.  No definite fevers or shaking chills, although he feels either warm or chilled.  He has nausea medication at home, but is not tried it as he thought it was just for after chemotherapy.        Oncology History:  Patient was originally diagnosed with prostate cancer in 2006.  He underwent robot-assisted prostatectomy.  He did well until May 2018, when his PSA rose to a high of about 45, and he was started on Lupron, initially with Casodex, and then with apparatus.  His PSA fell and remained low until recently when he was noted to be rising from a low of 0.82 on  01/01/18, to 3.99 on 06/29/2018.  He was instructed to hold Zytiga.  Since holding Zytiga he is noted swelling in his ankles.  The swelling comes on overnight, improves during the course the day.  He was evaluated with Doppler studies on 06/29/2018 which showed no evidence of blood clots.  He is still bothered by the swelling.  Restaging studies done on 06/04/2018 including bone scan, and CT of the chest abdomen pelvis, showed sclerotic changes throughout the axial skeleton, and involvement also of scapula, some ribs and pelvis.  The sites were new since 2016, and also probably new since the CT stone study in May 2018.  Bone scan also showed some progression in disease by report, compared to 2018.  He has developed pain in the back of his calves, for which he takes oxycodone (Tylenol and ibuprofen were ineffective).  The pain followed prolonged episodes of cramping, felt to be related to low magnesium and potassium.  About 6 months ago he had shingles in a left L2 distribution.           Prostate cancer (CMS-HCC)    12/02/2008 Biopsy     Prostate, left apex, needle biopsy  - Adenocarcinoma, Gleason combined score 6 (3+3), 2 of 2 cores involved, tumor  approximately 2 mm in greatest diameter, approximately 10% of tissue involved by  carcinoma.  - See comment    E: ??Prostate, left mid, needle biopsy  - Adenocarcinoma, Gleason combined score 6 (3+3), 1 of 2 cores involved, tumor  approximately 2 mm in greatest diameter, approximately  5% of tissue involved by  carcinoma.  - See comment    F: ??Prostate, left base, needle biopsy  - Adenocarcinoma, Gleason combined score 6 (3+3), 1 of 2 cores involved, tumor  approximately 1 mm in greatest diameter, approximately 5% of tissue involved by  carcinoma.      01/24/2009 Surgery     Prostate, robot assisted laparoscopic prostatectomy  Tumor histologic type: Prostatic adenocarcinoma  Tumor grade (Gleason system): Gleason pattern 7 (3+4)  Approximate percentage of prostate involved by tumor: 40%  Extent of tumor:  Location of tumor involvement in prostate: right and left lobes, anterior  and posterior, apex to base  Confinement/non-confinement of tumor within the prostatic capsule: ??not  confined  Location of extracapsular tumor involvement: ??left prostate toward the base  (ie. block A22)  Type of extracapsular tissue involved within tumor: ?? adipose  tissue/perineural tissue  Angiolymphatic space invasion: not identified  Seminal vesicles: negative  Vas deferens: negative  Histologic assessment of surgical margins: positive margin along a 3 mm  face in the left prostate base (block A25)    Other significant findings: High grade prostatic intraepithelial Neoplasm;  Lymph nodes: Not applicable  AJCC Staging: pT3a pNx      08/30/2013 Initial Diagnosis     Prostate cancer (CMS-HCC)      04/30/2017 -  Chemotherapy     Lupron 22.5 mg; with bicalutamide x  month      08/01/2017 - 06/30/2018 Chemotherapy     Abiaterone 1000 mg daily; decrease to 750 mg daily at 10/2018;      10/30/2017 -  Cancer Staged     Starta: TP53, TMPSSR  -EGR fusion; no other abnormalities identified      01/14/2018 -  Cancer Staged     CT Chest:  Numerous sclerotic metastases of the thoracic spine, left scapula  and multiple ribs.  3. Coronary artery and aortic atherosclerosis (ICD10-I70.0).  4. Previously demonstrated left renal mass is not included in the  field of view.      06/04/2018 -  Cancer Staged     CT CAP:  Scattered diffuse sclerotic metastatic lesions are seen throughout the spine as well as the left scapula; some of the lesions are new since prior CT dated 04/20/2015.  - No intrathoracic metastatic disease.  Interval progression of osseous metastases throughout the spine and pelvis compared to 04/22/2017.  -- Cholelithiasis.  -- Hepatic steatosis.      06/04/2018 -  Cancer Staged     Bone scan:  Increased radiotracer uptake throughout the spine, sacrum, and left ilium, consistent with numerous metastases better evaluated on same-day CT. Unchanged metastasis to the left proximal humerus.      07/14/2018 -  Chemotherapy     OP PROSTATE DOCETAXEL/PREDNISONE  Docetaxel 75 mg/m2 evry 21 days      01/07/2019 -  Cancer Staged     CTA Chest:  No evidence of pulmonary embolism.  2. Occlusion of the medial basilar segmental bronchus of the left  lower lobe, nonspecific though could be seen in the setting of  aspiration. Otherwise, no acute findings.  3. Apical predominant paraseptal emphysematous change with suspected  mild progression of smoking related fibrosis with subpleural  reticulation but without evidence of honeycombing. Emphysema  (ICD10-J43.9).  4. Progression of osseous metastatic disease since 11/2017. Detailed  above. No definitive pathologic compression fractures.  5. Hepatic steatosis. 6. Coronary artery calcifications. Aortic Atherosclerosis      01/20/2019 -  Cancer Staged  NM Bone scan: Redemonstrated multiple abnormal foci throughout the appendicular axial skeleton, with interval increase in the extent of the T10 vertebral body lesion and new lesions involving the manubrium, left fifth and seventh ribs. This is most consistent with progression of osseous metastatic disease.         Past Medical History:   Diagnosis Date   ??? Hx of radiation therapy    ??? Hypertension    ??? Prostate cancer (CMS-HCC)       Past Surgical History:   Procedure Laterality Date   ??? IR INSERT PORT AGE GREATER THAN 5 YRS  07/15/2018    IR INSERT PORT AGE GREATER THAN 5 YRS 07/15/2018 Carolin Coy, MD [facility]   ??? PR COLSC FLX W/RMVL OF TUMOR POLYP LESION SNARE TQ N/A 08/16/2015    Procedure: COLONOSCOPY FLEX; W/REMOV TUMOR/LES BY SNARE;  Surgeon: Gwen Pounds, MD;  Location: [facility];  Service: Gastroenterology   ??? PR COLSC FLX WITH DIRECTED SUBMUCOSAL NJX ANY SBST N/A 08/16/2015    Procedure: COLONOSCOPY, FLEXIBLE, PROXIMAL TO SPLENIC FLEXURE; WITH DIRECTED SUBMUCOSAL INJECTION(S), ANY SUBSTANCE;  Surgeon: Gwen Pounds, MD;  Location: [facility];  Service: Gastroenterology   ??? PROSTATE SURGERY          Family History   Problem Relation Age of Onset   ??? Melanoma Neg Hx    ??? Basal cell carcinoma Neg Hx    ??? Squamous cell carcinoma Neg Hx       No family status information on file.     Social History     Occupational History   ??? Not on file   Tobacco Use   ??? Smoking status: Current Some Day Smoker     Packs/day: 0.30     Years: 21.00     Pack years: 6.30     Types: Cigarettes     Start date: 05/09/1994   ??? Smokeless tobacco: Never Used   ??? Tobacco comment: states only smoke about 3  aday   Substance and Sexual Activity   ??? Alcohol use: Yes     Alcohol/week: 2.0 standard drinks Types: 2 Cans of beer per week   ??? Drug use: No   ??? Sexual activity: Not on file       Allergies   Allergen Reactions   ??? Penicillins      Other reaction(s): UNKNOWN   ??? Tramadol Dizziness       Current Outpatient Medications   Medication Sig Dispense Refill   ??? albuterol HFA 90 mcg/actuation inhaler Inhale 2 puffs every six (6) hours as needed for wheezing. 1 Inhaler 2   ??? busPIRone (BUSPAR) 5 MG tablet Take 1 tablet (5 mg total) by mouth Three (3) times a day. 270 tablet 3   ??? lidocaine-diphenhydramine-aluminum-magnesium (FIRST-MOUTHWASH BLM) 200-25-400-40 mg/30 mL Mwsh 10 mL by Mouth route 4 (four) times a day as needed. 1200 mL 0   ??? magnesium oxide (MAG-OX) 400 mg (241.3 mg magnesium) tablet Take 1 tablet (400 mg total) by mouth Two (2) times a day. 180 tablet 3   ??? omeprazole (PRILOSEC) 20 MG capsule take 2 capsule by mouth once daily 180 capsule 3   ??? oxyCODONE (ROXICODONE) 5 MG immediate release tablet Take 1 tablet (5 mg total) by mouth every six (6) hours as needed for pain. 100 tablet 0   ??? pancrelipase, Lip-Prot-Amyl, (CREON) 24,000-76,000 -120,000 unit CpDR delayed release capsule Take 48,000 units of lipase by  mouth Three (3) times a day with a meal. Take an additional 1 capsule with snacks.     ??? potassium chloride SA (KLOR-CON) 20 MEQ tablet Take 3 tablets (60 mEq total) by mouth Two (2) times a day. 90 tablet 3   ??? tiotropium (SPIRIVA WITH HANDIHALER) 18 mcg inhalation capsule Place 1 capsule (18 mcg total) into inhaler and inhale once daily. 90 capsule 3   ??? traZODone (DESYREL) 50 MG tablet Take 1 tablet (50 mg total) by mouth nightly as needed for sleep. 90 tablet 3   ??? valACYclovir (VALTREX) 500 MG tablet Take 1 tablet (500 mg total) by mouth daily. 90 tablet 3   ??? amLODIPine (NORVASC) 10 MG tablet Take 1 tablet (10 mg total) by mouth daily. 30 tablet 11   ??? diphenhydramine HCl (MAGIC MOUTHWASH ORAL) suspension Take 10 mL by mouth 4 (four) times a day as needed. (Patient not taking: Reported on 02/02/2019) 120 mL 1   ??? enzalutamide (XTANDI) 40 mg capsule Take 4 capsules (160 mg total) by mouth daily. 120 capsule 11   ??? prochlorperazine (COMPAZINE) 10 MG tablet Take 1 tablet (10 mg total) by mouth every eight (8) hours as needed. 30 tablet 11     No current facility-administered medications for this visit.        The following portions of the patient's history were reviewed and updated as appropriate: allergies, current medications, past family history, past medical history, past social history, past surgical history and problem list.     Review of Systems   Constitutional: Positive for fatigue. Negative for appetite change, chills, diaphoresis, fever and unexpected weight change.   HENT:   Negative for mouth sores, nosebleeds and trouble swallowing.    Eyes: Negative for eye problems.   Respiratory: Negative for chest tightness, cough, hemoptysis and shortness of breath.    Cardiovascular: Negative for chest pain and leg swelling.   Gastrointestinal: Negative for abdominal pain, blood in stool, constipation, diarrhea and nausea.   Genitourinary: Negative for difficulty urinating and hematuria.    Musculoskeletal: Positive for back pain and myalgias. Negative for arthralgias.   Skin: Negative for rash.   Neurological: Negative for extremity weakness, headaches and seizures.   Hematological: Negative for adenopathy.   Psychiatric/Behavioral: Positive for depression. Negative for confusion.   All other systems reviewed and are negative.        Vital Signs for this encounter:  BSA: 1.87 meters squared  BP 180/104  - Pulse 88  - Temp 36.6 ??C (97.9 ??F) (Temporal)  - Resp 18  - Wt 73.1 kg (161 lb 1.6 oz)  - SpO2 99%  - BMI 24.50 kg/m??     Physical Exam   Constitutional: He is oriented to person, place, and time.   Ill-appearing African-American gentleman, some swelling in the lower face, in no acute distress.   HENT:   Many teeth missing, remaining teeth with significant gum disease.   Neck: No JVD present. No thyromegaly present.   Cardiovascular: Normal rate, regular rhythm and normal heart sounds.   Pulmonary/Chest: Effort normal and breath sounds normal.   Abdominal: Bowel sounds are normal. He exhibits no distension and no mass. There is no abdominal tenderness.   Musculoskeletal:         General: No tenderness.      Comments: There is trace  edema in bilateral feet and ankles.   Lymphadenopathy:     He has no cervical adenopathy.   No adenopathy neck, supraclavicular, axillary  inguinal regions.   Neurological: He is alert and oriented to person, place, and time.   Skin: No rash noted.   Psychiatric: He has a normal mood and affect. His behavior is normal.       Karnofsky/Lansky Performance Status  80, Normal activity with effort; some signs or symptoms of disease (ECOG equivalent 1)    Results:    WBC   Date Value Ref Range Status   02/02/2019 7.9 4.5 - 11.0 10*9/L Final   04/17/2012 7.6 4.5 - 11.0 x10 9th/L Final     HGB   Date Value Ref Range Status   02/02/2019 11.1 (L) 13.5 - 17.5 g/dL Final   16/09/9603 54.0 13.5 - 17.5 G/DL Final     HCT   Date Value Ref Range Status   02/02/2019 31.6 (L) 41.0 - 53.0 % Final   04/17/2012 39.2 (L) 41.0 - 53.0 % Final     Platelet   Date Value Ref Range Status   02/02/2019 241 150 - 440 10*9/L Final   04/17/2012 242 150 - 440 x10 9th/L Final     Creatinine Whole Blood, POC   Date Value Ref Range Status   07/25/2014 1.6 (H) 0.8 - 1.4 mg/dL Final     Creatinine, Whole Blood   Date Value Ref Range Status   07/25/2014 1.4 0.8 - 1.4 mg/dL Final     Creatinine   Date Value Ref Range Status   02/02/2019 0.84 0.70 - 1.30 mg/dL Final   98/10/9146 8.29 0.70 - 1.30 mg/dL Final     AST   Date Value Ref Range Status   02/02/2019 56 (H) 19 - 55 U/L Final   09/11/2011 38 19 - 55 U/L Final     PSA Diagnostic   Date Value Ref Range Status   09/28/2014 4.4 (H) 0.0 - 4.0 ng/mL Final   05/30/2014 5.0 (H) 0.0 - 4.0 ng/mL Final   03/23/2014 4.6 (H) 0.0 - 4.0 ng/mL Final   10/04/2013 3.7 0.0 - 4.0 ng/mL Final   06/24/2013 2.6 0.0 - 4.0 ng/mL Final   01/18/2013 1.6 0.0 - 4.0 NG/ML Final   09/28/2012 1.5 0.0 - 4.0 NG/ML Final   07/03/2012 1.0 0.0 - 4.0 NG/ML Final   12/30/2011 0.5 0.0 - 4.0 NG/ML Final   07/15/2011 0.3 0.0 - 4.0 NG/ML Final   04/08/2011 0.3 0.0 - 4.0 NG/ML Final

## 2019-02-02 ENCOUNTER — Ambulatory Visit
Admit: 2019-02-02 | Discharge: 2019-02-03 | Payer: MEDICAID | Attending: Hematology & Oncology | Primary: Hematology & Oncology

## 2019-02-02 DIAGNOSIS — C61 Malignant neoplasm of prostate: Principal | ICD-10-CM

## 2019-02-02 LAB — CBC W/ AUTO DIFF
BASOPHILS ABSOLUTE COUNT: 0 10*9/L (ref 0.0–0.1)
BASOPHILS RELATIVE PERCENT: 0.5 %
EOSINOPHILS ABSOLUTE COUNT: 0.2 10*9/L (ref 0.0–0.4)
EOSINOPHILS RELATIVE PERCENT: 2.2 %
HEMATOCRIT: 31.6 % — ABNORMAL LOW (ref 41.0–53.0)
LARGE UNSTAINED CELLS: 2 % (ref 0–4)
LYMPHOCYTES ABSOLUTE COUNT: 1.3 10*9/L — ABNORMAL LOW (ref 1.5–5.0)
LYMPHOCYTES RELATIVE PERCENT: 15.8 %
MEAN CORPUSCULAR HEMOGLOBIN CONC: 35.2 g/dL (ref 31.0–37.0)
MEAN CORPUSCULAR HEMOGLOBIN: 35.5 pg — ABNORMAL HIGH (ref 26.0–34.0)
MEAN CORPUSCULAR VOLUME: 100.7 fL — ABNORMAL HIGH (ref 80.0–100.0)
MEAN PLATELET VOLUME: 7 fL (ref 7.0–10.0)
MONOCYTES RELATIVE PERCENT: 6 %
NEUTROPHILS ABSOLUTE COUNT: 5.8 10*9/L (ref 2.0–7.5)
NEUTROPHILS RELATIVE PERCENT: 73.4 %
PLATELET COUNT: 241 10*9/L (ref 150–440)
RED BLOOD CELL COUNT: 3.14 10*12/L — ABNORMAL LOW (ref 4.50–5.90)
RED CELL DISTRIBUTION WIDTH: 14.6 % (ref 12.0–15.0)
WBC ADJUSTED: 7.9 10*9/L (ref 4.5–11.0)

## 2019-02-02 LAB — COMPREHENSIVE METABOLIC PANEL
ALBUMIN: 3.8 g/dL (ref 3.5–5.0)
ALKALINE PHOSPHATASE: 124 U/L (ref 38–126)
ALT (SGPT): 17 U/L (ref <50)
ANION GAP: 10 mmol/L (ref 7–15)
BILIRUBIN TOTAL: 0.6 mg/dL (ref 0.0–1.2)
BLOOD UREA NITROGEN: 12 mg/dL (ref 7–21)
BUN / CREAT RATIO: 14
CALCIUM: 9.5 mg/dL (ref 8.5–10.2)
CHLORIDE: 111 mmol/L — ABNORMAL HIGH (ref 98–107)
CO2: 20 mmol/L — ABNORMAL LOW (ref 22.0–30.0)
CREATININE: 0.84 mg/dL (ref 0.70–1.30)
EGFR CKD-EPI AA MALE: >90 mL/min/{1.73_m2} (ref >=60)
GLUCOSE RANDOM: 107 mg/dL (ref 65–179)
POTASSIUM: 4 mmol/L (ref 3.5–5.0)
PROTEIN TOTAL: 7.1 g/dL (ref 6.5–8.3)
SODIUM: 141 mmol/L (ref 135–145)

## 2019-02-02 LAB — LIPASE: Triacylglycerol lipase:CCnc:Pt:Ser/Plas:Qn:: 128

## 2019-02-02 LAB — MONOCYTES ABSOLUTE COUNT: Lab: 0.5

## 2019-02-02 LAB — ALBUMIN: Albumin:MCnc:Pt:Ser/Plas:Qn:: 3.8

## 2019-02-02 LAB — PROSTATE SPECIFIC ANTIGEN: Prostate specific Ag:MCnc:Pt:Ser/Plas:Qn:: 14.3 — ABNORMAL HIGH

## 2019-02-02 MED ORDER — PROCHLORPERAZINE MALEATE 10 MG TABLET
ORAL_TABLET | Freq: Three times a day (TID) | ORAL | 11 refills | 0 days | PRN
Start: 2019-02-02 — End: 2019-06-14

## 2019-02-02 MED ORDER — AMLODIPINE 10 MG TABLET
ORAL_TABLET | Freq: Every day | ORAL | 11 refills | 0.00000 days | Status: CP
Start: 2019-02-02 — End: 2020-02-02

## 2019-02-02 MED ORDER — XTANDI 40 MG CAPSULE
ORAL_CAPSULE | Freq: Every day | ORAL | 11 refills | 0.00000 days | Status: CP
Start: 2019-02-02 — End: 2019-03-19
  Filled 2019-02-03: qty 120, 30d supply, fill #0

## 2019-02-02 NOTE — Unmapped (Signed)
Addended by: Duffy Rhody F on: 02/02/2019 10:35 AM     Modules accepted: Orders

## 2019-02-02 NOTE — Unmapped (Addendum)
[name] here for followup with Dr. Claude Manges.    Weight: Fluctuating - gained 6lbs since last visit  Energy Level/Fatigue/Active Level: Energy level not good - fatigued, weak  Elimination/Discharge: No issues reported today  Nausea/Vomitting: Nausea reported every Monday - pt not taking anything right now  Appetite/Diet: Fair - comes and goes  Pain/Swelling: No pain right now  Skin: Intact    Chemo: On hold  Psychosocial: Pt states brother past away this past week - this is the brother that use to bring him here to his appts  Refills of Meds: Trazodone    Pt has not taken his BP medications in over a month - stated one of his doctors told him to stop - pt reports several headaches over the past week - BP elevated today 198/96 and recheck was 180/104.     Jan removed PIV.    Notified Dr. Claude Manges.

## 2019-02-02 NOTE — Unmapped (Signed)
[facility]   Patient Onboarding/Medication Counseling    [name] is a 58 y.o. male with metastatic prostate cancer who I am counseling today on initiation of therapy.    Medication: Xtandi 40mg     Verified patient's date of birth / HIPAA.      Education Provided: ?    Dose/Administration discussed: Take 4 capsules by mouth daily. This medication should be taken  without regard to food.  Stressed the importance of taking medication as prescribed and to contact provider if that changes at any time.  Discussed missed dose instructions.    Storage requirements: this medicine should be stored at room temperature.     Side effects / precautions discussed: Discussed common side effects, including but not limited to feeling tired/weak, diarrhea or constipation, dizziness, muscle pain/weakness, weight loss, etc. If patient experiences signs of allergic reaction, s/sx of high blood pressure, chest pain/pressure, shortness of breath, burning/numbness/tingling sensations, memory problems, etc., they need to call the doctor.  Patient will receive a drug information handout with shipment.    Handling precautions / disposal reviewed:  Patient was counseled on the hazards surrounding oral chemotherapy and will minimize contact/exposure to the medicine.    Drug Interactions: other medications reviewed and up to date in Epic.  No drug interactions identified.    Comorbidities/Allergies: reviewed and up to date in Epic.    Verified therapy is appropriate and should continue      Delivery Information    Medication Assistance provided: Prior Authorization    Anticipated copay of $3 reviewed with patient. Verified delivery address in Epic.    Scheduled delivery date: 02/04/2019  Medication will be delivered via Next Day Courier to the home address in [place].  This shipment will not require a signature.      Explained the services we provide at [facility] and that each month we would call to set up refills.  Stressed importance of returning phone calls so that we could ensure they receive their medications in time each month.  Informed patient that we should be setting up refills 7-10 days prior to when they will run out of medication.  Informed patient that welcome packet will be sent.      Patient verbalized understanding of the above information as well as how to contact the pharmacy at [phone] option 4 with any questions/concerns.  The pharmacy is open Monday through Friday 8:30am-4:30pm.  A pharmacist is available 24/7 via pager to answer any clinical questions they may have.        Patient Specific Needs      ? Patient has no physical, cognitive, or cultural barriers.    ? Patient prefers to have medications discussed with  Patient     ? Patient is able to read and understand education materials at a high school level or above.    ? Patient's primary language is  English           Lupita Shutter  [facility] Specialty Pharmacist

## 2019-02-02 NOTE — Unmapped (Signed)
The Internal Medicine Enhanced Care team was unable to reach Joseph Phillips to follow-up in regards to their recent visit with our Hospital Follow-up Team. CA also planned to discuss mood. No voicemail was available to leave message. This is our first attempt to contact patient.

## 2019-02-02 NOTE — Unmapped (Addendum)
--Please have your dental problems attended to as quickly as possible.  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  --We will start on a drug called Xtandi (enzalutamide).  You take 4 capsules once daily.  See the information below for more details about this drug.  --Please restart amlodipine 10 mg daily.  If you can monitor your blood pressures at home do so.  If your resting blood sugar is significantly above 120/70, we may want to add additional agents.  --Try taking Compazine (prochlorperazine) 10 mg every 6 hours for nausea.  Let me know if this medication is not helping the nausea.  For example, Compazine will not help nausea due to obstruction of the stomach, or problems with the bowels.  --Plan to see you back on March 9, for Lupron shot and follow-up.  If your dental needs have been taking care of, we will also be starting you on a medication called Rivka Barbara  --If you continue to have difficulty with nausea, or any other problems, do not hesitate to contact us.  The clinic phone number is [phone] or send a message using the email feature of My [facility] Chart.      Patient Education        enzalutamide  Pronunciation:  ENZ a LOOT a mide  Brand:  Xtandi  What is the most important information I should know about enzalutamide?  Although not for use by women, enzalutamide can cause birth defects if the mother or the father is taking this medicine. Use a condom and one other form of birth control to prevent pregnancy while using this medicine, and for at least 3 months after your last dose.  What is enzalutamide?  Enzalutamide is an anti-androgen. It works in the body by preventing the actions of androgens (male hormones).  Enzalutamide is used to treat prostate cancer. Enzalutamide is used together with other medicines, or with surgery.  Enzalutamide may also be used for purposes not listed in this medication guide.  What should I discuss with my healthcare provider before taking enzalutamide?  You should not use enzalutamide if you are allergic to it.  Tell your doctor if you have ever had:  ?? a seizure;  ?? a head injury, stroke, or brain tumor;  ?? heart disease;  ?? high blood pressure; or  ?? high cholesterol or triglycerides (a type of fat in the blood).  Enzalutamide can harm an unborn baby or cause birth defects, even if the father is taking this medicine.  ?? If you are taking enzalutamide and your sex partner could become pregnant, use effective birth control to prevent pregnancy. Keep using birth control for at least 3 months after your last dose.  ?? Tell your doctor at once if a pregnancy occurs while you are taking enzalutamide.  Enzalutamide capsules should not be handled by a woman who is pregnant or who may become pregnant.  Although this medicine is not for use by women, enzalutamide should not be taken by a woman who is breast-feeding a baby.  How should I take enzalutamide?  Follow all directions on your prescription label and read all medication guides or instruction sheets. Use the medicine exactly as directed.  Take the medicine at the same time each day, with or without food.  Swallow the capsule whole and do not chew, open, or dissolve it.  Use all medications as directed and read all medication  guides you receive. Do not change your dose or dosing schedule without your doctor's advice.  Store at room temperature away from moisture and heat. Keep the bottle tightly closed when not in use. Do not allow another person to handle your medicine.  What happens if I miss a dose?  Take the missed dose as soon as you remember. If you forget your dose for the entire day, skip the missed dose and go back to your regular schedule the next day. Do not take two doses in one day.  What happens if I overdose?  Seek emergency medical attention or call the Poison Help line at [phone]. An overdose could cause you to have a seizure. What should I avoid while taking enzalutamide?  This medication can make you dizzy, and may cause you to have a seizure or suddenly become unconscious. Be careful if you drive or do anything that requires you to be alert. Severe dizziness can cause falls or other accidents.  Even without dizziness, taking enzalutamide could increase your risk of falls or bone fractures. Avoid activities or situations that may lead to injury or falls.  This medicine can pass into body fluids (urine, feces, vomit). Caregivers should wear rubber gloves while cleaning up a patient's body fluids, handling contaminated trash or laundry or changing diapers. Wash hands before and after removing gloves. Wash soiled clothing and linens separately from other laundry.  What are the possible side effects of enzalutamide?  Get emergency medical help if you have signs of an allergic reaction: hives; difficult breathing; swelling of your face, lips, tongue, or throat.  Stop using enzalutamide and call your doctor at once if you have:  ?? a seizure (black-out or convulsions);  ?? confusion, thinking problems, severe headache, buzzing in your ears, vision problems;  ?? weakness, loss of consciousness;  ?? red or pink urine;  ?? heart problems --chest pain, shortness of breath (even with mild exertion);  ?? increased blood pressure --severe headache, blurred vision, pounding in your neck or ears, anxiety, nosebleed; or  ?? signs of a lung infection --fever, cough with yellow or green mucus, stabbing chest pain, wheezing, feeling short of breath.  Your cancer treatments may be delayed or permanently discontinued if you have certain side effects.  Common side effects may include:  ?? headache, dizziness, spinning sensation;  ?? feeling weak or tired;  ?? loss of appetite, weight loss;  ?? flushing (redness, hot feeling);  ?? joint pain; or  ?? high blood pressure.  This is not a complete list of side effects and others may occur. Call your doctor for medical advice about side effects. You may report side effects to FDA at 1-800-FDA-1088.  What other drugs will affect enzalutamide?  Enzalutamide can increase your risk of having a seizure, especially if you also use certain other medicines for infections, swelling or inflammation, asthma, hormone replacement, diabetes, depression, or mental illness.  Many drugs can affect enzalutamide. This includes prescription and over-the-counter medicines, vitamins, and herbal products. Not all possible interactions are listed here. Tell your doctor about all your current medicines and any medicine you start or stop using.  Where can I get more information?  Your pharmacist can provide more information about enzalutamide.  Remember, keep this and all other medicines out of the reach of children, never share your medicines with others, and use this medication only for the indication prescribed.  Every effort has been made to ensure that the information provided by [employer] ('[employer]')  is accurate, up-to-date, and complete, but no guarantee is made to that effect. Drug information contained herein may be time sensitive. [employer] information has been compiled for use by healthcare practitioners and consumers in the [place] and therefore [employer] does not warrant that uses outside of the [place] are appropriate, unless specifically indicated otherwise. [employer]'s drug information does not endorse drugs, diagnose patients or recommend therapy. [employer]'s drug information is an Investment [employer], corporate to assist licensed healthcare practitioners in caring for their patients and/or to serve consumers viewing this service as a supplement to, and not a substitute for, the expertise, skill, knowledge and judgment of healthcare practitioners. The absence of a warning for a given drug or drug combination in no way should be construed to indicate that the drug or drug combination is safe, effective or appropriate for any given patient. Mult[employer] does not assume any responsibility for any aspect of healthcare administered with the aid of information [employer] provides. The information contained herein is not intended to cover all possible uses, directions, precautions, warnings, drug interactions, allergic reactions, or adverse effects. If you have questions about the drugs you are taking, check with your doctor, nurse or pharmacist.  Copyright [phone] [employer] Version: 3.01. Revision date: 08/26/2017.  Care instructions adapted under license by [facility]. If you have questions about a medical condition or this instruction, always ask your healthcare professional. Healthwise, Incorporated disclaims any warranty or liability for your use of this information.       Patient Education        Low Back Pain: Exercises  Introduction  Here are some examples of exercises for you to try. The exercises may be suggested for a condition or for rehabilitation. Start each exercise slowly. Ease off the exercises if you start to have pain.  You will be told when to start these exercises and which ones will work best for you.  How to do the exercises  Press-up   1. Lie on your stomach, supporting your body with your forearms.  2. Press your elbows down into the floor to raise your upper back. As you do this, relax your stomach muscles and allow your back to arch without using your back muscles. As your press up, do not let your hips or pelvis come off the floor.  3. Hold for 15 to 30 seconds, then relax.  4. Repeat 2 to 4 times.    Alternate arm and leg (bird dog) exercise   1. Start on the floor, on your hands and knees.  2. Tighten your belly muscles.  3. Raise one leg off the floor, and hold it straight out behind you. Be careful not to let your hip drop down, because that will twist your trunk.  4. Hold for about 6 seconds, then lower your leg and switch to the other leg.  5. Repeat 8 to 12 times on each leg.  6. Over time, work up to holding for 10 to 30 seconds each time.  7. If you feel stable and secure with your leg raised, try raising the opposite arm straight out in front of you at the same time.    Knee-to-chest exercise   1. Lie on your back with your knees bent and your feet flat on the floor.  2. Bring one knee to your chest, keeping the other foot flat on the floor (or keeping the other leg straight, whichever feels better on your lower back).  3. Keep your lower back  pressed to the floor. Hold for at least 15 to 30 seconds.  4. Relax, and lower the knee to the starting position.  5. Repeat with the other leg. Repeat 2 to 4 times with each leg.  6. To get more stretch, put your other leg flat on the floor while pulling your knee to your chest.    Curl-ups   1. Lie on the floor on your back with your knees bent at a 90-degree angle. Your feet should be flat on the floor, about 12 inches from your buttocks.  2. Cross your arms over your chest. If this bothers your neck, try putting your hands behind your neck (not your head), with your elbows spread apart.  3. Slowly tighten your belly muscles and raise your shoulder blades off the floor.  4. Keep your head in line with your body, and do not press your chin to your chest.  5. Hold this position for 1 or 2 seconds, then slowly lower yourself back down to the floor.  6. Repeat 8 to 12 times.    Pelvic tilt exercise   1. Lie on your back with your knees bent.  2. Brace your stomach. This means to tighten your muscles by pulling in and imagining your belly button moving toward your spine. You should feel like your back is pressing to the floor and your hips and pelvis are rocking back.  3. Hold for about 6 seconds while you breathe smoothly.  4. Repeat 8 to 12 times.    Heel dig bridging   1. Lie on your back with both knees bent and your ankles bent so that only your heels are digging into the floor. Your knees should be bent about 90 degrees.  2. Then push your heels into the floor, squeeze your buttocks, and lift your hips off the floor until your shoulders, hips, and knees are all in a straight line.  3. Hold for about 6 seconds as you continue to breathe normally, and then slowly lower your hips back down to the floor and rest for up to 10 seconds.  4. Do 8 to 12 repetitions.    Hamstring stretch in doorway   1. Lie on your back in a doorway, with one leg through the open door.  2. Slide your leg up the wall to straighten your knee. You should feel a gentle stretch down the back of your leg.  3. Hold the stretch for at least 15 to 30 seconds. Do not arch your back, point your toes, or bend either knee. Keep one heel touching the floor and the other heel touching the wall.  4. Repeat with your other leg.  5. Do 2 to 4 times for each leg.    Hip flexor stretch   1. Kneel on the floor with one knee bent and one leg behind you. Place your forward knee over your foot. Keep your other knee touching the floor.  2. Slowly push your hips forward until you feel a stretch in the upper thigh of your rear leg.  3. Hold the stretch for at least 15 to 30 seconds. Repeat with your other leg.  4. Do 2 to 4 times on each side.    Wall sit   1. Stand with your back 10 to 12 inches away from a wall.  2. Lean into the wall until your back is flat against it.  3. Slowly slide down until your knees are slightly bent, pressing your lower  back into the wall.  4. Hold for about 6 seconds, then slide back up the wall.  5. Repeat 8 to 12 times.    Follow-up care is a key part of your treatment and safety. Be sure to make and go to all appointments, and call your doctor if you are having problems. It's also a good idea to know your test results and keep a list of the medicines you take.  Where can you learn more?  Go to [facility] at [email or web address]  [facility] under [employer]. Enter [phone] in the search box to learn more about Low Back Pain: Exercises.  Current as of: June 17, 2018  Content Version: 12.3 ?? 2006-2019 Healthwise, Incorporated. Care instructions adapted under license by [facility]. If you have questions about a medical condition or this instruction, always ask your healthcare professional. Healthwise, Incorporated disclaims any warranty or liability for your use of this information.

## 2019-02-02 NOTE — Unmapped (Signed)
[facility] Specialty Medication Referral: No PA required    Medication (Brand/Generic): XTANDI 40 MG CAPSULE    Initial Benefits Investigation Claim completed with resulted information below:  No PA required  Patient ABLE to fill at [facility]  Insurance Company:  MEDICAID  Anticipated Copay: $3.00    PT OPTED OUT IN THE PAST WITH OTHER ONCOLOGY MEDICATIONS     As Co-pay is under $25 defined limit, per policy there will be no further investigation of need for financial assistance at this time unless patient requests. This referral has been communicated to the provider and handed off to the [facility] team for further processing and filling of prescribed medication.   ______________________________________________________________________  Please utilize this referral for viewing purposes as it will serve as the central location for all relevant documentation and updates.

## 2019-02-02 NOTE — Unmapped (Signed)
Accessed patient's port with a 20g 3/4 inch needle.  Obtained labs and flushed port with 20ml of normal saline and 5ml of heparin. Patient's port was deaccessed prior to leaving clinic.

## 2019-02-03 MED FILL — XTANDI 40 MG CAPSULE: 30 days supply | Qty: 120 | Fill #0 | Status: AC

## 2019-02-03 NOTE — Unmapped (Signed)
Message Title: Transitions of Care and Depression Phone Call Follow-Up  Transitions Phone Call Follow-Up: Week # 2 after hospital f/u clinic visit   Person spoken with: Joseph Phillips    Conditions and Symptoms  New problems: Patient reported following closely with Oncology.   Medications: Patient saw Oncology yesterday and reported they made a few changes to his medications.     Upcoming appointments: 03/01/19 with Oncology. Patient preferred to wait to schedule with PCP.     Comments: Patient reported that his brother recently passed away. He reported experiencing some mood swings. He is trying to take one step at a time. Informed patient that support groups and programs were available for cancer patients. Patient expressed understanding and has considered them, though he feels now is not the time. He will reach out to them when he is ready.     Provided the patient with the following information:  -  Same Day Clinic:  [phone].  8am-5pm M-F.   -  After Hours:  [phone].  A nurse will answer and can help you decide what kind of medication attention you need.  A doctor is also there to help  [facility]:  [phone].  If you are sick, but not injured:  9am-8pm Mon-Sun.  [address], [place], [place] off of [address].  All walk-ins accepted.    [facility] at [facility]:  [phone].  7am-9pm Mon-Fri; 12pm-5pm [address]  [address], [place] [address].  All walk-ins accepted.

## 2019-02-09 NOTE — Unmapped (Signed)
Hi,     Joseph Phillips contacted the [employer] requesting to speak with the care team of Joseph Phillips to discuss:    Patient has completed first round of dental work. Wants to know what the next step in his plan of care will be and if he should begin medication regimen.    Please contact [name] at [phone].    Check Indicates criteria has been reviewed and confirmed with the patient:    []  Preferred Name   []  DOB and/or MR#  []  Preferred Contact Method  []  Phone Number(s)   []  MyChart     Thank you,   Kelli Hope  [facility]   [phone]

## 2019-02-11 NOTE — Unmapped (Addendum)
I have contacted the patient and clarified the following:    ?? Patient is indicated that he is seeing Dr. Micheal Likens, DDS in [place], [place] [phone]) and continues to require needed dental work.    ?? I have contacted Dr. Jannette Spanner office and verified the following:  ?? The patient requires the extraction of 2 teeth.  ?? Once the extraction is complete, a set of partial dentures will then be used.  ?? The prior authorization request for the tooth extraction and partial denture have been sent to his insurance company for review.  ?? Thus, the tooth extraction an appointment for the partial denture have not yet been scheduled.    ?? It is Dr. Leota Jacobsen recommendation to [facility] on administering the denosumab until all dental work is resolved.    ?? In addition, Victorino Dike (a Futures trader with [facility]; [phone]) called on behalf of the patient requesting a consultation from a member of the nutrition team.  ?? This message has been routed to [facility], RD/LDN for review.    ?? Patient remains tentatively scheduled for follow-up clinic visit with Dr. Claude Manges, lab work and injection of leuprolide on Monday, March 01, 2019.    No other actions taken at this time and Dr. Claude Manges is aware.

## 2019-02-15 NOTE — Unmapped (Signed)
Nutrition Note    Pt is a 58 y.o. with Stage IV Prostate Cancer treated in [place].  Recived message that pt would like to speak with RD again (first spoke 12/23).  Called pt several times with no answer and his voicemail is not set up to leave a message.    Neila Gear MS, RD, CSO, LDN

## 2019-02-15 NOTE — Unmapped (Signed)
Please see encounter notes dated 02/11/2017 specific to the note outlining plan of care with regard to his treatment and the need for dental work.

## 2019-02-15 NOTE — Unmapped (Signed)
Hi,     Patient contacted the Communication Center requesting to speak with the care team of Wilburn Mylar to discuss:    Stated that his dental staff would like to speak with Dr. Claude Manges in regards to patient's dental care and possibly extracting some teeth. Dr. Rolly Pancake can be reached at [phone]    Please contact [facility] at [phone].      Check Indicates criteria has been reviewed and confirmed with the patient:    [x]  Preferred Name   [x]  DOB and/or MR#  [x]  Preferred Contact Method  [x]  Phone Number(s)   []  MyChart     Thank you,   Jannette Spanner  [facility]   [phone]

## 2019-02-16 NOTE — Unmapped (Signed)
Hi,     Wilburn Mylar has contacted the Communication Center in regards to the following symptom:     Pain: worsening  (patient states oxycodone 5 mg tablets not effective)    Please contact Wilburn Mylar at [phone] or his brother [name] at [phone].    Check Indicates criteria has been reviewed and confirmed with the patient:    []  Preferred Name   []  DOB and/or MR#  []  Preferred Contact Method  []  Phone Number(s)   []  MyChart     A page or telephone call has been made to the corresponding clinic.     Thank you,  Kelli Hope   [facility]   [phone]

## 2019-02-17 NOTE — Unmapped (Signed)
Hi,     Patient contacted the Communication Center requesting to speak with the care team of Joseph Phillips to discuss:    Calling to follow up on call placed yesterday about pain meds. Patient stated he never received a call back    Please contact Joseph Phillips at [phone]      Check Indicates criteria has been reviewed and confirmed with the patient:    [x]  Preferred Name   [x]  DOB and/or MR#  [x]  Preferred Contact Method  [x]  Phone Number(s)   []  MyChart     Thank you,   Jannette Spanner  [facility]   [phone]

## 2019-02-18 NOTE — Unmapped (Signed)
Our office has received notification from Dr. Pollie Friar regarding the following:    ?? The patient has received APPROVAL to proceed with the proposed tooth extraction and fitting of a partial denture.    ?? A correspondence has been sent via facsimile notifying Dr. Pollie Friar and her team that the patient is to [facility] initiating treatment with denosumab until released/fully recovered from his upcoming dental procedures.  ?? The original version of the sent correspondence is archived in the media section of the electronic medical record.    No other actions taken at this time.

## 2019-02-19 NOTE — Unmapped (Signed)
As directed by Dr. Claude Manges, I have contacted the patient to review with him the following:    ?? With regards to inadequate pain control, Dr. Claude Manges has recommended the patient change his oxycodone dosing as follows:  ?? Originally prescribed oxycodone 5 mg tablets to take 1 tablet every 6 hours as needed for pain (no more than 4 in a 24-hour period).  ?? New instruction is to take 2 oxycodone 5 mg tablets every 6 hours as needed for pain (no more than 8 in a 24-hour period).    ?? Follow-up clinic visit with Dr. Claude Manges remains tentatively scheduled for Monday, March 01, 2019 to review the plan of care.    Otherwise, the patient expressed verbal understanding and had no other questions or concerns at this time and is agreeable to our plan.  He is to call the clinic back sooner should he have any other issues.    No other actions taken in Dr. Claude Manges is aware.

## 2019-02-24 NOTE — Unmapped (Signed)
Our office is received notification from the patient's treating dental provider, Dr. Pollie Friar regarding the following:    ?? The patient has successfully completed tooth extraction x2 on 02/23/2019.    ?? The patient is tentatively set for his initial dose of denosumab on 03/01/2019 which will need to be HELD at this time until his in progress dental work is resolved.    This information has been shared with Dr. Claude Manges and no other immediate actions are indicated at this time.

## 2019-02-28 NOTE — Unmapped (Signed)
Initial Visit Note    Patient Name: Joseph Phillips  Patient Age: 58 y.o.  Encounter Date: 03/01/2019    Referring Physician:     Towanda Malkin, MD  [address]  [address] [facility] [phone]  [place], [place] [address]    Primary Care Provider:  Unknown Foley, MD    Assessment/Plan:    Reason for visit:  Prostate Cancer    Cancer Staging  Prostate cancer (CMS-HCC)  Staging form: Prostate, AJCC 7th Edition  - Clinical: Stage IV (T3a, N0, M1b, PSA: Less than 10, Gleason 7) - Unsigned      --Prostate cancer, stage IV, with progression on Lupron/Zytiga     --07/14/18: Docetaxel/prednisone     --11/11/2018: Neutropenic fever, with possible UTI and oral mucositis     -- 02/03/19: [name];    --Hypokalemia  --Hypomagnesemia  --Leg cramps and pain, exacerbated by electrolyte disturbances      --03/01/19: Skin changes in the lower extremities of uncertain etiology.  --Teeth in poor repair, awaiting dental correction  --Hypertension  --Lower extremity swelling, exacerbated at night; improved on triamterene-HCTZ  -- OSA; positive study for both central and obstructive apnea; CPAP trial recommended, 07/23/2018; deferred by the patient  --History of pancreatitis with pancreatic insufficiency; on Creon      Recommendation  --The patient has some evidence of progressive disease, with increased bone pain, elevated ALP, and changes in the skin lower extremities of uncertain etiology may be secondary to [facility], may have other causes such as cellulitis.  --Patient will hold Xtandi for the next 3 to 7 days.  He will contact us if there is a marked improvement in his condition.  He will go on doxycycline 100 mg twice daily.  --He received Lupron and Xgeva today.  We will see him back in 1 week to assess status, and consider the need for additional studies.  --Magnesium levels pending; he will hold magnesium while taking doxycycline.  --I have asked patient to hold the use of nicotine products including smoking, toes clearing skin changes lower extremities.        I have reviewed the laboratory, pathology, and radiology reports in detail and discussed findings with the patient    History of Present Illness:     Joseph Phillips is a 58 y.o. male who is seen in consultation at the request of Lonn Georgia* for an evaluation of Prostate Cancer.  Patient returns after being on Xtandi for the past month.  He started having pain in his shoulders about a month ago, and dusky tender skin with a little bit of swelling in the right lower extremity about 3 weeks ago, and also in the left lower extremity for the past 1 to 2 weeks.  He continues to smoke, but not very much.  He has had his dental work finished and has clearance for [employer].  He is not been short of breath.  He has not had fevers or shaking chills.  He had an episode of nausea and vomiting several days ago but otherwise none.  His bowels are working okay.  He has had to take his pain medication at double dose to get adequate relief.  Patient has completed dental work, and has clearance to start on Xgeva.        Oncology History:  Patient was originally diagnosed with prostate cancer in 2006.  He underwent robot-assisted prostatectomy.  He did well until May 2018, when his PSA rose to a high of about 45, and  he was started on Lupron, initially with Casodex, and then with apparatus.  His PSA fell and remained low until recently when he was noted to be rising from a low of 0.82 on  01/01/18, to 3.99 on 06/29/2018.  He was instructed to hold Zytiga.  Since holding Zytiga he is noted swelling in his ankles.  The swelling comes on overnight, improves during the course the day.  He was evaluated with Doppler studies on 06/29/2018 which showed no evidence of blood clots.  He is still bothered by the swelling.  Restaging studies done on 06/04/2018 including bone scan, and CT of the chest abdomen pelvis, showed sclerotic changes throughout the axial skeleton, and involvement also of scapula, some ribs and pelvis. The sites were new since 2016, and also probably new since the CT stone study in May 2018.  Bone scan also showed some progression in disease by report, compared to 2018.  He has developed pain in the back of his calves, for which he takes oxycodone (Tylenol and ibuprofen were ineffective).  The pain followed prolonged episodes of cramping, felt to be related to low magnesium and potassium.  About 6 months ago he had shingles in a left L2 distribution.           Prostate cancer (CMS-HCC)    12/02/2008 Biopsy     Prostate, left apex, needle biopsy  - Adenocarcinoma, Gleason combined score 6 (3+3), 2 of 2 cores involved, tumor  approximately 2 mm in greatest diameter, approximately 10% of tissue involved by  carcinoma.  - See comment    E: ??Prostate, left mid, needle biopsy  - Adenocarcinoma, Gleason combined score 6 (3+3), 1 of 2 cores involved, tumor  approximately 2 mm in greatest diameter, approximately 5% of tissue involved by  carcinoma.  - See comment    F: ??Prostate, left base, needle biopsy  - Adenocarcinoma, Gleason combined score 6 (3+3), 1 of 2 cores involved, tumor  approximately 1 mm in greatest diameter, approximately 5% of tissue involved by  carcinoma.      01/24/2009 Surgery     Prostate, robot assisted laparoscopic prostatectomy  Tumor histologic type: Prostatic adenocarcinoma  Tumor grade (Gleason system): Gleason pattern 7 (3+4)  Approximate percentage of prostate involved by tumor: 40%  Extent of tumor:  Location of tumor involvement in prostate: right and left lobes, anterior  and posterior, apex to base  Confinement/non-confinement of tumor within the prostatic capsule: ??not  confined  Location of extracapsular tumor involvement: ??left prostate toward the base  (ie. block A22)  Type of extracapsular tissue involved within tumor: ?? adipose  tissue/perineural tissue  Angiolymphatic space invasion: not identified  Seminal vesicles: negative  Vas deferens: negative  Histologic assessment of surgical margins: positive margin along a 3 mm  face in the left prostate base (block A25)    Other significant findings: High grade prostatic intraepithelial  Neoplasm;  Lymph nodes: Not applicable  AJCC Staging: pT3a pNx      08/30/2013 Initial Diagnosis     Prostate cancer (CMS-HCC)      04/30/2017 -  Chemotherapy     Lupron 22.5 mg; with bicalutamide x  month      08/01/2017 - 06/30/2018 Chemotherapy     Abiaterone 1000 mg daily; decrease to 750 mg daily at 10/2018;      10/30/2017 -  Cancer Staged     Starta: TP53, TMPSSR  -EGR fusion; no other abnormalities identified      01/14/2018 -  Cancer  Staged     CT Chest:  Numerous sclerotic metastases of the thoracic spine, left scapula  and multiple ribs.  3. Coronary artery and aortic atherosclerosis (ICD10-I70.0).  4. Previously demonstrated left renal mass is not included in the  field of view.      06/04/2018 -  Cancer Staged     CT CAP:  Scattered diffuse sclerotic metastatic lesions are seen throughout the spine as well as the left scapula; some of the lesions are new since prior CT dated 04/20/2015.  - No intrathoracic metastatic disease.  Interval progression of osseous metastases throughout the spine and pelvis compared to 04/22/2017.  -- Cholelithiasis.  -- Hepatic steatosis.      06/04/2018 -  Cancer Staged     Bone scan:  Increased radiotracer uptake throughout the spine, sacrum, and left ilium, consistent with numerous metastases better evaluated on same-day CT. Unchanged metastasis to the left proximal humerus.      07/14/2018 -  Chemotherapy     OP PROSTATE DOCETAXEL/PREDNISONE  Docetaxel 75 mg/m2 evry 21 days      01/07/2019 -  Cancer Staged     CTA Chest:  No evidence of pulmonary embolism.  2. Occlusion of the medial basilar segmental bronchus of the left  lower lobe, nonspecific though could be seen in the setting of  aspiration. Otherwise, no acute findings.  3. Apical predominant paraseptal emphysematous change with suspected  mild progression of smoking related fibrosis with subpleural  reticulation but without evidence of honeycombing. Emphysema  (ICD10-J43.9).  4. Progression of osseous metastatic disease since 11/2017. Detailed  above. No definitive pathologic compression fractures.  5. Hepatic steatosis. 6. Coronary artery calcifications. Aortic Atherosclerosis      01/20/2019 -  Cancer Staged     NM Bone scan: Redemonstrated multiple abnormal foci throughout the appendicular axial skeleton, with interval increase in the extent of the T10 vertebral body lesion and new lesions involving the manubrium, left fifth and seventh ribs. This is most consistent with progression of osseous metastatic disease.         Past Medical History:   Diagnosis Date   ??? Hx of radiation therapy    ??? Hypertension    ??? Prostate cancer (CMS-HCC)       Past Surgical History:   Procedure Laterality Date   ??? IR INSERT PORT AGE GREATER THAN 5 YRS  07/15/2018    IR INSERT PORT AGE GREATER THAN 5 YRS 07/15/2018 Carolin Coy, MD IMG [facility]   ??? PR COLSC FLX W/RMVL OF TUMOR POLYP LESION SNARE TQ N/A 08/16/2015    Procedure: COLONOSCOPY FLEX; W/REMOV TUMOR/LES BY SNARE;  Surgeon: Gwen Pounds, MD;  Location: [facility];  Service: Gastroenterology   ??? PR COLSC FLX WITH DIRECTED SUBMUCOSAL NJX ANY SBST N/A 08/16/2015    Procedure: COLONOSCOPY, FLEXIBLE, PROXIMAL TO SPLENIC FLEXURE; WITH DIRECTED SUBMUCOSAL INJECTION(S), ANY SUBSTANCE;  Surgeon: Gwen Pounds, MD;  Location: [facility];  Service: Gastroenterology   ??? PROSTATE SURGERY          Family History   Problem Relation Age of Onset   ??? Melanoma Neg Hx    ??? Basal cell carcinoma Neg Hx    ??? Squamous cell carcinoma Neg Hx       No family status information on file.     Social History     Occupational History   ??? Not on file   Tobacco Use   ??? Smoking status: Current  Some Day Smoker     Packs/day: 0.30     Years: 21.00     Pack years: 6.30     Types: Cigarettes     Start date: 05/09/1994   ??? Smokeless tobacco: Never Used   ??? Tobacco comment: states only smoke about 3  aday   Substance and Sexual Activity   ??? Alcohol use: Yes     Alcohol/week: 2.0 standard drinks     Types: 2 Cans of beer per week   ??? Drug use: No   ??? Sexual activity: Not on file       Allergies   Allergen Reactions   ??? Penicillins      Other reaction(s): UNKNOWN   ??? Tramadol Dizziness       Current Outpatient Medications   Medication Sig Dispense Refill   ??? albuterol HFA 90 mcg/actuation inhaler Inhale 2 puffs every six (6) hours as needed for wheezing. 1 Inhaler 2   ??? amLODIPine (NORVASC) 10 MG tablet Take 1 tablet (10 mg total) by mouth daily. 30 tablet 11   ??? busPIRone (BUSPAR) 5 MG tablet Take 1 tablet (5 mg total) by mouth Three (3) times a day. 270 tablet 3   ??? enzalutamide (XTANDI) 40 mg capsule Take 4 capsules (160 mg total) by mouth daily 120 capsule 11   ??? magnesium oxide (MAG-OX) 400 mg (241.3 mg magnesium) tablet Take 1 tablet (400 mg total) by mouth Two (2) times a day. 180 tablet 3   ??? omeprazole (PRILOSEC) 20 MG capsule take 2 capsule by mouth once daily 180 capsule 3   ??? oxyCODONE (ROXICODONE) 5 MG immediate release tablet Take 2 tablets (10 mg total) by mouth every six (6) hours as needed for pain. 240 tablet 0   ??? pancrelipase, Lip-Prot-Amyl, (CREON) 24,000-76,000 -120,000 unit CpDR delayed release capsule Take 48,000 units of lipase by mouth Three (3) times a day with a meal. Take an additional 1 capsule with snacks.     ??? potassium chloride SA (KLOR-CON) 20 MEQ tablet Take 3 tablets (60 mEq total) by mouth Two (2) times a day. 90 tablet 3   ??? prochlorperazine (COMPAZINE) 10 MG tablet Take 1 tablet (10 mg total) by mouth every eight (8) hours as needed. 30 tablet 11   ??? tiotropium (SPIRIVA WITH HANDIHALER) 18 mcg inhalation capsule Place 1 capsule (18 mcg total) into inhaler and inhale once daily. 90 capsule 3   ??? traZODone (DESYREL) 50 MG tablet Take 1 tablet (50 mg total) by mouth nightly as needed for sleep. 90 tablet 3   ??? valACYclovir (VALTREX) 500 MG tablet Take 1 tablet (500 mg total) by mouth daily. 90 tablet 3   ??? diphenhydramine HCl (MAGIC MOUTHWASH ORAL) suspension Take 10 mL by mouth 4 (four) times a day as needed. (Patient not taking: Reported on 02/02/2019) 120 mL 1   ??? doxycycline (VIBRAMYCIN) 100 MG capsule Take 1 capsule (100 mg total) by mouth Two (2) times a day for 14 days. 28 capsule 0     Current Facility-Administered Medications   Medication Dose Route Frequency Provider Last Rate Last Dose   ??? heparin, porcine (PF) 100 unit/mL injection 500 Units  500 Units Intravenous Q30 Min PRN Towanda Malkin, MD   500 Units at 03/01/19 1456       The following portions of the patient's history were reviewed and updated as appropriate: allergies, current medications, past family history, past medical history, past social history, past surgical history and  problem list.     Review of Systems   Constitutional: Positive for fatigue. Negative for appetite change, chills, diaphoresis, fever and unexpected weight change.   HENT:   Negative for mouth sores, nosebleeds and trouble swallowing.    Eyes: Negative for eye problems.   Respiratory: Negative for chest tightness, cough, hemoptysis and shortness of breath.    Cardiovascular: Negative for chest pain and leg swelling.   Gastrointestinal: Negative for abdominal pain, blood in stool, constipation, diarrhea and nausea.   Genitourinary: Negative for difficulty urinating and hematuria.    Musculoskeletal: Positive for back pain and myalgias. Negative for arthralgias.   Skin: Negative for rash.   Neurological: Negative for extremity weakness, headaches and seizures.   Hematological: Negative for adenopathy.   Psychiatric/Behavioral: Positive for depression. Negative for confusion.   All other systems reviewed and are negative.        Vital Signs for this encounter:  BSA: 1.87 meters squared  BP 151/76  - Pulse 89  - Temp 36.4 ??C (97.5 ??F) (Tympanic)  - Resp 18  - Wt 72.7 kg (160 lb 4.4 oz)  - SpO2 100%  - BMI 24.37 kg/m??     Physical Exam   Constitutional: He is oriented to person, place, and time.   Ill-appearing African-American gentleman, in mild distress.   Neck: No JVD present. No thyromegaly present.   Cardiovascular: Normal rate, regular rhythm and normal heart sounds.   Pulmonary/Chest: Effort normal and breath sounds normal.   Abdominal: Bowel sounds are normal. He exhibits no distension and no mass. There is no abdominal tenderness.   Musculoskeletal:         General: No tenderness.      Comments: There is trace  edema in bilateral feet and lower extremities about midway up the calf.  Skin is bound down, and there is erythema, particular on the left versus the right.  Both sides are warm, and tender to touch.   Lymphadenopathy:     He has no cervical adenopathy.   No adenopathy neck, supraclavicular, axillary inguinal regions.   Neurological: He is alert and oriented to person, place, and time.   Skin: No rash noted.   Psychiatric: He has a normal mood and affect. His behavior is normal.       Karnofsky/Lansky Performance Status  80, Normal activity with effort; some signs or symptoms of disease (ECOG equivalent 1)    Results:    WBC   Date Value Ref Range Status   03/01/2019 7.3 4.5 - 11.0 10*9/L Final   04/17/2012 7.6 4.5 - 11.0 x10 9th/L Final     HGB   Date Value Ref Range Status   03/01/2019 11.7 (L) 13.5 - 17.5 g/dL Final   16/09/9603 54.0 13.5 - 17.5 G/DL Final     HCT   Date Value Ref Range Status   03/01/2019 33.1 (L) 41.0 - 53.0 % Final   04/17/2012 39.2 (L) 41.0 - 53.0 % Final     Platelet   Date Value Ref Range Status   03/01/2019 268 150 - 440 10*9/L Final   04/17/2012 242 150 - 440 x10 9th/L Final     Creatinine Whole Blood, POC   Date Value Ref Range Status   07/25/2014 1.6 (H) 0.8 - 1.4 mg/dL Final     Creatinine, Whole Blood   Date Value Ref Range Status   07/25/2014 1.4 0.8 - 1.4 mg/dL Final     Creatinine   Date Value Ref Range  Status   03/01/2019 0.90 0.70 - 1.30 mg/dL Final   16/09/9603 5.40 0.70 - 1.30 mg/dL Final     AST   Date Value Ref Range Status   03/01/2019 46 19 - 55 U/L Final   09/11/2011 38 19 - 55 U/L Final     PSA Diagnostic   Date Value Ref Range Status   09/28/2014 4.4 (H) 0.0 - 4.0 ng/mL Final   05/30/2014 5.0 (H) 0.0 - 4.0 ng/mL Final   03/23/2014 4.6 (H) 0.0 - 4.0 ng/mL Final   10/04/2013 3.7 0.0 - 4.0 ng/mL Final   06/24/2013 2.6 0.0 - 4.0 ng/mL Final   01/18/2013 1.6 0.0 - 4.0 NG/ML Final   09/28/2012 1.5 0.0 - 4.0 NG/ML Final   07/03/2012 1.0 0.0 - 4.0 NG/ML Final   12/30/2011 0.5 0.0 - 4.0 NG/ML Final   07/15/2011 0.3 0.0 - 4.0 NG/ML Final   04/08/2011 0.3 0.0 - 4.0 NG/ML Final

## 2019-03-01 ENCOUNTER — Ambulatory Visit
Admit: 2019-03-01 | Discharge: 2019-03-01 | Payer: MEDICAID | Attending: Hematology & Oncology | Primary: Hematology & Oncology

## 2019-03-01 ENCOUNTER — Ambulatory Visit: Admit: 2019-03-01 | Discharge: 2019-03-01 | Payer: MEDICAID

## 2019-03-01 DIAGNOSIS — R2240 Localized swelling, mass and lump, unspecified lower limb: Principal | ICD-10-CM

## 2019-03-01 DIAGNOSIS — Z923 Personal history of irradiation: Principal | ICD-10-CM

## 2019-03-01 DIAGNOSIS — F17210 Nicotine dependence, cigarettes, uncomplicated: Principal | ICD-10-CM

## 2019-03-01 DIAGNOSIS — Z9221 Personal history of antineoplastic chemotherapy: Principal | ICD-10-CM

## 2019-03-01 DIAGNOSIS — C7951 Secondary malignant neoplasm of bone: Principal | ICD-10-CM

## 2019-03-01 DIAGNOSIS — C61 Malignant neoplasm of prostate: Principal | ICD-10-CM

## 2019-03-01 DIAGNOSIS — I2510 Atherosclerotic heart disease of native coronary artery without angina pectoris: Principal | ICD-10-CM

## 2019-03-01 DIAGNOSIS — G4733 Obstructive sleep apnea (adult) (pediatric): Principal | ICD-10-CM

## 2019-03-01 DIAGNOSIS — Z5111 Encounter for antineoplastic chemotherapy: Principal | ICD-10-CM

## 2019-03-01 DIAGNOSIS — Z885 Allergy status to narcotic agent status: Principal | ICD-10-CM

## 2019-03-01 DIAGNOSIS — I10 Essential (primary) hypertension: Principal | ICD-10-CM

## 2019-03-01 DIAGNOSIS — R252 Cramp and spasm: Principal | ICD-10-CM

## 2019-03-01 DIAGNOSIS — Z880 Allergy status to penicillin: Principal | ICD-10-CM

## 2019-03-01 DIAGNOSIS — Z5112 Encounter for antineoplastic immunotherapy: Principal | ICD-10-CM

## 2019-03-01 DIAGNOSIS — E876 Hypokalemia: Principal | ICD-10-CM

## 2019-03-01 LAB — CBC W/ AUTO DIFF
BASOPHILS RELATIVE PERCENT: 1.2 %
EOSINOPHILS ABSOLUTE COUNT: 0.3 10*9/L (ref 0.0–0.4)
EOSINOPHILS RELATIVE PERCENT: 4.2 %
HEMATOCRIT: 33.1 % — ABNORMAL LOW (ref 41.0–53.0)
HEMOGLOBIN: 11.7 g/dL — ABNORMAL LOW (ref 13.5–17.5)
LARGE UNSTAINED CELLS: 3 % (ref 0–4)
LYMPHOCYTES RELATIVE PERCENT: 29.8 %
LYMPHOCYTES RELATIVE PERCENT: 29.8 % — ABNORMAL LOW (ref 4.5–11.0)
MEAN CORPUSCULAR HEMOGLOBIN CONC: 35.4 g/dL (ref 31.0–37.0)
MEAN CORPUSCULAR HEMOGLOBIN: 35 pg — ABNORMAL HIGH (ref 26.0–34.0)
MEAN CORPUSCULAR VOLUME: 98.7 fL (ref 80.0–100.0)
MONOCYTES ABSOLUTE COUNT: 0.6 10*9/L (ref 0.2–0.8)
MONOCYTES RELATIVE PERCENT: 8.6 %
NEUTROPHILS ABSOLUTE COUNT: 3.9 10*9/L (ref 2.0–7.5)
NEUTROPHILS RELATIVE PERCENT: 53.6 %
PLATELET COUNT: 268 10*9/L (ref 150–440)
RED BLOOD CELL COUNT: 3.36 10*12/L — ABNORMAL LOW (ref 4.50–5.90)
RED CELL DISTRIBUTION WIDTH: 13.2 % (ref 12.0–15.0)
WBC ADJUSTED: 7.3 10*9/L (ref 4.5–11.0)

## 2019-03-01 LAB — CREATININE
CREATININE: 0.9 mg/dL (ref 0.70–1.30)
EGFR CKD-EPI AA MALE: >90 mL/min/{1.73_m2} (ref >=60)

## 2019-03-01 LAB — HEPATIC FUNCTION PANEL
ALBUMIN: 4.2 g/dL (ref 3.5–5.0)
ALBUMIN: 4.2 g/dL — ABNORMAL HIGH (ref 3.5–5.0)
ALKALINE PHOSPHATASE: 148 U/L — ABNORMAL HIGH (ref 38–126)
BILIRUBIN DIRECT: <0.1 mg/dL (ref 0.00–0.40)
BILIRUBIN TOTAL: 0.4 mg/dL (ref 0.0–1.2)

## 2019-03-01 LAB — CALCIUM: CALCIUM: 10.6 mg/dL — ABNORMAL HIGH (ref 8.5–10.2)

## 2019-03-01 LAB — MAGNESIUM: MAGNESIUM: 1.6 mg/dL (ref 1.6–2.2)

## 2019-03-01 LAB — ALBUMIN: ALBUMIN: 4.2 g/dL (ref 3.5–5.0)

## 2019-03-01 MED ORDER — OXYCODONE 5 MG TABLET
ORAL_TABLET | Freq: Four times a day (QID) | ORAL | 0 refills | 0 days | Status: SS | PRN
Start: 2019-03-01 — End: 2019-03-11

## 2019-03-01 MED ORDER — DOXYCYCLINE HYCLATE 100 MG CAPSULE
ORAL_CAPSULE | Freq: Two times a day (BID) | ORAL | 0 refills | 0.00000 days | Status: CP
Start: 2019-03-01 — End: 2019-03-29

## 2019-03-01 NOTE — Unmapped (Addendum)
--  Start on doxycycline 100 mg twice daily.  Hold magnesium on the days that she take doxycycline.  --Try holding Xtandi (enzalutamide) for the next 3 to 7 days, to see if the discomfort in your shoulders and lower extremities improves off of this drug.  --I will contact you when we have the results of today's PSA check  --Try to eliminate cigarettes and other sources of nicotine.  --We will plan to see you back in about 1 week, to see if the legs have improved.

## 2019-03-01 NOTE — Unmapped (Signed)
[name] here for followup with Dr. Claude Manges.    Weight: stable  Wt Readings from Last 3 Encounters:   03/01/19 72.7 kg (160 lb 4.4 oz)   02/02/19 73.1 kg (161 lb 1.6 oz)   01/19/19 70.3 kg (155 lb)     Energy Level/Fatigue/Active Level: Energy level real low - fatigued, weak  Elimination/Discharge: No issues reported today  Nausea/Vomitting: Nausea at times especially in the mornings.  Takes compazine for it.  Appetite/Diet: Morene Antu - comes and goes.  Some days he eats and other days he doesn't.   Pain/Swelling: 8/10 in ankles, shoulders, feet.  Feet are also swollen.   Skin: Redness on feet/ankles  Psychosocial: Ok today.  Refills of Meds: Pain meds    Lupron injection given IM in left gluteal.   Xgeva given SQ in left arm  .   Accessed patient's port with a 20g 3/4 inch needle.  Obtained labs and flushed port with 20ml of normal saline and 5ml of heparin. Patient's port was deaccessed prior to leaving clinic.      Notified Dr. Claude Manges.    Marland Kitchen

## 2019-03-05 NOTE — Unmapped (Signed)
Xtandi being held for 3-7 days.  Per patient he has an appt on March 18th.  He has a few capsules on hand.  Rescheduling clincal/refill call for next week to touch base.

## 2019-03-10 ENCOUNTER — Ambulatory Visit: Admit: 2019-03-10 | Discharge: 2019-03-11 | Payer: MEDICAID

## 2019-03-10 DIAGNOSIS — C61 Malignant neoplasm of prostate: Principal | ICD-10-CM

## 2019-03-10 DIAGNOSIS — Z8546 Personal history of malignant neoplasm of prostate: Principal | ICD-10-CM

## 2019-03-10 DIAGNOSIS — J449 Chronic obstructive pulmonary disease, unspecified: Principal | ICD-10-CM

## 2019-03-10 DIAGNOSIS — F17210 Nicotine dependence, cigarettes, uncomplicated: Principal | ICD-10-CM

## 2019-03-10 DIAGNOSIS — I10 Essential (primary) hypertension: Principal | ICD-10-CM

## 2019-03-10 DIAGNOSIS — E875 Hyperkalemia: Principal | ICD-10-CM

## 2019-03-10 DIAGNOSIS — R251 Tremor, unspecified: Principal | ICD-10-CM

## 2019-03-10 DIAGNOSIS — Z880 Allergy status to penicillin: Principal | ICD-10-CM

## 2019-03-10 DIAGNOSIS — K861 Other chronic pancreatitis: Principal | ICD-10-CM

## 2019-03-10 DIAGNOSIS — Z923 Personal history of irradiation: Principal | ICD-10-CM

## 2019-03-10 DIAGNOSIS — R0789 Other chest pain: Principal | ICD-10-CM

## 2019-03-10 DIAGNOSIS — K210 Gastro-esophageal reflux disease with esophagitis: Principal | ICD-10-CM

## 2019-03-10 DIAGNOSIS — C7951 Secondary malignant neoplasm of bone: Principal | ICD-10-CM

## 2019-03-10 DIAGNOSIS — Z885 Allergy status to narcotic agent status: Principal | ICD-10-CM

## 2019-03-10 DIAGNOSIS — E785 Hyperlipidemia, unspecified: Principal | ICD-10-CM

## 2019-03-10 LAB — COMPREHENSIVE METABOLIC PANEL
ALBUMIN: 3.7 g/dL (ref 3.5–5.0)
ALKALINE PHOSPHATASE: 232 U/L — ABNORMAL HIGH (ref 38–126)
ALT (SGPT): 11 U/L (ref <50)
ANION GAP: 13 mmol/L (ref 7–15)
AST (SGOT): 49 U/L (ref 19–55)
BILIRUBIN TOTAL: 0.3 mg/dL (ref 0.0–1.2)
BLOOD UREA NITROGEN: 22 mg/dL — ABNORMAL HIGH (ref 7–21)
CALCIUM: 8.5 mg/dL (ref 8.5–10.2)
CO2: 14 mmol/L — ABNORMAL LOW (ref 22.0–30.0)
CREATININE: 0.81 mg/dL (ref 0.70–1.30)
EGFR CKD-EPI AA MALE: >90 mL/min/{1.73_m2} (ref >=60)
EGFR CKD-EPI AA MALE: >90 mL/min/1.73m2 — ABNORMAL HIGH (ref >=60–<50)
EGFR CKD-EPI NON-AA MALE: >90 mL/min/{1.73_m2} (ref >=60)
GLUCOSE RANDOM: 102 mg/dL (ref 65–179)
POTASSIUM: 5.9 mmol/L — ABNORMAL HIGH (ref 3.5–5.0)
PROTEIN TOTAL: 7.1 g/dL (ref 6.5–8.3)
SODIUM: 139 mmol/L (ref 135–145)

## 2019-03-10 LAB — BASIC METABOLIC PANEL
ANION GAP: 11 mmol/L (ref 7–15)
BLOOD UREA NITROGEN: 20 mg/dL (ref 7–21)
BUN / CREAT RATIO: 26
CALCIUM: 7.8 mg/dL — ABNORMAL LOW (ref 8.5–10.2)
CALCIUM: 7.8 mg/dL — ABNORMAL LOW (ref 8.5–10.2)
CHLORIDE: 109 mmol/L — ABNORMAL HIGH (ref 98–107)
CO2: 18 mmol/L — ABNORMAL LOW (ref 22.0–30.0)
CREATININE: 0.78 mg/dL (ref 0.70–1.30)
EGFR CKD-EPI AA MALE: >90 mL/min/{1.73_m2} (ref >=60)
GLUCOSE RANDOM: 111 mg/dL (ref 65–179)
POTASSIUM: 4.8 mmol/L (ref 3.5–5.0)
SODIUM: 138 mmol/L (ref 135–145)

## 2019-03-10 LAB — CBC W/ AUTO DIFF
BASOPHILS ABSOLUTE COUNT: 0.1 10*9/L (ref 0.0–0.1)
BASOPHILS RELATIVE PERCENT: 1.1 %
EOSINOPHILS ABSOLUTE COUNT: 0.1 10*9/L (ref 0.0–0.4)
EOSINOPHILS RELATIVE PERCENT: 0.6 %
HEMATOCRIT: 34.2 % — ABNORMAL LOW (ref 41.0–53.0)
HEMOGLOBIN: 11.8 g/dL — ABNORMAL LOW (ref 13.5–17.5)
LARGE UNSTAINED CELLS: 2 % (ref 0–4)
LYMPHOCYTES ABSOLUTE COUNT: 1.5 10*9/L (ref 1.5–5.0)
LYMPHOCYTES ABSOLUTE COUNT: 1.5 10*9/L — ABNORMAL LOW (ref 1.5–5.0)
LYMPHOCYTES RELATIVE PERCENT: 17.7 %
MEAN CORPUSCULAR HEMOGLOBIN CONC: 34.5 g/dL (ref 31.0–37.0)
MEAN CORPUSCULAR VOLUME: 97.6 fL (ref 80.0–100.0)
MEAN PLATELET VOLUME: 8.2 fL (ref 7.0–10.0)
MONOCYTES ABSOLUTE COUNT: 0.7 10*9/L (ref 0.2–0.8)
NEUTROPHILS RELATIVE PERCENT: 70.2 %
PLATELET COUNT: 224 10*9/L (ref 150–440)
RED BLOOD CELL COUNT: 3.51 10*12/L — ABNORMAL LOW (ref 4.50–5.90)
RED CELL DISTRIBUTION WIDTH: 13 % (ref 12.0–15.0)
WBC ADJUSTED: 8.7 10*9/L (ref 4.5–11.0)

## 2019-03-10 LAB — LIPASE: LIPASE: 153 U/L (ref 44–232)

## 2019-03-10 LAB — TROPONIN I: TROPONIN I: <0.06 ng/mL (ref <0.060)

## 2019-03-10 LAB — PROTIME-INR: INR: 1.13 sec (ref 10.2–13.1)

## 2019-03-10 NOTE — Unmapped (Signed)
General Medicine History and Physical    Assessment/Plan:    Principal Problem:    Chest pain, mid sternal  Active Problems:    Reflux esophagitis    Prostate cancer (CMS-HCC)    Acute midline thoracic back pain    Hyperkalemia    COPD (chronic obstructive pulmonary disease) (CMS-HCC)      Joseph Phillips is a 58 y.o. male with PMHx as noted below that presents to [facility] with Chest pain, mid sternal x 3 days likely secondary to prostate cancer mets to sternum and T-spine.    1) Chest Pain x 3 days  - unlikely this is cardiac given reproducible on palpation and patient with new mets to sternum on chest CT  - normal CXR and troponinX1 is negative, repeat troponin and stop if negative  - no need for further cardiac testing    2) Prostate Cancer with [place] Mets to Sternum and T-spine  - most likely cause of chesr apin  -apply lidoderm patch to chest  - give one dose of toradol  - start standing tylenol  - oxycodone +morphine prn  - once pain is controlled on orals, consider referral to radiation oncology for palliative treatment    3) Hyperkalemia (K=5.9)  - likely secondary to potassium supplement that patient has been taking + dehydration from N/V  - stop home KCl, give kayexalate x1 dose and continue IV fluids  - repeat K later tonoght  - no peaked T-waves on EKG    4) Nausea and Vomitting x several days  - unclear etiology, denies diarrhea or abdominal pain  - CT abd was unremarkable  - check lipase given h/o pancreatitis  - start IV PPI  - zofran prn        Code Status:  Full Code  ___________________________________________________________________    Chief Complaint  Chief Complaint   Patient presents with   ??? Chest Heaviness       HPI:  Joseph Phillips is a 58 y.o. male w/ PMH of prostate CA and HTN presenting to the ED with chest pain. Pt reports that he developed intermittent substernal chest tightness and back pain yesterday with associated vomiting and chills. He states that his pain is worsened with movement. Family member at bedside also reports that the pt had sudden onset weakness on Sunday night, had to be helped to sit up. No hx of MI or stroke. No familial cardiac hx. Pt endorses tobacco use.  Also has hyperlipidemia and hypertension.  Pt denies LE edema, shortness of breath, or diarrhea. Per oncology note on 3/9, pt received Lupron and Xgeva. He was instructed to hold Xtandi for 3-7 days due to LE skin changes. Pt states that he is continuing to hold this medication.  Chronic abdominal tenderness he associates with chronic pancreatitis.  In ED, patient work-up is significant for chest CT with sternal T-spine mets and K=5.9. EKG and troponin x1 was unrerkable    Allergies:  Penicillins and Tramadol    Medications:   Prior to Admission medications    Medication Dose, Route, Frequency   albuterol HFA 90 mcg/actuation inhaler 2 puffs, Inhalation, Every 6 hours PRN   amLODIPine (NORVASC) 10 MG tablet 10 mg, Oral, Daily (standard)   busPIRone (BUSPAR) 5 MG tablet 5 mg, Oral, 3 times a day (standard)   diphenhydramine HCl (MAGIC MOUTHWASH ORAL) suspension 10 mL, Oral, 4 times daily PRN   doxycycline (VIBRAMYCIN) 100 MG capsule 100 mg, Oral, 2 times a day (standard)   enzalutamide (  XTANDI) 40 mg capsule 160 mg, Oral, Daily (standard)   magnesium oxide (MAG-OX) 400 mg (241.3 mg magnesium) tablet 1 tablet, Oral, 2 times a day (standard)   omeprazole (PRILOSEC) 20 MG capsule take 2 capsule by mouth once daily   oxyCODONE (ROXICODONE) 5 MG immediate release tablet 10 mg, Oral, Every 6 hours PRN   pancrelipase, Lip-Prot-Amyl, (CREON) 24,000-76,000 -120,000 unit CpDR delayed release capsule 48,000 units of lipase, Oral, 3 times a day (with meals), Take an additional 1 capsule with snacks.    potassium chloride SA (KLOR-CON) 20 MEQ tablet 60 mEq, Oral, 2 times a day (standard)   prochlorperazine (COMPAZINE) 10 MG tablet 10 mg, Oral, Every 8 hours PRN   tiotropium (SPIRIVA WITH HANDIHALER) 18 mcg inhalation capsule 18 mcg, Inhalation, Daily (RT)   valACYclovir (VALTREX) 500 MG tablet 500 mg, Oral, Daily (standard)   traZODone (DESYREL) 50 MG tablet 50 mg, Oral, Nightly PRN       Medical History:  Past Medical History:   Diagnosis Date   ??? Hx of radiation therapy    ??? Hypertension    ??? Prostate cancer (CMS-HCC)        Surgical History:  Past Surgical History:   Procedure Laterality Date   ??? IR INSERT PORT AGE GREATER THAN 5 YRS  07/15/2018    IR INSERT PORT AGE GREATER THAN 5 YRS 07/15/2018 Carolin Coy, MD [facility]   ??? PR COLSC FLX W/RMVL OF TUMOR POLYP LESION SNARE TQ N/A 08/16/2015    Procedure: COLONOSCOPY FLEX; W/REMOV TUMOR/LES BY SNARE;  Surgeon: Gwen Pounds, MD;  Location: [facility];  Service: Gastroenterology   ??? PR COLSC FLX WITH DIRECTED SUBMUCOSAL NJX ANY SBST N/A 08/16/2015    Procedure: COLONOSCOPY, FLEXIBLE, PROXIMAL TO SPLENIC FLEXURE; WITH DIRECTED SUBMUCOSAL INJECTION(S), ANY SUBSTANCE;  Surgeon: Gwen Pounds, MD;  Location: GI [facility]URES [facility];  Service: Gastroenterology   ??? PROSTATE SURGERY         Social History:  Tobacco use:   reports that he has been smoking cigarettes. He started smoking about 24 years ago. He has a 6.30 pack-year smoking history. He has never used smokeless tobacco.  Alcohol use:   reports current alcohol use of about 2.0 standard drinks of alcohol per week.  Drug use:  reports no history of drug use.  Living situation: the patient lives alone.    Family History:  Family History   Problem Relation Age of Onset   ??? Melanoma Neg Hx    ??? Basal cell carcinoma Neg Hx    ??? Squamous cell carcinoma Neg Hx        Review of Systems:  10 systems reviewed and are negative unless otherwise mentioned in HPI      Physical Exam:  Temp:  [35.6 ??C (96.1 ??F)-36.8 ??C (98.2 ??F)] 35.6 ??C (96.1 ??F)  Heart Rate:  [86-103] 86  Resp:  [18-24] 18  BP: (140-182)/(77-91) 182/91  SpO2:  [98 %-100 %] 98 %  Body mass index is 24.33 kg/m??.    General: Elderly appearing man who appears uncomfortable whenever he attempts to reposition himself in bed  HEENT: PERL, anicteric sclera,dry oral mucosa  Lungs: CTA bilat, no wheezes  Abd: +BS, soft, NT/ND  Ext: +trace pedal edema    Test Results:  Data Review:    All lab results last 24 hours:    Recent Results (from the past 24 hour(s))   ECG 12 Lead    Collection  Time: 03/10/19 10:48 AM   Result Value Ref Range    EKG Systolic BP  mmHg    EKG Diastolic BP  mmHg    EKG Ventricular Rate 101 BPM    EKG Atrial Rate 101 BPM    EKG P-R Interval 134 ms    EKG QRS Duration 72 ms    EKG Q-T Interval 354 ms    EKG QTC Calculation 459 ms    EKG Calculated P Axis 53 degrees    EKG Calculated R Axis 16 degrees    EKG Calculated T Axis 51 degrees    QTC Fredericia 421 ms   ECG 12 Lead    Collection Time: 03/10/19 11:12 AM   Result Value Ref Range    EKG Systolic BP  mmHg    EKG Diastolic BP  mmHg    EKG Ventricular Rate 85 BPM    EKG Atrial Rate 85 BPM    EKG P-R Interval 140 ms    EKG QRS Duration 74 ms    EKG Q-T Interval 382 ms    EKG QTC Calculation 454 ms    EKG Calculated P Axis 64 degrees    EKG Calculated R Axis 29 degrees    EKG Calculated T Axis 54 degrees    QTC Fredericia 429 ms   Troponin I    Collection Time: 03/10/19 11:30 AM   Result Value Ref Range    Troponin I <0.060 <0.060 ng/mL   PT-INR    Collection Time: 03/10/19 11:30 AM   Result Value Ref Range    PT 13.0 10.2 - 13.1 sec    INR 1.13    APTT    Collection Time: 03/10/19 11:30 AM   Result Value Ref Range    APTT 36.6 25.9 - 39.5 sec    Heparin Correlation 0.2    Comprehensive Metabolic Panel    Collection Time: 03/10/19 11:30 AM   Result Value Ref Range    Sodium 139 135 - 145 mmol/L    Potassium 5.9 (H) 3.5 - 5.0 mmol/L    Chloride 112 (H) 98 - 107 mmol/L    Anion Gap 13 7 - 15 mmol/L    CO2 14.0 (L) 22.0 - 30.0 mmol/L    BUN 22 (H) 7 - 21 mg/dL    Creatinine 1.61 0.96 - 1.30 mg/dL    BUN/Creatinine Ratio 27     EGFR CKD-EPI Non-African American, Male >90 >=60 mL/min/1.46m2    EGFR CKD-EPI African American, Male >90 >=60 mL/min/1.83m2    Glucose 102 65 - 179 mg/dL    Calcium 8.5 8.5 - 04.5 mg/dL    Albumin 3.7 3.5 - 5.0 g/dL    Total Protein 7.1 6.5 - 8.3 g/dL    Total Bilirubin 0.3 0.0 - 1.2 mg/dL    AST 49 19 - 55 U/L    ALT 11 <50 U/L    Alkaline Phosphatase 232 (H) 38 - 126 U/L   CBC w/ Differential    Collection Time: 03/10/19 11:30 AM   Result Value Ref Range    WBC 8.7 4.5 - 11.0 10*9/L    RBC 3.51 (L) 4.50 - 5.90 10*12/L    HGB 11.8 (L) 13.5 - 17.5 g/dL    HCT 40.9 (L) 81.1 - 53.0 %    MCV 97.6 80.0 - 100.0 fL    MCH 33.6 26.0 - 34.0 pg    MCHC 34.5 31.0 - 37.0 g/dL    RDW 91.4 78.2 - 95.6 %  MPV 8.2 7.0 - 10.0 fL    Platelet 224 150 - 440 10*9/L    Neutrophils % 70.2 %    Lymphocytes % 17.7 %    Monocytes % 8.5 %    Eosinophils % 0.6 %    Basophils % 1.1 %    Absolute Neutrophils 6.1 2.0 - 7.5 10*9/L    Absolute Lymphocytes 1.5 1.5 - 5.0 10*9/L    Absolute Monocytes 0.7 0.2 - 0.8 10*9/L    Absolute Eosinophils 0.1 0.0 - 0.4 10*9/L    Absolute Basophils 0.1 0.0 - 0.1 10*9/L    Large Unstained Cells 2 0 - 4 %       Imaging: Radiology studies were personally reviewed    EKG: normal EKG, normal sinus rhythm, unchanged from previous tracings.

## 2019-03-10 NOTE — Unmapped (Signed)
Emergency Department Provider Note        ED Clinical Impression     Final diagnoses:   Chest pressure (Primary)       ED Assessment/Plan     Initial Impression: 58 y.o. male w/ PMH of prostate CA and HTN presenting to the ED with chest heaviness. Appears chronically ill. Upper abdominal tenderness. Tremulous, unclear whether this is 2/2 pain or anxiety. Mildly tachycardic to 103. Will obtain cardiac workup to rule out ACS.  Also consider aortic dissection given chest pain radiates to back.    History     Chief Complaint   Patient presents with   ??? Chest Heaviness     HPI     Joseph Phillips is a 58 y.o. male w/ PMH of prostate CA and HTN presenting to the ED with chest heaviness. Pt reports that he developed intermittent substernal chest tightness and back pain yesterday with associated vomiting and chills. He states that his pain is worsened with movement. Family member at bedside also reports that the pt had sudden onset weakness on Sunday night, had to be helped to sit up. No hx of MI or stroke. No familial cardiac hx. Pt endorses tobacco use.  Also has hyperlipidemia and hypertension.  Pt denies LE edema, shortness of breath, or diarrhea. Per oncology note on 3/9, pt received Lupron and Xgeva. He was instructed to hold Xtandi for 3-7 days due to LE skin changes. Pt states that he is continuing to hold this medication.  Chronic abdominal tenderness he associates with chronic pancreatitis.    Past Medical History:   Diagnosis Date   ??? Hx of radiation therapy    ??? Hypertension    ??? Prostate cancer (CMS-HCC)        Past Surgical History:   Procedure Laterality Date   ??? IR INSERT PORT AGE GREATER THAN 5 YRS  07/15/2018    IR INSERT PORT AGE GREATER THAN 5 YRS 07/15/2018 Carolin Coy, MD [facility]   ??? PR COLSC FLX W/RMVL OF TUMOR POLYP LESION SNARE TQ N/A 08/16/2015    Procedure: COLONOSCOPY FLEX; W/REMOV TUMOR/LES BY SNARE;  Surgeon: Gwen Pounds, MD;  Location: [facility];  Service: Gastroenterology   ??? PR COLSC FLX WITH DIRECTED SUBMUCOSAL NJX ANY SBST N/A 08/16/2015    Procedure: COLONOSCOPY, FLEXIBLE, PROXIMAL TO SPLENIC FLEXURE; WITH DIRECTED SUBMUCOSAL INJECTION(S), ANY SUBSTANCE;  Surgeon: Gwen Pounds, MD;  Location: [facility];  Service: Gastroenterology   ??? PROSTATE SURGERY         Family History   Problem Relation Age of Onset   ??? Melanoma Neg Hx    ??? Basal cell carcinoma Neg Hx    ??? Squamous cell carcinoma Neg Hx        Social History     Socioeconomic History   ??? Marital status: Single     Spouse name: None   ??? Number of children: None   ??? Years of education: None   ??? Highest education level: None   Occupational History   ??? None   Social Needs   ??? Financial resource strain: None   ??? Food insecurity     Worry: None     Inability: None   ??? Transportation needs     Medical: None     Non-medical: None   Tobacco Use   ??? Smoking status: Current Some Day Smoker     Packs/day: 0.30     Years:  21.00     Pack years: 6.30     Types: Cigarettes     Start date: 05/09/1994   ??? Smokeless tobacco: Never Used   ??? Tobacco comment: states only smoke about 3  aday   Substance and Sexual Activity   ??? Alcohol use: Yes     Alcohol/week: 2.0 standard drinks     Types: 2 Cans of beer per week   ??? Drug use: No   ??? Sexual activity: None   Lifestyle   ??? Physical activity     Days per week: None     Minutes per session: None   ??? Stress: None   Relationships   ??? Social Wellsite geologist on phone: None     Gets together: None     Attends religious service: None     Active member of club or organization: None     Attends meetings of clubs or organizations: None     Relationship status: None   Other Topics Concern   ??? Do you use sunscreen? No   ??? Tanning bed use? No   ??? Are you easily burned? No   ??? Excessive sun exposure? No   ??? Blistering sunburns? No   Social History Narrative   ??? None       Review of Systems   Constitutional: Positive for chills. Negative for fever.   Respiratory: Positive for chest tightness.    Cardiovascular: Positive for chest pain.   Gastrointestinal: Positive for vomiting. Negative for diarrhea.   Musculoskeletal: Positive for back pain.   Neurological: Positive for weakness.   All other systems reviewed and are negative.      Physical Exam     BP 140/82  - Pulse 103  - Temp 36.6 ??C (97.8 ??F) (Oral)  - Resp 24  - Ht 172.7 cm (5' 8)  - Wt 72.6 kg (160 lb)  - SpO2 100%  - BMI 24.33 kg/m??     Physical Exam  Vitals signs reviewed.   Constitutional:       General: He is not in acute distress.     Appearance: He is well-developed.      Comments: Chronically ill appearing. Tremulous, unsure whether 2/2 pain or anxiety.    HENT:      Head: Normocephalic and atraumatic.   Eyes:      General: No scleral icterus.  Neck:      Musculoskeletal: Normal range of motion and neck supple.   Cardiovascular:      Rate and Rhythm: Regular rhythm. Tachycardia present.      Heart sounds: Normal heart sounds. No murmur. No friction rub. No gallop.    Pulmonary:      Effort: Pulmonary effort is normal. No respiratory distress.      Breath sounds: Normal breath sounds.   Abdominal:      Palpations: Abdomen is soft.      Tenderness: There is abdominal tenderness (upper abdomen).   Musculoskeletal: Normal range of motion.   Skin:     General: Skin is warm and dry.      Findings: No rash.   Neurological:      Mental Status: He is alert.   Psychiatric:         Behavior: Behavior normal.         ED Course     11:23 AM  EKG with sinus tachycardia, 101 bpm. PR 134. QRS 72. QTC 459. No ST segment changes concerning  for STEMI. Repeat EKG with NSR, 85 bpm. Similar intervals. Still with no ST segment changes.     12:07 PM  CXR clear. Will obtain CTA chest.    1:47 PM  CTA resulted negative. Will discuss with [facility] for admission for chest pain rule out.         Documentation assistance was provided by Nicholas Lose, Scribe, on March 10, 2019 at 11:00 AM for Cheri Rous, NP.     March 10, 2019 2:48 PM. Documentation assistance provided by the scribe. I was present during the time the encounter was recorded. The information recorded by the scribe was done at my direction and has been reviewed and validated by me.         Junious Dresser [place], [place]  03/10/19 [phone]

## 2019-03-10 NOTE — Unmapped (Signed)
Reports constant mid sternal chest heaviness that is radiating to his thoracic back.  Reports n/v and diaphoresis.  Denies SOB.  Took Oxycodone around 4 am.

## 2019-03-11 LAB — COMPREHENSIVE METABOLIC PANEL
ALBUMIN: 3.1 g/dL — ABNORMAL LOW (ref 3.5–5.0)
ALKALINE PHOSPHATASE: 222 U/L — ABNORMAL HIGH (ref 38–126)
ALT (SGPT): 13 U/L (ref <50)
ANION GAP: 10 mmol/L (ref 7–15)
AST (SGOT): 57 U/L — ABNORMAL HIGH (ref 19–55)
BILIRUBIN TOTAL: 0.6 mg/dL (ref 0.0–1.2)
BLOOD UREA NITROGEN: 17 mg/dL (ref 7–21)
BUN / CREAT RATIO: 23
BUN / CREAT RATIO: 23 g/dL — ABNORMAL LOW (ref 3.5–5.0)
CALCIUM: 7.2 mg/dL — ABNORMAL LOW (ref 8.5–10.2)
CHLORIDE: 109 mmol/L — ABNORMAL HIGH (ref 98–107)
CO2: 20 mmol/L — ABNORMAL LOW (ref 22.0–30.0)
EGFR CKD-EPI AA MALE: >90 mL/min/{1.73_m2} (ref >=60)
EGFR CKD-EPI NON-AA MALE: >90 mL/min/{1.73_m2} (ref >=60)
POTASSIUM: 4.3 mmol/L (ref 3.5–5.0)
PROTEIN TOTAL: 6.4 g/dL — ABNORMAL LOW (ref 6.5–8.3)
SODIUM: 139 mmol/L (ref 135–145)

## 2019-03-11 MED ORDER — ACETAMINOPHEN 500 MG TABLET
ORAL_TABLET | Freq: Three times a day (TID) | ORAL | 0 refills | 0.00000 days
Start: 2019-03-11 — End: 2019-03-25

## 2019-03-11 MED ORDER — OXYCODONE 5 MG TABLET
ORAL | 0.00000 days | PRN
Start: 2019-03-11 — End: 2019-03-19

## 2019-03-11 MED ORDER — DICLOFENAC 1 % TOPICAL GEL
Freq: Four times a day (QID) | TOPICAL | 0 refills | 0 days | Status: CP
Start: 2019-03-11 — End: 2019-06-14

## 2019-03-11 MED ORDER — DIPHENHYDRAMINE 25 MG TABLET
ORAL_CAPSULE | Freq: Four times a day (QID) | ORAL | 0 refills | 0 days | PRN
Start: 2019-03-11 — End: ?

## 2019-03-11 NOTE — Unmapped (Signed)
Alert and oriented x4. Vss; afebrile. No c/o n/v.  Fall precautions maintained. Uses call bell appropriately. IV fluids infused per order. Prn oxy for back pain and effective. Denies chest pain. All needs met. Will continue to monitor.    Problem: Adult Inpatient Plan of Care  Goal: Plan of Care Review  Outcome: Progressing  Goal: Patient-Specific Goal (Individualization)  Outcome: Progressing  Goal: Absence of Hospital-Acquired Illness or Injury  Outcome: Progressing  Goal: Optimal Comfort and Wellbeing  Outcome: Progressing  Goal: Readiness for Transition of Care  Outcome: Progressing  Goal: Rounds/Family Conference  Outcome: Progressing     Problem: Fall Injury Risk  Goal: Absence of Fall and Fall-Related Injury  Outcome: Progressing     Problem: Hypertension Comorbidity  Goal: Blood Pressure in Desired Range  Outcome: Progressing

## 2019-03-11 NOTE — Unmapped (Signed)
Hi,     [facility] contacted the [employer] requesting to speak with the care team of Joseph Phillips to discuss:    Would like to reschedule appointment from 03/10/19. Patient was admitted on 03/10/19.    Please contact patient at [phone].    Check Indicates criteria has been reviewed and confirmed with the patient:    [x]  Preferred Name   [x]  DOB and/or MR#  [x]  Preferred Contact Method  [x]  Phone Number(s)   []  MyChart     Thank you,   [name]  [facility]   [phone]

## 2019-03-11 NOTE — Unmapped (Signed)
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    Procedures: one    Pertinent Test Results:   Lab Results   Component Value Date    WBC 8.7 03/10/2019    HGB 11.8 (L) 03/10/2019    HCT 34.2 (L) 03/10/2019    PLT 224 03/10/2019       Lab Results   Component Value Date    NA 139 03/11/2019    K 4.3 03/11/2019    CL 109 (H) 03/11/2019    CO2 20.0 (L) 03/11/2019    BUN 17 03/11/2019    CREATININE 0.73 03/11/2019    GLU 96 03/11/2019    CALCIUM 7.2 (L) 03/11/2019    MG 1.6 03/01/2019    PHOS 4.1 03/01/2019       Lab Results   Component Value Date    BILITOT 0.6 03/11/2019    BILIDIR <0.10 03/01/2019    PROT 6.4 (L) 03/11/2019    ALBUMIN 3.1 (L) 03/11/2019    ALT 13 03/11/2019    AST 57 (H) 03/11/2019    ALKPHOS 222 (H) 03/11/2019       Lab Results   Component Value Date    INR 1.13 03/10/2019    APTT 36.6 03/10/2019     Xr Chest Portable    Result Date: 03/10/2019  EXAM: XR CHEST PORTABLE DATE: 03/10/2019 11:33 AM ACCESSION: [id number] UN DICTATED: 03/10/2019 11:36 AM INTERPRETATION LOCATION: [facility] CLINICAL INDICATION: 58 years old Male with CHEST PAIN  COMPARISON: 11/11/2018 and earlier TECHNIQUE: Portable Chest Radiograph. FINDINGS: Stable right chest port catheter, terminating over the cavoatrial junction. Low lung volumes. No focal airspace disease. No pleural effusion or pneumothorax Stable cardiomediastinal silhouette.     No acute airspace disease.    Cta Chest/abd (aortic Dissection)    Result Date: 03/10/2019  EXAM: CTA Chest, Abdomen, Pelvis for Aortic Dissection DATE: 03/10/2019 1:03 PM ACCESSION: [id number] UN DICTATED: 03/10/2019 1:12 PM INTERPRETATION LOCATION: [facility] CLINICAL INDICATION: 58 years old Male with Chest pain radiating to back. ; Chest pain or back pain, aortic dissection suspected  COMPARISON: Nuclear medicine bone scan 01/20/2019. CT chest abdomen pelvis 06/04/2018. TECHNIQUE: A spiral CT of the chest was obtained without IV contrast from the thoracic inlet through the hemidiaphragms. Images were reconstructed in the axial plane. Next, a spiral CTA  of the chest, abdomen and pelvis was obtained with IV contrast from the thoracic inlet through the aortic bifurcation. Images were reconstructed in the axial plane.  Multiplanar reformatted and MIP images are provided for further evaluation of the aorta. FINDINGS: VASCULAR: Normal caliber thoracic aorta. No evidence of acute or intramural hematoma on noncontrast images. The left vertebral artery arises directly from the aorta, normal variant. Proximal great vessels are patent. No evidence of aortic dissection. Normal caliber abdominal aorta with mild scattered calcified and noncalcified atheromatous plaque. The celiac, SMA and IMA are patent. Single right renal artery is patent. Main left renal artery  and small accessory left renal artery arising from the aorta are patent. Pulmonary arteries normal in caliber. No obvious venous abnormality on this limited arterial phase examination. CHEST: Heart size qualitatively normal. No pericardial effusion. Coronary atherosclerosis. Accessed right chest port with catheter tip terminating at the superior cavoatrial junction. Subcentimeter mediastinal and let hilar lymph nodes measuring upto0.6cm No lymphadenopathy by size criteria. Mucoid impacted airways in left lower lobe with adjacent subtle ground glass opacities. Left lower lobe subsegmental atelectasis.Mild emphysema.  No pleural effusion or pneumothorax. ABDOMEN and PELVIS: Diffuse hepatic steatosis. No suspicious hepatic lesion. Subcentimeter calcified granulomas in the liver. Cholelithiasis. Spleen, pancreas, and adrenal glands are unremarkable. Bilateral renal cysts and additional subcentimeter hypodensities too small to characterize. No hydronephrosis bilaterally. Underdistended urinary bladder with mild wall thickening, nonspecific. Prostate surgically absent. No evidence of bowel obstruction or acute inflammatory process involving the bowel. Colonic diverticulosis. Appendix unremarkable. BONES: Diffuse osseous sclerotic metastatic lesions throughout the axial and appendicular skeleton, progressed from 06/04/2018 CT examinations. This includes diffuse heterogeneous sclerosis of the T10 vertebral body and multiple lower cervical vertebral bodies, as well as sternal lesion. Appearance is overall similar to most recent nuclear medicine bone scan 01/20/2019. SOFT TISSUES: Small volume fluid in the right inguinal canal.     No evidence of acute aortic intramural hematoma or dissection. Multifocal osseous sclerotic metastatic disease, progressed when compared to prior CT 06/04/2018 and grossly similar to most recent nuclear medicine bone scan. Mucoid impacted airways in left lower lobe with adjacent subtle ground glass opacities; likely represents sequelae of aspiration. Recommend follow up CT in 6 to 8 weeks to ensure clearing. Other chronic/incidental findings as above, not substantially changed.     Hospital Course:  Joseph Phillips is a 58 y.o. male with PMHx of metastatic prostate CA, presents to [facility] with Chest pain, mid sternal x 3 days likely secondary to prostate cancer mets to sternum and T-spine.  ??  1) Chest Pain x 3 days. Pain was non-cardiac sounding by description. Pain was producible on palpation and had new mets to sternum on chest CT. Normal CXR and troponin X 2 negative.  ??  2) Prostate Cancer with New Mets to Sternum and T-spine. He was given scheduled tylenol, diclofenac gel to chest wall, prn oxycodone. Pain was better controlled by discharge. Missed appointment in oncology on the day of admission, he was advised to reschedule follow up and discuss possible referral to radiation oncology for symptomatic management with Dr. Claude Manges.     3) Hyperkalemia (K=5.9). Resolved. No EKG changes. This was likely secondary to potassium supplement that patient has been taking. It improved with hydration, one dose of kayexalate. At discharge, advised to discontinue his potassium supplements. Will need follow up recheck on K+ levels within 1-2 wks.  ??  4) Nausea and Vomiting x several days.  Unclear etiology, denies diarrhea or abdominal pain. CT abd was unremarkable. Lipase WNL. Managed with supportive care, symptoms improved by discharge. ??     Condition at Discharge: stable  Discharge Medications:      Your Medication List      STOP taking these medications    potassium chloride 20 MEQ CR tablet  Commonly known as:  KLOR-CON        START taking these medications    acetaminophen 500 MG tablet  Commonly known as:  TYLENOL  Take 2 tablets (1,000 mg total) by mouth Three (3) times a day for 14 days.     diclofenac sodium 1 % gel  Commonly known as:  VOLTAREN  Apply 2 g topically Four (4) times a day. Chest, area of pain     diphenhydrAMINE 25 mg tablet  Commonly known as:  BENADRYL  Take 0.5 tablets (12.5 mg total) by mouth every six (6) hours as needed for itching.        CHANGE how you take these medications    oxyCODONE 5 MG immediate release tablet  Commonly known as:  ROXICODONE  Take 2 tablets (10 mg total) by mouth every four (4) hours as needed for pain.  What changed:  when to take this        CONTINUE taking these medications    albuterol 90 mcg/actuation inhaler  Commonly known as:  PROVENTIL HFA;VENTOLIN HFA  Inhale 2 puffs every six (6) hours as needed for wheezing.     amLODIPine 10 MG tablet  Commonly known as:  NORVASC  Take 1 tablet (10 mg total) by mouth daily.     busPIRone 5 MG tablet Commonly known as:  BUSPAR  Take 1 tablet (5 mg total) by mouth Three (3) times a day.     CREON 24,000-76,000 -120,000 unit Cpdr delayed release capsule  Generic drug:  pancrelipase (Lip-Prot-Amyl)  Take 48,000 units of lipase by mouth Three (3) times a day with a meal. Take an additional 1 capsule with snacks.     doxycycline 100 MG capsule  Commonly known as:  VIBRAMYCIN  Take 1 capsule (100 mg total) by mouth Two (2) times a day for 14 days.     magic mouthwash oral suspension  Take 10 mL by mouth 4 (four) times a day as needed.     magnesium oxide 400 mg (241.3 mg magnesium) tablet  Commonly known as:  MAG-OX  Take 1 tablet (400 mg total) by mouth Two (2) times a day.     omeprazole 20 MG capsule  Commonly known as:  PriLOSEC  take 2 capsule by mouth once daily     prochlorperazine 10 MG tablet  Commonly known as:  COMPAZINE  Take 1 tablet (10 mg total) by mouth every eight (8) hours as needed.     SPIRIVA WITH HANDIHALER 18 mcg inhalation capsule  Generic drug:  tiotropium  Place 1 capsule (18 mcg total) into inhaler and inhale once daily.     traZODone 50 MG tablet  Commonly known as:  DESYREL  Take 1 tablet (50 mg total) by mouth nightly as needed for sleep.     valACYclovir 500 MG tablet  Commonly known as:  VALTREX  Take 1 tablet (500 mg total) by mouth daily.     XTANDI 40 mg capsule  Generic drug:  enzalutamide  Take 4 capsules (160 mg total) by mouth daily            Pending Test Results:   none    Discharge Instructions:     Other Instructions     Call MD for:  severe uncontrolled pain      Discharge instructions      You were observed in the hospital for pain in your chest. This appears to be coming from metastases of your prostate cancer into the bones in your chest. We have started you on scheduled tylenol and topical diclofenac gel to help the pain. You may also increase your pain medication oxycodone up to every 4 hrs as needed. Please follow up with your oncology doctor to discuss symptom management for this, they may recommend radiation oncology to  help with your pain.    Your potassium levels were high here, we have stopped your potassium supplement. You will need to follow up your potassium levels with your doctor within 1-2 weeks.         Discharge instructions to patient: Call your Specialist doctor and make an appointment to see them (specify):      Specialist: Oncology  Within 1-2 weeks from the time you are discharged from the hospital, discuss possible radiation oncology             Follow Up instructions and Outpatient Referrals     Call MD for:  severe uncontrolled pain      Discharge instructions               I spent greater than 30 minutes in the discharge of this patient.

## 2019-03-11 NOTE — Unmapped (Signed)
Care Management  Initial Transition Planning Assessment              General  Care Manager assessed the patient by : Medical record review, Discussion with Clinical Care team(telephone interview with patient)  Orientation Level: Oriented X4  Who provides care at home?: N/A  Reason for referral: Discharge Planning    Contact/Decision Maker  Extended Emergency Contact Information  Primary Emergency Contact: Mccall,Curtis   [place]  Home Phone: [phone]  Mobile Phone: [phone]  Relation: Brother     Patient [phone]    Legal Next of Kin / Guardian / POA / Advance Directives       Advance Directive (Medical Treatment)  Does patient have an advance directive covering medical treatment?: Patient would not like information., Patient does not have advance directive covering medical treatment.  Reason patient does not have an advance directive covering medical treatment:: Patient does not wish to complete one at this time  Information provided on advance directive:: No  Patient requests assistance:: No    Advance Directive (Mental Health Treatment)  Does patient have an advance directive covering mental health treatment?: Patient would not like information., Patient does not have advance directive covering mental health treatment.  Reason patient does not have an advance directive covering mental health treatment:: Patient does not wish to complete one at this time.    Patient Information  Lives with: Parent(lives with mother)    Type of Residence: Private residence        Location/Detail: [address] [place] [address]     Support Systems: Family Members    Responsibilities/Dependents at home?: No    Home Care services in place prior to admission?: No          Outpatient/Community Resources in place prior to admission: Clinic  Agency detail (Name/Phone #): PCP [facility]    Equipment Currently Used at Home: cane, straight       Currently receiving outpatient dialysis?: No Financial Information       Need for financial assistance?: No(receives SSDI and 93.00 in FS monthly)       Social Determinants of Health  Social Determinants of Health were addressed in provider documentation.  Please refer to patient history.    Discharge Needs Assessment  Concerns to be Addressed: no discharge needs identified, denies needs/concerns at this time    Clinical Risk Factors: Multiple Diagnoses (Chronic), Poor Health Literacy, Lives Alone or Absence of Caregiver to Assist with Discharge and Home Care    Barriers to taking medications: No    Prior overnight hospital stay or ED visit in last 90 days: No    Readmission Within the Last 30 Days: no previous admission in last 30 days         Anticipated Changes Related to Illness: none    Equipment Needed After Discharge: none    Discharge Facility/Level of Care Needs: (home self care)    Readmission  Risk of Unplanned Readmission Score:  %  Predictive Model Details           25% (High) Factors Contributing to Score   Calculated 03/10/2019 17:37 17% Number of active Rx orders is 28   [facility] Risk of Unplanned Readmission Model 15% Number of ED visits in last six months is 3     9% Diagnosis of cancer is present     8% Active antipsychotic Rx order is present     8% ECG/EKG order is present in last 6  months     7% Latest BUN is high (22 mg/dL)     7% Diagnosis of electrolyte disorder is present     6% Charlson Comorbidity Index is 6     Readmitted Within the Last 30 Days? (No if blank)   Patient at risk for readmission?: Yes    Discharge Plan  Screen findings are: Care Manager reviewed the plan of the patient's care with the Multidisciplinary Team. No discharge planning needs identified at this time. Care Manager will continue to manage plan and monitor patient's progress with the team.    Expected Discharge Date: 03/11/19    Expected Transfer from Critical Care: (n/a)    Patient and/or family were provided with choice of facilities / services that are available and appropriate to meet post hospital care needs?: N/A       Initial Assessment complete?: Yes      Adline Potter  March 10, 2019 6:20 PM

## 2019-03-11 NOTE — Unmapped (Signed)
Pt with no falls or injuries this shift, and able to ambulate in room with standby assist.  Bed low and locked, side rails up x 2, and call bell within reach.  Vitals WNL.  No signs/symptoms of infection.  Pt's pain controlled with prn morphine with adequate relief.  Pt tolerating diet.  Will continue to monitor.    Problem: Adult Inpatient Plan of Care  Goal: Plan of Care Review  Outcome: Progressing  Goal: Patient-Specific Goal (Individualization)  Outcome: Progressing  Goal: Absence of Hospital-Acquired Illness or Injury  Outcome: Progressing  Goal: Optimal Comfort and Wellbeing  Outcome: Progressing  Goal: Readiness for Transition of Care  Outcome: Progressing  Goal: Rounds/Family Conference  Outcome: Progressing

## 2019-03-12 NOTE — Unmapped (Signed)
Hi Tera Mater ,    Patient Joseph Phillips contacted the Communication Center to cancel and reschedule their appointment for 03/10/19.  The appointment has not been cancelled.    Cancellation Reason:   no transportation    Thank you,  Christell Faith  [facility]   [phone]

## 2019-03-12 NOTE — Unmapped (Signed)
[name] reports that his [name] is still on hold and he needs to reschedule his appointment with Dr. Claude Manges. We will check back after his appointment, but he knows to call the [facility] if he needs anything.    Regis Bill, PharmD, BCGP, CSP  [facility]  [address]  [place], [place] [address]  ph: [phone]  f: [phone]

## 2019-03-12 NOTE — Unmapped (Signed)
Patient v/s stable, alert and oriented x4 and w/o complaints of nausea/vomiting. Pain controlled with PRN pain medications. Patient comfortably resting in bed. Needle removed from port prior to discharge. Patient verbalizes understanding of all medications given as well as understanding of discharge instructions.     Problem: Adult Inpatient Plan of Care  Goal: Plan of Care Review  Outcome: Resolved  Goal: Patient-Specific Goal (Individualization)  Outcome: Resolved  Goal: Absence of Hospital-Acquired Illness or Injury  Outcome: Resolved  Goal: Optimal Comfort and Wellbeing  Outcome: Resolved  Goal: Readiness for Transition of Care  Outcome: Resolved  Goal: Rounds/Family Conference  Outcome: Resolved     Problem: Fall Injury Risk  Goal: Absence of Fall and Fall-Related Injury  Outcome: Resolved     Problem: Hypertension Comorbidity  Goal: Blood Pressure in Desired Range  Outcome: Resolved

## 2019-03-15 NOTE — Unmapped (Signed)
Joseph Phillips is a 58 y.o. male with PMHx of metastatic prostate CA, presents to [facility] with Chest pain, mid sternal x 3 days likely secondary to prostate cancer mets to sternum and T-spine.  ??  1) Chest Pain x 3 days. Pain was non-cardiac sounding by description. Pain was producible on palpation and had new mets to sternum on chest CT. Normal CXR and troponin X 2 negative.  ??  2) Prostate Cancer with New Mets to Sternum and T-spine. He was given scheduled tylenol, diclofenac gel to chest wall, prn oxycodone. Pain was better controlled by discharge. Missed appointment in oncology on the day of admission, he was advised to reschedule follow up and discuss possible referral to radiation oncology for symptomatic management with Dr. Claude Manges.     3) Hyperkalemia (K=5.9). Resolved. No EKG changes. This was likely secondary to potassium supplement that patient has been taking. It improved with hydration, one dose of kayexalate. At discharge, advised to discontinue his potassium supplements. Will need follow up recheck on K+ levels within 1-2 wks.  ??  4) Nausea and Vomiting x several days.  Unclear etiology, denies diarrhea or abdominal pain. CT abd was unremarkable. Lipase WNL. Managed with supportive care, symptoms improved by discharge.

## 2019-03-15 NOTE — Unmapped (Signed)
CA received VM from [facility] re incontinence supplies for patient. Unable to reach when called back, left VM.

## 2019-03-17 NOTE — Unmapped (Signed)
Joseph Phillips reports Joseph Phillips is discontinued due to side effects of discomfort in shoulders and lower extremities.  Confirming with Dr. Claude Manges and then will disenroll patient from specialty calls.     Per Dr. Claude Manges: Patient had lower extremity swelling and possible cellulitis. ??I asked him to hold Xtandi, and placed on doxycycline. ??He was supposed to come back a week later for reevaluation. ??He ended up in the hospital with chest pain, apparently. ??CT scan suggested stable bony metastases. ??He was rescheduled to 03/12/2019, but canceled that appointment because he could not find transportation. ??My plan was to restart Xtandi unless I was convinced he was having unacceptable side effects. ??We will try again to either get him into the office or interview him over the phone, to see if he should remain on this medication. ??I will give you an update at that time.    [facility] Clinical Assessment & Refill Coordination Note    Joseph Phillips, DOB: [birth year]  Phone: [phone] (home) [phone] (work)    All above HIPAA information was verified with patient.     Specialty Medication(s):   Hematology/Oncology: Joseph Phillips     Current Outpatient Medications   Medication Sig Dispense Refill   ??? acetaminophen (TYLENOL) 500 MG tablet Take 2 tablets (1,000 mg total) by mouth Three (3) times a day for 14 days. 30 tablet 0   ??? albuterol HFA 90 mcg/actuation inhaler Inhale 2 puffs every six (6) hours as needed for wheezing. 1 Inhaler 2   ??? amLODIPine (NORVASC) 10 MG tablet Take 1 tablet (10 mg total) by mouth daily. 30 tablet 11   ??? busPIRone (BUSPAR) 5 MG tablet Take 1 tablet (5 mg total) by mouth Three (3) times a day. 270 tablet 3   ??? diclofenac sodium (VOLTAREN) 1 % gel Apply 2 g topically Four (4) times a day. Chest, area of pain 100 g 0   ??? diphenhydrAMINE (BENADRYL) 25 mg tablet Take 0.5 tablets (12.5 mg total) by mouth every six (6) hours as needed for itching. 30 capsule 0   ??? diphenhydramine HCl (MAGIC MOUTHWASH ORAL) suspension Take 10 mL by mouth 4 (four) times a day as needed. 120 mL 1   ??? enzalutamide (XTANDI) 40 mg capsule Take 4 capsules (160 mg total) by mouth daily 120 capsule 11   ??? magnesium oxide (MAG-OX) 400 mg (241.3 mg magnesium) tablet Take 1 tablet (400 mg total) by mouth Two (2) times a day. 180 tablet 3   ??? omeprazole (PRILOSEC) 20 MG capsule take 2 capsule by mouth once daily 180 capsule 3   ??? oxyCODONE (ROXICODONE) 5 MG immediate release tablet Take 2 tablets (10 mg total) by mouth every four (4) hours as needed for pain.     ??? pancrelipase, Lip-Prot-Amyl, (CREON) 24,000-76,000 -120,000 unit CpDR delayed release capsule Take 48,000 units of lipase by mouth Three (3) times a day with a meal. Take an additional 1 capsule with snacks.     ??? prochlorperazine (COMPAZINE) 10 MG tablet Take 1 tablet (10 mg total) by mouth every eight (8) hours as needed. 30 tablet 11   ??? tiotropium (SPIRIVA WITH HANDIHALER) 18 mcg inhalation capsule Place 1 capsule (18 mcg total) into inhaler and inhale once daily. 90 capsule 3   ??? traZODone (DESYREL) 50 MG tablet Take 1 tablet (50 mg total) by mouth nightly as needed for sleep. 90 tablet 3   ??? valACYclovir (VALTREX) 500 MG tablet Take 1 tablet (500 mg total) by mouth  daily. 90 tablet 3     No current facility-administered medications for this visit.         Changes to medications: Joseph Phillips Reports stopping the following medications: Magnesium and calcium supplements    Allergies   Allergen Reactions   ??? Penicillins      Other reaction(s): UNKNOWN   ??? Tramadol Dizziness       Changes to allergies: No    SPECIALTY MEDICATION ADHERENCE     Xtandi 40 mg: unsure how many days of medicine on hand     Medication Adherence    Support network for adherence:  family member          Specialty medication(s) dose(s) confirmed: Patient reports changes to the regimen as follows: Dr. Claude Manges discontinued medication.     Are there any concerns with adherence? No    Adherence counseling provided? Not needed    CLINICAL MANAGEMENT AND INTERVENTION      Clinical Benefit Assessment:    Do you feel the medicine is effective or helping your condition? Joseph Phillips was experiencing side effects with medication    Clinical Benefit counseling provided? consulted provider regarding clinical benefit concerns    Adverse Effects Assessment:    Are you experiencing any side effects? Yes, patient reports experiencing discomfort in your shoulders and lower extremities. Side effect counseling provided: none provided. He has been discontinued on medication    Are you experiencing difficulty administering your medicine? No    Quality of Life Assessment:    How many days over the past month did your Prostate cancer  keep you from your normal activities? For example, brushing your teeth or getting up in the morning. 0    Have you discussed this with your provider? Not needed    Therapy Appropriateness:    Is therapy appropriate? Pharmacist will consult provider    DISEASE/MEDICATION-SPECIFIC INFORMATION      N/A    PATIENT SPECIFIC NEEDS     ? Does the patient have any physical, cognitive, or cultural barriers? No    ? Is the patient high risk? No     ? Does the patient require a Care Management Plan? No     ? Does the patient require physician intervention or other additional services (i.e. nutrition, smoking cessation, social work)? No      SHIPPING     Specialty Medication(s) to be Shipped:   Hematology/Oncology: none at this time    Other medication(s) to be shipped: none     Changes to insurance: No    Delivery Scheduled: Patient declined refill at this time due to not taking medication at this time.     Medication will be delivered via NA to the confirmed NA address in [facility]    The patient will receive a drug information handout for each medication shipped and additional FDA Medication Guides as required.  Verified that patient has previously received a Conservation officer, historic buildings.    Breck Coons [facility] Specialty Pharmacist

## 2019-03-19 DIAGNOSIS — C61 Malignant neoplasm of prostate: Principal | ICD-10-CM

## 2019-03-19 MED ORDER — OXYCODONE 5 MG TABLET
ORAL_TABLET | ORAL | 0 refills | 0.00000 days | Status: CP | PRN
Start: 2019-03-19 — End: 2019-04-02

## 2019-03-19 NOTE — Unmapped (Signed)
spoke with brother about appt time and date 4/29

## 2019-03-19 NOTE — Unmapped (Signed)
Per Dr. Claude Manges, Joseph Phillips reported not wanting to go back on Xtandi.  It has been discontinued.  [facility] is dis-enrolling pt from specialty calls.  Both pt and Dr. Claude Manges aware we can re-enroll if he chooses to resume medication.    This patient has been disenrolled from the [facility] specialty pharmacy services due to medication discontinuation resulting from side effect intolerance.    Kermit Balo  [facility] Specialty Pharmacist

## 2019-03-19 NOTE — Unmapped (Signed)
Contact the patient.  His legs are doing much better.  He does not want to go back on the [place].  He feels that was causing his swelling as well as shortness of breath.  He is willing to consider chemotherapy.  We will reschedule him to return in 1 month.  He will be due for Xgeva at that time and we will discuss other treatments and recheck PSA.

## 2019-03-19 NOTE — Unmapped (Signed)
Attempted to reach the patient to discuss whether Joseph Phillips should be continued or not.  He did not keep his follow-up appointment after the visit on 02/26/2019.  He was admitted briefly to the hospital for chest pain, but was released on 03/11/2019.  On 03/12/2019, he was unable to keep an appointment due to transportation issues.  Follow-up appointment has not been scheduled.  Pharmacy called saying that they had spoken with the patient and he said he had to stop Xtandi because of side effects.  We held it on 02/26/2019 due to lower extremity swelling and erythema.  It is possible the patient was experiencing cellulitis as well, was placed on antibiotics.  We will try to reach the patient to either reschedule follow-up or discuss whether he can go back on Xtandi or not.  If this agent is failed, his treatment options are limited and he may need to be considered for chemotherapy.

## 2019-03-24 NOTE — Unmapped (Signed)
As directed by Dr. Claude Manges: I have contacted the patient back and clarified with him the following:    ?? She has confirmed that he has stopped taking his Xtandi.    ?? The patient reports that the areas of cellulitis have responded favorably to the antibiotic regimen he was prescribed and are predominantly resolved/healed at this time.    ?? Despite having a follow-up clinic visit with Dr. Claude Manges is tentatively scheduled for 04/21/2019 to discuss his ongoing care plan junction with a repeat dose of denosumab, the patient would like to have this discussion sooner if possible.  ?? The patient is in agreement with having a telehealth encounter with Dr. Claude Manges on Monday, March 29, 2019 at 12 PM.  ?? Should the visit not be needed or if Dr. Claude Manges believes evaluation in clinic would be preferable, the patient will be contacted and the appropriate direction will be given.    Otherwise, the patient expressed verbal understanding and had no other questions or concerns at this time and is agreeable to the plan outlined above.  He is to call the clinic back sooner should he have any other issues.

## 2019-03-26 NOTE — Unmapped (Signed)
03/26/19    Travel Screening Questions Completed.    Travel Screening Questions/Answers:  1). Have you traveled within the last 14 days?: No  2). Do you have new or worsening respiratory symptoms (e.g. cough, difficulty breathing)?: No  3). Have you had close contact with a person with confirmed COVID-19 in the last 14 days before symptoms began?: No      Negative Travel Screen: Patient answered NO to questions 2 and 3. Proceed with current scheduling protocol for your clinic.       The call was handled in the following manner: Documented patient response and encounter closed

## 2019-03-29 ENCOUNTER — Institutional Professional Consult (permissible substitution)
Admit: 2019-03-29 | Discharge: 2019-03-30 | Payer: MEDICAID | Attending: Hematology & Oncology | Primary: Hematology & Oncology

## 2019-03-29 DIAGNOSIS — C61 Malignant neoplasm of prostate: Principal | ICD-10-CM

## 2019-03-29 LAB — CBC W/ AUTO DIFF
BASOPHILS RELATIVE PERCENT: 1.2 %
EOSINOPHILS ABSOLUTE COUNT: 0.1 10*9/L (ref 0.0–0.4)
EOSINOPHILS RELATIVE PERCENT: 1.6 %
HEMATOCRIT: 32.4 % — ABNORMAL LOW (ref 41.0–53.0)
HEMOGLOBIN: 11.2 g/dL — ABNORMAL LOW (ref 13.5–17.5)
LARGE UNSTAINED CELLS: 2 % (ref 0–4)
LYMPHOCYTES ABSOLUTE COUNT: 2.3 10*9/L (ref 1.5–5.0)
LYMPHOCYTES RELATIVE PERCENT: 29.9 %
MEAN CORPUSCULAR HEMOGLOBIN CONC: 34.6 g/dL (ref 31.0–37.0)
MEAN CORPUSCULAR HEMOGLOBIN: 33.5 pg — ABNORMAL LOW (ref 26.0–34.0)
MEAN CORPUSCULAR VOLUME: 96.8 fL (ref 80.0–100.0)
MEAN PLATELET VOLUME: 7.3 fL (ref 7.0–10.0)
MONOCYTES ABSOLUTE COUNT: 0.4 10*9/L (ref 0.2–0.8)
MONOCYTES RELATIVE PERCENT: 5.3 %
NEUTROPHILS ABSOLUTE COUNT: 4.5 10*9/L (ref 2.0–7.5)
NEUTROPHILS RELATIVE PERCENT: 59.7 %
PLATELET COUNT: 322 10*9/L (ref 150–440)
RED BLOOD CELL COUNT: 3.35 10*12/L — ABNORMAL LOW (ref 4.50–5.90)
RED CELL DISTRIBUTION WIDTH: 13.2 % (ref 12.0–15.0)
WBC ADJUSTED: 7.6 10*9/L (ref 4.5–11.0)

## 2019-03-29 LAB — CREATININE
EGFR CKD-EPI AA MALE: >90 mL/min/{1.73_m2} (ref >=60)
EGFR CKD-EPI AA MALE: >90 mL/min/1.73m2 (ref >=60–1.30)

## 2019-03-29 MED ORDER — XTANDI 40 MG CAPSULE
ORAL_CAPSULE | Freq: Every day | ORAL | 2 refills | 0.00000 days | Status: CP
Start: 2019-03-29 — End: 2019-03-30

## 2019-03-29 NOTE — Unmapped (Signed)
[name] here for followup with Dr. Claude Manges.    Weight: Fluctuating a few lbs  Energy Level/Fatigue/Active Level: low - pt walks occasionally, x2 wkly  Elimination/Discharge: No issues reported today  Nausea/Vomitting: None  Appetite/Diet: Fair - goes and comes  Pain/Swelling: Pt reports 8/10 toe pain - taking oxycodone  Skin: Intact    Chemo: On hold  Psychosocial: No needs  Refills of Meds: None    Pt's port accessed - flushed, bld return noted, labs drawn and heplocked. Pt tolerated well.    Notified Dr. Claude Manges.

## 2019-03-29 NOTE — Unmapped (Signed)
Initial Visit Note    Patient Name: Joseph Phillips  Patient Age: 58 y.o.  Encounter Date: 03/29/2019    Referring Physician:     Referred Self  No address on file    Primary Care Provider:  Unknown Foley, MD    Assessment/Plan:    Reason for visit:  Prostate Cancer    Cancer Staging  Prostate cancer (CMS-HCC)  Staging form: Prostate, AJCC 7th Edition  - Clinical: Stage IV (T3a, N0, M1b, PSA: Less than 10, Gleason 7) - Unsigned      --Prostate cancer, stage IV,      -- with progression on Lupron/Zytiga     --07/14/18: Docetaxel/prednisone     --11/11/2018: Neutropenic fever, with possible UTI and oral mucositis     -- 02/03/19: [name];  Held 03/01/19 for LE swelling/cellulitis.  --Hypokalemia  --Hypomagnesemia  --Leg cramps and pain, exacerbated by electrolyte disturbances      --03/01/19: Skin changes in the lower extremities of uncertain etiology.  --Teeth in poor repair, awaiting dental correction  --Hypertension  --Lower extremity swelling, exacerbated at night; improved on triamterene-HCTZ  -- OSA; positive study for both central and obstructive apnea; CPAP trial recommended, 07/23/2018; deferred by the patient  --History of pancreatitis with pancreatic insufficiency; on Creon      Recommendation  -- The patient will restart Xtandi at 1/2 dose; we will plan to increase if tolerated.  -- He received Xgeva today.   -- Will plan to see back in 1 month. To consider dose escalation of Xtandi  -- Admonished to qui smoking.      I have reviewed the laboratory, pathology, and radiology reports in detail and discussed findings with the patient    History of Present Illness:     Joseph Phillips is a 58 y.o. male who is seen in consultation at the request of Self, Referred for an evaluation of Prostate Cancer. The patient was last here about 1 month ago.  When he was here, he had probable cellulitis of the lower extremities, and swelling.   He was placed on doxycycline and held Robards, and reported (at the time), that the redness and pain were much improved.  He was admitted to the hospital a week later, on 03/10/19, with chest pain.  A CTA of the chest showed bone mets, progressed since 05/2018,  But likely similar to a recent bone scan (01/20/29).  [name] was stopped, and has not been restarted.  The leg swelling he complained of when last here has resolved.   He is having pain in the back and both shoulders.  The pain is about the same as when he was on Xtandi.  He is otherwise without complaint; he denies anorexia, dyspnea, bleeding, difficulty passing urine.  He takes oxycodone for the pain, and this is giving adequate relief.      Oncology History:  Patient was originally diagnosed with prostate cancer in 2006.  He underwent robot-assisted prostatectomy.  He did well until May 2018, when his PSA rose to a high of about 45, and he was started on Lupron, initially with Casodex, and then with apparatus.  His PSA fell and remained low until recently when he was noted to be rising from a low of 0.82 on  01/01/18, to 3.99 on 06/29/2018.  He was instructed to hold Zytiga.  Since holding Zytiga he is noted swelling in his ankles.  The swelling comes on overnight, improves during the course the day.  He was evaluated with Doppler  studies on 06/29/2018 which showed no evidence of blood clots.  He is still bothered by the swelling.  Restaging studies done on 06/04/2018 including bone scan, and CT of the chest abdomen pelvis, showed sclerotic changes throughout the axial skeleton, and involvement also of scapula, some ribs and pelvis.  The sites were new since 2016, and also probably new since the CT stone study in May 2018.  Bone scan also showed some progression in disease by report, compared to 2018.  He has developed pain in the back of his calves, for which he takes oxycodone (Tylenol and ibuprofen were ineffective).  The pain followed prolonged episodes of cramping, felt to be related to low magnesium and potassium.  About 6 months ago he had shingles in a left L2 distribution.           Prostate cancer (CMS-HCC)    12/02/2008 Biopsy     Prostate, left apex, needle biopsy  - Adenocarcinoma, Gleason combined score 6 (3+3), 2 of 2 cores involved, tumor  approximately 2 mm in greatest diameter, approximately 10% of tissue involved by  carcinoma.  - See comment    E: ??Prostate, left mid, needle biopsy  - Adenocarcinoma, Gleason combined score 6 (3+3), 1 of 2 cores involved, tumor  approximately 2 mm in greatest diameter, approximately 5% of tissue involved by  carcinoma.  - See comment    F: ??Prostate, left base, needle biopsy  - Adenocarcinoma, Gleason combined score 6 (3+3), 1 of 2 cores involved, tumor  approximately 1 mm in greatest diameter, approximately 5% of tissue involved by  carcinoma.      01/24/2009 Surgery     Prostate, robot assisted laparoscopic prostatectomy  Tumor histologic type: Prostatic adenocarcinoma  Tumor grade (Gleason system): Gleason pattern 7 (3+4)  Approximate percentage of prostate involved by tumor: 40%  Extent of tumor:  Location of tumor involvement in prostate: right and left lobes, anterior  and posterior, apex to base  Confinement/non-confinement of tumor within the prostatic capsule: ??not  confined  Location of extracapsular tumor involvement: ??left prostate toward the base  (ie. block A22)  Type of extracapsular tissue involved within tumor: ?? adipose  tissue/perineural tissue  Angiolymphatic space invasion: not identified  Seminal vesicles: negative  Vas deferens: negative  Histologic assessment of surgical margins: positive margin along a 3 mm  face in the left prostate base (block A25)    Other significant findings: High grade prostatic intraepithelial  Neoplasm;  Lymph nodes: Not applicable  AJCC Staging: pT3a pNx      08/30/2013 Initial Diagnosis     Prostate cancer (CMS-HCC)      04/30/2017 -  Chemotherapy     Lupron 22.5 mg; with bicalutamide x  month      08/01/2017 - 06/30/2018 Chemotherapy     Abiaterone 1000 mg daily; decrease to 750 mg daily at 10/2018;      10/30/2017 -  Cancer Staged     Starta: TP53, TMPSSR  -EGR fusion; no other abnormalities identified      01/14/2018 -  Cancer Staged     CT Chest:  Numerous sclerotic metastases of the thoracic spine, left scapula  and multiple ribs.  3. Coronary artery and aortic atherosclerosis (ICD10-I70.0).  4. Previously demonstrated left renal mass is not included in the  field of view.      06/04/2018 -  Cancer Staged     CT CAP:  Scattered diffuse sclerotic metastatic lesions are seen throughout the spine as well as  the left scapula; some of the lesions are new since prior CT dated 04/20/2015.  - No intrathoracic metastatic disease.  Interval progression of osseous metastases throughout the spine and pelvis compared to 04/22/2017.  -- Cholelithiasis.  -- Hepatic steatosis.      06/04/2018 -  Cancer Staged     Bone scan:  Increased radiotracer uptake throughout the spine, sacrum, and left ilium, consistent with numerous metastases better evaluated on same-day CT. Unchanged metastasis to the left proximal humerus.      07/14/2018 -  Chemotherapy     OP PROSTATE DOCETAXEL/PREDNISONE  Docetaxel 75 mg/m2 evry 21 days      01/07/2019 -  Cancer Staged     CTA Chest:  No evidence of pulmonary embolism.  2. Occlusion of the medial basilar segmental bronchus of the left  lower lobe, nonspecific though could be seen in the setting of  aspiration. Otherwise, no acute findings.  3. Apical predominant paraseptal emphysematous change with suspected  mild progression of smoking related fibrosis with subpleural  reticulation but without evidence of honeycombing. Emphysema  (ICD10-J43.9).  4. Progression of osseous metastatic disease since 11/2017. Detailed  above. No definitive pathologic compression fractures.  5. Hepatic steatosis. 6. Coronary artery calcifications. Aortic Atherosclerosis      01/20/2019 -  Cancer Staged     NM Bone scan: Redemonstrated multiple abnormal foci throughout the appendicular axial skeleton, with interval increase in the extent of the T10 vertebral body lesion and new lesions involving the manubrium, left fifth and seventh ribs. This is most consistent with progression of osseous metastatic disease.      03/10/2019 -  Cancer Staged     CTA chest:  No evidence of acute aortic intramural hematoma or dissection.  Multifocal osseous sclerotic metastatic disease, progressed when compared to prior CT 06/04/2018 and grossly similar to most recent nuclear medicine bone scan.  Mucoid impacted airways in left lower lobe with adjacent subtle ground glass opacities; likely represents sequelae of aspiration. Recommend follow up CT in 6 to 8 weeks to ensure clearing.  Other chronic/incidental findings as above, not substantially changed.         Past Medical History:   Diagnosis Date   ??? Hx of radiation therapy    ??? Hypertension    ??? Prostate cancer (CMS-HCC)       Past Surgical History:   Procedure Laterality Date   ??? IR INSERT PORT AGE GREATER THAN 5 YRS  07/15/2018    IR INSERT PORT AGE GREATER THAN 5 YRS 07/15/2018 Carolin Coy, MD [facility]   ??? PR COLSC FLX W/RMVL OF TUMOR POLYP LESION SNARE TQ N/A 08/16/2015    Procedure: COLONOSCOPY FLEX; W/REMOV TUMOR/LES BY SNARE;  Surgeon: Gwen Pounds, MD;  Location: [facility];  Service: Gastroenterology   ??? PR COLSC FLX WITH DIRECTED SUBMUCOSAL NJX ANY SBST N/A 08/16/2015    Procedure: COLONOSCOPY, FLEXIBLE, PROXIMAL TO SPLENIC FLEXURE; WITH DIRECTED SUBMUCOSAL INJECTION(S), ANY SUBSTANCE;  Surgeon: Gwen Pounds, MD;  Location: [facility];  Service: Gastroenterology   ??? PROSTATE SURGERY          Family History   Problem Relation Age of Onset   ??? Melanoma Neg Hx    ??? Basal cell carcinoma Neg Hx    ??? Squamous cell carcinoma Neg Hx       No family status information on file.     Social History     Occupational History   ???  Not on file   Tobacco Use   ??? Smoking status: Current Some Day Smoker     Packs/day: 0.20     Years: 21.00 Pack years: 4.20     Types: Cigarettes     Start date: 05/09/1994   ??? Smokeless tobacco: Never Used   ??? Tobacco comment: smoking anywhere from 0-3 cigarettes daily   Substance and Sexual Activity   ??? Alcohol use: Yes     Alcohol/week: 2.0 standard drinks     Types: 2 Cans of beer per week   ??? Drug use: No   ??? Sexual activity: Not Currently       Allergies   Allergen Reactions   ??? Penicillins      Other reaction(s): UNKNOWN   ??? Tramadol Dizziness       Current Outpatient Medications   Medication Sig Dispense Refill   ??? albuterol HFA 90 mcg/actuation inhaler Inhale 2 puffs every six (6) hours as needed for wheezing. 1 Inhaler 2   ??? amLODIPine (NORVASC) 10 MG tablet Take 1 tablet (10 mg total) by mouth daily. 30 tablet 11   ??? busPIRone (BUSPAR) 5 MG tablet Take 1 tablet (5 mg total) by mouth Three (3) times a day. 270 tablet 3   ??? diclofenac sodium (VOLTAREN) 1 % gel Apply 2 g topically Four (4) times a day. Chest, area of pain 100 g 0   ??? diphenhydrAMINE (BENADRYL) 25 mg tablet Take 0.5 tablets (12.5 mg total) by mouth every six (6) hours as needed for itching. 30 capsule 0   ??? diphenhydramine HCl (MAGIC MOUTHWASH ORAL) suspension Take 10 mL by mouth 4 (four) times a day as needed. 120 mL 1   ??? omeprazole (PRILOSEC) 20 MG capsule take 2 capsule by mouth once daily 180 capsule 3   ??? oxyCODONE (ROXICODONE) 5 MG immediate release tablet Take 2 tablets (10 mg total) by mouth every four (4) hours as needed for pain. 100 tablet 0   ??? pancrelipase, Lip-Prot-Amyl, (CREON) 24,000-76,000 -120,000 unit CpDR delayed release capsule Take 48,000 units of lipase by mouth Three (3) times a day with a meal. Take an additional 1 capsule with snacks.     ??? prochlorperazine (COMPAZINE) 10 MG tablet Take 1 tablet (10 mg total) by mouth every eight (8) hours as needed. 30 tablet 11   ??? tiotropium (SPIRIVA WITH HANDIHALER) 18 mcg inhalation capsule Place 1 capsule (18 mcg total) into inhaler and inhale once daily. 90 capsule 3   ??? traZODone (DESYREL) 50 MG tablet Take 1 tablet (50 mg total) by mouth nightly as needed for sleep. 90 tablet 3   ??? magnesium oxide (MAG-OX) 400 mg (241.3 mg magnesium) tablet Take 1 tablet (400 mg total) by mouth Two (2) times a day. (Patient not taking: Reported on 03/29/2019) 180 tablet 3   ??? valACYclovir (VALTREX) 500 MG tablet Take 1 tablet (500 mg total) by mouth daily. (Patient not taking: Reported on 03/29/2019) 90 tablet 3     No current facility-administered medications for this visit.        The following portions of the patient's history were reviewed and updated as appropriate: allergies, current medications, past family history, past medical history, past social history, past surgical history and problem list.     Review of Systems   Constitutional: Positive for fatigue. Negative for appetite change, chills, diaphoresis, fever and unexpected weight change.   HENT:   Negative for mouth sores, nosebleeds and trouble swallowing.    Eyes:  Negative for eye problems.   Respiratory: Negative for chest tightness, cough, hemoptysis and shortness of breath.    Cardiovascular: Negative for chest pain and leg swelling.   Gastrointestinal: Negative for abdominal pain, blood in stool, constipation, diarrhea and nausea.   Genitourinary: Negative for difficulty urinating and hematuria.    Musculoskeletal: Positive for back pain and myalgias. Negative for arthralgias.   Skin: Negative for rash.   Neurological: Negative for extremity weakness, headaches and seizures.   Hematological: Negative for adenopathy.   Psychiatric/Behavioral: Positive for depression. Negative for confusion.   All other systems reviewed and are negative.        Vital Signs for this encounter:  BSA: 1.87 meters squared  BP 118/67  - Pulse 81  - Temp 36.6 ??C (97.9 ??F) (Temporal)  - Resp 18  - Wt 72.9 kg (160 lb 11.2 oz)  - SpO2 100%  - BMI 24.43 kg/m??     Physical Exam   Constitutional: He is oriented to person, place, and time.   Ill-appearing African-American gentleman, in mild distress.   Neck: No JVD present. No thyromegaly present.   Cardiovascular: Normal rate, regular rhythm and normal heart sounds.   Pulmonary/Chest: Effort normal and breath sounds normal.   Abdominal: Bowel sounds are normal. He exhibits no distension and no mass. There is no abdominal tenderness.   Musculoskeletal:         General: No tenderness.      Comments: There is trace  edema in bilateral feet and lower extremities about midway up the calf.  Skin is bound down.  No erythema, tenderness.   Lymphadenopathy:     He has no cervical adenopathy.   No adenopathy neck, supraclavicular, axillary inguinal regions.   Neurological: He is alert and oriented to person, place, and time.   Skin: No rash noted.   Psychiatric: He has a normal mood and affect. His behavior is normal.       Karnofsky/Lansky Performance Status  80, Normal activity with effort; some signs or symptoms of disease (ECOG equivalent 1)    Results:    WBC   Date Value Ref Range Status   03/10/2019 8.7 4.5 - 11.0 10*9/L Final   04/17/2012 7.6 4.5 - 11.0 x10 9th/L Final     HGB   Date Value Ref Range Status   03/10/2019 11.8 (L) 13.5 - 17.5 g/dL Final   16/09/9603 54.0 13.5 - 17.5 G/DL Final     HCT   Date Value Ref Range Status   03/10/2019 34.2 (L) 41.0 - 53.0 % Final   04/17/2012 39.2 (L) 41.0 - 53.0 % Final     Platelet   Date Value Ref Range Status   03/10/2019 224 150 - 440 10*9/L Final   04/17/2012 242 150 - 440 x10 9th/L Final     Creatinine Whole Blood, POC   Date Value Ref Range Status   07/25/2014 1.6 (H) 0.8 - 1.4 mg/dL Final     Creatinine, Whole Blood   Date Value Ref Range Status   07/25/2014 1.4 0.8 - 1.4 mg/dL Final     Creatinine   Date Value Ref Range Status   03/11/2019 0.73 0.70 - 1.30 mg/dL Final   98/10/9146 8.29 0.70 - 1.30 mg/dL Final     AST   Date Value Ref Range Status   03/11/2019 57 (H) 19 - 55 U/L Final   09/11/2011 38 19 - 55 U/L Final     PSA Diagnostic   Date  Value Ref Range Status   09/28/2014 4.4 (H) 0.0 - 4.0 ng/mL Final   05/30/2014 5.0 (H) 0.0 - 4.0 ng/mL Final   03/23/2014 4.6 (H) 0.0 - 4.0 ng/mL Final   10/04/2013 3.7 0.0 - 4.0 ng/mL Final   06/24/2013 2.6 0.0 - 4.0 ng/mL Final   01/18/2013 1.6 0.0 - 4.0 NG/ML Final   09/28/2012 1.5 0.0 - 4.0 NG/ML Final   07/03/2012 1.0 0.0 - 4.0 NG/ML Final   12/30/2011 0.5 0.0 - 4.0 NG/ML Final   07/15/2011 0.3 0.0 - 4.0 NG/ML Final   04/08/2011 0.3 0.0 - 4.0 NG/ML Final

## 2019-03-29 NOTE — Unmapped (Signed)
Change in Xtandi dosage decrease. Co-pay $3.00. Provider's comment, Trying on half dose to document tolerance. Will plan to increase from 80 to 120 mg after 1-2 months if tolerated

## 2019-03-29 NOTE — Unmapped (Signed)
Addended by: Durene Cal on: 03/29/2019 01:56 PM     Modules accepted: Orders

## 2019-03-30 MED ORDER — ENZALUTAMIDE 40 MG CAPSULE
ORAL_CAPSULE | Freq: Every day | ORAL | 2 refills | 0 days | Status: CP
Start: 2019-03-30 — End: 2019-05-27
  Filled 2019-04-19: qty 60, 30d supply, fill #0

## 2019-03-30 NOTE — Unmapped (Signed)
Hi,    Patient Joseph Phillips called requesting a medication refill for the following:    ? Medication: Roxicodone  ? Dosage: 5 MG  ? Days left of medication: 2  ? Pharmacy: [facility]     The expected turnaround time is 3-4 business days       Check Indicates criteria has been reviewed and confirmed with the patient:    [x]  Preferred Name   [x]  DOB and/or MR#  [x]  Preferred Contact Method  [x]  Phone Number(s)   [x]  Preferred Pharmacy   []  MyChart     Thank you,  Drema Balzarine  [facility]  [phone]

## 2019-03-31 NOTE — Unmapped (Signed)
Hi,    Patient Joseph Phillips called requesting a medication refill for the following:    ? Medication: Roxicodone  ? Dosage: 5MG   ? Days left of medication: 0  ? Pharmacy: [facility]    The expected turnaround time is 3-4 business days       Check Indicates criteria has been reviewed and confirmed with the patient:    [x]  Preferred Name   [x]  DOB and/or MR#  [x]  Preferred Contact Method  [x]  Phone Number(s)   [x]  Preferred Pharmacy   []  MyChart     Thank you,  Drema Balzarine  [facility]  [phone]

## 2019-04-01 NOTE — Unmapped (Signed)
CA spoke with Victorino Dike at [facility] re incontinence supplies for patient. She reports that patient is currently buying incontinence supplies out of pocket and is interested to know whether he can get them covered by insurance. She does not know whether he has a preferred medical supply company.     [facility] in [place] (ph. [phone], f. [phone]) accepts patient's insurance and is close to home. They will need the following:   - Order for incontinence supplies   - Demographics/insurance info  - F2F office notes (1/28 HFU notes are fine)    Victorino Dike is also wondering whether we are able to get patient connected with dietitian. She reports that undergoing chemotherapy has significantly affected patient's appetite and that he could benefit from seeing a dietitian.

## 2019-04-01 NOTE — Unmapped (Signed)
Received VM from [place]. Unable to reach [facility], left VM. CA will try again tomorrow.

## 2019-04-02 MED ORDER — OXYCODONE 5 MG TABLET
ORAL_TABLET | ORAL | 0 refills | 0.00000 days | Status: CP | PRN
Start: 2019-04-02 — End: 2019-04-21

## 2019-04-02 NOTE — Unmapped (Signed)
I have printed information out and have faxed it to [employer].     Thank you,    Irma D.

## 2019-04-02 NOTE — Unmapped (Signed)
Order for incontinence supplies sent to [facility]. Message sent to Jhonnie Garner for assistance in faxing insurance information and visit notes.

## 2019-04-02 NOTE — Unmapped (Signed)
Unable to reach [facility] to follow up on order for incontinence supplies. Left VM with request to return call.

## 2019-04-05 NOTE — Unmapped (Signed)
CA received VM from Rosey Bath at [facility] stating that order for incontinence supplies has been received and processed. They will reach out to patient to let him know that supplies are ready to be picked up.

## 2019-04-09 ENCOUNTER — Institutional Professional Consult (permissible substitution): Admit: 2019-04-09 | Discharge: 2019-04-10 | Payer: MEDICAID | Attending: Registered" | Primary: Registered"

## 2019-04-09 DIAGNOSIS — C61 Malignant neoplasm of prostate: Principal | ICD-10-CM

## 2019-04-09 NOTE — Unmapped (Signed)
Unable to reach patient.

## 2019-04-14 NOTE — Unmapped (Deleted)
Initial Visit Note    Patient Name: Joseph Phillips  Patient Age: 58 y.o.  Encounter Date: 04/21/2019    Referring Physician:     Towanda Malkin, MD  [address]  [phone]  [place], [place] [address]    Primary Care Provider:  Unknown Foley, MD    Assessment/Plan:    Reason for visit:  Prostate Cancer    Cancer Staging  Prostate cancer (CMS-HCC)  Staging form: Prostate, AJCC 7th Edition  - Clinical: Stage IV (T3a, N0, M1b, PSA: Less than 10, Gleason 7) - Unsigned      --Prostate cancer, stage IV,      -- with progression on Lupron/Zytiga     --07/14/18: Docetaxel/prednisone     --11/11/2018: Neutropenic fever, with possible UTI and oral mucositis     -- 02/03/19: [name];  Held 03/01/19 for LE swelling/cellulitis; restart 03/29/19 at 80 mg qD  --Hypokalemia  --Hypomagnesemia  --Leg cramps and pain, exacerbated by electrolyte disturbances      --03/01/19: Skin changes in the lower extremities of uncertain etiology.  --Teeth in poor repair, awaiting dental correction  --Hypertension  --Lower extremity swelling, exacerbated at night; improved on triamterene-HCTZ  -- OSA; positive study for both central and obstructive apnea; CPAP trial recommended, 07/23/2018; deferred by the patient  --History of pancreatitis with pancreatic insufficiency; on Creon      Recommendation  -- The patient will restart Xtandi at 1/2 dose; we will plan to increase if tolerated.  -- He received Xgeva today.   -- Will plan to see back in 1 month. To consider dose escalation of Xtandi  -- Admonished to qui smoking.      I have reviewed the laboratory, pathology, and radiology reports in detail and discussed findings with the patient    History of Present Illness:     Joseph Phillips is a 58 y.o. male who is seen in consultation at the request of Lonn Georgia* for an evaluation of Prostate Cancer. The patient was last here about 1 month ago.  When he was here, he had probable cellulitis of the lower extremities, and swelling.   He was placed on doxycycline and held Pierson, and reported (at the time), that the redness and pain were much improved.  He was admitted to the hospital a week later, on 03/10/19, with chest pain.  A CTA of the chest showed bone mets, progressed since 05/2018,  But likely similar to a recent bone scan (01/20/29).  [name] was stopped, and has not been restarted.  The leg swelling he complained of when last here has resolved.   He is having pain in the back and both shoulders.  The pain is about the same as when he was on Xtandi.  He is otherwise without complaint; he denies anorexia, dyspnea, bleeding, difficulty passing urine.  He takes oxycodone for the pain, and this is giving adequate relief.      Oncology History:  Patient was originally diagnosed with prostate cancer in 2006.  He underwent robot-assisted prostatectomy.  He did well until May 2018, when his PSA rose to a high of about 45, and he was started on Lupron, initially with Casodex, and then with apparatus.  His PSA fell and remained low until recently when he was noted to be rising from a low of 0.82 on  01/01/18, to 3.99 on 06/29/2018.  He was instructed to hold Zytiga.  Since holding Zytiga he is noted swelling in his ankles.  The  swelling comes on overnight, improves during the course the day.  He was evaluated with Doppler studies on 06/29/2018 which showed no evidence of blood clots.  He is still bothered by the swelling.  Restaging studies done on 06/04/2018 including bone scan, and CT of the chest abdomen pelvis, showed sclerotic changes throughout the axial skeleton, and involvement also of scapula, some ribs and pelvis.  The sites were new since 2016, and also probably new since the CT stone study in May 2018.  Bone scan also showed some progression in disease by report, compared to 2018.  He has developed pain in the back of his calves, for which he takes oxycodone (Tylenol and ibuprofen were ineffective).  The pain followed prolonged episodes of cramping, felt to be related to low magnesium and potassium.  About 6 months ago he had shingles in a left L2 distribution.           Prostate cancer (CMS-HCC)    12/02/2008 Biopsy     Prostate, left apex, needle biopsy  - Adenocarcinoma, Gleason combined score 6 (3+3), 2 of 2 cores involved, tumor  approximately 2 mm in greatest diameter, approximately 10% of tissue involved by  carcinoma.  - See comment    E: ??Prostate, left mid, needle biopsy  - Adenocarcinoma, Gleason combined score 6 (3+3), 1 of 2 cores involved, tumor  approximately 2 mm in greatest diameter, approximately 5% of tissue involved by  carcinoma.  - See comment    F: ??Prostate, left base, needle biopsy  - Adenocarcinoma, Gleason combined score 6 (3+3), 1 of 2 cores involved, tumor  approximately 1 mm in greatest diameter, approximately 5% of tissue involved by  carcinoma.      01/24/2009 Surgery     Prostate, robot assisted laparoscopic prostatectomy  Tumor histologic type: Prostatic adenocarcinoma  Tumor grade (Gleason system): Gleason pattern 7 (3+4)  Approximate percentage of prostate involved by tumor: 40%  Extent of tumor:  Location of tumor involvement in prostate: right and left lobes, anterior  and posterior, apex to base  Confinement/non-confinement of tumor within the prostatic capsule: ??not  confined  Location of extracapsular tumor involvement: ??left prostate toward the base  (ie. block A22)  Type of extracapsular tissue involved within tumor: ?? adipose  tissue/perineural tissue  Angiolymphatic space invasion: not identified  Seminal vesicles: negative  Vas deferens: negative  Histologic assessment of surgical margins: positive margin along a 3 mm  face in the left prostate base (block A25)    Other significant findings: High grade prostatic intraepithelial  Neoplasm;  Lymph nodes: Not applicable  AJCC Staging: pT3a pNx      08/30/2013 Initial Diagnosis     Prostate cancer (CMS-HCC)      04/30/2017 -  Chemotherapy     Lupron 22.5 mg; with bicalutamide x  month      08/01/2017 - 06/30/2018 Chemotherapy     Abiaterone 1000 mg daily; decrease to 750 mg daily at 10/2018;      10/30/2017 -  Cancer Staged     Starta: TP53, TMPSSR  -EGR fusion; no other abnormalities identified      01/14/2018 -  Cancer Staged     CT Chest:  Numerous sclerotic metastases of the thoracic spine, left scapula  and multiple ribs.  3. Coronary artery and aortic atherosclerosis (ICD10-I70.0).  4. Previously demonstrated left renal mass is not included in the  field of view.      06/04/2018 -  Cancer Staged  CT CAP:  Scattered diffuse sclerotic metastatic lesions are seen throughout the spine as well as the left scapula; some of the lesions are new since prior CT dated 04/20/2015.  - No intrathoracic metastatic disease.  Interval progression of osseous metastases throughout the spine and pelvis compared to 04/22/2017.  -- Cholelithiasis.  -- Hepatic steatosis.      06/04/2018 -  Cancer Staged     Bone scan:  Increased radiotracer uptake throughout the spine, sacrum, and left ilium, consistent with numerous metastases better evaluated on same-day CT. Unchanged metastasis to the left proximal humerus.      07/14/2018 -  Chemotherapy     OP PROSTATE DOCETAXEL/PREDNISONE  Docetaxel 75 mg/m2 evry 21 days      01/07/2019 -  Cancer Staged     CTA Chest:  No evidence of pulmonary embolism.  2. Occlusion of the medial basilar segmental bronchus of the left  lower lobe, nonspecific though could be seen in the setting of  aspiration. Otherwise, no acute findings.  3. Apical predominant paraseptal emphysematous change with suspected  mild progression of smoking related fibrosis with subpleural  reticulation but without evidence of honeycombing. Emphysema  (ICD10-J43.9).  4. Progression of osseous metastatic disease since 11/2017. Detailed  above. No definitive pathologic compression fractures.  5. Hepatic steatosis. 6. Coronary artery calcifications. Aortic Atherosclerosis      01/20/2019 -  Cancer Staged     NM Bone scan: Redemonstrated multiple abnormal foci throughout the appendicular axial skeleton, with interval increase in the extent of the T10 vertebral body lesion and new lesions involving the manubrium, left fifth and seventh ribs. This is most consistent with progression of osseous metastatic disease.      03/10/2019 -  Cancer Staged     CTA chest:  No evidence of acute aortic intramural hematoma or dissection.  Multifocal osseous sclerotic metastatic disease, progressed when compared to prior CT 06/04/2018 and grossly similar to most recent nuclear medicine bone scan.  Mucoid impacted airways in left lower lobe with adjacent subtle ground glass opacities; likely represents sequelae of aspiration. Recommend follow up CT in 6 to 8 weeks to ensure clearing.  Other chronic/incidental findings as above, not substantially changed.         Past Medical History:   Diagnosis Date   ??? Hx of radiation therapy    ??? Hypertension    ??? Prostate cancer (CMS-HCC)       Past Surgical History:   Procedure Laterality Date   ??? IR INSERT PORT AGE GREATER THAN 5 YRS  07/15/2018    IR INSERT PORT AGE GREATER THAN 5 YRS 07/15/2018 Carolin Coy, MD [facility]   ??? PR COLSC FLX W/RMVL OF TUMOR POLYP LESION SNARE TQ N/A 08/16/2015    Procedure: COLONOSCOPY FLEX; W/REMOV TUMOR/LES BY SNARE;  Surgeon: Gwen Pounds, MD;  Location: [facility];  Service: Gastroenterology   ??? PR COLSC FLX WITH DIRECTED SUBMUCOSAL NJX ANY SBST N/A 08/16/2015    Procedure: COLONOSCOPY, FLEXIBLE, PROXIMAL TO SPLENIC FLEXURE; WITH DIRECTED SUBMUCOSAL INJECTION(S), ANY SUBSTANCE;  Surgeon: Gwen Pounds, MD;  Location: [facility];  Service: Gastroenterology   ??? PROSTATE SURGERY          Family History   Problem Relation Age of Onset   ??? Melanoma Neg Hx    ??? Basal cell carcinoma Neg Hx    ??? Squamous cell carcinoma Neg Hx       No family status information  on file.     Social History     Occupational History   ??? Not on file   Tobacco Use   ??? Smoking status: Current Some Day Smoker     Packs/day: 0.20     Years: 21.00     Pack years: 4.20     Types: Cigarettes     Start date: 05/09/1994   ??? Smokeless tobacco: Never Used   ??? Tobacco comment: smoking anywhere from 0-3 cigarettes daily   Substance and Sexual Activity   ??? Alcohol use: Yes     Alcohol/week: 2.0 standard drinks     Types: 2 Cans of beer per week   ??? Drug use: No   ??? Sexual activity: Not Currently       Allergies   Allergen Reactions   ??? Penicillins      Other reaction(s): UNKNOWN   ??? Tramadol Dizziness       Current Outpatient Medications   Medication Sig Dispense Refill   ??? albuterol HFA 90 mcg/actuation inhaler Inhale 2 puffs every six (6) hours as needed for wheezing. 1 Inhaler 2   ??? amLODIPine (NORVASC) 10 MG tablet Take 1 tablet (10 mg total) by mouth daily. 30 tablet 11   ??? busPIRone (BUSPAR) 5 MG tablet Take 1 tablet (5 mg total) by mouth Three (3) times a day. 270 tablet 3   ??? diclofenac sodium (VOLTAREN) 1 % gel Apply 2 g topically Four (4) times a day. Chest, area of pain 100 g 0   ??? diphenhydrAMINE (BENADRYL) 25 mg tablet Take 0.5 tablets (12.5 mg total) by mouth every six (6) hours as needed for itching. 30 capsule 0   ??? diphenhydramine HCl (MAGIC MOUTHWASH ORAL) suspension Take 10 mL by mouth 4 (four) times a day as needed. 120 mL 1   ??? enzalutamide (XTANDI) 40 mg capsule Take 2 capsules (80 mg total) by mouth daily. 60 capsule 2   ??? magnesium oxide (MAG-OX) 400 mg (241.3 mg magnesium) tablet Take 1 tablet (400 mg total) by mouth Two (2) times a day. (Patient not taking: Reported on 03/29/2019) 180 tablet 3   ??? omeprazole (PRILOSEC) 20 MG capsule take 2 capsule by mouth once daily 180 capsule 3   ??? oxyCODONE (ROXICODONE) 5 MG immediate release tablet Take 2 tablets (10 mg total) by mouth every four (4) hours as needed for pain. 100 tablet 0   ??? pancrelipase, Lip-Prot-Amyl, (CREON) 24,000-76,000 -120,000 unit CpDR delayed release capsule Take 48,000 units of lipase by mouth Three (3) times a day with a meal. Take an additional 1 capsule with snacks.     ??? prochlorperazine (COMPAZINE) 10 MG tablet Take 1 tablet (10 mg total) by mouth every eight (8) hours as needed. 30 tablet 11   ??? tiotropium (SPIRIVA WITH HANDIHALER) 18 mcg inhalation capsule Place 1 capsule (18 mcg total) into inhaler and inhale once daily. 90 capsule 3   ??? traZODone (DESYREL) 50 MG tablet Take 1 tablet (50 mg total) by mouth nightly as needed for sleep. 90 tablet 3   ??? valACYclovir (VALTREX) 500 MG tablet Take 1 tablet (500 mg total) by mouth daily. (Patient not taking: Reported on 03/29/2019) 90 tablet 3     No current facility-administered medications for this visit.        The following portions of the patient's history were reviewed and updated as appropriate: allergies, current medications, past family history, past medical history, past social history, past surgical history and problem list.  Review of Systems   Constitutional: Positive for fatigue. Negative for appetite change, chills, diaphoresis, fever and unexpected weight change.   HENT:   Negative for mouth sores, nosebleeds and trouble swallowing.    Eyes: Negative for eye problems.   Respiratory: Negative for chest tightness, cough, hemoptysis and shortness of breath.    Cardiovascular: Negative for chest pain and leg swelling.   Gastrointestinal: Negative for abdominal pain, blood in stool, constipation, diarrhea and nausea.   Genitourinary: Negative for difficulty urinating and hematuria.    Musculoskeletal: Positive for back pain and myalgias. Negative for arthralgias.   Skin: Negative for rash.   Neurological: Negative for extremity weakness, headaches and seizures.   Hematological: Negative for adenopathy.   Psychiatric/Behavioral: Positive for depression. Negative for confusion.   All other systems reviewed and are negative.        Vital Signs for this encounter:  BSA: There is no height or weight on file to calculate BSA.  There were no vitals taken for this visit.    Physical Exam   Constitutional: He is oriented to person, place, and time.   Ill-appearing African-American gentleman, in mild distress.   Neck: No JVD present. No thyromegaly present.   Cardiovascular: Normal rate, regular rhythm and normal heart sounds.   Pulmonary/Chest: Effort normal and breath sounds normal.   Abdominal: Bowel sounds are normal. He exhibits no distension and no mass. There is no abdominal tenderness.   Musculoskeletal:         General: No tenderness.      Comments: There is trace  edema in bilateral feet and lower extremities about midway up the calf.  Skin is bound down.  No erythema, tenderness.   Lymphadenopathy:     He has no cervical adenopathy.   No adenopathy neck, supraclavicular, axillary inguinal regions.   Neurological: He is alert and oriented to person, place, and time.   Skin: No rash noted.   Psychiatric: He has a normal mood and affect. His behavior is normal.       Karnofsky/Lansky Performance Status  80, Normal activity with effort; some signs or symptoms of disease (ECOG equivalent 1)    Results:    WBC   Date Value Ref Range Status   03/29/2019 7.6 4.5 - 11.0 10*9/L Final   04/17/2012 7.6 4.5 - 11.0 x10 9th/L Final     HGB   Date Value Ref Range Status   03/29/2019 11.2 (L) 13.5 - 17.5 g/dL Final   40/98/1191 47.8 13.5 - 17.5 G/DL Final     HCT   Date Value Ref Range Status   03/29/2019 32.4 (L) 41.0 - 53.0 % Final   04/17/2012 39.2 (L) 41.0 - 53.0 % Final     Platelet   Date Value Ref Range Status   03/29/2019 322 150 - 440 10*9/L Final   04/17/2012 242 150 - 440 x10 9th/L Final     Creatinine Whole Blood, POC   Date Value Ref Range Status   07/25/2014 1.6 (H) 0.8 - 1.4 mg/dL Final     Creatinine, Whole Blood   Date Value Ref Range Status   07/25/2014 1.4 0.8 - 1.4 mg/dL Final     Creatinine   Date Value Ref Range Status   03/29/2019 1.02 0.70 - 1.30 mg/dL Final   29/56/2130 8.65 0.70 - 1.30 mg/dL Final     AST   Date Value Ref Range Status   03/11/2019 57 (H) 19 - 55 U/L Final  09/11/2011 38 19 - 55 U/L Final     PSA Diagnostic   Date Value Ref Range Status   09/28/2014 4.4 (H) 0.0 - 4.0 ng/mL Final   05/30/2014 5.0 (H) 0.0 - 4.0 ng/mL Final   03/23/2014 4.6 (H) 0.0 - 4.0 ng/mL Final   10/04/2013 3.7 0.0 - 4.0 ng/mL Final   06/24/2013 2.6 0.0 - 4.0 ng/mL Final   01/18/2013 1.6 0.0 - 4.0 NG/ML Final   09/28/2012 1.5 0.0 - 4.0 NG/ML Final   07/03/2012 1.0 0.0 - 4.0 NG/ML Final   12/30/2011 0.5 0.0 - 4.0 NG/ML Final   07/15/2011 0.3 0.0 - 4.0 NG/ML Final   04/08/2011 0.3 0.0 - 4.0 NG/ML Final

## 2019-04-14 NOTE — Unmapped (Signed)
[facility] Clinical Assessment & Refill Coordination Note    Joseph Phillips, DOB: 02-08-1961  Phone: [phone] (home) [phone] (work)    All above HIPAA information was verified with patient.     Specialty Medication(s):   Hematology/Oncology: [name]     Current Outpatient Medications   Medication Sig Dispense Refill   ??? albuterol HFA 90 mcg/actuation inhaler Inhale 2 puffs every six (6) hours as needed for wheezing. 1 Inhaler 2   ??? amLODIPine (NORVASC) 10 MG tablet Take 1 tablet (10 mg total) by mouth daily. 30 tablet 11   ??? busPIRone (BUSPAR) 5 MG tablet Take 1 tablet (5 mg total) by mouth Three (3) times a day. 270 tablet 3   ??? diclofenac sodium (VOLTAREN) 1 % gel Apply 2 g topically Four (4) times a day. Chest, area of pain 100 g 0   ??? diphenhydrAMINE (BENADRYL) 25 mg tablet Take 0.5 tablets (12.5 mg total) by mouth every six (6) hours as needed for itching. 30 capsule 0   ??? diphenhydramine HCl (MAGIC MOUTHWASH ORAL) suspension Take 10 mL by mouth 4 (four) times a day as needed. 120 mL 1   ??? enzalutamide (XTANDI) 40 mg capsule Take 2 capsules (80 mg total) by mouth daily. 60 capsule 2   ??? magnesium oxide (MAG-OX) 400 mg (241.3 mg magnesium) tablet Take 1 tablet (400 mg total) by mouth Two (2) times a day. (Patient not taking: Reported on 03/29/2019) 180 tablet 3   ??? omeprazole (PRILOSEC) 20 MG capsule take 2 capsule by mouth once daily 180 capsule 3   ??? oxyCODONE (ROXICODONE) 5 MG immediate release tablet Take 2 tablets (10 mg total) by mouth every four (4) hours as needed for pain. 100 tablet 0   ??? pancrelipase, Lip-Prot-Amyl, (CREON) 24,000-76,000 -120,000 unit CpDR delayed release capsule Take 48,000 units of lipase by mouth Three (3) times a day with a meal. Take an additional 1 capsule with snacks.     ??? prochlorperazine (COMPAZINE) 10 MG tablet Take 1 tablet (10 mg total) by mouth every eight (8) hours as needed. 30 tablet 11   ??? tiotropium (SPIRIVA WITH HANDIHALER) 18 mcg inhalation capsule Place 1 capsule (18 mcg total) into inhaler and inhale once daily. 90 capsule 3   ??? traZODone (DESYREL) 50 MG tablet Take 1 tablet (50 mg total) by mouth nightly as needed for sleep. 90 tablet 3   ??? valACYclovir (VALTREX) 500 MG tablet Take 1 tablet (500 mg total) by mouth daily. (Patient not taking: Reported on 03/29/2019) 90 tablet 3     No current facility-administered medications for this visit.         Changes to medications: Joseph Phillips reports no changes at this time.    Allergies   Allergen Reactions   ??? Penicillins      Other reaction(s): UNKNOWN   ??? Tramadol Dizziness       Changes to allergies: No    SPECIALTY MEDICATION ADHERENCE     Xtandi 40 mg: 10 days of medicine on hand     Medication Adherence    Support network for adherence:  family member          Specialty medication(s) dose(s) confirmed: Xtandi dose decreased to 2 capsules daily     Are there any concerns with adherence? Yes: Pt reported that he was discontinued.  Had not been taking medication at this time.  He will restart taking today    Adherence counseling provided? Not needed  CLINICAL MANAGEMENT AND INTERVENTION      Clinical Benefit Assessment:    Do you feel the medicine is effective or helping your condition? Patient declined to answer    Clinical Benefit counseling provided? Progress note from 03/29/19 shows evidence of clinical benefit    Adverse Effects Assessment:    Are you experiencing any side effects? No    Are you experiencing difficulty administering your medicine? No    Quality of Life Assessment:    How many days over the past month did your prostate cancer  keep you from your normal activities? For example, brushing your teeth or getting up in the morning. 0    Have you discussed this with your provider? Not needed    Therapy Appropriateness:    Is therapy appropriate? Yes, therapy is appropriate and should be continued    DISEASE/MEDICATION-SPECIFIC INFORMATION      N/A    PATIENT SPECIFIC NEEDS     ? Does the patient have any physical, cognitive, or cultural barriers? No    ? Is the patient high risk? No     ? Does the patient require a Care Management Plan? No     ? Does the patient require physician intervention or other additional services (i.e. nutrition, smoking cessation, social work)? No      SHIPPING     Specialty Medication(s) to be Shipped:   Hematology/Oncology: [name]    Other medication(s) to be shipped: none     Changes to insurance: No    Delivery Scheduled: Yes, Expected medication delivery date: 04/20/19.     Medication will be delivered via Next Day Courier to the confirmed home address in [facility].    The patient will receive a drug information handout for each medication shipped and additional FDA Medication Guides as required.  Verified that patient has previously received a Conservation officer, historic buildings.    Joseph Phillips [facility] Specialty Pharmacist

## 2019-04-15 NOTE — Unmapped (Signed)
Spoke with patient about appts set for 5/4

## 2019-04-15 NOTE — Unmapped (Signed)
Called patient to discuss upcoming clinic visit with Dr. Claude Manges on Wednesdaay, 04/21/19, at 2pm. Rescheduling this clinic appointment to Monday, 04/26/19, at 1pm in coordination with his next Xgeva injection. Patient verbalized understanding and had no additional questions at this time.

## 2019-04-16 MED ORDER — BUSPIRONE 5 MG TABLET
ORAL_TABLET | 3 refills | 0 days | Status: CP
Start: 2019-04-16 — End: ?

## 2019-04-19 MED FILL — XTANDI 40 MG CAPSULE: 30 days supply | Qty: 60 | Fill #0 | Status: AC

## 2019-04-20 NOTE — Unmapped (Signed)
Hi,    Patient Joseph Phillips called requesting a medication refill for the following:    ? Medication: pain pills  ? Days left of medication: 6 pills  ? Pharmacy: [facility] on file      Check Indicates criteria has been reviewed and confirmed with the patient:    []  Preferred Name   [x]  DOB and/or MR#  [x]  Preferred Contact Method  [x]  Phone Number(s)   [x]  Preferred Pharmacy   []  MyChart     Thank you,  Deatra Ina  [facility]  [phone]

## 2019-04-21 MED ORDER — OXYCODONE 5 MG TABLET
ORAL_TABLET | ORAL | 0 refills | 0 days | Status: CP | PRN
Start: 2019-04-21 — End: 2019-05-11

## 2019-04-22 NOTE — Unmapped (Signed)
Nutrition Note    Telephone Outreach Encounter (in lieu of in-person visit during COVID-19 pandemic).    Pt is a 58 y.o. with Stage IV Prostate Cancer who was referred to RD for Ensure.    Called and spoke with pt, who was very difficult to understand on the phone.  Asked pt what his needs were and he said he needed something to give me an appetite and felt Ensure would make him hungry.  Explained to pt that Nutrition supplements like Ensure are not appetite stimulants but rather add additional calories for people who need extra.  Looking at pt's weight, it doesn't appear he has had any weight loss indicating a need for supplements.      03/29/19 72.9 kg (160 lb 11.2 oz)   03/01/19 72.7 kg (160 lb 4.4 oz)   02/02/19 73.1 kg (161 lb 1.6 oz)   01/19/19 70.3 kg (155 lb)   11/23/18 72.6 kg (160 lb)     Performed diet recall and he ate 3 meals yesterday of what sounded like chicken wings and biscuits (he was difficult to understand).  Explained to pt that [facility] does not have any Ensure available to provide patients.  Considering his weight is stable at a healthy range and he reported good intake yesterday, wouldn't necessarily consider him in need of supplements, but encouraged him to ask his internal medicine doctor if they have any resources available to him.    Neila Gear MS, RD, CSO, LDN

## 2019-04-23 NOTE — Unmapped (Signed)
04/23/19    Travel Screening Questions Completed.    Travel Screening Questions/Answers:  1). Have you traveled within the last 14 days?: No  2). Do you have new or worsening respiratory symptoms (e.g. cough, difficulty breathing)?: No  3). Have you had close contact with a person with confirmed COVID-19 in the last 14 days before symptoms began?: No      Negative Travel Screen: Patient answered NO to questions 2 and 3. Proceed with current scheduling protocol for your clinic.       The call was handled in the following manner: Documented patient response and encounter closed     Informed patient that only 1 guest will be allowed to accompanied them to their scheduled appointment. No one under the age of 53 is allowed to come into the building. Pt verbalized understanding and had no additional questions at this time.

## 2019-04-26 ENCOUNTER — Institutional Professional Consult (permissible substitution): Admit: 2019-04-26 | Discharge: 2019-04-26 | Payer: MEDICAID

## 2019-04-26 ENCOUNTER — Ambulatory Visit: Admit: 2019-04-26 | Discharge: 2019-04-26 | Payer: MEDICAID

## 2019-04-26 ENCOUNTER — Ambulatory Visit
Admit: 2019-04-26 | Discharge: 2019-04-26 | Payer: MEDICAID | Attending: Hematology & Oncology | Primary: Hematology & Oncology

## 2019-04-26 DIAGNOSIS — C61 Malignant neoplasm of prostate: Principal | ICD-10-CM

## 2019-04-26 DIAGNOSIS — K860 Alcohol-induced chronic pancreatitis: Secondary | ICD-10-CM

## 2019-04-26 LAB — PROSTATE SPECIFIC ANTIGEN: Prostate specific Ag:MCnc:Pt:Ser/Plas:Qn:: 60.2 — ABNORMAL HIGH

## 2019-04-26 LAB — HEPATIC FUNCTION PANEL
AST (SGOT): 67 U/L — ABNORMAL HIGH (ref 19–55)
BILIRUBIN TOTAL: 0.4 mg/dL (ref 0.0–1.2)
PROTEIN TOTAL: 7.2 g/dL (ref 6.5–8.3)

## 2019-04-26 LAB — CBC W/ AUTO DIFF
BASOPHILS ABSOLUTE COUNT: 0 10*9/L (ref 0.0–0.1)
BASOPHILS RELATIVE PERCENT: 0.7 %
EOSINOPHILS ABSOLUTE COUNT: 0.1 10*9/L (ref 0.0–0.4)
EOSINOPHILS RELATIVE PERCENT: 1.5 %
LARGE UNSTAINED CELLS: 3 % (ref 0–4)
LYMPHOCYTES ABSOLUTE COUNT: 2.4 10*9/L (ref 1.5–5.0)
LYMPHOCYTES RELATIVE PERCENT: 35 %
MEAN CORPUSCULAR HEMOGLOBIN CONC: 35 g/dL (ref 31.0–37.0)
MEAN CORPUSCULAR HEMOGLOBIN: 33.1 pg (ref 26.0–34.0)
MEAN CORPUSCULAR VOLUME: 94.5 fL (ref 80.0–100.0)
MEAN PLATELET VOLUME: 7.6 fL (ref 7.0–10.0)
MONOCYTES RELATIVE PERCENT: 4.4 %
NEUTROPHILS ABSOLUTE COUNT: 3.8 10*9/L (ref 2.0–7.5)
NEUTROPHILS RELATIVE PERCENT: 55.7 %
PLATELET COUNT: 168 10*9/L (ref 150–440)
RED BLOOD CELL COUNT: 3.37 10*12/L — ABNORMAL LOW (ref 4.50–5.90)
RED CELL DISTRIBUTION WIDTH: 13.3 % (ref 12.0–15.0)
WBC ADJUSTED: 6.8 10*9/L (ref 4.5–11.0)

## 2019-04-26 LAB — LIPASE: Triacylglycerol lipase:CCnc:Pt:Ser/Plas:Qn:: 92

## 2019-04-26 LAB — CREATININE
EGFR CKD-EPI AA MALE: >90 mL/min/{1.73_m2} (ref >=60)
EGFR CKD-EPI AA MALE: >90 mL/min/{1.73_m2} (ref >=60)

## 2019-04-26 LAB — ALBUMIN: Albumin:MCnc:Pt:Ser/Plas:Qn:: 4.2

## 2019-04-26 LAB — PHOSPHORUS: Phosphate:MCnc:Pt:Ser/Plas:Qn:: 1.9 — ABNORMAL LOW

## 2019-04-26 LAB — EGFR CKD-EPI AA MALE
Lab: >90
Lab: >90

## 2019-04-26 LAB — CALCIUM: Calcium:MCnc:Pt:Ser/Plas:Qn:: 8.5

## 2019-04-26 LAB — AST (SGOT): Aspartate aminotransferase:CCnc:Pt:Ser/Plas:Qn:: 67 — ABNORMAL HIGH

## 2019-04-26 LAB — MEAN CORPUSCULAR HEMOGLOBIN CONC: Lab: 35

## 2019-04-26 MED ORDER — FENTANYL 25 MCG/HR TRANSDERMAL PATCH
MEDICATED_PATCH | TRANSDERMAL | 0 refills | 0 days | Status: CP
Start: 2019-04-26 — End: 2019-05-27

## 2019-04-26 NOTE — Unmapped (Addendum)
[name] here for followup with Dr. Claude Manges.    Weight: Fluctuating a 2-3 lbs  Energy Level/Fatigue/Active Level: Low, better than it was yesterday - pt walks occasionally  Elimination/Discharge: No issues reported today  Nausea/Vomitting: Occasional nausea - takes Compazine as needed  Appetite/Diet: Fair - Poor - one day I might eat good and go two days without eating - nutrition working with patient and helping him with getting Ensure   Pain/Swelling: No pain right now  Skin: Intact    Chemo: Xandi  Psychosocial: No needs  Refills of Meds: Volaren    Pt's port accessed - flushed, bld return noted, labs drawn and heplocked. Pt tolerated well.    Pt received Xgeva injection in left upper arm - pt tolerated well.    Notified Dr. Claude Manges.

## 2019-04-26 NOTE — Unmapped (Signed)
Initial Visit Note    Patient Name: Joseph Phillips  Patient Age: 58 y.o.  Encounter Date: 04/26/2019    Referring Physician:     Towanda Malkin, MD  [address]  [id number] [facility] [phone]  [place], [place] [address]    Primary Care Provider:  Unknown Foley, MD    Assessment/Plan:    Reason for visit:  Prostate Cancer    Cancer Staging  Prostate cancer (CMS-HCC)  Staging form: Prostate, AJCC 7th Edition  - Clinical: Stage IV (T3a, N0, M1b, PSA: Less than 10, Gleason 7) - Unsigned      --Prostate cancer, stage IV,      -- with progression on Lupron/Zytiga     --07/14/18: Docetaxel/prednisone     --11/11/2018: Neutropenic fever, with possible UTI and oral mucositis     -- 02/03/19: [name];  Held 03/01/19 for LE swelling/cellulitis; restart 03/29/19 at 80 mg qD  --Hypokalemia  --Hypomagnesemia  --Leg cramps and pain, exacerbated by electrolyte disturbances      --03/01/19: Skin changes in the lower extremities of uncertain etiology.  --Teeth in poor repair, awaiting dental correction  --Hypertension  --Lower extremity swelling, exacerbated at night; improved on triamterene-HCTZ  -- OSA; positive study for both central and obstructive apnea; CPAP trial recommended, 07/23/2018; deferred by the patient  --History of pancreatitis with pancreatic insufficiency; on Creon      Recommendation  --Patient is tolerating Xtandi at half dose.  Will check PSA and if acceptable continue at this dose, and consider increasing to three-quarter dose.  -- He received Xgeva today.   -- Will plan to see back in 1 month. To consider dose escalation of Xtandi  -- Admonished to quit smoking.  --Patient states that he is getting inadequate pain relief from oxycodone alone, and having to wake up in the middle the night to take more pain medication.  He will be placed on a trial of low-dose fentanyl 25 mcg, in addition to as needed oxycodone    I have reviewed the laboratory, pathology, and radiology reports in detail and discussed findings with the patient    History of Present Illness:     Joseph Phillips is a 58 y.o. male who is seen in consultation at the request of Lonn Georgia* for an evaluation of Prostate Cancer.  Patient returns after restarting Xtandi at 80 mg daily.  The swelling in his feet and lower extremities has not recurred.  He is tolerating this dose.  He continues have significant pains, mostly in joints, but also in the bones.  He also has discomfort in the bottom of his feet but that is unchanged from previously.  Is also been having some pains reminiscent of his pancreatitis, in the right upper quadrant.  He denies drinking alcohol.  He denies nausea, vomiting, bowel or bladder dysfunction.  He denies fevers, shaking chills, headaches, visual changes.  He does have some hot flashes but they are not too bad.          Oncology History:  Patient was originally diagnosed with prostate cancer in 2006.  He underwent robot-assisted prostatectomy.  He did well until May 2018, when his PSA rose to a high of about 45, and he was started on Lupron, initially with Casodex, and then with apparatus.  His PSA fell and remained low until recently when he was noted to be rising from a low of 0.82 on  01/01/18, to 3.99 on 06/29/2018.  He was instructed to hold Zytiga.  Since  holding Zytiga he is noted swelling in his ankles.  The swelling comes on overnight, improves during the course the day.  He was evaluated with Doppler studies on 06/29/2018 which showed no evidence of blood clots.  He is still bothered by the swelling.  Restaging studies done on 06/04/2018 including bone scan, and CT of the chest abdomen pelvis, showed sclerotic changes throughout the axial skeleton, and involvement also of scapula, some ribs and pelvis.  The sites were new since 2016, and also probably new since the CT stone study in May 2018.  Bone scan also showed some progression in disease by report, compared to 2018.  He has developed pain in the back of his calves, for which he takes oxycodone (Tylenol and ibuprofen were ineffective).  The pain followed prolonged episodes of cramping, felt to be related to low magnesium and potassium.  About 6 months ago he had shingles in a left L2 distribution.           Prostate cancer (CMS-HCC)    12/02/2008 Biopsy     Prostate, left apex, needle biopsy  - Adenocarcinoma, Gleason combined score 6 (3+3), 2 of 2 cores involved, tumor  approximately 2 mm in greatest diameter, approximately 10% of tissue involved by  carcinoma.  - See comment    E: ??Prostate, left mid, needle biopsy  - Adenocarcinoma, Gleason combined score 6 (3+3), 1 of 2 cores involved, tumor  approximately 2 mm in greatest diameter, approximately 5% of tissue involved by  carcinoma.  - See comment    F: ??Prostate, left base, needle biopsy  - Adenocarcinoma, Gleason combined score 6 (3+3), 1 of 2 cores involved, tumor  approximately 1 mm in greatest diameter, approximately 5% of tissue involved by  carcinoma.      01/24/2009 Surgery     Prostate, robot assisted laparoscopic prostatectomy  Tumor histologic type: Prostatic adenocarcinoma  Tumor grade (Gleason system): Gleason pattern 7 (3+4)  Approximate percentage of prostate involved by tumor: 40%  Extent of tumor:  Location of tumor involvement in prostate: right and left lobes, anterior  and posterior, apex to base  Confinement/non-confinement of tumor within the prostatic capsule: ??not  confined  Location of extracapsular tumor involvement: ??left prostate toward the base  (ie. block A22)  Type of extracapsular tissue involved within tumor: ?? adipose  tissue/perineural tissue  Angiolymphatic space invasion: not identified  Seminal vesicles: negative  Vas deferens: negative  Histologic assessment of surgical margins: positive margin along a 3 mm  face in the left prostate base (block A25)    Other significant findings: High grade prostatic intraepithelial  Neoplasm;  Lymph nodes: Not applicable  AJCC Staging: pT3a pNx      08/30/2013 Initial Diagnosis     Prostate cancer (CMS-HCC)      04/30/2017 -  Chemotherapy     Lupron 22.5 mg; with bicalutamide x  month      08/01/2017 - 06/30/2018 Chemotherapy     Abiaterone 1000 mg daily; decrease to 750 mg daily at 10/2018;      10/30/2017 -  Cancer Staged     Starta: TP53, TMPSSR  -EGR fusion; no other abnormalities identified      01/14/2018 -  Cancer Staged     CT Chest:  Numerous sclerotic metastases of the thoracic spine, left scapula  and multiple ribs.  3. Coronary artery and aortic atherosclerosis (ICD10-I70.0).  4. Previously demonstrated left renal mass is not included in the  field of view.  06/04/2018 -  Cancer Staged     CT CAP:  Scattered diffuse sclerotic metastatic lesions are seen throughout the spine as well as the left scapula; some of the lesions are new since prior CT dated 04/20/2015.  - No intrathoracic metastatic disease.  Interval progression of osseous metastases throughout the spine and pelvis compared to 04/22/2017.  -- Cholelithiasis.  -- Hepatic steatosis.      06/04/2018 -  Cancer Staged     Bone scan:  Increased radiotracer uptake throughout the spine, sacrum, and left ilium, consistent with numerous metastases better evaluated on same-day CT. Unchanged metastasis to the left proximal humerus.      07/14/2018 -  Chemotherapy     OP PROSTATE DOCETAXEL/PREDNISONE  Docetaxel 75 mg/m2 evry 21 days      01/07/2019 -  Cancer Staged     CTA Chest:  No evidence of pulmonary embolism.  2. Occlusion of the medial basilar segmental bronchus of the left  lower lobe, nonspecific though could be seen in the setting of  aspiration. Otherwise, no acute findings.  3. Apical predominant paraseptal emphysematous change with suspected  mild progression of smoking related fibrosis with subpleural  reticulation but without evidence of honeycombing. Emphysema  (ICD10-J43.9).  4. Progression of osseous metastatic disease since 11/2017. Detailed  above. No definitive pathologic compression fractures.  5. Hepatic steatosis. 6. Coronary artery calcifications. Aortic Atherosclerosis      01/20/2019 -  Cancer Staged     NM Bone scan: Redemonstrated multiple abnormal foci throughout the appendicular axial skeleton, with interval increase in the extent of the T10 vertebral body lesion and new lesions involving the manubrium, left fifth and seventh ribs. This is most consistent with progression of osseous metastatic disease.      02/03/2019 -  Chemotherapy     Xtandi 160 mg qD; held after 1 month due to swelling.  Restart at 80 mg qD on 03/29/19      03/10/2019 -  Cancer Staged     CTA chest:  No evidence of acute aortic intramural hematoma or dissection.  Multifocal osseous sclerotic metastatic disease, progressed when compared to prior CT 06/04/2018 and grossly similar to most recent nuclear medicine bone scan.  Mucoid impacted airways in left lower lobe with adjacent subtle ground glass opacities; likely represents sequelae of aspiration. Recommend follow up CT in 6 to 8 weeks to ensure clearing.  Other chronic/incidental findings as above, not substantially changed.         Past Medical History:   Diagnosis Date   ??? Hx of radiation therapy    ??? Hypertension    ??? Prostate cancer (CMS-HCC)       Past Surgical History:   Procedure Laterality Date   ??? IR INSERT PORT AGE GREATER THAN 5 YRS  07/15/2018    IR INSERT PORT AGE GREATER THAN 5 YRS 07/15/2018 Carolin Coy, MD [facility]   ??? PR COLSC FLX W/RMVL OF TUMOR POLYP LESION SNARE TQ N/A 08/16/2015    Procedure: COLONOSCOPY FLEX; W/REMOV TUMOR/LES BY SNARE;  Surgeon: Gwen Pounds, MD;  Location: [facility];  Service: Gastroenterology   ??? PR COLSC FLX WITH DIRECTED SUBMUCOSAL NJX ANY SBST N/A 08/16/2015    Procedure: COLONOSCOPY, FLEXIBLE, PROXIMAL TO SPLENIC FLEXURE; WITH DIRECTED SUBMUCOSAL INJECTION(S), ANY SUBSTANCE;  Surgeon: Gwen Pounds, MD;  Location: [facility];  Service: Gastroenterology   ??? PROSTATE SURGERY          Family History  Problem Relation Age of Onset   ??? Melanoma Neg Hx    ??? Basal cell carcinoma Neg Hx    ??? Squamous cell carcinoma Neg Hx       No family status information on file.     Social History     Occupational History   ??? Not on file   Tobacco Use   ??? Smoking status: Current Some Day Smoker     Packs/day: 0.20     Years: 21.00     Pack years: 4.20     Types: Cigarettes     Start date: 05/09/1994   ??? Smokeless tobacco: Never Used   ??? Tobacco comment: smoking anywhere from 0-3 cigarettes daily   Substance and Sexual Activity   ??? Alcohol use: Yes     Alcohol/week: 2.0 standard drinks     Types: 2 Cans of beer per week   ??? Drug use: No   ??? Sexual activity: Not Currently       Allergies   Allergen Reactions   ??? Penicillins      Other reaction(s): UNKNOWN   ??? Tramadol Dizziness       Current Outpatient Medications   Medication Sig Dispense Refill   ??? albuterol HFA 90 mcg/actuation inhaler Inhale 2 puffs every six (6) hours as needed for wheezing. 1 Inhaler 2   ??? amLODIPine (NORVASC) 10 MG tablet Take 1 tablet (10 mg total) by mouth daily. 30 tablet 11   ??? busPIRone (BUSPAR) 5 MG tablet TAKE 1 TABLET BY MOUTH THREE TIMES A DAY 270 tablet 3   ??? diclofenac sodium (VOLTAREN) 1 % gel Apply 2 g topically Four (4) times a day. Chest, area of pain 100 g 0   ??? diphenhydrAMINE (BENADRYL) 25 mg tablet Take 0.5 tablets (12.5 mg total) by mouth every six (6) hours as needed for itching. 30 capsule 0   ??? diphenhydramine HCl (MAGIC MOUTHWASH ORAL) suspension Take 10 mL by mouth 4 (four) times a day as needed. 120 mL 1   ??? enzalutamide (XTANDI) 40 mg capsule Take 2 capsules (80 mg total) by mouth daily. 60 capsule 2   ??? omeprazole (PRILOSEC) 20 MG capsule take 2 capsule by mouth once daily 180 capsule 3   ??? oxyCODONE (ROXICODONE) 5 MG immediate release tablet Take 2 tablets (10 mg total) by mouth every four (4) hours as needed for pain. 100 tablet 0   ??? pancrelipase, Lip-Prot-Amyl, (CREON) 24,000-76,000 -120,000 unit CpDR delayed release capsule Take 48,000 units of lipase by mouth Three (3) times a day with a meal. Take an additional 1 capsule with snacks.     ??? prochlorperazine (COMPAZINE) 10 MG tablet Take 1 tablet (10 mg total) by mouth every eight (8) hours as needed. 30 tablet 11   ??? tiotropium (SPIRIVA WITH HANDIHALER) 18 mcg inhalation capsule Place 1 capsule (18 mcg total) into inhaler and inhale once daily. 90 capsule 3   ??? traZODone (DESYREL) 50 MG tablet Take 1 tablet (50 mg total) by mouth nightly as needed for sleep. 90 tablet 3   ??? magnesium oxide (MAG-OX) 400 mg (241.3 mg magnesium) tablet Take 1 tablet (400 mg total) by mouth Two (2) times a day. (Patient not taking: Reported on 04/26/2019) 180 tablet 3   ??? valACYclovir (VALTREX) 500 MG tablet Take 1 tablet (500 mg total) by mouth daily. (Patient not taking: Reported on 03/29/2019) 90 tablet 3     Current Facility-Administered Medications   Medication Dose Route Frequency Provider  Last Rate Last Dose   ??? heparin, porcine (PF) 100 unit/mL injection 500 Units  500 Units Intravenous Q30 Min PRN Towanda Malkin, MD         Facility-Administered Medications Ordered in Other Visits   Medication Dose Route Frequency Provider Last Rate Last Dose   ??? denosumab (XGEVA) injection 120 mg  120 mg Subcutaneous Once Towanda Malkin, MD           The following portions of the patient's history were reviewed and updated as appropriate: allergies, current medications, past family history, past medical history, past social history, past surgical history and problem list.     Review of Systems   Constitutional: Positive for fatigue. Negative for appetite change, chills, diaphoresis, fever and unexpected weight change.   HENT:   Negative for mouth sores, nosebleeds and trouble swallowing.    Eyes: Negative for eye problems.   Respiratory: Negative for chest tightness, cough, hemoptysis and shortness of breath.    Cardiovascular: Negative for chest pain and leg swelling.   Gastrointestinal: Negative for abdominal pain, blood in stool, constipation, diarrhea and nausea.   Genitourinary: Negative for difficulty urinating and hematuria.    Musculoskeletal: Positive for back pain and myalgias. Negative for arthralgias.   Skin: Negative for rash.   Neurological: Negative for extremity weakness, headaches and seizures.   Hematological: Negative for adenopathy.   Psychiatric/Behavioral: Positive for depression. Negative for confusion.   All other systems reviewed and are negative.        Vital Signs for this encounter:  BSA: 1.88 meters squared  BP 126/70  - Pulse 87  - Temp 36.8 ??C (98.2 ??F) (Temporal)  - Resp 18  - Wt 73.9 kg (162 lb 14.4 oz)  - SpO2 100%  - BMI 24.77 kg/m??     Physical Exam   Constitutional: He is oriented to person, place, and time.   Ill-appearing African-American gentleman, in mild distress.   Neck: No JVD present. No thyromegaly present.   Cardiovascular: Normal rate, regular rhythm and normal heart sounds.   Pulmonary/Chest: Effort normal and breath sounds normal.   Abdominal: Bowel sounds are normal. He exhibits no distension and no mass. There is no abdominal tenderness.   Musculoskeletal:         General: No tenderness.      Comments: There is trace  edema in bilateral feet and lower extremities about midway up the calf.  Skin is bound down.  No erythema, tenderness.   Lymphadenopathy:     He has no cervical adenopathy.   No adenopathy neck, supraclavicular, axillary inguinal regions.   Neurological: He is alert and oriented to person, place, and time.   Skin: No rash noted.   Psychiatric: He has a normal mood and affect. His behavior is normal.       Karnofsky/Lansky Performance Status  80, Normal activity with effort; some signs or symptoms of disease (ECOG equivalent 1)    Results:    WBC   Date Value Ref Range Status   03/29/2019 7.6 4.5 - 11.0 10*9/L Final   04/17/2012 7.6 4.5 - 11.0 x10 9th/L Final     HGB   Date Value Ref Range Status   03/29/2019 11.2 (L) 13.5 - 17.5 g/dL Final   16/09/9603 54.0 13.5 - 17.5 G/DL Final     HCT   Date Value Ref Range Status   03/29/2019 32.4 (L) 41.0 - 53.0 % Final   04/17/2012 39.2 (L) 41.0 - 53.0 %  Final     Platelet   Date Value Ref Range Status   03/29/2019 322 150 - 440 10*9/L Final   04/17/2012 242 150 - 440 x10 9th/L Final     Creatinine Whole Blood, POC   Date Value Ref Range Status   07/25/2014 1.6 (H) 0.8 - 1.4 mg/dL Final     Creatinine, Whole Blood   Date Value Ref Range Status   07/25/2014 1.4 0.8 - 1.4 mg/dL Final     Creatinine   Date Value Ref Range Status   03/29/2019 1.02 0.70 - 1.30 mg/dL Final   16/09/9603 5.40 0.70 - 1.30 mg/dL Final     AST   Date Value Ref Range Status   03/11/2019 57 (H) 19 - 55 U/L Final   09/11/2011 38 19 - 55 U/L Final     PSA Diagnostic   Date Value Ref Range Status   09/28/2014 4.4 (H) 0.0 - 4.0 ng/mL Final   05/30/2014 5.0 (H) 0.0 - 4.0 ng/mL Final   03/23/2014 4.6 (H) 0.0 - 4.0 ng/mL Final   10/04/2013 3.7 0.0 - 4.0 ng/mL Final   06/24/2013 2.6 0.0 - 4.0 ng/mL Final   01/18/2013 1.6 0.0 - 4.0 NG/ML Final   09/28/2012 1.5 0.0 - 4.0 NG/ML Final   07/03/2012 1.0 0.0 - 4.0 NG/ML Final   12/30/2011 0.5 0.0 - 4.0 NG/ML Final   07/15/2011 0.3 0.0 - 4.0 NG/ML Final   04/08/2011 0.3 0.0 - 4.0 NG/ML Final

## 2019-04-26 NOTE — Unmapped (Signed)
Injection given during clinic visit.

## 2019-05-03 ENCOUNTER — Institutional Professional Consult (permissible substitution): Admit: 2019-05-03 | Discharge: 2019-05-03 | Payer: MEDICAID | Attending: Registered" | Primary: Registered"

## 2019-05-03 ENCOUNTER — Institutional Professional Consult (permissible substitution)
Admit: 2019-05-03 | Discharge: 2019-05-03 | Payer: MEDICAID | Attending: Student in an Organized Health Care Education/Training Program | Primary: Student in an Organized Health Care Education/Training Program

## 2019-05-03 DIAGNOSIS — J449 Chronic obstructive pulmonary disease, unspecified: Secondary | ICD-10-CM

## 2019-05-03 DIAGNOSIS — C61 Malignant neoplasm of prostate: Principal | ICD-10-CM

## 2019-05-03 DIAGNOSIS — I10 Essential (primary) hypertension: Secondary | ICD-10-CM

## 2019-05-03 DIAGNOSIS — F17200 Nicotine dependence, unspecified, uncomplicated: Secondary | ICD-10-CM

## 2019-05-03 MED ORDER — SALMETEROL 50 MCG/DOSE BLISTER POWDER FOR INHALATION
Freq: Two times a day (BID) | RESPIRATORY_TRACT | 12 refills | 0 days | Status: CP
Start: 2019-05-03 — End: 2020-05-02

## 2019-05-03 NOTE — Unmapped (Signed)
Previsit complete    Time spent on phone: 2 mins    Reason for visit: follow up     Questions / Concerns that need to be addressed:    General Consent to Treat (GCT) for non-epic video visits only:    Vitals signs, if any:       Diabetes:  ? Regularly checking blood sugars?: no  o If yes, when? Complete log  Date Before Breakfast After Breakfast Before Lunch After Lunch Before Dinner After Dinner Before Bed                                                                                                                                 Hypertension:  ? Have blood pressure cuff at home?: no  ? Regularly checking blood pressure?: no  If yes, complete log  Date Time BP Pulse                                                                           Weight:    Date          Allergies reviewed: Yes    Medication reviewed: Yes    Refills needed, if any:     Travel Screening: completed    Falls Risk: completed    [place] (DV) Screening: completed    HCDM reviewed and updated in Epic:  Completed   )      HARK Screening       AUDIT       PHQ2       PHQ9  Thoughts that you would be better off dead, or of hurting yourself in some way: Not at all  PHQ-9 TOTAL SCORE: 0    GAD7       COPD Assessment

## 2019-05-03 NOTE — Unmapped (Signed)
I was the supervising physician in the delivery of the service. Silvina Hackleman E Kimel-Scott, MD

## 2019-05-03 NOTE — Unmapped (Signed)
Patient Stated Health/Nutrition Goals:  Meet nutritional needs   Reduce long-term health risk    Identified treatment goals: Not applicable    Nutrition Interventions (ADVISE/ASSIST):  1. Small meals throughout the day:   A. Tuna/Chicken salad   B. Boiled eggs   C. Peanut butter on toast/crackers   D. Beans    It was a pleasure working with you today!    Dorien Chihuahua, RDN  [facility]   Clinical Dietitian/Enhanced Care Program Manager  Phone: [phone]  Email: [email or web address]

## 2019-05-03 NOTE — Unmapped (Signed)
Internal Medicine Phone Visit    This visit is conducted via telephone.    Contact Information  Person Contacted: Patient  Contact Phone number: [phone] (home) [phone] (work)  Is there someone else in the room? No.   Patient agreed to a phone visit    [name] is a 58 y.o. male  participating in a telephone encounter.    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     Patient states that he is doing pretty well.     Htn: Has a malfunctioning bp cuff at home. Taking Amlodipine which was restarted by Dr. Claude Manges in clinc due to elevated pressures in clinic. Last pressure on 5/4 was at goal.    Tobacco Abuse: Still smoking about 2 cigarettes per day. No interested in quitting at this time.     COPD: Taking Spiriva daily. He feels like his breathing is ok but using albuterol inhaler 2x daily most days. No increased in cough or sputum.    I have reviewed the problem list, medications, and allergies and have updated/reconciled them if needed.    Objective:  Able to converse without SOB. Mentation is normal.    Assessment & Plan:  1. Essential hypertension    2. Hypomagnesemia    3. Tobacco use disorder    4. Chronic obstructive pulmonary disease, unspecified COPD type (CMS-HCC)    5. Prostate cancer (CMS-HCC)        Hypertension: Well controlled.   - Continue Amlodipine 10mg  daily    Tobacco Use Disorder: Not interested in quitting at this time  - Continue to encourage smoking cessation    COPD: Using albuterol 2x daily along with Spiriva daily. No exacerbations in the past year.  - Continue Spiriva daily  - Prescribed Salmeterol daily  - Albuterol inhaler as needed    Hx Hypomagnesemia: Last Mg level low normal  - Mg level ordered for next time he has labs checked with Dr. Claude Manges    Metastatic Prostate Cancer: Mets to bone. Pain is stable on oxycodone. Follows with Dr. Claude Manges and last seen on 5/4  - Follow as scheduled with [facility]  - Oxycodone as prescribed by Oncology    Return in about 3 months (around 08/03/2019) for Office Visit.     Staffed with: Dr. Hyacinth Meeker    I spent 15 minutes on the phone with the patient. I spent an additional 10 minutes on pre- and post-visit activities.     The patient was physically located in [place] or a state in which I am permitted to provide care. The patient and/or parent/gauardian understood that s/he may incur co-pays and cost sharing, and agreed to the telemedicine visit. The visit was completed via phone and/or video, which was appropriate and reasonable under the circumstances given the patient's presentation at the time.    The patient and/or parent/guardian has been advised of the potential risks and limitations of this mode of treatment (including, but not limited to, the absence of in-person examination) and has agreed to be treated using telemedicine. The patient's/patient's family's questions regarding telemedicine have been answered.     If the phone/video visit was completed in an ambulatory setting, the patient and/or parent/guardian has also been advised to contact their provider???s office for worsening conditions, and seek emergency medical treatment and/or call 911 if the patient deems either necessary.

## 2019-05-03 NOTE — Unmapped (Signed)
This was a telehealth service performed by a resident.  Immediately after or during the visit, I reviewed with the resident the medical history and the resident's assessment and plan.  I discussed with the resident the patient's diagnosis and concur with the treatment plan as documented in the resident note.

## 2019-05-03 NOTE — Unmapped (Signed)
Enhanced Care Nutrition-Initial Phone Assessment    Nutrition Assessment:  Patient is a pleasant 58 y.o. male who reports he is having difficulty with eating. He appears to be maintaining his weight though. Not sure how much patient is actually eating as he mostly just said I don't eat that much. Will try to see if Ensure is available to him at a reduced cost. He also needed incontinence supplies, we contacted the supply company while patient was on the phone. Patient's brother will help pick these up later today.     Nutrition Diagnosis:  Undesirable food choices related to poor appetite as evidenced by skipping meals, eating highly processed foods    Patient Stated Health/Nutrition Goals:  Meet nutritional needs   Reduce long-term health risk    Identified treatment goals: Not applicable    Nutrition Interventions (ADVISE/ASSIST):  1. Small meals throughout the day:   A. Tuna/Chicken salad   B. Boiled eggs   C. Peanut butter on toast/crackers   D. Beans    ______________________________________________________________________  Joseph Phillips is a 58 y.o. male seen for medical nutrition therapy.    Referring MD or Clinic:   Self, Referred  Joseph Jeri Cos, MD    Reason for Referral:   No diagnosis found.    Medical History:  Past Medical History:   Diagnosis Date   ??? Hx of radiation therapy    ??? Hypertension    ??? Prostate cancer (CMS-HCC)        Anthropometrics:    Present Weight   Current BMI     Goal Weight   Current Current IBW     Present Height         Weight History:  Wt Readings from Last 6 Encounters:   04/26/19 73.9 kg (162 lb 14.4 oz)   03/29/19 72.9 kg (160 lb 11.2 oz)   03/10/19 72.6 kg (160 lb)   03/01/19 72.7 kg (160 lb 4.4 oz)   02/02/19 73.1 kg (161 lb 1.6 oz)   01/19/19 70.3 kg (155 lb)       Medications, Herbs, Supplements:  Reviewed nutritionally relevant medications and supplements.    Recent Labs:   Hemoglobin A1C (%)   Date Value   03/23/2014 4.8   04/17/2012 5.1      No components found for: LDLCALC   BP Readings from Last 3 Encounters:   04/26/19 126/70   03/29/19 118/67   03/11/19 121/70     Lab Results   Component Value Date    CHOL 98 (L) 09/26/2018    CHOL 125 03/23/2014    CHOL 129 09/11/2011     Lab Results   Component Value Date    HDL 36 (L) 09/26/2018    HDL 44 03/23/2014    HDL 45 09/11/2011     Lab Results   Component Value Date    LDL 33 (L) 09/26/2018    LDL <30 03/23/2014    LDL 62 09/11/2011     Lab Results   Component Value Date    VLDL 29.4 09/26/2018     Lab Results   Component Value Date    CHOLHDLRATIO 2.7 09/26/2018     Lab Results   Component Value Date    TRIG 147 09/26/2018    TRIG 142 09/11/2011       Physical Activity:  Tries to walk a little    Dietary Restrictions, Food Intolerances:  Bread-feels like it gets stuck    Other GI Issues:  Nausea  in the morning    Allergies:   Allergies   Allergen Reactions   ??? Penicillins      Other reaction(s): UNKNOWN   ??? 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    Behavioral Risk Factors:  Sleep- N/A  Fast Eating  N/A  Overeating  N/A    Hunger and Satiety Issues:  Appetite: Poor    Patient Questions:  None    Estimated Needs:  Estimated Energy Needs: 1800-2100 kcal/day    Estimated Protein Needs: 65-75 gm/day    Materials Provided To Meet their identified goals:  List of recommendations    Food labels discussed: Yes    Expected Compliance/Barriers are:   Comprehension of plan fair  Readiness for change fair  Ability to meet goals fair    We assessed family/social/cultural characteristics and these were relevant to care: none evident    Patient has auditory or visual communication barrier or need: no    Interventions Codes:  General/healthful diet     Follow-up (ARRANGE):  4 weeks      Length of visit was 30 minuntes. Patient understands diagnosis and treatment plan. Patient voices understanding. Plan is consistent with the patient???s preferences and goals.      Pt consented to phone visit  Pt notified that there may be a charge for this service  Pt in [place]  Best callback number confirmed with patient    Pt visit by phone in the setting of State of Emergency due to COVID-19 Pandemic

## 2019-05-05 NOTE — Unmapped (Addendum)
Called the Cancer Center and was transferred to Dr. Harrold Donath office. The staff there will check in to the possibility of Ensure for patient and get back with RD.    ----- Message from Suzi Roots, LCSW sent at 05/03/2019 10:38 AM EDT -----  Regarding: RE: Social Needs  Hey Lille Karim!    Do not the patient has been connected with the comprehensive cancer support center(p: [phone])?  They have a supply of Ensure for patients who are receiving cancer treatment.  I believe it is free to the patient.    Let me know if you would like for me to reach out to them regarding this patient or if you would like to provide him with the contact information for following up.    Thanks,  Amil Amen  ----- Message -----  From: Neldon Labella, RD/LDN  Sent: 05/03/2019  10:11 AM EDT  To: [facility]  Subject: Social Needs                                     Hey ladies!    So I did a visit with the patient today. He's a current cancer patient who is having difficulty eating. I'm wondering what is available for him in regards to getting Ensure free/low cost. Is there a med student we could connect him with to help with this??    Thanks!    Melvenia Beam

## 2019-05-10 NOTE — Unmapped (Signed)
Pt reports not taking as prescribed due to side effects.  Still has 3/4 bottle on hand. Pt experiencing nausea (11-14% chance ADR) and vomiting (<1% chance ADR) partly causing him to be non-compliant.  Reached out to provider if antiemetic pt would benefit from antiemetic.    [facility] Clinical Assessment & Refill Coordination Note    Wilburn Mylar, DOB: [birth year]  Phone: [phone] (home) [phone] (work)    All above HIPAA information was verified with patient.     Specialty Medication(s):   Hematology/Oncology: [name]     Current Outpatient Medications   Medication Sig Dispense Refill   ??? albuterol HFA 90 mcg/actuation inhaler Inhale 2 puffs every six (6) hours as needed for wheezing. 1 Inhaler 2   ??? amLODIPine (NORVASC) 10 MG tablet Take 1 tablet (10 mg total) by mouth daily. 30 tablet 11   ??? busPIRone (BUSPAR) 5 MG tablet TAKE 1 TABLET BY MOUTH THREE TIMES A DAY 270 tablet 3   ??? diclofenac sodium (VOLTAREN) 1 % gel Apply 2 g topically Four (4) times a day. Chest, area of pain 100 g 0   ??? diphenhydrAMINE (BENADRYL) 25 mg tablet Take 0.5 tablets (12.5 mg total) by mouth every six (6) hours as needed for itching. 30 capsule 0   ??? diphenhydramine HCl (MAGIC MOUTHWASH ORAL) suspension Take 10 mL by mouth 4 (four) times a day as needed. 120 mL 1   ??? enzalutamide (XTANDI) 40 mg capsule Take 2 capsules (80 mg total) by mouth daily. 60 capsule 2   ??? fentaNYL (DURAGESIC) 25 mcg/hr patch Place 1 patch on the skin every third day. 10 patch 0   ??? magnesium oxide (MAG-OX) 400 mg (241.3 mg magnesium) tablet Take 1 tablet (400 mg total) by mouth Two (2) times a day. 180 tablet 3   ??? omeprazole (PRILOSEC) 20 MG capsule take 2 capsule by mouth once daily 180 capsule 3   ??? oxyCODONE (ROXICODONE) 5 MG immediate release tablet Take 2 tablets (10 mg total) by mouth every four (4) hours as needed for pain. 100 tablet 0   ??? pancrelipase, Lip-Prot-Amyl, (CREON) 24,000-76,000 -120,000 unit CpDR delayed release capsule Take 48,000 units of lipase by mouth Three (3) times a day with a meal. Take an additional 1 capsule with snacks.     ??? prochlorperazine (COMPAZINE) 10 MG tablet Take 1 tablet (10 mg total) by mouth every eight (8) hours as needed. 30 tablet 11   ??? salmeteroL (SEREVENT) 50 mcg/dose diskus inhaler Inhale 1 puff Two (2) times a day. 1 Inhaler 12   ??? tiotropium (SPIRIVA WITH HANDIHALER) 18 mcg inhalation capsule Place 1 capsule (18 mcg total) into inhaler and inhale once daily. 90 capsule 3   ??? traZODone (DESYREL) 50 MG tablet Take 1 tablet (50 mg total) by mouth nightly as needed for sleep. 90 tablet 3   ??? valACYclovir (VALTREX) 500 MG tablet Take 1 tablet (500 mg total) by mouth daily. 90 tablet 3     No current facility-administered medications for this visit.         Changes to medications: [name] reports no changes at this time.    Allergies   Allergen Reactions   ??? Penicillins      Other reaction(s): UNKNOWN   ??? Tramadol Dizziness       Changes to allergies: No    SPECIALTY MEDICATION ADHERENCE     Xtandi 40 mg: over 16 days of medicine on hand  Medication Adherence    Support network for adherence:  family member          Specialty medication(s) dose(s) confirmed: Regimen is correct and unchanged.     Are there any concerns with adherence? Patient is not taking as prescribed due to side effects of nausea and vomiting    Adherence counseling provided? Not needed    CLINICAL MANAGEMENT AND INTERVENTION      Clinical Benefit Assessment:    Do you feel the medicine is effective or helping your condition? Yes    Clinical Benefit counseling provided? Progress note from 04/26/19 shows evidence of clinical benefit    Adverse Effects Assessment:    Are you experiencing any side effects? Yes, patient reports experiencing nausea and vomiting. Side effect counseling provided: will reach out to Dr. Claude Manges for nausea med    Are you experiencing difficulty administering your medicine? No    Quality of Life Assessment:    How many days over the past month did your cancer  keep you from your normal activities? For example, brushing your teeth or getting up in the morning. Patient declined to answer    Have you discussed this with your provider? Not needed    Therapy Appropriateness:    Is therapy appropriate? Yes, therapy is appropriate and should be continued    DISEASE/MEDICATION-SPECIFIC INFORMATION      N/A    PATIENT SPECIFIC NEEDS     ? Does the patient have any physical, cognitive, or cultural barriers? No    ? Is the patient high risk? No     ? Does the patient require a Care Management Plan? No     ? Does the patient require physician intervention or other additional services (i.e. nutrition, smoking cessation, social work)? No      SHIPPING     Specialty Medication(s) to be Shipped:   Hematology/Oncology: none    Other medication(s) to be shipped: none     Changes to insurance: No    Delivery Scheduled: Patient declined refill at this time due to having plenty on hand.     Medication will be delivered via NA to the confirmed NA address in [facility]    The patient will receive a drug information handout for each medication shipped and additional FDA Medication Guides as required.  Verified that patient has previously received a Conservation officer, historic buildings.    All of the patient's questions and concerns have been addressed.    Breck Coons [facility] Specialty Pharmacist

## 2019-05-11 NOTE — Unmapped (Signed)
As directed by Dr. Claude Manges, I have contacted the patient and reviewed with him the following:    ?? Patient has confirmed that over the past 4 days he has had an onset of nausea.  ?? There is not a clearly identifiable reason for the nausea as reported by the patient.  ?? The patient denies any other symptoms accompanying the nausea such as abdominal pain, fever, bowel irregularities, difficulty swallowing, reflux-like symptoms or urinary issues.  ?? Patient does report that the nausea is most noticeable in the morning and transitions to being resolved as the day continues.  ?? Occasionally, vomiting will be accompanied with the episode of nausea.    ?? In the past, the patient reports having used prochlorperazine 10 mg tablets that have effectively managed his nausea.  ?? The patient has been asked to hold off using the prochlorperazine unless absolutely necessary.  ?? Presently, the patient reports his last dose of prochlorperazine was 2-3 weeks ago.    ?? The patient has been advised to hold any further dosing of his enzalutamide effective 05/11/2019.  ?? Patient is to call the clinic back on 05/14/2019 to provide an update regarding the status of his nausea.  ?? The patient is to call the clinic back sooner should the nausea become worse or he have any other issues.    ?? In addition, the patient is also requesting a medication refill for the following:  ?? Oxycodone 5 mg immediate release tablets, dispense 100 with 0 refills.  ?? This prescription was last ordered on 04/21/2019.  ?? Upon review and approval by Dr. Claude Manges, the request will be completed electronically and sent to [facility] on [address] in [place], [place] as requested by the patient.    Otherwise, the patient expressed verbal understanding and had no other questions or concerns at this time and is agreeable to the plan outlined above.  The patient is to call the clinic back sooner should he have any other issues.    No other actions taken in Dr. Claude Manges is aware.

## 2019-05-11 NOTE — Unmapped (Signed)
Calling the patient to speak with him about complaint of nausea and vomiting on half dose Xtandi.  Either no answer or only voicemail boxes, some of which were not excepting messages reached at his 4 given numbers and his brothers to given numbers.  Left messages to asked the patient to contact us.

## 2019-05-12 MED ORDER — OXYCODONE 5 MG TABLET
ORAL_TABLET | ORAL | 0 refills | 0 days | Status: CP | PRN
Start: 2019-05-12 — End: 2019-05-27

## 2019-05-20 NOTE — Unmapped (Signed)
05/28-xtandi refill -pt states Dr took him off [place] for now due to nausea,and vomiting,per his request he would like [place] follow back up in 1 month to see if Dr Cynda Familia

## 2019-05-26 NOTE — Unmapped (Signed)
Unable to complete covid screening due to not having mailbox set up.

## 2019-05-27 ENCOUNTER — Institutional Professional Consult (permissible substitution): Admit: 2019-05-27 | Discharge: 2019-05-27 | Payer: MEDICAID

## 2019-05-27 ENCOUNTER — Ambulatory Visit
Admit: 2019-05-27 | Discharge: 2019-05-27 | Payer: MEDICAID | Attending: Hematology & Oncology | Primary: Hematology & Oncology

## 2019-05-27 ENCOUNTER — Ambulatory Visit: Admit: 2019-05-27 | Discharge: 2019-05-27 | Payer: MEDICAID

## 2019-05-27 DIAGNOSIS — C61 Malignant neoplasm of prostate: Principal | ICD-10-CM

## 2019-05-27 DIAGNOSIS — K852 Alcohol induced acute pancreatitis without necrosis or infection: Secondary | ICD-10-CM

## 2019-05-27 LAB — LACTATE DEHYDROGENASE: Lactate dehydrogenase:CCnc:Pt:Ser/Plas:Qn:: 763 — ABNORMAL HIGH

## 2019-05-27 LAB — CBC W/ AUTO DIFF
BASOPHILS ABSOLUTE COUNT: 0.1 10*9/L (ref 0.0–0.1)
EOSINOPHILS ABSOLUTE COUNT: 0 10*9/L (ref 0.0–0.4)
HEMATOCRIT: 32.4 % — ABNORMAL LOW (ref 41.0–53.0)
HEMOGLOBIN: 11.6 g/dL — ABNORMAL LOW (ref 13.5–17.5)
LARGE UNSTAINED CELLS: 3 % (ref 0–4)
LYMPHOCYTES ABSOLUTE COUNT: 1.8 10*9/L (ref 1.5–5.0)
LYMPHOCYTES RELATIVE PERCENT: 34.3 %
MEAN CORPUSCULAR HEMOGLOBIN CONC: 35.8 g/dL (ref 31.0–37.0)
MEAN CORPUSCULAR HEMOGLOBIN: 34.2 pg — ABNORMAL HIGH (ref 26.0–34.0)
MEAN CORPUSCULAR VOLUME: 95.7 fL (ref 80.0–100.0)
MEAN PLATELET VOLUME: 7.1 fL (ref 7.0–10.0)
MONOCYTES ABSOLUTE COUNT: 0.4 10*9/L (ref 0.2–0.8)
MONOCYTES RELATIVE PERCENT: 7.3 %
NEUTROPHILS ABSOLUTE COUNT: 2.9 10*9/L (ref 2.0–7.5)
NEUTROPHILS RELATIVE PERCENT: 53.5 %
PLATELET COUNT: 197 10*9/L (ref 150–440)
RED BLOOD CELL COUNT: 3.39 10*12/L — ABNORMAL LOW (ref 4.50–5.90)
RED CELL DISTRIBUTION WIDTH: 14 % (ref 12.0–15.0)
WBC ADJUSTED: 5.4 10*9/L (ref 4.5–11.0)

## 2019-05-27 LAB — HEPATIC FUNCTION PANEL
ALBUMIN: 3.9 g/dL (ref 3.5–5.0)
ALKALINE PHOSPHATASE: 334 U/L — ABNORMAL HIGH (ref 38–126)
ALT (SGPT): 23 U/L (ref <50)
BILIRUBIN DIRECT: <0.1 mg/dL (ref 0.00–0.40)
BILIRUBIN TOTAL: 0.4 mg/dL (ref 0.0–1.2)
PROTEIN TOTAL: 6.9 g/dL (ref 6.5–8.3)

## 2019-05-27 LAB — MONOCYTES RELATIVE PERCENT: Lab: 7.3

## 2019-05-27 LAB — MAGNESIUM: Magnesium:MCnc:Pt:Ser/Plas:Qn:: 1.9

## 2019-05-27 LAB — ALKALINE PHOSPHATASE: Alkaline phosphatase:CCnc:Pt:Ser/Plas:Qn:: 334 — ABNORMAL HIGH

## 2019-05-27 LAB — LIPASE: Triacylglycerol lipase:CCnc:Pt:Ser/Plas:Qn:: 74

## 2019-05-27 LAB — PROSTATE SPECIFIC ANTIGEN: Prostate specific Ag:MCnc:Pt:Ser/Plas:Qn:: 103 — ABNORMAL HIGH

## 2019-05-27 MED ORDER — OXYCODONE 5 MG TABLET
ORAL_TABLET | ORAL | 0 refills | 0.00000 days | Status: CP | PRN
Start: 2019-05-27 — End: 2019-06-23

## 2019-05-27 MED ORDER — ENZALUTAMIDE 40 MG CAPSULE
ORAL_CAPSULE | Freq: Every day | ORAL | 5 refills | 0 days | Status: CP
Start: 2019-05-27 — End: 2019-06-28
  Filled 2019-05-28: qty 90, 30d supply, fill #0

## 2019-05-27 MED ORDER — FENTANYL 25 MCG/HR TRANSDERMAL PATCH
MEDICATED_PATCH | TRANSDERMAL | 0 refills | 0 days | Status: CP
Start: 2019-05-27 — End: 2019-08-16

## 2019-05-27 NOTE — Unmapped (Addendum)
Initial Visit Note    Patient Name: Joseph Phillips  Patient Age: 58 y.o.  Encounter Date: 05/27/2019    Referring Physician:     Towanda Malkin, MD  [address]  [id number]  [place], [place] [address]    Primary Care Provider:  Unknown Foley, MD    Assessment/Plan:    Reason for visit:  Prostate Cancer    Cancer Staging  Prostate cancer (CMS-HCC)  Staging form: Prostate, AJCC 7th Edition  - Clinical: Stage IV (T3a, N0, M1b, PSA: Less than 10, Gleason 7) - Unsigned      --Prostate cancer, stage IV,      -- with progression on Lupron/Zytiga     --07/14/18: Docetaxel/prednisone     --11/11/2018: Neutropenic fever, with possible UTI and oral mucositis     -- 02/03/19: [name];  Held 03/01/19 for LE swelling/cellulitis; restart 03/29/19 at 80 mg qD  --Hypokalemia  --Hypomagnesemia  --Leg cramps and pain, exacerbated by electrolyte disturbances      --03/01/19: Skin changes in the lower extremities of uncertain etiology.  --Teeth in poor repair, awaiting dental correction  --Hypertension  --Lower extremity swelling, exacerbated at night; improved on triamterene-HCTZ  -- OSA; positive study for both central and obstructive apnea; CPAP trial recommended, 07/23/2018; deferred by the patient  --History of pancreatitis with pancreatic insufficiency; on Creon      Recommendation  --Patient will restart Xtandi at 120 mg daily.  He will contact us if he is not tolerating this.  He is also been given permission to restart Compazine.  -- He received Xgeva today.  --I suspect he is drinking more than just a couple beers daily, and these may be contributing to his abdominal discomfort, diarrhea and nausea.  I have asked him to limit alcohol intake to 1 can of beer daily.  -- Will plan to see back in 1 month; will consider dose escalation of Xtandi.  -- Admonished to quit smoking.  --Oxycodone and fentanyl were renewed today      Patient instructions  - Restart Xtandi at 3 tablets daily  -- You may take Compazine as needed for nausea.  -- Limit alcohol intake to 1 beer daily.  -- Return in 1 month for Xgeva and re-evaluation.    06/09/2019: Patient reports extreme pain with inflammation in 1 ankle.  Will place on indomethacin for presumed gout, unless the patient can come in for evaluation first.      I have reviewed the laboratory, pathology, and radiology reports in detail and discussed findings with the patient    History of Present Illness:     Joseph Phillips is a 58 y.o. male who is seen in consultation at the request of Lonn Georgia* for an evaluation of Prostate Cancer.  Patient returns with some epigastric discomfort and intermittent nausea and vomiting.  He believes his pancreatitis is acting up again.  He admits to drinking beer, is unable to estimate the amount.  It is noted that his ALT has become more elevated.  Sometime after we saw the patient, he says that the pharmacy told him that he should stop taking Xtandi.  He is not certain why.  He said he was also told to stop taking any antinausea medication; again he is not certain why.  He has not been on either these for at least 2 and possibly as long as 3-1/2 weeks.  Not having any difficulty passing urine.  He continues to smoke.  He would like  his pain medications refilled; he feels the fentanyl is working well for him.  He denies fevers, shaking chills, headaches, visual changes, hot flashes.  He occasionally has diarrhea.  He denies bleeding symptoms.      Oncology History:  Patient was originally diagnosed with prostate cancer in 2006.  He underwent robot-assisted prostatectomy.  He did well until May 2018, when his PSA rose to a high of about 45, and he was started on Lupron, initially with Casodex, and then with apparatus.  His PSA fell and remained low until recently when he was noted to be rising from a low of 0.82 on  01/01/18, to 3.99 on 06/29/2018.  He was instructed to hold Zytiga.  Since holding Zytiga he is noted swelling in his ankles.  The swelling comes on overnight, improves during the course the day.  He was evaluated with Doppler studies on 06/29/2018 which showed no evidence of blood clots.  He is still bothered by the swelling.  Restaging studies done on 06/04/2018 including bone scan, and CT of the chest abdomen pelvis, showed sclerotic changes throughout the axial skeleton, and involvement also of scapula, some ribs and pelvis.  The sites were new since 2016, and also probably new since the CT stone study in May 2018.  Bone scan also showed some progression in disease by report, compared to 2018.  He has developed pain in the back of his calves, for which he takes oxycodone (Tylenol and ibuprofen were ineffective).  The pain followed prolonged episodes of cramping, felt to be related to low magnesium and potassium.  About 6 months ago he had shingles in a left L2 distribution.           Prostate cancer (CMS-HCC)    12/02/2008 Biopsy     Prostate, left apex, needle biopsy  - Adenocarcinoma, Gleason combined score 6 (3+3), 2 of 2 cores involved, tumor  approximately 2 mm in greatest diameter, approximately 10% of tissue involved by  carcinoma.  - See comment    E: ??Prostate, left mid, needle biopsy  - Adenocarcinoma, Gleason combined score 6 (3+3), 1 of 2 cores involved, tumor  approximately 2 mm in greatest diameter, approximately 5% of tissue involved by  carcinoma.  - See comment    F: ??Prostate, left base, needle biopsy  - Adenocarcinoma, Gleason combined score 6 (3+3), 1 of 2 cores involved, tumor  approximately 1 mm in greatest diameter, approximately 5% of tissue involved by  carcinoma.      01/24/2009 Surgery     Prostate, robot assisted laparoscopic prostatectomy  Tumor histologic type: Prostatic adenocarcinoma  Tumor grade (Gleason system): Gleason pattern 7 (3+4)  Approximate percentage of prostate involved by tumor: 40%  Extent of tumor:  Location of tumor involvement in prostate: right and left lobes, anterior  and posterior, apex to base Confinement/non-confinement of tumor within the prostatic capsule: ??not  confined  Location of extracapsular tumor involvement: ??left prostate toward the base  (ie. block A22)  Type of extracapsular tissue involved within tumor: ?? adipose  tissue/perineural tissue  Angiolymphatic space invasion: not identified  Seminal vesicles: negative  Vas deferens: negative  Histologic assessment of surgical margins: positive margin along a 3 mm  face in the left prostate base (block A25)    Other significant findings: High grade prostatic intraepithelial  Neoplasm;  Lymph nodes: Not applicable  AJCC Staging: pT3a pNx      08/30/2013 Initial Diagnosis     Prostate cancer (CMS-HCC)  04/30/2017 -  Chemotherapy     Lupron 22.5 mg; with bicalutamide x  month      08/01/2017 - 06/30/2018 Chemotherapy     Abiaterone 1000 mg daily; decrease to 750 mg daily at 10/2018;      10/30/2017 -  Cancer Staged     Starta: TP53, TMPSSR  -EGR fusion; no other abnormalities identified      01/14/2018 -  Cancer Staged     CT Chest:  Numerous sclerotic metastases of the thoracic spine, left scapula  and multiple ribs.  3. Coronary artery and aortic atherosclerosis (ICD10-I70.0).  4. Previously demonstrated left renal mass is not included in the  field of view.      06/04/2018 -  Cancer Staged     CT CAP:  Scattered diffuse sclerotic metastatic lesions are seen throughout the spine as well as the left scapula; some of the lesions are new since prior CT dated 04/20/2015.  - No intrathoracic metastatic disease.  Interval progression of osseous metastases throughout the spine and pelvis compared to 04/22/2017.  -- Cholelithiasis.  -- Hepatic steatosis.      06/04/2018 -  Cancer Staged     Bone scan:  Increased radiotracer uptake throughout the spine, sacrum, and left ilium, consistent with numerous metastases better evaluated on same-day CT. Unchanged metastasis to the left proximal humerus.      07/14/2018 -  Chemotherapy     OP PROSTATE DOCETAXEL/PREDNISONE Docetaxel 75 mg/m2 evry 21 days      01/07/2019 -  Cancer Staged     CTA Chest:  No evidence of pulmonary embolism.  2. Occlusion of the medial basilar segmental bronchus of the left  lower lobe, nonspecific though could be seen in the setting of  aspiration. Otherwise, no acute findings.  3. Apical predominant paraseptal emphysematous change with suspected  mild progression of smoking related fibrosis with subpleural  reticulation but without evidence of honeycombing. Emphysema  (ICD10-J43.9).  4. Progression of osseous metastatic disease since 11/2017. Detailed  above. No definitive pathologic compression fractures.  5. Hepatic steatosis. 6. Coronary artery calcifications. Aortic Atherosclerosis      01/20/2019 -  Cancer Staged     NM Bone scan: Redemonstrated multiple abnormal foci throughout the appendicular axial skeleton, with interval increase in the extent of the T10 vertebral body lesion and new lesions involving the manubrium, left fifth and seventh ribs. This is most consistent with progression of osseous metastatic disease.      02/03/2019 -  Chemotherapy     Xtandi 160 mg qD; held after 1 month due to swelling.  Restart at 80 mg qD on 03/29/19      03/10/2019 -  Cancer Staged     CTA chest:  No evidence of acute aortic intramural hematoma or dissection.  Multifocal osseous sclerotic metastatic disease, progressed when compared to prior CT 06/04/2018 and grossly similar to most recent nuclear medicine bone scan.  Mucoid impacted airways in left lower lobe with adjacent subtle ground glass opacities; likely represents sequelae of aspiration. Recommend follow up CT in 6 to 8 weeks to ensure clearing.  Other chronic/incidental findings as above, not substantially changed.         Past Medical History:   Diagnosis Date   ??? Hx of radiation therapy    ??? Hypertension    ??? Prostate cancer (CMS-HCC)       Past Surgical History:   Procedure Laterality Date   ??? IR INSERT PORT AGE GREATER THAN 5 YRS  07/15/2018    IR INSERT PORT AGE GREATER THAN 5 YRS 07/15/2018 Carolin Coy, MD [facility]   ??? PR COLSC FLX W/RMVL OF TUMOR POLYP LESION SNARE TQ N/A 08/16/2015    Procedure: COLONOSCOPY FLEX; W/REMOV TUMOR/LES BY SNARE;  Surgeon: Gwen Pounds, MD;  [facility]: [facility];  Service: Gastroenterology   ??? PR COLSC FLX WITH DIRECTED SUBMUCOSAL NJX ANY SBST N/A 08/16/2015    Procedure: COLONOSCOPY, FLEXIBLE, PROXIMAL TO SPLENIC FLEXURE; WITH DIRECTED SUBMUCOSAL INJECTION(S), ANY SUBSTANCE;  Surgeon: Gwen Pounds, MD;  Location: [facility];  Service: Gastroenterology   ??? PROSTATE SURGERY          Family History   Problem Relation Age of Onset   ??? Melanoma Neg Hx    ??? Basal cell carcinoma Neg Hx    ??? Squamous cell carcinoma Neg Hx       No family status information on file.     Social History     Occupational History   ??? Not on file   Tobacco Use   ??? Smoking status: Current Some Day Smoker     Packs/day: 0.20     Years: 21.00     Pack years: 4.20     Types: Cigarettes     Start date: 05/09/1994   ??? Smokeless tobacco: Never Used   ??? Tobacco comment: smoking anywhere from 0-3 cigarettes daily   Substance and Sexual Activity   ??? Alcohol use: Yes     Alcohol/week: 2.0 standard drinks     Types: 2 Cans of beer per week   ??? Drug use: No   ??? Sexual activity: Not Currently       Allergies   Allergen Reactions   ??? Penicillins      Other reaction(s): UNKNOWN   ??? Tramadol Dizziness       Current Outpatient Medications   Medication Sig Dispense Refill   ??? albuterol HFA 90 mcg/actuation inhaler Inhale 2 puffs every six (6) hours as needed for wheezing. 1 Inhaler 2   ??? amLODIPine (NORVASC) 10 MG tablet Take 1 tablet (10 mg total) by mouth daily. 30 tablet 11   ??? busPIRone (BUSPAR) 5 MG tablet TAKE 1 TABLET BY MOUTH THREE TIMES A DAY 270 tablet 3   ??? diclofenac sodium (VOLTAREN) 1 % gel Apply 2 g topically Four (4) times a day. Chest, area of pain 100 g 0   ??? diphenhydrAMINE (BENADRYL) 25 mg tablet Take 0.5 tablets (12.5 mg total) by mouth every six (6) hours as needed for itching. 30 capsule 0   ??? diphenhydramine HCl (MAGIC MOUTHWASH ORAL) suspension Take 10 mL by mouth 4 (four) times a day as needed. 120 mL 1   ??? enzalutamide (XTANDI) 40 mg capsule Take 2 capsules (80 mg total) by mouth daily. 60 capsule 2   ??? fentaNYL (DURAGESIC) 25 mcg/hr patch Place 1 patch on the skin every third day. 10 patch 0   ??? magnesium oxide (MAG-OX) 400 mg (241.3 mg magnesium) tablet Take 1 tablet (400 mg total) by mouth Two (2) times a day. 180 tablet 3   ??? omeprazole (PRILOSEC) 20 MG capsule take 2 capsule by mouth once daily 180 capsule 3   ??? oxyCODONE (ROXICODONE) 5 MG immediate release tablet Take 2 tablets (10 mg total) by mouth every four (4) hours as needed for pain. 100 tablet 0   ??? pancrelipase, Lip-Prot-Amyl, (CREON) 24,000-76,000 -120,000 unit CpDR delayed release capsule Take 48,000  units of lipase by mouth Three (3) times a day with a meal. Take an additional 1 capsule with snacks.     ??? prochlorperazine (COMPAZINE) 10 MG tablet Take 1 tablet (10 mg total) by mouth every eight (8) hours as needed. 30 tablet 11   ??? salmeteroL (SEREVENT) 50 mcg/dose diskus inhaler Inhale 1 puff Two (2) times a day. 1 Inhaler 12   ??? tiotropium (SPIRIVA WITH HANDIHALER) 18 mcg inhalation capsule Place 1 capsule (18 mcg total) into inhaler and inhale once daily. 90 capsule 3   ??? traZODone (DESYREL) 50 MG tablet      ??? valACYclovir (VALTREX) 500 MG tablet Take 1 tablet (500 mg total) by mouth daily. 90 tablet 3   ??? traZODone (DESYREL) 50 MG tablet Take 1 tablet (50 mg total) by mouth nightly as needed for sleep. 90 tablet 3     Current Facility-Administered Medications   Medication Dose Route Frequency Provider Last Rate Last Dose   ??? heparin, porcine (PF) 100 unit/mL injection 500 Units  500 Units Intravenous Q30 Min PRN Towanda Malkin, MD   500 Units at 05/27/19 0920       The following portions of the patient's history were reviewed and updated as appropriate: allergies, current medications, past family history, past medical history, past social history, past surgical history and problem list.     Review of Systems   Constitutional: Positive for fatigue. Negative for appetite change, chills, diaphoresis, fever and unexpected weight change.   HENT:   Negative for mouth sores, nosebleeds and trouble swallowing.    Eyes: Negative for eye problems.   Respiratory: Negative for chest tightness, cough, hemoptysis and shortness of breath.    Cardiovascular: Negative for chest pain and leg swelling.   Gastrointestinal: Positive for abdominal pain, diarrhea, nausea and vomiting. Negative for blood in stool and constipation.   Genitourinary: Negative for difficulty urinating and hematuria.    Musculoskeletal: Positive for back pain and myalgias. Negative for arthralgias.   Skin: Negative for rash.   Neurological: Negative for extremity weakness, headaches and seizures.   Hematological: Negative for adenopathy.   Psychiatric/Behavioral: Negative for confusion.   All other systems reviewed and are negative.        Vital Signs for this encounter:  BSA: 1.9 meters squared  BP 135/81  - Pulse 91  - Temp 36.6 ??C (97.9 ??F) (Tympanic)  - Resp 18  - Wt 74.9 kg (165 lb 1.6 oz)  - SpO2 100%  - BMI 25.10 kg/m??     Physical Exam   Constitutional: He is oriented to person, place, and time.   Thin male, in no acute distress.   HENT:   Head: Normocephalic.   Eyes: Pupils are equal, round, and reactive to light. EOM are normal.   Cardiovascular: Normal rate, regular rhythm and normal heart sounds.   Pulmonary/Chest: Effort normal and breath sounds normal.   Abdominal: Soft. Bowel sounds are normal. He exhibits no distension and no mass. There is abdominal tenderness.   Patient is tender in the right upper and midline abdomen to the patient.  No masses, guarding noted.   Musculoskeletal:         General: No tenderness.      Comments: There is trace  edema in bilateral feet and lower extremities about midway up the calf.  Skin is bound down.  No erythema, tenderness.   Lymphadenopathy:     He has no cervical adenopathy.   No adenopathy neck, supraclavicular,  axillary inguinal regions.   Neurological: He is alert and oriented to person, place, and time.   Skin: No rash noted.   Psychiatric: He has a normal mood and affect. His behavior is normal.       Karnofsky/Lansky Performance Status  80, Normal activity with effort; some signs or symptoms of disease (ECOG equivalent 1)    Results:    WBC   Date Value Ref Range Status   05/27/2019 5.4 4.5 - 11.0 10*9/L Final   04/17/2012 7.6 4.5 - 11.0 x10 9th/L Final     HGB   Date Value Ref Range Status   05/27/2019 11.6 (L) 13.5 - 17.5 g/dL Final   16/09/9603 54.0 13.5 - 17.5 G/DL Final     HCT   Date Value Ref Range Status   05/27/2019 32.4 (L) 41.0 - 53.0 % Final   04/17/2012 39.2 (L) 41.0 - 53.0 % Final     Platelet   Date Value Ref Range Status   05/27/2019 197 150 - 440 10*9/L Final   04/17/2012 242 150 - 440 x10 9th/L Final     Creatinine Whole Blood, POC   Date Value Ref Range Status   07/25/2014 1.6 (H) 0.8 - 1.4 mg/dL Final     Creatinine, Whole Blood   Date Value Ref Range Status   07/25/2014 1.4 0.8 - 1.4 mg/dL Final     Creatinine   Date Value Ref Range Status   04/26/2019 0.76 0.70 - 1.30 mg/dL Final   98/10/9146 8.29 0.70 - 1.30 mg/dL Final   56/21/3086 5.78 0.70 - 1.30 mg/dL Final     AST   Date Value Ref Range Status   04/26/2019 67 (H) 19 - 55 U/L Final   09/11/2011 38 19 - 55 U/L Final     PSA Diagnostic   Date Value Ref Range Status   09/28/2014 4.4 (H) 0.0 - 4.0 ng/mL Final   05/30/2014 5.0 (H) 0.0 - 4.0 ng/mL Final   03/23/2014 4.6 (H) 0.0 - 4.0 ng/mL Final   10/04/2013 3.7 0.0 - 4.0 ng/mL Final   06/24/2013 2.6 0.0 - 4.0 ng/mL Final   01/18/2013 1.6 0.0 - 4.0 NG/ML Final   09/28/2012 1.5 0.0 - 4.0 NG/ML Final   07/03/2012 1.0 0.0 - 4.0 NG/ML Final   12/30/2011 0.5 0.0 - 4.0 NG/ML Final   07/15/2011 0.3 0.0 - 4.0 NG/ML Final 04/08/2011 0.3 0.0 - 4.0 NG/ML Final

## 2019-05-27 NOTE — Unmapped (Signed)
Outpatient SW Note    Referral Source: [facility] [place]      Patient Status and Psychosocial Issues:    Joseph Phillips is a 58 year old male who is being treated for Stage IV prostate cancer at [facility]. Pt is requesting assistance with resource development, re: Ensure for nutrition     Coping Issues/Concerns:  SW spoke with patient at length. Pt states that he has requested Ensure in the past but has not received it. SW inquired about if he had a prescription for Ensure written by a dietician and he stated that he did not know. Sw reviewed chart and did not find a current order for Ensure. SW contacted [facility] dieticians Victorino Dike Spring and Neila Gear (left vm) and could not confirm that there was a prescription for this patient.       Action Taken:  SW inquired with Neila Gear to get further guidance. SW was able to confirm that patient did not at her last contact meet medical necessity for Ensure, however, that could have changed and patient will be evaluated further. SW will reach out to patient for other issues but SW will consider this current need in process of being addressed.   Outside Agency Contacts:    Follow-up planned:  05/28/2019

## 2019-05-27 NOTE — Unmapped (Signed)
Pt received Lupron injection in right gluteal - pt tolerated well.    AVS received at scheduling.    Pt stable and ambulated independently from clinic.

## 2019-05-27 NOTE — Unmapped (Signed)
Change in [place] dosage increase. Co-pay $3.00.

## 2019-05-27 NOTE — Unmapped (Signed)
[name] here for followup with Dr. Claude Manges.    Weight: Stable  Energy Level/Fatigue/Active Level: Low, better than it was yesterday - pt walks occasionally  Elimination/Discharge: Pt states that stomach has been upset.  Nausea/Vomitting: Occasional nausea   Appetite/Diet: Fair - Poor - one day I might eat good and go two days without eating    Pain/Swelling: No pain right now due to pain medication helping.   Skin: Intact  Psychosocial: No needs  Refills of Meds: Fentanyl patch and oxycodone    Pt's port accessed - flushed, bld return noted, labs drawn and heplocked. Pt tolerated well.  Port needle removed prior to patient leaving clinic.    Pt received Lupron injection in right gluteal by Leane Call - pt tolerated well.    Notified Dr. Claude Manges.

## 2019-05-27 NOTE — Unmapped (Addendum)
CARE MANAGEMENT ENCOUNTER    LCSW notified regarding pt's challenge with affording ensure.    Call placed to [facility](p: [phone]) and a voicemail was left for SW Sherran Needs with a request for call back to see if there may be a supply there that pt can be offered.    Call placed to [facility](p: [phone]) to find that the center is currently closed and it is not recommended to leave a voicemail at this time.     LCSW later received a call back from Joseph Phillips who shared that patient's social worker with the [facility] is named Joseph Phillips(p: [phone]).  LCSW was encouraged to reach out to Joseph Needle to follow-up and see if Ensure supply could be offered to the patient.    LCSW to share update with Joseph Phillips, RD.    LCSW later received a call back from Joseph Phillips who shared that he would be able to follow up w/the pt as he has worked w/the pt in the past.     Time Spent: 20 minutes    Lennice Sites. Curly Rim, MSW, LCSW  [facility]  Phone: [phone]  Pager: [phone]

## 2019-05-27 NOTE — Unmapped (Addendum)
--   Restart Xtandi at 3 tablets daily  -- You may take Compazine as needed for nausea.  -- Limit alcohol intake to 1 beer daily.  -- Return in 1 month for Xgeva and re-evaluation.

## 2019-05-27 NOTE — Unmapped (Signed)
Error

## 2019-05-28 MED FILL — XTANDI 40 MG CAPSULE: 30 days supply | Qty: 90 | Fill #0 | Status: AC

## 2019-05-28 NOTE — Unmapped (Signed)
[facility] Clinical Assessment & Refill Coordination Note    Joseph Phillips, DOB: [birth year]  Phone: [phone] (home) [phone] (work)    All above HIPAA information was verified with patient.     Specialty Medication(s):   Hematology/Oncology: [name]     Current Outpatient Medications   Medication Sig Dispense Refill   ??? albuterol HFA 90 mcg/actuation inhaler Inhale 2 puffs every six (6) hours as needed for wheezing. 1 Inhaler 2   ??? amLODIPine (NORVASC) 10 MG tablet Take 1 tablet (10 mg total) by mouth daily. 30 tablet 11   ??? busPIRone (BUSPAR) 5 MG tablet TAKE 1 TABLET BY MOUTH THREE TIMES A DAY 270 tablet 3   ??? diclofenac sodium (VOLTAREN) 1 % gel Apply 2 g topically Four (4) times a day. Chest, area of pain 100 g 0   ??? diphenhydrAMINE (BENADRYL) 25 mg tablet Take 0.5 tablets (12.5 mg total) by mouth every six (6) hours as needed for itching. 30 capsule 0   ??? diphenhydramine HCl (MAGIC MOUTHWASH ORAL) suspension Take 10 mL by mouth 4 (four) times a day as needed. 120 mL 1   ??? enzalutamide (XTANDI) 40 mg capsule Take 3 capsules (120 mg total) by mouth daily. 90 capsule 5   ??? fentaNYL (DURAGESIC) 25 mcg/hr patch Place 1 patch on the skin every third day. 10 patch 0   ??? magnesium oxide (MAG-OX) 400 mg (241.3 mg magnesium) tablet Take 1 tablet (400 mg total) by mouth Two (2) times a day. 180 tablet 3   ??? omeprazole (PRILOSEC) 20 MG capsule take 2 capsule by mouth once daily 180 capsule 3   ??? oxyCODONE (ROXICODONE) 5 MG immediate release tablet Take 2 tablets (10 mg total) by mouth every four (4) hours as needed for pain. 100 tablet 0   ??? pancrelipase, Lip-Prot-Amyl, (CREON) 24,000-76,000 -120,000 unit CpDR delayed release capsule Take 48,000 units of lipase by mouth Three (3) times a day with a meal. Take an additional 1 capsule with snacks.     ??? prochlorperazine (COMPAZINE) 10 MG tablet Take 1 tablet (10 mg total) by mouth every eight (8) hours as needed. 30 tablet 11   ??? salmeteroL (SEREVENT) 50 mcg/dose diskus inhaler Inhale 1 puff Two (2) times a day. 1 Inhaler 12   ??? tiotropium (SPIRIVA WITH HANDIHALER) 18 mcg inhalation capsule Place 1 capsule (18 mcg total) into inhaler and inhale once daily. 90 capsule 3   ??? traZODone (DESYREL) 50 MG tablet Take 1 tablet (50 mg total) by mouth nightly as needed for sleep. 90 tablet 3   ??? traZODone (DESYREL) 50 MG tablet      ??? valACYclovir (VALTREX) 500 MG tablet Take 1 tablet (500 mg total) by mouth daily. 90 tablet 3     No current facility-administered medications for this visit.         Changes to medications: Joseph Phillips reports no changes at this time.    Allergies   Allergen Reactions   ??? Penicillins      Other reaction(s): UNKNOWN   ??? Tramadol Dizziness       Changes to allergies: No    SPECIALTY MEDICATION ADHERENCE     Xtandi 40 mg: 0 days of medicine on hand     Medication Adherence    Patient reported X missed doses in the last month:  0  Specialty Medication:  Xtandi 40mg   Support network for adherence:  family member  Specialty medication(s) dose(s) confirmed: Patient reports changes to the regimen as follows: New dose is 120mg  po qd - patient is aware of and expecting this dose     Are there any concerns with adherence? No    Adherence counseling provided? Not needed    CLINICAL MANAGEMENT AND INTERVENTION      Clinical Benefit Assessment:    Do you feel the medicine is effective or helping your condition? Yes    Clinical Benefit counseling provided? Not needed    Adverse Effects Assessment:    Are you experiencing any side effects? No    Are you experiencing difficulty administering your medicine? No    Quality of Life Assessment:    How many days over the past month did your prostate cancer  keep you from your normal activities? For example, brushing your teeth or getting up in the morning. Patient declined to answer    Have you discussed this with your provider? Not needed    Therapy Appropriateness:    Is therapy appropriate? Yes, therapy is appropriate and should be continued    DISEASE/MEDICATION-SPECIFIC INFORMATION      N/A    PATIENT SPECIFIC NEEDS     ? Does the patient have any physical, cognitive, or cultural barriers? No    ? Is the patient high risk? No     ? Does the patient require a Care Management Plan? No     ? Does the patient require physician intervention or other additional services (i.e. nutrition, smoking cessation, social work)? No      SHIPPING     Specialty Medication(s) to be Shipped:   Hematology/Oncology: Xtandi 40mg     Other medication(s) to be shipped: none       Changes to insurance: No    Delivery Scheduled: Yes, Expected medication delivery date: 05/29/2019.     Medication will be delivered via Next Day Courier to the confirmed prescription address in [facility].    The patient will receive a drug information handout for each medication shipped and additional FDA Medication Guides as required.  Verified that patient has previously received a Conservation officer, historic buildings.    All of the patient's questions and concerns have been addressed.    Karene Fry Tayshawn Purnell   [facility] Specialty Pharmacist

## 2019-05-31 ENCOUNTER — Institutional Professional Consult (permissible substitution): Admit: 2019-05-31 | Discharge: 2019-06-01 | Payer: MEDICAID | Attending: Registered" | Primary: Registered"

## 2019-05-31 DIAGNOSIS — C61 Malignant neoplasm of prostate: Principal | ICD-10-CM

## 2019-05-31 NOTE — Unmapped (Signed)
I was the supervising physician in the delivery of the service. Mason Dibiasio E Kimel-Scott, MD

## 2019-05-31 NOTE — Unmapped (Signed)
Patient Stated Health/Nutrition Goals:  Meet nutritional needs   Reduce long-term health risk    Identified treatment goals: Not applicable    Nutrition Interventions (ADVISE/ASSIST):  1. When you first get up have a few crackers and a small glass of Ginger Ale  2. Continue to reduce alcohol: preferably none at all  3. Try to have small snacks throughout the day:   A. Peanut butter on crackers   B. Tuna/chicken salad on crackers   C. Cheese toast   D. Canned fruit   E. Vegetables

## 2019-05-31 NOTE — Unmapped (Signed)
Enhanced [facility] Nutrition-Follow Up Phone Assessment    Nutrition Assessment:  Patient was just getting up at the time of the call. Patient apparently didn't eat yesterday due to nausea. We discussed how keeping a little something on his stomach might help his nausea. Patient would like the coupons sent to his house.    Nutrition Diagnosis:  Undesirable food choices related to poor appetite as evidenced by skipping meals, eating highly processed foods    Patient Stated Health/Nutrition Goals:  Meet nutritional needs   Reduce long-term health risk    Identified treatment goals: Not applicable    Nutrition Interventions (ADVISE/ASSIST):  1. When you first get up have a few crackers and a small glass of Ginger Ale  2. Continue to reduce alcohol: preferably none at all  3. Try to have small snacks throughout the day:   A. Peanut butter on crackers   B. Tuna/chicken salad on crackers   C. Cheese toast   D. Canned fruit   E. Vegetables    ______________________________________________________________________  Joseph Phillips is a 58 y.o. male seen for medical nutrition therapy.    Referring MD or Clinic:   Self, Referred  Max Jeri Cos, MD    Reason for Referral:   No diagnosis found.    Medical History:  Past Medical History:   Diagnosis Date   ??? Hx of radiation therapy    ??? Hypertension    ??? Prostate cancer (CMS-HCC)        Anthropometrics:    Present Weight   Current BMI     Goal Weight   Current Current IBW     Present Height         Weight History:  Wt Readings from Last 6 Encounters:   05/27/19 74.9 kg (165 lb 1.6 oz)   04/26/19 73.9 kg (162 lb 14.4 oz)   03/29/19 72.9 kg (160 lb 11.2 oz)   03/10/19 72.6 kg (160 lb)   03/01/19 72.7 kg (160 lb 4.4 oz)   02/02/19 73.1 kg (161 lb 1.6 oz)       Medications, Herbs, Supplements:  Reviewed nutritionally relevant medications and supplements.    Recent Labs:   Hemoglobin A1C (%)   Date Value   03/23/2014 4.8   04/17/2012 5.1      No components found for: LDLCALC   BP Readings from Last 3 Encounters:   05/27/19 135/81   04/26/19 126/70   03/29/19 118/67     Lab Results   Component Value Date    CHOL 98 (L) 09/26/2018    CHOL 125 03/23/2014    CHOL 129 09/11/2011     Lab Results   Component Value Date    HDL 36 (L) 09/26/2018    HDL 44 03/23/2014    HDL 45 09/11/2011     Lab Results   Component Value Date    LDL 33 (L) 09/26/2018    LDL <30 03/23/2014    LDL 62 09/11/2011     Lab Results   Component Value Date    VLDL 29.4 09/26/2018     Lab Results   Component Value Date    CHOLHDLRATIO 2.7 09/26/2018     Lab Results   Component Value Date    TRIG 147 09/26/2018    TRIG 142 09/11/2011       Physical Activity:  Tries to walk a little    Dietary Restrictions, Food Intolerances:  Bread-feels like it gets stuck    Other GI Issues:  Nausea in the morning    Allergies:   Allergies   Allergen Reactions   ??? Penicillins      Other reaction(s): UNKNOWN   ??? 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    Behavioral Risk Factors:  Sleep- N/A  Fast Eating  N/A  Overeating  N/A    Hunger and Satiety Issues:  Appetite: Poor    Patient Questions:  None    Estimated Needs:  Estimated Energy Needs: 1800-2100 kcal/day    Estimated Protein Needs: 65-75 gm/day    Materials Provided To Meet their identified goals:  List of recommendations    Food labels discussed: Yes    Expected Compliance/Barriers are:   Comprehension of plan fair  Readiness for change fair  Ability to meet goals fair    We assessed family/social/cultural characteristics and these were relevant to care: none evident    Patient has auditory or visual communication barrier or need: no    Interventions Codes:  General/healthful diet     Follow-up (ARRANGE):  4 weeks      Length of visit was 22 minuntes. Patient understands diagnosis and treatment plan. Patient voices understanding. Plan is consistent with the patient???s preferences and goals.    This visit is conducted via telephone conferencing.    Contact Information  Person Contacted: Patient  Contact Phone number: [phone] (home) [phone] (work)  Is there someone else in the room? No.     The patient was physically located in [place] or a state in which I am permitted to provide care. The patient and/or parent/gauardian understood that s/he may incur co-pays and cost sharing, and agreed to the telemedicine visit. The visit was completed via phone and/or video, which was appropriate and reasonable under the circumstances given the patient's presentation at the time.    The patient and/or parent/guardian has been advised of the potential risks and limitations of this mode of treatment (including, but not limited to, the absence of in-person examination) and has agreed to be treated using telemedicine. The patient's/patient's family's questions regarding telemedicine have been answered.     If the phone/video visit was completed in an ambulatory setting, the patient and/or parent/guardian has also been advised to contact their provider???s office for worsening conditions, and seek emergency medical treatment and/or call 911 if the patient deems either necessary.

## 2019-06-02 MED ORDER — OMEPRAZOLE 20 MG CAPSULE,DELAYED RELEASE
ORAL_CAPSULE | 3 refills | 0 days | Status: SS
Start: 2019-06-02 — End: 2019-06-23

## 2019-06-04 ENCOUNTER — Ambulatory Visit
Admit: 2019-06-04 | Discharge: 2019-06-06 | Disposition: A | Discharge status: Home / Self Care | Payer: MEDICAID | Admitting: Internal Medicine

## 2019-06-04 DIAGNOSIS — R0789 Other chest pain: Principal | ICD-10-CM

## 2019-06-04 LAB — COMPREHENSIVE METABOLIC PANEL
ALBUMIN: 3.7 g/dL (ref 3.5–5.0)
ALKALINE PHOSPHATASE: 313 U/L — ABNORMAL HIGH (ref 38–126)
ALT (SGPT): 31 U/L (ref <50)
ANION GAP: 12 mmol/L (ref 7–15)
AST (SGOT): 113 U/L — ABNORMAL HIGH (ref 19–55)
BILIRUBIN TOTAL: 0.3 mg/dL (ref 0.0–1.2)
BLOOD UREA NITROGEN: 11 mg/dL (ref 7–21)
CALCIUM: 7.7 mg/dL — ABNORMAL LOW (ref 8.5–10.2)
CO2: 20 mmol/L — ABNORMAL LOW (ref 22.0–30.0)
CREATININE: 0.67 mg/dL — ABNORMAL LOW (ref 0.70–1.30)
EGFR CKD-EPI AA MALE: >90 mL/min/{1.73_m2} (ref >=60)
EGFR CKD-EPI NON-AA MALE: >90 mL/min/{1.73_m2} (ref >=60)
GLUCOSE RANDOM: 100 mg/dL (ref 70–179)
POTASSIUM: 3.7 mmol/L (ref 3.5–5.0)
PROTEIN TOTAL: 6.7 g/dL (ref 6.5–8.3)
SODIUM: 143 mmol/L (ref 135–145)

## 2019-06-04 LAB — CBC W/ AUTO DIFF
BASOPHILS ABSOLUTE COUNT: 0.1 10*9/L (ref 0.0–0.1)
BASOPHILS RELATIVE PERCENT: 1 %
EOSINOPHILS ABSOLUTE COUNT: 0 10*9/L (ref 0.0–0.4)
EOSINOPHILS RELATIVE PERCENT: 0.8 %
HEMATOCRIT: 32.4 % — ABNORMAL LOW (ref 41.0–53.0)
HEMOGLOBIN: 11.2 g/dL — ABNORMAL LOW (ref 13.5–17.5)
LARGE UNSTAINED CELLS: 4 % (ref 0–4)
LYMPHOCYTES ABSOLUTE COUNT: 2 10*9/L (ref 1.5–5.0)
LYMPHOCYTES RELATIVE PERCENT: 37 %
MEAN CORPUSCULAR HEMOGLOBIN CONC: 34.5 g/dL (ref 31.0–37.0)
MEAN CORPUSCULAR HEMOGLOBIN: 33.6 pg (ref 26.0–34.0)
MEAN CORPUSCULAR VOLUME: 97.3 fL (ref 80.0–100.0)
MEAN PLATELET VOLUME: 7.4 fL (ref 7.0–10.0)
NEUTROPHILS ABSOLUTE COUNT: 2.6 10*9/L (ref 2.0–7.5)
PLATELET COUNT: 212 10*9/L (ref 150–440)
RED BLOOD CELL COUNT: 3.33 10*12/L — ABNORMAL LOW (ref 4.50–5.90)
RED CELL DISTRIBUTION WIDTH: 14 % (ref 12.0–15.0)
WBC ADJUSTED: 5.3 10*9/L (ref 4.5–11.0)

## 2019-06-04 LAB — TOXICOLOGY SCREEN, URINE
BARBITURATE SCREEN URINE: <200
BENZODIAZEPINE SCREEN, URINE: <200
OPIATE SCREEN URINE: <300

## 2019-06-04 LAB — ETHANOL
ETHANOL: 253 mg/dL (ref 0.0–10.0)
Ethanol:MCnc:Pt:Ser/Plas:Qn:GC: 203
Ethanol:MCnc:Pt:Ser/Plas:Qn:GC: 253

## 2019-06-04 LAB — OPIATE SCREEN URINE: Lab: <300

## 2019-06-04 LAB — TROPONIN I
Troponin I.cardiac:MCnc:Pt:Ser/Plas:Qn:: <0.06
Troponin I.cardiac:MCnc:Pt:Ser/Plas:Qn:: <0.06

## 2019-06-04 LAB — MAGNESIUM: Magnesium:MCnc:Pt:Ser/Plas:Qn:: 1.7

## 2019-06-04 LAB — SODIUM: Sodium:SCnc:Pt:Ser/Plas:Qn:: 143

## 2019-06-04 LAB — APTT
Coagulation surface induced:Time:Pt:PPP:Qn:Coag: 36.5
HEPARIN CORRELATION: 0.2

## 2019-06-04 LAB — LIPASE: Triacylglycerol lipase:CCnc:Pt:Ser/Plas:Qn:: 158

## 2019-06-04 LAB — NEUTROPHILS ABSOLUTE COUNT: Lab: 2.6

## 2019-06-04 LAB — PROTIME-INR: PROTIME: 12.5 s (ref 10.2–13.1)

## 2019-06-04 LAB — INR: Lab: 1.09

## 2019-06-04 NOTE — Unmapped (Signed)
[facility]  Emergency Department Provider Note        ED Clinical Impression      Final diagnoses:   Atypical chest pain (Primary)   Alcohol related seizure (CMS-HCC)         Impression, ED Course, Assessment and Plan      Impression: 58 y.o. 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     On exam, patient is uncomfortable appearing and tearful but in no acute distress. Triage VS are WNL. Dry MM. Epigastric tenderness and guarding. Exam otherwise unremarkable.     Plan for basic labs, coags and troponin. Will obtain EKG and CXR.     Labs Reviewed   COMPREHENSIVE METABOLIC PANEL - Abnormal; Notable for the following components:       Result Value    Chloride 111 (*)     CO2 20.0 (*)     Creatinine 0.67 (*)     Calcium 7.7 (*)     AST 113 (*)     Alkaline Phosphatase 313 (*)     All other components within normal limits   ETHANOL - Abnormal; Notable for the following components:    Alcohol, Ethyl 253.0 (*)     All other components within normal limits    Narrative:     Disregard previous value due to improper specimen. Ethanol cannot be ran on gold SST. Needs Royal Blue top.    ETHANOL - Abnormal; Notable for the following components:    Alcohol, Ethyl 203.0 (*)     All other components within normal limits   TOXICOLOGY SCREEN, URINE - Abnormal; Notable for the following components:    Cannabinoid Scrn, Ur =/>20 ng/mL (*)     Cocaine(Metab.)Screen, Urine =/>150 ng/mL (*)     All other components within normal limits    Narrative:     The positive screening test(s)detected drugs belonging to the indicated class or another similar substance. Confirmation of positive screening results is available on request. These results should be used only for medical (i.e., treatment) purposes.   CK TOTAL AND CKMB - Abnormal; Notable for the following components:    Creatine Kinase, Total 543.0 (*)     All other components within normal limits   CBC W/ AUTO DIFF - Abnormal; Notable for the following components:    RBC 3.33 (*)     HGB 11.2 (*)     HCT 32.4 (*)     All other components within normal limits    Narrative:     Please use the Absolute Differential for reference ranges.    COVID-19 PCR - Normal    Narrative:     --                  This test was performed using the Cepheid Xpert Xpress SARS-CoV-2 assay which has been validated by the CLIA-certified, CAP-inspected [facility] and [facility]. FDA has granted Emergency Use Authorization for this test.                   This real-time RT-PCR test detects SARS-CoV-2 by targeting the N2 and E genes. Negative results do not preclude SARS-CoV-2 infection and should not be used as the sole basis for patient  management decisions. Negative results must be combined with clinical observations, patient history, and epidemiological information.                   Information for providers and patients can be found here: [email or web address]   TROPONIN I - Normal   MAGNESIUM - Normal   LIPASE - Normal   TROPONIN I - Normal   CBC W/ DIFFERENTIAL    Narrative:     The following orders were created for panel order CBC w/ Differential.                  Procedure                               Abnormality         Status                                     ---------                               -----------         ------                                     CBC w/ [id number]         Abnormal            Final result                                                 Please view results for these tests on the individual orders.   PROTIME-INR   APTT         Joseph Phillips screened positive for moderate suicide risk by ED nursing staff using the C-SSRS. I have personally interviewed the patient. It is my clinical judgment that his suicide risk level is low risk at this time. This will be reassessed if there is a clinically significant change in the psychiatric status of the patient. This judgment is based on my evaluation of the patient and our ability to directly address suicide risk, implement suicide prevention strategies and develop a safety plan as indicated while the patient is in their current clinical setting.    Protective Factors: lack of active SI/HI and no know access to weapons or firearms  Warning Signs/Risk Indicators: hopelessness    Dairl Ponder, MD  June 04, 2019 1:08 PM    2:30 PM  Patient is presenting with chest pain and alcohol-related seizure.    Patient alcohol level in 200s and he has had two seizures in past two days. He tried to stop drinking and is still interested in this. CIWA score is 9 despite high alcohol level. Will admit for detox and management of complicated withdrawal.    Chest pain is constant without features suggestive of ACS, PE, or other critical diagnosis. EKG does not show ischemia and troponin is negative. Patient is to be admitted so can continue cardiac rule out. Patient denies pain at present.     Additional Medical Decision Making  I have reviewed the vital signs and the nursing notes. Labs and radiology results that were available during my care of the patient were independently reviewed by me and considered in my medical decision making.     I directly visualized and independently interpreted the EKG tracing.   I independently visualized the radiology images.   I reviewed the patient's prior medical records (ED).     Portions of this record have been created using Scientist, clinical (histocompatibility and immunogenetics). Dictation errors have been sought, but may not have been identified and corrected.  ____________________________________________         History        Chief Complaint  Chest Pain HPI   Joseph Phillips is a 58 y.o. 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 Patient reports he was watering flowers yesterday and had acute onset of sharp, stabbing left sided chest pain that has been constant since. He states the pain intermittently travels from the left side of his chest to the right. Additionally, patient reports he has a history of alcohol withdrawal seizures. He recently was told to decrease the amount he was drinking to about 1 beer per day. The patient decided to quit drinking entirely 2 days ago and had seizure that night. The following day, he decided to drink and had another seizure yesterday morning. The patient states his daughter no longer wants to be in contact with him, which caused him to start drinking more again. Additionally, patient reports nausea and vomiting, with 3 episodes of non-bloody bile like emesis yesterday. He has been drinking a few beers per day prior to and following onset of initial seizure. Denies cough, fevers, chills, or any other medical concerns at this time.     Past Medical History:   Diagnosis Date   ??? Alcohol abuse    ??? COPD (chronic obstructive pulmonary disease) (CMS-HCC)    ??? Hx of radiation therapy    ??? Hypertension    ??? Polysubstance abuse (CMS-HCC)    ??? Prostate cancer (CMS-HCC)    ??? Seizures (CMS-HCC)        Patient Active Problem List   Diagnosis   ??? 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 Acute pancreatitis   ??? Depressive disorder   ??? Reflux esophagitis   ??? Encounter for long-term (current) use of other medications   ??? Pain medication agreement broken   ??? Do not give narcotics   ??? PPD screening test   ??? Tobacco use disorder   ??? Prostate cancer (CMS-HCC)   ??? Hx of radiation therapy   ??? Healthcare maintenance   ??? Sleep difficulties   ??? Essential hypertension   ??? Anxiety   ??? Hypokalemia ??? Hypomagnesemia   ??? Herpes zoster involving sacral dermatome   ??? Stomatitis and mucositis   ??? Odynophagia   ??? Mucositis due to chemotherapy   ??? Chest pain, mid sternal   ??? Acute midline thoracic back pain   ??? Hyperkalemia   ??? COPD (chronic obstructive pulmonary disease) (CMS-HCC)   ??? Chronic pancreatitis (CMS-HCC)   ??? 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 Cough   ??? Fever and chills       Past Surgical History:   Procedure Laterality Date   ??? IR INSERT PORT AGE GREATER THAN 5 YRS  07/15/2018    IR INSERT  PORT AGE GREATER THAN 5 YRS 07/15/2018 Carolin Coy, MD IMG [facility]   ??? PR COLSC FLX W/RMVL OF TUMOR POLYP LESION SNARE TQ N/A 08/16/2015    Procedure: COLONOSCOPY FLEX; W/REMOV TUMOR/LES BY SNARE;  Surgeon: Gwen Pounds, MD;  Location: [facility] PROC[facility];  Service: Gastroenterology   ??? PR COLSC FLX WITH DIRECTED SUBMUCOSAL NJX ANY SBST N/A 08/16/2015    Procedure: COLONOSCOPY, FLEXIBLE, PROXIMAL TO SPLENIC FLEXURE; WITH DIRECTED SUBMUCOSAL INJECTION(S), ANY SUBSTANCE;  Surgeon: Gwen Pounds, MD;  Location: [facility];  Service: Gastroenterology   ??? PROSTATE SURGERY           Current Facility-Administered Medications:   ???  acetaminophen (TYLENOL) tablet 650 mg, 650 mg, Oral, Q4H PRN, Salena Saner Guidici, MD  ???  albuterol 2.5 mg /3 mL (0.083 %) nebulizer solution 2.5 mg, 2.5 mg, Nebulization, Q4H PRN, Stefani Dama, MD  ???  amLODIPine (NORVASC) tablet 10 mg, 10 mg, Oral, Daily, Salena Saner Guidici, MD, 10 mg at 06/05/19 0830  ???  arformoterol (BROVANA) nebulizer solution 15 mcg/2 mL, 15 mcg, Nebulization, BID (RT), Stefani Dama, MD, 15 mcg at 06/05/19 0818  ???  aspirin chewable tablet 81 mg, 81 mg, Oral, Daily, Salena Saner Guidici, MD, 81 mg at 06/05/19 0830  ???  busPIRone (BUSPAR) tablet 5 mg, 5 mg, Oral, TID, Stefani Dama, MD, 5 mg at 06/05/19 1347  ???  calcium carbonate (TUMS) chewable tablet 400 mg of elem calcium, 400 mg of elem calcium, Oral, Daily PRN, Stefani Dama, MD  ???  chlordiazePOXIDE (LIBRIUM) capsule 25 mg, 25 mg, Oral, 4x Daily, Salena Saner Guidici, MD, 25 mg at 06/05/19 1209  ???  diclofenac sodium (VOLTAREN) 1 % gel 2 g, 2 g, Topical, 4x Daily, Salena Saner Guidici, MD, 2 g at 06/05/19 1211  ???  fentaNYL (DURAGESIC) 25 mcg/hr 1 patch, 1 patch, Transdermal, Q72H, Stefani Dama, MD, 1 patch at 06/04/19 2152  ???  folic acid 1 mg in sodium chloride (NS) 0.9 % 50 mL IVPB, 1 mg, Intravenous, Daily, Stefani Dama, MD, Last Rate: 114 mL/hr at 06/05/19 0825, 1 mg at 06/05/19 0825  ???  ipratropium-albuteroL (DUO-NEB) 0.5-2.5 mg/3 mL nebulizer solution 3 mL, 3 mL, Nebulization, Q4H (RT), Stefani Dama, MD, 3 mL at 06/05/19 1223  ???  LORazepam (ATIVAN) injection 2 mg, 2 mg, Intravenous, Q4H PRN, 2 mg at 06/05/19 1209 **OR** LORazepam (ATIVAN) injection 4 mg, 4 mg, Intravenous, Q4H PRN **OR** LORazepam (ATIVAN) injection 4 mg, 4 mg, Intravenous, Q30 Min PRN **OR** LORazepam (ATIVAN) injection 4 mg, 4 mg, Intravenous, Q2H PRN, Salena Saner Guidici, MD  ???  magnesium oxide (MAG-OX) tablet 400 mg, 400 mg, Oral, BID, Salena Saner Guidici, MD, 400 mg at 06/05/19 0830  ???  melatonin tablet 3 mg, 3 mg, Oral, Nightly PRN, Stefani Dama, MD, 3 mg at 06/04/19 2152  ???  ondansetron (ZOFRAN-ODT) disintegrating tablet 8 mg, 8 mg, Oral, Q8H PRN **OR** ondansetron (ZOFRAN) injection 4 mg, 4 mg, Intravenous, Q8H PRN, Salena Saner Guidici, MD  ???  oxyCODONE (ROXICODONE) immediate release tablet 5 mg, 5 mg, Oral, Q4H PRN **OR** oxyCODONE (ROXICODONE) immediate release tablet 10 mg, 10 mg, Oral, Q4H PRN, Salena Saner Guidici, MD  ???  pancrelipase (Lip-Prot-Amyl) (CREON) 24,000-76,000 -120,000 unit delayed release capsule 24,000 units of lipase, 1 capsule, Oral, With snacks, Salena Saner Guidici, MD  ???  pancrelipase (Lip-Prot-Amyl) (CREON) 24,000-76,000 -120,000 unit delayed release capsule 48,000 units  of lipase, 48,000 units of lipase, Oral, 3xd Meals, Salena Saner Guidici, MD, 48,000 units of lipase at 06/05/19 1212  ???  pantoprazole (PROTONIX) EC tablet 40 mg, 40 mg, Oral, BID, Salena Saner Guidici, MD, 40 mg at 06/05/19 0830  ???  polyethylene glycol (MIRALAX) packet 17 g, 17 g, Oral, Daily, Salena Saner Guidici, MD  ???  senna (SENOKOT) tablet 2 tablet, 2 tablet, Oral, Nightly PRN, Stefani Dama, MD  ???  thiamine (B-1) 500 mg in sodium chloride (NS) 0.9 % 100 mL IVPB, 500 mg, Intravenous, Q8H SCH, Salena Saner Guidici, MD, Last Rate: 230 mL/hr at 06/05/19 1347, 500 mg at 06/05/19 1347  ???  traZODone (DESYREL) tablet 50 mg, 50 mg, Oral, Nightly PRN, Salena Saner Guidici, MD    Allergies  Penicillins and Tramadol    Family History   Problem Relation Age of Onset   ??? Heart disease Mother    ??? Diabetes Mother    ??? Diabetes Father    ??? Melanoma Neg Hx    ??? Basal cell carcinoma Neg Hx    ??? Squamous cell carcinoma Neg Hx        Social History  Social History     Tobacco Use   ??? Smoking status: Current Some Day Smoker     Packs/day: 0.20     Years: 21.00     Pack years: 4.20     Types: Cigarettes     Start date: 05/09/1994   ??? Smokeless tobacco: Never Used   ??? Tobacco comment: smoking anywhere from 0-3 cigarettes daily   Substance Use Topics   ??? Alcohol use: Yes     Alcohol/week: 7.0 standard drinks     Types: 7 Cans of beer per week   ??? Drug use: Yes     Types: Marijuana       Review of Systems  Constitutional: Negative for fever.  Eyes: Negative for visual changes.  ENT: Negative for sore throat.  Cardiovascular: Negative for chest pain.  Respiratory: Negative for shortness of breath.  Gastrointestinal: Positive for abdominal pain and vomiting. Negative for diarrhea.  Genitourinary: Negative for dysuria.   Musculoskeletal: Negative for back pain.  Skin: Negative for rash.  Neurological: Negative for headaches, focal weakness or numbness.     Physical Exam     This provider entered the patient's room: Yes:    ? If this provider did not enter the room, a comprehensive physical exam was not able to be performed due to increased infection risk to themselves, other providers, staff and other patients), as well as to conserve personal protective equipment (PPE) utilization during the COVID-19 pandemic.    ? If this provider did enter the patient room, the following was PPE worn: Surgical mask, eye protection and gloves    ED Triage Vitals   Enc Vitals Group      BP 06/04/19 1220 124/69      Heart Rate 06/04/19 1220 96      SpO2 Pulse --       Resp 06/04/19 1220 22      Temp 06/04/19 1220 36.7 ??C (98 ??F)      Temp src --       SpO2 06/04/19 1220 99 %      Weight 06/04/19 1214 76.2 kg (168 lb)      Height 06/04/19 1214 1.727 m (5' 8)     Constitutional: Alert and oriented. Well appearing and in no distress.  Eyes: Conjunctivae are normal.  ENT       Head: Normocephalic and atraumatic.       Nose: No congestion.       Mouth/Throat: Mucous membranes are dry.        Neck: No stridor.  Hematological/Lymphatic/Immunilogical: No cervical lymphadenopathy.  Cardiovascular: Normal rate, regular rhythm. Normal and symmetric distal pulses are present in all extremities.  Respiratory: Normal respiratory effort. Breath sounds are normal.  Gastrointestinal: Epigastric tenderness and guarding. Soft. There is no CVA tenderness.  Musculoskeletal: Normal range of motion in all extremities.       Right lower leg: No tenderness or edema.       Left lower leg: No tenderness or edema.  Neurologic: Normal speech and language. No gross focal neurologic deficits are appreciated.  Skin: Skin is warm, dry and intact. No rash noted.  Psychiatric: Mood and affect are normal. Speech and behavior are normal.     EKG     NSR at 93, normal axis, normal intervals, no acute ischemia.     Radiology     XR Chest Portable   Final Result          Documentation assistance was provided by Adam Phenix, Scribe, on June 04, 2019 at 1:18 PM for Kevin Fenton, MD. June 05, 2019 3:28 PM. Documentation assistance provided by the above mentioned scribe. I was present during the time the encounter was recorded. The information recorded by the scribe was done at my direction and has been reviewed and validated by me.       Jearld Fenton, MD  06/05/19 1528

## 2019-06-04 NOTE — Unmapped (Signed)
ED Progress Note    4:51 PM  Spoken with the hospitalist will be coming down to evaluate and admit [name] for detox/complicated withdrawal.

## 2019-06-04 NOTE — Unmapped (Signed)
Pt to triage via wc c/o mid-L sided CP associated with SOB since yesterday associated w/ vomiting.    Pt also states he had a seizure the past two days (last seizure was years ago). Not on seizure meds     Drinks 2-3 beers/day; last drink was yest.

## 2019-06-05 LAB — CREATINE KINASE-MB: Creatine kinase.MB:MCnc:Pt:Ser/Plas:Qn:: 3.58

## 2019-06-05 LAB — CK TOTAL AND CKMB: CREATINE KINASE-MB: 3.58 ng/mL (ref 0.00–6.00)

## 2019-06-05 NOTE — Unmapped (Addendum)
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    EtOH withdrawal with seizure: currently experiencing significant withdrawal despite still having lab evidence of intoxication. I suspect that he is drinking significantly more than reported based on his symptoms. Endorses 2 seizures prior to presentation and has a history of withdrawal seizures. When asked about if he is ready to get sober, he initially appears relatively unsure and asks how long will I have to be in the hospital? After this he states that he doesn't want to keep feeling like this and wants help. Has been sober for prolonged period in the past.  - CIWA with symptom triggered ativan  - start librium taper with 25 mg 4x daily  - continue home buspar TID  - watch oversedation with home pain medications as below  - start IV thiamine and folate  - seizure precautions  - SW consult  - check U tox    Cough, SOB, fever, chills, vomiting: denies sick contacts, though was recently seen in oncology clinic (6/4). No recent travel. Does not work. CBC wnl, CXR without airspace disease noted. Differential COPD exacerbation vs infection. Currently no wheezing on exam. Given coughing, this may be exacerbating his known malignant pain (has mets to sternum, scapula, spine) and causing chest pain- patient reports pain reproducible when I press on his chest.  - check COVID-19 screen  - duonebs q4hr sch, albuterol q4hr prn  - hold on prednisone for now, but if higher concern for COPD flare develops, can initiate  - zofran prn  - increase home PPI to BID  - tylenol prn fever    Chest pain: HEART score of 3. Denies family history of MI, CVD. Being admitted for other concerns and thus will continue chest pain rule out. Trop negative. EKG NSR on admission. May be secondary to known metastatic pain (mets to sternum, L scapula, spine- and now likely humerus per CXR)  - check troponin at 6 hours, check CK/CK-MB  - daily EKG  - tele x 24 hours  - ASA daily  - check lipids, Hgb A1c  - If chest pain returns, repeat Troponin I and ECG  - If second or subsequent troponins are >0.10 or patient develops dynamic ECG changes, will start Heparin gtt  - needs tobacco cessation counseling- not done at admission 2/2 agitation  - pain control as below    COPD:   - continue home inhalers, with exception of spiriva (as currently on sch duonebs as above)    Prostate Cancer w/ known bony mets: followed by Dr Claude Manges. Worsening metastatic disease (see CXR) with worsening PSA level on last labs 6/4.  - holding home Xtandi while inpatient  - home valcyte (had shingles episode ~6 months ago)    HTN: continue home amlodipine    Chronic/Cancer-related Pain: Of note, previously dismissed from [facility] twice for breaches of pain contract in the  setting of polysubstance abuse. Currently receiving oxy 10 mg q4hr prn as well as fentanyl patch 25 mcg/hr through Dr Claude Manges for cancer related pain (has known bony mets).   - continue home fentanyl patch  - continue oxy 5-10 prn (home dose)  - watch for oversedation while treating EtOH withdrawal symptoms  - trazodone nightly prn  - voltaren gel QID    Chronic pancreatitis: Tender on exam, but lipase not elevated  - continue home creon with meals and snacks  - low fat diet for now    Polysubstance abuse: ongoing tobacco use (1/4 ppd) and marijuana use. Denies cocaine or other drug use.  - SW consult  - tox screen as above  - needs tobacco counseling prior to d/c    FEN/GI:  - low fat diet, heart healthy  - zofran prn  - increase home PPI from daily to BID given abdominal pain and likely alcoholic gastritis    Code Status:  Full Code - presumed, patient does not seem able to have this conversation currently. Patient's primary contacts are his brother [name], [phone]) and his mother Juluis Rainier, [phone]).  ___________________________________________________________________    Chief Complaint  Chief Complaint   Patient presents with   ??? Chest Pain       HPI:  Joseph Phillips is a 58 y.o. male with PMHx as noted below that presents to 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    History is limited due to patient's agitated state. Currently with tremors and unable to sit still comfortably    Mr Esmeralda Arthur presented to the ED today with main complaint of chest pain. He states that the pain is midsternal and radiates to the R side of his chest. He feels like something is sitting on his chest and states that lifting things makes it worse. He states that it does sometimes feel better, but is unable to tell me what. He says that it started all of a sudden 2 days ago. Per ED note, he told initial assessing physician that it started while he was watering plants. He was concerned about it and thus presented today. He also has 2-3 days of worsening cough, SOB. Cough is occasionally productive, but he is unable to describe this further. He has had subjective fever and chills, which he is currently experiencing.     He also states that he would like to detox here in the hospital from alcohol. He states he has been becoming increasingly more dependent, needing to drink all day long. He states he wakes up with stomach pain and has vomiting until he can get the pain under control with drinking in the afternoon. He is drinking at least 4-5 16 ounce beers daily. He states that he usually doesn't drink liquor, but he had some swallows of his friend's liquor the other day. He has a remote history of withdrawal seizures, but had not had any since he started drinking again (Previously had been sober for up to 10 years. He states that he relapsed 1-2 years ago). He states that he has had 2 withdrawal seizures over the past couple days. They were witnessed by his mother. He denies tongue biting or incontinence during these episodes. He is not sure how long they lasted. He does use marijuana, but denies other drug use (has a history of cocaine use). He states that he has been to a treatment center in [place] 2-3 times for a week at a time. Can't tell me when the  last time was. He is interested in stopping.     In the ED, he was noted to be tachycardic, but otherwise afebrile and relatively normal BP. CIWA score was initially 9. He did not receive any ativan with that measurement and had not received any treatment for withdrawal prior to my assessment. EtOH level remains elevated in the ED. EKG showed NSR, trop negative. CXR without acute airspace disease, but did show known bony lesion in the scapula, possibly new bony lesion in his humerus.    Allergies:  Penicillins and Tramadol    Medications:   Prior to Admission medications    Medication Dose, Route, Frequency   albuterol HFA 90 mcg/actuation inhaler 2 puffs, Inhalation, Every 6 hours PRN   amLODIPine (NORVASC) 10 MG tablet 10 mg, Oral, Daily (standard)   busPIRone (BUSPAR) 5 MG tablet TAKE 1 TABLET BY MOUTH THREE TIMES A DAY   diclofenac sodium (VOLTAREN) 1 % gel 2 g, Topical, 4 times a day, Chest, area of pain   diphenhydrAMINE (BENADRYL) 25 mg tablet 12.5 mg, Oral, Every 6 hours PRN   diphenhydramine HCl (MAGIC MOUTHWASH ORAL) suspension 10 mL, Oral, 4 times daily PRN   enzalutamide (XTANDI) 40 mg capsule 120 mg, Oral, Daily (standard)   fentaNYL (DURAGESIC) 25 mcg/hr patch 1 patch, Transdermal, Every 72 hours   magnesium oxide (MAG-OX) 400 mg (241.3 mg magnesium) tablet 1 tablet, Oral, 2 times a day (standard)   omeprazole (PRILOSEC) 20 MG capsule TAKE 2 CAPSULES BY MOUTH ONCE DAILY   oxyCODONE (ROXICODONE) 5 MG immediate release tablet 10 mg, Oral, Every 4 hours PRN   pancrelipase, Lip-Prot-Amyl, (CREON) 24,000-76,000 -120,000 unit CpDR delayed release capsule 48,000 units of lipase, Oral, 3 times a day (with meals), Take an additional 1 capsule with snacks.    prochlorperazine (COMPAZINE) 10 MG tablet 10 mg, Oral, Every 8 hours PRN   salmeteroL (SEREVENT) 50 mcg/dose diskus inhaler 1 puff, Inhalation, 2 times a day (standard)   tiotropium (SPIRIVA WITH HANDIHALER) 18 mcg inhalation capsule 18 mcg, Inhalation, Daily (RT)   traZODone (DESYREL) 50 MG tablet 50 mg, Oral, Nightly PRN   valACYclovir (VALTREX) 500 MG tablet 500 mg, Oral, Daily (standard)       Medical History:  Past Medical History:   Diagnosis Date   ??? Alcohol abuse    ??? COPD (chronic obstructive pulmonary disease) (CMS-HCC)    ??? Hx of radiation therapy    ??? Hypertension    ??? Polysubstance abuse (CMS-HCC)    ??? Prostate cancer (CMS-HCC)    ??? Seizures (CMS-HCC)        Surgical History:  Past Surgical History:   Procedure Laterality Date   ??? IR INSERT PORT AGE GREATER THAN 5 YRS  07/15/2018    IR INSERT PORT AGE GREATER THAN 5 YRS 07/15/2018 Carolin Coy, MD IMG [facility]   ??? PR COLSC FLX W/RMVL OF TUMOR POLYP LESION SNARE TQ N/A 08/16/2015    Procedure: COLONOSCOPY FLEX; W/REMOV TUMOR/LES BY SNARE;  Surgeon: Gwen Pounds, MD;  Location: [facility] PROC[facility];  Service: Gastroenterology   ??? PR COLSC FLX WITH DIRECTED SUBMUCOSAL NJX ANY SBST N/A 08/16/2015    Procedure: COLONOSCOPY, FLEXIBLE, PROXIMAL TO SPLENIC FLEXURE; WITH DIRECTED SUBMUCOSAL INJECTION(S), ANY SUBSTANCE;  Surgeon: Gwen Pounds, MD;  Location: [facility];  Service: Gastroenterology   ??? PROSTATE SURGERY         Social History:  Tobacco use:   reports that he has been  smoking cigarettes. He started smoking about 25 years ago. He has a 4.20 pack-year smoking history. He has never used smokeless tobacco.  Alcohol use:   reports current alcohol use of about 7.0 standard drinks of alcohol per week.  Drug use:  reports current drug use. Drug: Marijuana.  Living situation: the patient lives with his mother. States he collects SSI and does not work..    Family History:  Family History   Problem Relation Age of Onset   ??? Heart disease Mother    ??? Diabetes Mother    ??? Diabetes Father    ??? Melanoma Neg Hx    ??? Basal cell carcinoma Neg Hx    ??? Squamous cell carcinoma Neg Hx        Review of Systems:  10 systems reviewed and are negative unless otherwise mentioned in HPI      Physical Exam:  Temp:  [36.6 ??C (97.9 ??F)-36.7 ??C (98 ??F)] 36.6 ??C (97.9 ??F)  Heart Rate:  [96-102] 101  SpO2 Pulse:  [94-98] 98  Resp:  [12-23] 15  BP: (113-149)/(63-83) 149/81  SpO2:  [99 %] 99 %  Body mass index is 25.54 kg/m??.    GEN: constantly moving and changing position throughout interview, appears uncomfortable  EYES: EOMI, sclera non-icteric  ENT: normal appearing nose, MMM  CV: tachycardic, regular rhythm, no murmurs appreciated  Chest: TTP over sternum (patient states this is his chest pain)  PULM: coarse on the L side, no wheezes, normal WOB  ABD: soft, ND, TTP in the mid epigastrium without rebound or guarding, +BS  EXT: No edema, WWP  NEURO: No focal deficits, tremor of UE bilaterally  PSYCH: A+Ox3, endorses occasionally hearing voices and seeing things lately but not currently, normal speech content  MSK: see chest exam    Test Results:  Data Review:    All lab results last 24 hours:    Recent Results (from the past 24 hour(s))   ECG 12 lead (Adult)    Collection Time: 06/04/19 12:09 PM   Result Value Ref Range    EKG Systolic BP  mmHg    EKG Diastolic BP  mmHg    EKG Ventricular Rate 93 BPM    EKG Atrial Rate 93 BPM    EKG P-R Interval 150 ms    EKG QRS Duration 80 ms    EKG Q-T Interval 386 ms    EKG QTC Calculation 479 ms    EKG Calculated P Axis 59 degrees    EKG Calculated R Axis 39 degrees    EKG Calculated T Axis 53 degrees    QTC Fredericia 447 ms   Comprehensive Metabolic Panel    Collection Time: 06/04/19 12:54 PM   Result Value Ref Range    Sodium 143 135 - 145 mmol/L    Potassium 3.7 3.5 - 5.0 mmol/L    Chloride 111 (H) 98 - 107 mmol/L    Anion Gap 12 7 - 15 mmol/L    CO2 20.0 (L) 22.0 - 30.0 mmol/L    BUN 11 7 - 21 mg/dL    Creatinine 1.61 (L) 0.70 - 1.30 mg/dL    BUN/Creatinine Ratio 16     EGFR CKD-EPI Non-African American, Male >90 >=60 mL/min/1.54m2    EGFR CKD-EPI African American, Male >90 >=60 mL/min/1.34m2    Glucose 100 70 - 179 mg/dL    Calcium 7.7 (L) 8.5 - 10.2 mg/dL    Albumin 3.7 3.5 - 5.0 g/dL    Total  Protein 6.7 6.5 - 8.3 g/dL    Total Bilirubin 0.3 0.0 - 1.2 mg/dL    AST 161 (H) 19 - 55 U/L    ALT 31 <50 U/L    Alkaline Phosphatase 313 (H) 38 - 126 U/L   Troponin I    Collection Time: 06/04/19 12:54 PM   Result Value Ref Range    Troponin I <0.060 <0.060 ng/mL   PT-INR    Collection Time: 06/04/19 12:54 PM   Result Value Ref Range    PT 12.5 10.2 - 13.1 sec    INR 1.09    aPTT    Collection Time: 06/04/19 12:54 PM   Result Value Ref Range    APTT 36.5 25.9 - 39.5 sec    Heparin Correlation 0.2    CBC w/ Differential    Collection Time: 06/04/19 12:54 PM   Result Value Ref Range    WBC 5.3 4.5 - 11.0 10*9/L    RBC 3.33 (L) 4.50 - 5.90 10*12/L    HGB 11.2 (L) 13.5 - 17.5 g/dL    HCT 09.6 (L) 04.5 - 53.0 %    MCV 97.3 80.0 - 100.0 fL    MCH 33.6 26.0 - 34.0 pg    MCHC 34.5 31.0 - 37.0 g/dL    RDW 40.9 81.1 - 91.4 %    MPV 7.4 7.0 - 10.0 fL    Platelet 212 150 - 440 10*9/L    Neutrophils % 48.7 %    Lymphocytes % 37.0 %    Monocytes % 8.6 %    Eosinophils % 0.8 %    Basophils % 1.0 %    Absolute Neutrophils 2.6 2.0 - 7.5 10*9/L    Absolute Lymphocytes 2.0 1.5 - 5.0 10*9/L    Absolute Monocytes 0.5 0.2 - 0.8 10*9/L    Absolute Eosinophils 0.0 0.0 - 0.4 10*9/L    Absolute Basophils 0.1 0.0 - 0.1 10*9/L    Large Unstained Cells 4 0 - 4 %   Magnesium Level Collection Time: 06/04/19 12:54 PM   Result Value Ref Range    Magnesium 1.7 1.6 - 2.2 mg/dL   Ethanol    Collection Time: 06/04/19 12:54 PM   Result Value Ref Range    Alcohol, Ethyl 253.0 (HH) 0.0-<10.0 mg/dL   Lipase Level    Collection Time: 06/04/19 12:54 PM   Result Value Ref Range    Lipase 158 44 - 232 U/L   Ethanol    Collection Time: 06/04/19  2:19 PM   Result Value Ref Range    Alcohol, Ethyl 203.0 (HH) 0.0-<10.0 mg/dL     I have reviewed the labs and studies from the last 24 hours.    Imaging: Radiology studies were personally reviewed and Xr Chest Portable    Result Date: 06/04/2019  EXAM: XR CHEST PORTABLE DATE: 06/04/2019 1:15 PM ACCESSION: [id number] UN DICTATED: 06/04/2019 1:34 PM INTERPRETATION LOCATION: [facility] CLINICAL INDICATION: 58 years old Male with CHEST PAIN  COMPARISON: 03/10/2019 TECHNIQUE: Portable Chest Radiograph. CONCLUSIONS: Right chest wall port terminates at the level of SVC right atrium junction. Normal heart size. The lungs are well-inflated without consolidation or pulmonary edema. Focal sclerosis in the left humeral neck and scapula, concerning for metastases.       EKG: normal EKG, normal sinus rhythm, unchanged from previous tracings.

## 2019-06-05 NOTE — Unmapped (Signed)
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 *       1.  Chronic alcoholism with alcohol withdrawal seizure prior to admission.  Patient presented to ER wanting to be medically treated for alcohol withdrawal and subsequently obtain further treatment.  This morning, he is asking to go home and does not seem to understand his treatment will take longer,.  He agrees to stay for further treatment with CIWA protocol, benzodiazepines and further appropriate supportive care.    2.  Cocaine abuse.  Toxicology screen positive.  He appears to have multiple substance use disorder.  Talk screen also added for cannabinoids.    3.  History of essential hypertension.  Manage medically.    4.  History of COPD, clinically stable.    5.  Reflux esophagitis.  Continue PPI.    6.  COPD, not in exacerbation.  Continue current medication regimen.    6.  Tobacco use disorder.  Provide support for quitting.      *DVT prophylaxis: Is ambulatory.    *Disposition: Dissipate discharge home in 2 to 3 days, possibly longer.  He reports living with his mother.      Please page the [facility]) pager at [phone] with questions.      Subjective:   Denies any tremor, visual hallucination.  No nausea or vomiting.    Objective:       Vital signs in last 24 hours:  Temp:  [36.6 ??C (97.9 ??F)-37 ??C (98.6 ??F)] 36.7 ??C (98.1 ??F)  Heart Rate:  [94-113] 94  SpO2 Pulse:  [94-114] 95  Resp:  [12-24] 19  BP: (113-165)/(60-92) 119/60  MAP (mmHg):  [78-114] 82  SpO2:  [95 %-100 %] 99 %    Intake/Output last 24 hours:    Intake/Output Summary (Last 24 hours) at 06/05/2019 1307  Last data filed at 06/05/2019 1030  Gross per 24 hour   Intake 455 ml   Output 1400 ml   Net -945 ml     Remote telemetry monitoring shows normal sinus rhythm 2 sinus tachycardia at 104.    Physical Exam:    Gen: Frail, chronically ill-appearing, no acute distress.    HEENT: No icterus.    CV: Regular rate and rhythm.    PULM/chest: Clear to auscultation.    NFA:OZHY, Non tender, No palpable organomegaly    Ext: No C/C/E    Neuro: No acute focal deficits.    Skin: Warm, dry.      Medications:   Scheduled Meds:  ??? amLODIPine  10 mg Oral Daily   ??? arformoteroL  15 mcg Nebulization BID (RT)   ??? aspirin  81 mg Oral Daily   ??? busPIRone  5 mg Oral TID   ??? chlordiazePOXIDE  25 mg Oral 4x Daily   ??? diclofenac sodium  2 g Topical 4x Daily   ??? fentaNYL  1 patch Transdermal Q72H   ??? folic acid (FOLVITE) IVPB  1 mg Intravenous Daily   ??? ipratropium-albuteroL  3 mL Nebulization Q4H (RT)   ??? magnesium oxide  400 mg Oral BID   ??? pancrelipase (Lip-Prot-Amyl)  48,000 units of lipase Oral 3xd Meals   ??? pantoprazole  40 mg Oral BID   ??? polyethylene glycol  17 g Oral Daily   ??? thiamine  500 mg Intravenous [facility]     Laboratory studies since admission reviewed.    35 minutes, over 50% spent in counseling and coordination of care.  Tawni Levy MD

## 2019-06-05 NOTE — Unmapped (Signed)
SW reviewed chart prior to attempting to speak with pt who was sleeping and on bipap. Contacted pt's brother Jonna Munro, [phone], [phone]), who is listed as pt's preferred contact, to introduce self and role. Brother reported that the pt lives with mother Juluis Rainier) and another brother (Hurdis Esmeralda Arthur) in a one story home, located at [address], [place], [place]. Brother mentioned that the pt has one dtr but stated that the pt has minimal contact with his dtr and that the relationship is strained. Per brother, the pt is independent at baseline. 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 Brother denied any other deficits for the pt but noted that he struggles with his food intake. He explained that the pt has no taste for things due to the effects of his chemo. Brother noted that he takes the pt to most of his appointments and is familiar with pt's overall health. He mentioned that he lives next door to the pt and reported that he is able to assist as needed.    Social Work  Psychosocial Assessment    Patient Name: Wilburn Mylar   Medical Record Number: [id number]   Date of Birth: 10-May-1961  Sex: Male     Referral  Referred by: Care Manager  Reason for Referral: Substance Abuse Issues, Other - specify in comments  Comment: Consult ordered based on pt's h/o etoh abuse. Pt admitted with BAC of 253. Reviewed notes which indicated that the pt informed the team that he drinks approx 4-5, 16oz beers each day. Notes also reported that the pt had a 10y history of sobriety but that he relapsed approx 1-2y ago. SW spoke with pt's brother who reported that he was aware of pt's etoh abuse; however, he mentioned that the pt does not drink in front of family so no one knows the extent to which he was drinking. He explainied that their mother found bottles of wine and beer in pt's room after he was admitted to the hospital. Brother voiced uncertainty as to h/o SA treatment but stated that he thinks the pt went through a treatment program in [place]; however, he voiced uncertainty as to when he would have gone through the program and stated he was unsure as to whether the pt was able to maintain sobriety after leaving the program. Brother explained that the pt had been close with one of their brothers and mentioned that the pt and this other brother were living together; however, [name] explained that the brother passed away Feb 18, 2023. He reported that the pt moved back in with his mother Rico Ala) following the passing of his brother. Brother mentioned that he spoke with the pt yesterday and that the pt was trembling, adding that the pt informed him that he needed a drink in order to make the trembling stop. Brother noted that he had never seen the pt in this state    Extended Emergency Contact Information  Primary Emergency Contact: Lint,Curtis   [place]  Home Phone: [phone]  Mobile Phone: [phone]  Relation: Brother    Legal Next of Kin / Guardian / POA / Advance Directives      Advance Directive (Medical Treatment)  Reason patient does not have an advance directive covering medical treatment:: Patient does not wish to complete one at this time.(SW will address HCPOA with the pt and inquire as to whether he is interested in completing forms for [employer])  Healthcare Decision Maker:  HCDM documented in the HCDM/Contact Info section.    Health Care Decision Maker [HCDM] (Medical & Mental Health Treatment)  Healthcare Decision Maker: HCDM documented in the HCDM/Contact Info section.  Information offered on HCDM, Medical & Mental Health advance directives:: Other (Comment)(Information given to pt's brother as the pt was on bipap and sleeping at time of SW's attempt. Brother listed as preferred contact)    Advance Directive (Mental Health Treatment)  Does patient have an advance directive covering mental health treatment?: Patient does not have advance directive covering mental health treatment.    Discharge Planning  Discharge Planning Information:   Type of Residence   Mailing Address:  [address]  [place] [address]    Medical Information   Past Medical History:   Diagnosis Date   ??? Alcohol abuse    ??? COPD (chronic obstructive pulmonary disease) (CMS-HCC)    ??? Hx of radiation therapy    ??? Hypertension    ??? Polysubstance abuse (CMS-HCC)    ??? Prostate cancer (CMS-HCC)    ??? Seizures (CMS-HCC)        Past Surgical History:   Procedure Laterality Date   ??? IR INSERT PORT AGE GREATER THAN 5 YRS  07/15/2018    IR INSERT PORT AGE GREATER THAN 5 YRS 07/15/2018 Carolin Coy, MD IMG [facility]   ??? PR COLSC FLX W/RMVL OF TUMOR POLYP LESION SNARE TQ N/A 08/16/2015    Procedure: COLONOSCOPY FLEX; W/REMOV TUMOR/LES BY SNARE;  Surgeon: Gwen Pounds, MD;  [facility]: [facility];  Service: Gastroenterology   ??? PR COLSC FLX WITH DIRECTED SUBMUCOSAL NJX ANY SBST N/A 08/16/2015    Procedure: COLONOSCOPY, FLEXIBLE, PROXIMAL TO SPLENIC FLEXURE; WITH DIRECTED SUBMUCOSAL INJECTION(S), ANY SUBSTANCE;  Surgeon: Gwen Pounds, MD;  Location: [facility];  Service: Gastroenterology   ??? PROSTATE SURGERY         Family History   Problem Relation Age of Onset   ??? Heart disease Mother    ??? Diabetes Mother    ??? Diabetes Father    ??? Melanoma Neg Hx    ??? Basal cell carcinoma Neg Hx    ??? Squamous cell carcinoma Neg Hx        Financial Information   Primary Insurance: Payor: MEDICAID [place] / Plan: MEDICAID [place] ACCESS / Product Type: *No Product type* /    Secondary Insurance: None   Prescription Coverage: Medicaid   Preferred Pharmacy: [id number] - [place], [place] - [address] AT [facility]  [facility]    Barriers to taking medication: No    Transition Home   Transportation at time of discharge: Family/Friend's Private Vehicle    Anticipated changes related to Illness: none   Services in place prior to admission: N/A   Services anticipated for DC: Substance Abuse Services: Wil speak with the pt about SA treatment resources. He was admitted with BAC of 253. Secretive with his drinking   Hemodialysis Prior to Admission: No    Readmission  Risk of Unplanned Readmission Score: UNPLANNED READMISSION SCORE: 25%  Readmitted Within the Last 30 Days?   Readmission Factors include: other: Continued etoh abuse, cancer dx    Social Determinants of Health  Social Determinants of Health were addressed in provider documentation.  Please refer to patient history.    Social History  Support Systems: Family Members(Primary supports for pt are brother Jonna Munro, h: [phone], c: [phone]) and mother Rico Ala  Esmeralda Arthur, [phone]). Pt's cell: [phone])                          Military Service: No military service               Medical and Psychiatric History  Psychosocial Stressors: Coping with health challenges/recent hospitalization, Comment, Financial concerns   Comment: Pt's brother reported that the pt is not currently employed. 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 Brother noted that the pt currently receives disability and noted that he has been receiving disability for a while. He stated that he believed disability was related to pt's pancreatic cancer. SW reviewed notes from outpatient oncology SW: Sharlet Salina, MSW who noted that the pt was struggling with the cost of ensure and other necessary resources. Brother stated that the pt was able to get assistance with incontinence briefs through somewhere in [place]. He mentioned that the pt had needed assistance with the cost of Ensure but noted that he was unsure as to whether the pt had received any assistance with nutrition. SW reviewed notes from RD through the oncology team [facility], RD/LDN) from a discussion on 6/8 where it was noted that the pt wanted coupons for ensure to be sent to the home. Brother mentioned that the pt speaks about his cancer frequently. He stated that the pt has voiced frustration re: lack of apetite due to the lack of taste 2/2 chemo. Brother mentioned that the pt had undergone both radiation and chemo and that he had episodes of neuropathy leaving him with difficulties with ambulation. He stated that the pt has to use a cane, occasionally, for pain in his feet. Per brother, pt is independent with all other daily tasks  Psychological Issues/Information: No issues(No documented mental health condition. Pt with etoh dependence)              Chemical Dependency: Alcohol(Per report given to eD staff, the pt drinks approx 4-5, 16oz beers/day. SW unable to speak with the pt to verify this and brother unsure since pt drinks secretly)              Outpatient Providers: Primary Care Provider, Specialist   Name / Contact #: : PCP: Dr. Heide Scales; Onc: Dr. Claude Manges; Onc social worker: Sharlet Salina, MSW; Pop health: Ferol Luz, LCSW  Legal: No legal issues      Ability to [employer]: No issues accessing community services(Pt with some pampers through clinic in [place]. Enrolled in Cancer support program. Working with [employer] health leader and onc Child psychotherapist)      SW continuing to follow.    Chauncy Lean, MSW, LCSW, ACM/ Pager: [phone]/06/04/2020 1:53 PM

## 2019-06-05 NOTE — Unmapped (Signed)
Patient is currently asleep, resting comfortably, vital signs are stable and within defined limits and he is not requiring the use of any oxygen supplementation at this time. He has had no complaints of pain but does have a fentanyl patch on his right upper arm for chronic generalized pain. Skin is intact, requires contact guard assistance to use the bedside commode due to weakness. The patient is AAOx4, is drowsy but will awake and respond to all questions. He has visible tremors, CIWA is being scored Q4 hours. Patient did ask if he was going to be allowed to go home today.      Problem: Adult Inpatient Plan of Care  Goal: Absence of Hospital-Acquired Illness or Injury  Outcome: Progressing  Goal: Optimal Comfort and Wellbeing  Outcome: Progressing     Problem: Fall Injury Risk  Goal: Absence of Fall and Fall-Related Injury  Outcome: Progressing     Problem: Skin Injury Risk Increased  Goal: Skin Health and Integrity  Outcome: Progressing     Problem: COPD Comorbidity  Goal: Maintenance of COPD Symptom Control  Outcome: Progressing     Problem: Pain Chronic (Persistent) (Comorbidity Management)  Goal: Acceptable Pain Control and Functional Ability  Outcome: Progressing     Problem: Alcohol Withdrawal  Goal: Alcohol Withdrawal Symptom Control  Outcome: Ongoing - Unchanged

## 2019-06-05 NOTE — Unmapped (Signed)
Care Management  Initial Transition Planning Assessment    Indicators for social work consultation identified.  Indicators include: ETOH  Social worker consulted for psychosocial and discharge needs assessment.    CM will continue to follow for avoidable delays and opportunities for progression of care.   06/05/2019 9:32 AM                General  Care Manager assessed the patient by : Medical record review(Telephone conversation with pt. Pt consented for CM to do phone assessment instead of face to face.)  Orientation Level: Oriented X4  Who provides care at home?: N/A  Reason for referral: Discharge Planning   Type of Residence: Mailing Address:  [address]  [place] [address]  Contacts: Accompanied by: Alone   Extended Emergency Contact Information  Primary Emergency Contact: Kilmer,Curtis   [place]  Home Phone: [phone]  Mobile Phone: [phone]  Relation: Brother  Patient Phone Number: [phone] (home) [phone] (work)        Medical Provider(s): Max Jeri Cos, MD  Reason for Admission: Admitting Diagnosis:  No admission diagnoses are documented for this encounter.  Past Medical History:   has a past medical history of Alcohol abuse, COPD (chronic obstructive pulmonary disease) (CMS-HCC), radiation therapy, Hypertension, Polysubstance abuse (CMS-HCC), Prostate cancer (CMS-HCC), and Seizures (CMS-HCC).  Past Surgical History:   has a past surgical history that includes Prostate surgery; pr colsc flx w/rmvl of tumor polyp lesion snare tq (N/A, 08/16/2015); pr colsc flx with directed submucosal njx any sbst (N/A, 08/16/2015); and IR Insert Port Age Greater Than 5 Years (07/15/2018).   Previous admit date: 11/12/2018    Primary Insurance- Payor: MEDICAID [place] / Plan: MEDICAID [place] ACCESS / Product Type: *No Product type* /   Secondary Insurance ??? None  Prescription Coverage ??? same as above  Preferred Pharmacy - [id number] - [place], [place] - [address] AT [facility]  [facility]    Transportation home: Private vehicle  Level of function prior to admission: Independent    Contact/Decision Maker  Extended Emergency Contact Information  Primary Emergency Contact: Kent,Curtis   [place]  Home Phone: [phone]  Mobile Phone: [phone]  Relation: Brother    Legal Next of Kin / Guardian / POA / Advance Directives     Advance Directive (Medical Treatment)  Reason patient does not have an advance directive covering medical treatment:: Patient needs follow-up to complete one.  Healthcare Decision Maker: Patient does not wish to appoint a Health Care Decision Maker at this time    Health Care Decision Maker [HCDM] (Medical & Mental Health Treatment)  Healthcare Decision Maker: Patient does not wish to appoint a Health Care Decision Maker at this time  Information offered on HCDM, Medical & Mental Health advance directives:: Patient declined information.    Patient Information  Lives with: Family members    Type of Residence: Private residence     Location/Detail: [place], [place]    Support Systems: Family Members    Responsibilities/Dependents at home?: No    Home Care services in place prior to admission?: No     Equipment Currently Used at Home: (Pt has a cane at home but not using it)    Currently receiving outpatient dialysis?: No    Financial Information    Need for financial assistance?: No    Social Determinants of Health  Social Determinants of Health were addressed in provider documentation.  Please refer to patient history.    Discharge Needs Assessment  Concerns to be Addressed: care coordination/care conferences, substance/tobacco abuse/use    Clinical Risk Factors: New Diagnosis    Barriers to taking medications: No    Prior overnight hospital stay or ED visit in last 90 days: Yes    Readmission Within the Last 30 Days: no previous admission in last 30 days    Anticipated Changes Related to Illness: none Equipment Needed After Discharge: none    Discharge Facility/Level of Care Needs: other (see comments)(Home with self-care)    Readmission  Risk of Unplanned Readmission Score: UNPLANNED READMISSION SCORE: 25%  Predictive Model Details           25% (High) Factors Contributing to Score   Calculated 06/05/2019 09:08 26% Number of active Rx orders is 45   Dyer Risk of Unplanned Readmission Model 9% Number of ED visits in last six months is 2     9% Diagnosis of cancer is present     8% Active antipsychotic Rx order is present     8% ECG/EKG order is present in last 6 months     8% Latest calcium is low (7.7 mg/dL)     6% Diagnosis of electrolyte disorder is present     6% Charlson Comorbidity Index is 6     Readmitted Within the Last 30 Days? (No if blank)   Patient at risk for readmission?: Yes    Discharge Plan  Screen findings are: Care Manager reviewed the plan of the patient's care with the Multidisciplinary Team. No discharge planning needs identified at this time. Care Manager will continue to manage plan and monitor patient's progress with the team.    Expected Discharge Date: 06/08/19    Expected Transfer from Critical Care: 06/05/19    Patient and/or family were provided with choice of facilities / services that are available and appropriate to meet post hospital care needs?: N/A    Initial Assessment complete?: Yes

## 2019-06-05 NOTE — Unmapped (Signed)
Pt admitted from ED for etoh withdrawal.  Flat affect and withdrawn.  No specific complaints, but says he does not feel good and needs a drink.  Slight tremors noted.  Oriented and drowsy.  Easily awakens.  Assisted to bathroom several times tonight.  Contact guard assist, safer with BSC.  Several BMs tonight.  Frequent voids.  Uses urinal.  Did not eat dinner.  Poor po intake tonight.  Ns infusing at kvo via PAC.      ST on monitor.  Ativan 2 mg IV given every 4 hours for ciwa score.      No distress.  RA.      No falls but fall risk is high. Skin intact.        Problem: Adult Inpatient Plan of Care  Goal: Plan of Care Review  Outcome: Ongoing - Unchanged  Goal: Patient-Specific Goal (Individualization)  Outcome: Ongoing - Unchanged  Goal: Absence of Hospital-Acquired Illness or Injury  Outcome: Ongoing - Unchanged  Goal: Optimal Comfort and Wellbeing  Outcome: Ongoing - Unchanged  Goal: Readiness for Transition of Care  Outcome: Ongoing - Unchanged  Goal: Rounds/Family Conference  Outcome: Ongoing - Unchanged     Problem: Self-Care Deficit  Goal: Improved Ability to Complete Activities of Daily Living  Outcome: Ongoing - Unchanged     Problem: Fall Injury Risk  Goal: Absence of Fall and Fall-Related Injury  Outcome: Ongoing - Unchanged     Problem: Skin Injury Risk Increased  Goal: Skin Health and Integrity  Outcome: Ongoing - Unchanged     Problem: COPD Comorbidity  Goal: Maintenance of COPD Symptom Control  Outcome: Ongoing - Unchanged     Problem: Pain Chronic (Persistent) (Comorbidity Management)  Goal: Acceptable Pain Control and Functional Ability  Outcome: Ongoing - Unchanged     Problem: Alcohol Withdrawal  Goal: Alcohol Withdrawal Symptom Control  Outcome: Ongoing - Unchanged

## 2019-06-06 NOTE — Unmapped (Signed)
Patient discharged to home with brother. Teach back completed  Problem: Adult Inpatient Plan of Care  Goal: Plan of Care Review  Outcome: Resolved  Goal: Patient-Specific Goal (Individualization)  Outcome: Resolved  Goal: Absence of Hospital-Acquired Illness or Injury  Outcome: Resolved  Goal: Optimal Comfort and Wellbeing  Outcome: Resolved  Goal: Readiness for Transition of Care  Outcome: Resolved  Goal: Rounds/Family Conference  Outcome: Resolved     Problem: Self-Care Deficit  Goal: Improved Ability to Complete Activities of Daily Living  Outcome: Resolved     Problem: Fall Injury Risk  Goal: Absence of Fall and Fall-Related Injury  Outcome: Resolved     Problem: Skin Injury Risk Increased  Goal: Skin Health and Integrity  Outcome: Resolved     Problem: COPD Comorbidity  Goal: Maintenance of COPD Symptom Control  Outcome: Resolved     Problem: Pain Chronic (Persistent) (Comorbidity Management)  Goal: Acceptable Pain Control and Functional Ability  Outcome: Resolved     Problem: Alcohol Withdrawal  Goal: Alcohol Withdrawal Symptom Control  Outcome: Resolved

## 2019-06-06 NOTE — Unmapped (Signed)
CIWA score remain low, thus, requiring no medication coverage this shift, VSS, O2 SATs maintained on RA, no c/o of respiratory distress this shift, denies chest pain and shortness of breath, seizure precaution intact, no withdrawal seizures noted this shift, bed alarm activated for safety, decreased PO intake but with adequate UOP for the shift. Will continue to monitor.    Problem: Adult Inpatient Plan of Care  Goal: Plan of Care Review  Outcome: Progressing  Goal: Patient-Specific Goal (Individualization)  Outcome: Progressing  Goal: Absence of Hospital-Acquired Illness or Injury  Outcome: Progressing  Goal: Optimal Comfort and Wellbeing  Outcome: Progressing  Goal: Readiness for Transition of Care  Outcome: Progressing  Goal: Rounds/Family Conference  Outcome: Progressing

## 2019-06-07 NOTE — Unmapped (Signed)
Patient was discharged from hospital on 06/06/19. The hospital follow-up covering physician reviewed their record and determined patient should receive video or phone hospital follow up. HFU order placed.

## 2019-06-07 NOTE — Unmapped (Signed)
Physician Discharge Summary    Admit date: 06/04/2019    Discharge date and time: 06/05/2021    Discharge to: Home    Discharge Service: Med Undesignated (MDX)    Discharge Attending Physician: No att. providers found    Discharge Diagnoses: Alcohol withdrawal.  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  Toxicology screen was also added for cannabinoids and cocaine, although he denies any cocaine use.  Over concern for seizure from alcohol withdrawal (possible seizure had been reported prior to admission), he was subsequently admitted to the ICU for further evaluation and treatment.    He was admitted to the ICU for fluid resuscitation, CIWA protocol with as needed lorazepam and general supportive care.  Started above, he was reported to have had witnessed withdrawal seizure x2, witnessed I believe by his mother, prior to coming to the hospital.  After admission, while somewhat tremulous and a bit disoriented, he did not have frank seizures and very quickly requested discharge home just 24 hours after admission.  This was not felt to be safe and the patient agreed to stay 1 more day for further treatment.    His home medications were continued including his Duragesic fentanyl patch.  He also was given oxycodone with good control of his bony pain from metastatic disease.  His COPD was not an exacerbation and his blood pressure was well controlled.    He does not appear to be able to follow the advice he received in Oncology clinic to limit himself to a single alcoholic beverage daily, and is in fact drinking enough to have withdrawal seizures and combining it with marijuana and cocaine.  Given his underlying cancer and other health problems, expressed my opinion that I felt it was unsafe for him to drink any alcohol and that he should abstain from any additional habits that are unhealthy although he continues to remain adamant that he did not consume any cocaine.    He subsequently went on to have CIWA scores of 0, was hemodynamically stable without hypotension or tachycardia and felt well enough to leave the hospital.  He was able to eat and drink normally and was discharged home on his prior medical regimen.  He has follow-up with Oncology already established.    He is also counseled on smoking cessation.    Condition at Discharge: stable  Discharge Medications:      Your Medication List      CONTINUE taking these medications    albuterol 90 mcg/actuation inhaler  Commonly known as:  PROVENTIL HFA;VENTOLIN HFA  Inhale 2 puffs every six (6) hours as needed for wheezing.     amLODIPine 10 MG tablet  Commonly known as:  NORVASC  Take 1 tablet (10 mg total) by mouth daily.     busPIRone 5 MG tablet  Commonly known as:  BUSPAR  TAKE 1 TABLET BY MOUTH THREE TIMES A DAY     CREON 24,000-76,000 -120,000 unit Cpdr delayed release capsule  Generic drug:  pancrelipase (Lip-Prot-Amyl)  Take 48,000 units of lipase by mouth Three (3) times a day with a meal. Take an additional 1 capsule with snacks.     diclofenac sodium 1 % gel  Commonly known as:  VOLTAREN  Apply 2 g topically Four (4) times a day. Chest, area of pain     diphenhydrAMINE 25 mg tablet  Commonly known as:  BENADRYL  Take 0.5 tablets (12.5 mg total) by mouth every six (6) hours as needed for itching.     fentaNYL 25 mcg/hr patch  Commonly known as:  DURAGESIC  Place 1 patch on the skin every third day.     magic mouthwash oral suspension  Take 10 mL by mouth 4 (four) times a day as needed. magnesium oxide 400 mg (241.3 mg magnesium) tablet  Commonly known as:  MAG-OX  Take 1 tablet (400 mg total) by mouth Two (2) times a day.     omeprazole 20 MG capsule  Commonly known as:  PriLOSEC  TAKE 2 CAPSULES BY MOUTH ONCE DAILY     oxyCODONE 5 MG immediate release tablet  Commonly known as:  ROXICODONE  Take 2 tablets (10 mg total) by mouth every four (4) hours as needed for pain.     prochlorperazine 10 MG tablet  Commonly known as:  COMPAZINE  Take 1 tablet (10 mg total) by mouth every eight (8) hours as needed.     salmeteroL 50 mcg/dose diskus inhaler  Commonly known as:  SEREVENT  Inhale 1 puff Two (2) times a day.     SPIRIVA WITH HANDIHALER 18 mcg inhalation capsule  Generic drug:  tiotropium  Place 1 capsule (18 mcg total) into inhaler and inhale once daily.     traZODone 50 MG tablet  Commonly known as:  DESYREL  Take 1 tablet (50 mg total) by mouth nightly as needed for sleep.     valACYclovir 500 MG tablet  Commonly known as:  VALTREX  Take 1 tablet (500 mg total) by mouth daily.     XTANDI 40 mg capsule  Generic drug:  enzalutamide  Take 3 capsules (120 mg total) by mouth daily.               Discharge Instructions:   Activity Instructions     Activity as tolerated          Diet Instructions     Discharge diet (specify)      Discharge Nutrition Therapy:  General        Other Instructions     Discharge instructions      Seek help with Alcoholics Anonymous if you cannot stop drinking alcohol.  You are at risk of having more seizures if you drink and then stop suddenly.  Also, it is not safe to drink alcohol with the pain medication you are taking, Fentanyl.  Together, they could cause you to stop breathing and die. Please get some help.         Discharge instructions to patient: Call your primary care doctor and make an appointment to see them:      Within 2 weeks from the time you are discharged from the hospital             Follow Up instructions and Outpatient Referrals     Discharge instructions          Appointments which have been scheduled for you    Jun 28, 2019  9:00 AM EDT  (Arrive by 8:45 AM)  LAB ONLY  with [facility] LAB [facility] [address]  LAB PHLEB [place] [facility] REG[facility]) [address]  [place] [address]  [phone]      Jun 28, 2019  9:30 AM EDT  (Arrive by 9:15 AM)  RETURN FOLLOW UP [place] with Towanda Malkin, MD  [facility] HEMATOLOGY [facility] REGION) [address]  [place] [address]  [phone]      Jun 28, 2019 10:00 AM EDT  (Arrive by 9:45 AM)  Danelle Earthly NURSE with [facility] INJE[facility]  [facility]) [address]  [place] [address]  [phone]      Jun 29, 2019 10:30 AM EDT  (Arrive by 10:15 AM)  PHONE with Neldon Labella, RD/LDN  [facility] INTERNAL MEDICINE [place] [facility]) [address] [place] [address]  [phone]   Please DO NOT come to the clinic for this visit. We will call you to discuss your plan of care.              I spent greater than 30 minutes in the discharge of this patient.  Tawni Levy MD

## 2019-06-09 MED ORDER — INDOMETHACIN 25 MG CAPSULE
ORAL_CAPSULE | 2 refills | 0 days | Status: CP
Start: 2019-06-09 — End: 2019-06-22

## 2019-06-09 NOTE — Unmapped (Signed)
As requested, the patient has returned my call and reviewed the information outlined in the prior note associated with this encounter.    ?? The patient is going to take the indomethacin as prescribed by Dr. Claude Manges and notify our clinic back of the outcome on Monday of next week at the latest.    ?? The patient would like to hold off on seeing Dr. Claude Manges more urgently long with completing any additional lab work at this time.    ?? The patient remains tentatively scheduled to follow-up with Dr. Claude Manges on 06/28/2019.    Otherwise, the patient expressed verbal understanding and had no other questions or concerns at this time.  He is to call the clinic back sooner should he have any other issues.

## 2019-06-09 NOTE — Unmapped (Signed)
Hi,     Joseph Phillips has contacted the Communication Center in regards to the following symptom:     Pain: worsening and Unable to walk    Please contact at [phone]    Check Indicates criteria has been reviewed and confirmed with the patient:    []  Preferred Name   [x]  DOB and/or MR#  [x]  Preferred Contact Method  [x]  Phone Number(s)   []  MyChart     A page or telephone call has been made to the corresponding clinic.     Thank you,  Vernie Ammons   [facility]   [phone]

## 2019-06-09 NOTE — Unmapped (Signed)
As outlined in the prior note, I have returned the patient's call to address the following:    ?? Acute onset of right sided ankle/foot pain that started yesterday, Tuesday, June 08, 2019.  ?? The patient reports no trauma or injury to the affected extremity.  ?? The patient reports the pain has become worse and is unabated despite taking any additional doses of oxycodone.  ?? The pain is described as being so intense that the patient cannot normally ambulate at this time.  ?? The patient also reports his pain/discomfort is so intense that he cannot wear a sock on his foot.  ?? When asked to describe the right lower foot/ankle, he does not report any noticeable edema, change in color though does report it is warmer to the touch when compared to the left side.  ?? The patient reports no prior history of gout.    ?? This information was routed to Dr. Claude Manges for interpretation and review.    An addendum will be made to this encounter to reflect any updates/changes to the patient's plan of care.

## 2019-06-09 NOTE — Unmapped (Signed)
Several attempts have been made to contact the patient without success.  Upon the patient calling the clinic back, the information below will be reviewed with him.    ?? Dr. Claude Manges believes that the patient may be experiencing an acute episode of gout.    ?? A trial of indomethacin is recommended and a prescription has been sent electronically to [facility] on [address] in [place].  ?? Instructions for dosing are:   ?? Take 50mg  to start  ?? 25 mg every 8 hours for 1 day  ?? 25 mg every 12 hours for 1 day  ?? Then 25 mg once  ?? Then hold any further dosing until advised by the care team.    ?? Ideally, in-clinic evaluation would be best if the patient is agreeable.    ?? Also, evaluation of the patient's uric acid level would be helpful.    ?? Should the patient's pain significantly improve with taking indomethacin, it would then be reasonable to start him on a course of allopurinol.    An addendum will be made this encounter by the patient returning my call and any other questions or concerns will be addressed at that time.

## 2019-06-09 NOTE — Unmapped (Signed)
Hi,     Patient contacted the Communication Center regarding the following:    - Would like to know what to do regarding pain in his foot.    Please contact patient at [phone].    Thanks in advance,    [name]  [facility]   [phone]

## 2019-06-11 NOTE — Unmapped (Signed)
Unable to reach/no voicemail:  The Hospital Follow-Up Enhanced Care Team attempted to reach pt on June 11, 2019  remind them of their hospital follow up phone visit on 06/14/2019. Patient was unable to be reached and did not have a voicemail available.

## 2019-06-14 ENCOUNTER — Institutional Professional Consult (permissible substitution): Admit: 2019-06-14 | Discharge: 2019-06-15 | Payer: MEDICAID | Attending: Internal Medicine | Primary: Internal Medicine

## 2019-06-14 DIAGNOSIS — Z09 Encounter for follow-up examination after completed treatment for conditions other than malignant neoplasm: Secondary | ICD-10-CM

## 2019-06-14 DIAGNOSIS — C61 Malignant neoplasm of prostate: Secondary | ICD-10-CM

## 2019-06-14 DIAGNOSIS — F10232 Alcohol dependence with withdrawal with perceptual disturbance: Secondary | ICD-10-CM

## 2019-06-14 DIAGNOSIS — M150 Primary generalized (osteo)arthritis: Secondary | ICD-10-CM

## 2019-06-14 DIAGNOSIS — F1020 Alcohol dependence, uncomplicated: Principal | ICD-10-CM

## 2019-06-14 DIAGNOSIS — J449 Chronic obstructive pulmonary disease, unspecified: Secondary | ICD-10-CM

## 2019-06-14 DIAGNOSIS — G4700 Insomnia, unspecified: Secondary | ICD-10-CM

## 2019-06-14 DIAGNOSIS — M170 Bilateral primary osteoarthritis of knee: Secondary | ICD-10-CM

## 2019-06-14 MED ORDER — DICLOFENAC 1 % TOPICAL GEL
Freq: Four times a day (QID) | TOPICAL | 5 refills | 0 days | Status: CP
Start: 2019-06-14 — End: 2020-06-13

## 2019-06-14 MED ORDER — PROCHLORPERAZINE MALEATE 10 MG TABLET
ORAL_TABLET | Freq: Three times a day (TID) | ORAL | 0 refills | 0 days | Status: SS | PRN
Start: 2019-06-14 — End: 2019-06-23

## 2019-06-14 MED ORDER — MELATONIN 3 MG CAPSULE
ORAL_CAPSULE | Freq: Every evening | ORAL | 1 refills | 0.00000 days | Status: CP | PRN
Start: 2019-06-14 — End: 2019-06-22

## 2019-06-14 MED ORDER — ALBUTEROL SULFATE HFA 90 MCG/ACTUATION AEROSOL INHALER
Freq: Four times a day (QID) | RESPIRATORY_TRACT | 3 refills | 0.00000 days | Status: CP | PRN
Start: 2019-06-14 — End: 2019-07-13

## 2019-06-14 NOTE — Unmapped (Signed)
REASON FOR VISIT: Hospital Follow-up Care Management    PLAN:  #Disability Benefits  Pt was provided with Social [employer] [phone]) contact information and was encouraged to call to follow up to request status of his benefits.    #Ensure  LCSW informed pt she will follow up with Dorien Chihuahua, RD about Ensure coupons she had discussed with him.    #Eye Exam appt  LCSW will research optometrists near pt's home where pt can get an eye exam sooner than September, as he reported this was the 1st available appt at a place he had called.      #Contact information  Pt was provided LCSW's contact information to call as needed.    #F/u calls  Pt will receive 2 weeks of telephone f/u from a care team member following visit. Pt encouraged to contact LCSW should needs arise.    ___________________________________________________     HISTORY AND ASSESSMENT:   Joseph Phillips was receptive to Phone Visit with the Internal Medicine clinic for hospital f/u. Pt is by himself to complete this appt. He reports feeling okay and bad, I take it day by day since discharge.     Pt lives with his mother in [place]. He denies issues with home safety. The patient receives support from his mother and his oldest brother Joseph Phillips, who lives nearby. Family, social and cultural characteristics were assessed during the visit and plan.     He is not set up with home health services. Denies having personal care service/personal aide support at this time and denies needs of these services. Says mother gives him emotional support.    Pt says he uses a cane as need to move around, reports this cane used to be his deceased brother's cane. PT shares his brother passed last year.    Identified barriers to care:  Denies affordability difficulties, pt reports however, that he was approved for Disability Benefits, but hasn't started receiving disability check.  Denies transportation barriers, relies on brother Joseph Phillips to go to his medical appts. Pt says he's never used [employer], but he knows how to arrange it if need it.  Denies food insecurity, but identifies difficulties affording Ensure, says he is waiting for some coupons the Dietician was sending him.  Denies auditory communication challenges  Reports visual communication challenges my eyes are bad says he scheduled an appt, but was not given one until September. Pt says he'd like a sooner appt I need my glasses so I can see better  Denies illiteracy     Mental Health/Substance Use:  Alcohol use:  Reports he drinks a couple of beers on the weekends. Denies alcohol use during week days.  Illicit Substance use: denies  Tobacco use:  reports that he has been smoking cigarettes. He started smoking about 25 years ago. He has a 4.20 pack-year smoking history. He has never used smokeless tobacco.  PHQ9: Scored 11, negative for SI and HI. Declines mental health resources, pt agrees to reach out to LCSW if he'd like this information.    Advanced Directives: does not have on file.    Visit discussed directly w/ Dr.Whelan via [facility] chat and she will call pt next.    ___________________________________________________   Ralph Dowdy, MSW, LCSW  Care Manager - [facility] Internal Medicine  Phone: [phone]  Pager: [phone]    Medication adherence and barriers to the treatment plan have been addressed. Opportunities to optimize healthy behaviors have been discussed. Patient / caregiver voiced understanding.

## 2019-06-14 NOTE — Unmapped (Signed)
.  Previsit complete    Time spent on phone:     Reason for visit: Hospital Follow-up    Questions / Concerns that need to be addressed:    General Consent to Treat (GCT) for non-epic video visits only: Verbal consent    Vitals signs, if any:       Diabetes:  ? Regularly checking blood sugars?: no  o If yes, when? Complete log  Date Before Breakfast After Breakfast Before Lunch After Lunch Before Dinner After Dinner Before Bed                                                                                                                                 Hypertension:  ? Have blood pressure cuff at home?: no  ? Regularly checking blood pressure?: no  If yes, complete log  Date Time BP Pulse                                                                           Weight:    Date          Allergies reviewed: Yes    Medication reviewed: Yes    Refills needed, if any:     Travel Screening: not applicable    Falls Risk: completed    Hark (DV) Screening: completed    HCDM reviewed and updated in Epic:  Completed      HARK Screening  HARK Screening  Within the last year, have you been humiliated or emotionally abused in other ways by your partner or ex-partner?: No  Within the last year, have you been afraid of your partner or ex-partner?: No  Within the last year, have you been raped or forced to have any kind of sexual activity by your partner or ex-partner?: No  Within the last year, have you been kicked, hit, slapped, or otherwise physically hurt by your partner or ex-partner?: No    AUDIT       PHQ2       PHQ9  Thoughts that you would be better off dead, or of hurting yourself in some [place]: Not at all  PHQ-9 TOTAL SCORE: 11    GAD7       COPD Assessment

## 2019-06-14 NOTE — Unmapped (Signed)
Internal Medicine Phone Visit    This visit is conducted via telephone.    Contact Information  Person Contacted: Patient  Contact Phone number: [phone] (home) [phone] (work)  Is there someone else in the room? No.   Patient agreed to a phone visit    [name] is a 58 y.o. male  participating in a telephone encounter.    Reason for Call  Joseph Phillips Follow    [facility]ill[facility] Course: The patient is a 58 year old male with the history of alcohol abuse as well as metastatic prostate cancer who presented to the emergency room intoxicated from drinking alcohol requesting detox.  Toxicology screen was also added for cannabinoids and cocaine, although he denies any cocaine use.  Over concern for seizure from alcohol withdrawal (possible seizure had been reported prior to admission), he was subsequently admitted to the ICU for further evaluation and treatment.  ??  He was admitted to the ICU for fluid resuscitation, CIWA protocol with as needed lorazepam and general supportive care.  Started above, he was reported to have had witnessed withdrawal seizure x2, witnessed I believe by his mother, prior to coming to the [facility].  After admission, while somewhat tremulous and a bit disoriented, he did not have frank seizures and very quickly requested discharge home just 24 hours after admission.  This was not felt to be safe and the patient agreed to stay 1 more day for further treatment.  ??  His home medications were continued including his Duragesic fentanyl patch.  He also was given oxycodone with good control of his bony pain from metastatic disease.  His COPD was not an exacerbation and his blood pressure was well controlled.  ??  He does not appear to be able to follow the advice he received in Oncology clinic to limit himself to a single alcoholic beverage daily, and is in fact drinking enough to have withdrawal seizures and combining it with marijuana and cocaine.  Given his underlying cancer and other health problems, expressed my opinion that I felt it was unsafe for him to drink any alcohol and that he should abstain from any additional habits that are unhealthy although he continues to remain adamant that he did not consume any cocaine.  ??  He subsequently went on to have CIWA scores of 0, was hemodynamically stable without hypotension or tachycardia and felt well enough to leave the [facility].  He was able to eat and drink normally and was discharged home on his prior medical regimen.  He has follow-up with Oncology already established.    Subjective:  Feeling weak today. With cancer has good days and bad days. Reports he was admitted for chest pains. Using topical therapy on chest which is helping. He continues to feel weak with low energy. Called oncology for foot pain, diagnosed with gout. Pain improved with indomethacin. Having difficulty with sleeping. Feels like trazodone is not helping. Has not had anything to drink (EtOH) since discharge. Does not feel like he needs resources at present. No fevers or chills. One episode of chest pain since discharge. His brother rubbed ointment (diclofenac) on his chest and pain improved, requesting refill. Also has knee OA and OA of multiple joints. Would not recommend chronic NSAID use due to cancer.    No change in respiratory symptoms. Breathing is not too bad. Using albuterol once/day. Needs a refill on albuterol inhaler.     Has an appointment scheduled with oncology (Joseph Phillips) next week.     I have reviewed the  problem list, medications, and allergies and have updated/reconciled them if needed.    Objective:  Speaking in full sentences. No respiratory distress. Limited by phone visit.    Assessment & Plan:  1. Alcohol use disorder, severe, dependence (CMS-HCC)    2. 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 Chronic obstructive pulmonary disease, unspecified COPD type (CMS-HCC)    4. Insomnia, unspecified type    5. Primary osteoarthritis involving multiple joints    6. Primary osteoarthritis of both knees    7. 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  Now reports abstinence since discharge.  Denies any symptoms of withdrawal and is overall doing well  -Offered resources for sustained abstinence, feels well supported this time and denies need for any additional resources  -Encouraged continued abstinence given severity of withdrawal in the past    #Chronic obstructive pulmonary disease, unspecified COPD type (CMS-HCC)  Stable, reports symptoms have improved after starting new inhaler.  Currently on LAMA, LABA and LABA as needed.  Requesting refill of albuterol  -Due to Spiriva and salmeterol inhaler  - albuterol HFA 90 mcg/actuation inhaler; Inhale 2 puffs every six (6) hours as needed for wheezing.  Dispense: 2 Inhaler; Refill: 3    #Insomnia, unspecified type  Reports ongoing difficulty sleeping.  This may be related to recently becoming abstinent from alcohol.  Will avoid benzodiazepine at this time given history of alcohol abuse.  We will do a trial of melatonin, it appears he has some day night reversal.  - melatonin 3 mg cap; Take 3 mg by mouth nightly as needed.  Dispense: 30 capsule; Refill: 1    #Primary osteoarthritis involving multiple joints,  Primary osteoarthritis of both knees, chest pain  Evaluated for chest pain during admission.  Thought to be musculoskeletal.  He has had 1 episode since discharge which resolved with using topical Voltaren gel, however he needs a refill.  Also has chronic pain for which she is on opioids, receiving this through his oncologist.  -Continue pain medications per oncology  -Also treated for gout, now resolved  - diclofenac sodium (VOLTAREN) 1 % gel; Apply 2 g topically Four (4) times a day. Any area of pain  Dispense: 200 g; Refill: 5    #Prostate cancer (CMS-HCC)  Metastatic, on chemotherapy and follows closely with oncology.  Has had difficulty with weight loss and malnutrition.  Spoke with social work during this visit and they are helping him get approval for Ensure supplements  -Follow-up with oncology as scheduled  -SW to work on nutritional supplement        Return in about 4 weeks (around 07/12/2019).     I spent 23 minutes on the phone with the patient. I spent an additional 7 minutes on pre- and post-visit activities.     The patient was physically located in [place] or a state in which I am permitted to provide care. The patient and/or parent/guardian understood that s/he may incur co-pays and cost sharing, and agreed to the telemedicine visit. The visit was reasonable and appropriate under the circumstances given the patient's presentation at the time.    The patient and/or parent/guardian has been advised of the potential risks and limitations of this mode of treatment (including, but not limited to, the absence of in-person examination) and has agreed to  be treated using telemedicine. The patient's/patient's family's questions regarding telemedicine have been answered.     If the phone/video visit was completed in an ambulatory setting, the patient and/or parent/guardian has also been advised to contact their provider???s office for worsening conditions, and seek emergency medical treatment and/or call 911 if the patient deems either necessary.

## 2019-06-16 NOTE — Unmapped (Signed)
CARE MANAGEMENT ENCOUNTER    LCSW completed call with pt during HFU appt on 6/22 and pt identified needing assistance with following up on Ensure coupons clinic's Dietitian was going to get for him and in getting a sooner eye exam appt than September.    LCSW completed call with pt today and discussed the following:    #Ensure  LCSW had sent message to [facility] Dietitian, Dorien Chihuahua RD, LDN, asking about Ensure coupons pt reported she would get for him, LCSW received response from Ms. Sibenge informing that she is pending to hear back from one of the RDs at the [facility].    LCSW shared above with pt.    #Eye Exam  LCSW confirmed that Medicaid covers routine eye exams and select visual aids. LCSW informed pt above and provided him with The following Optometrist options.    [facility]  [phone]  [address]  [place], [place] [address]  Accept Medicaid  First available appt for eye exam July 17     [facility]  [phone]  [address] [place], [place] [address]  Accept Medicaid  First available appt for eye exam July 20th     Pt was encouraged to call LCSW back as needed.    Time Spent: 12 minutes    Jennell Corner, MSW, LCSW  [facility]- [facility] Internal Medicine  Phone: [phone]  Pager: [phone]

## 2019-06-17 NOTE — Unmapped (Signed)
Prior authorization sent to plan on 06/17/2019 with/ from [facility], [id number], via website.    Medication: Diclofenac Gel    Reference ID: [id number] W     Status: waiting for plan determination; determination expected within 1-2 business days via website.    Pharmacy Notified: no.    Patient Notified: no.    Provider Notified: no.    Clinical Information: Awaiting Determination. N.Raines RMA.

## 2019-06-17 NOTE — Unmapped (Signed)
PA Approved through 07/17/2019

## 2019-06-21 NOTE — Unmapped (Signed)
Hi,    Patient Joseph Phillips called requesting a medication refill for the following:    ? Medication: Roxicodone  ? Dosage: 5MG   ? Days left of medication: 0  ? Pharmacy: [facility]    The expected turnaround time is 3-4 business days       Check Indicates criteria has been reviewed and confirmed with the patient:    [x]  Preferred Name   [x]  DOB and/or MR#  [x]  Preferred Contact Method  [x]  Phone Number(s)   [x]  Preferred Pharmacy   []  MyChart     Thank you,  Drema Balzarine  [facility]  [phone]

## 2019-06-21 NOTE — Unmapped (Signed)
[facility] Refill Coordination Note    Specialty Medication(s) to be Shipped:   General Specialty: Joseph Phillips 40mg     Other medication(s) to be shipped:     Joseph Phillips, DOB: Nov 06, 1961  Phone: [phone] (home) [phone] (work)      All above HIPAA information was verified with patient.     Completed refill call assessment today to schedule patient's medication shipment from the [facility] [phone]).       Specialty medication(s) and dose(s) confirmed: Regimen is correct and unchanged.   Changes to medications: Joseph Phillips reports starting the following medications: Prochlorperazine 10mg   Changes to insurance: No  Questions for the pharmacist: No    Confirmed patient received Welcome Packet with first shipment. The patient will receive a drug information handout for each medication shipped and additional FDA Medication Guides as required.       DISEASE/MEDICATION-SPECIFIC INFORMATION         N/A    SPECIALTY MEDICATION ADHERENCE     Medication Adherence    Patient reported X missed doses in the last month:  0  Specialty Medication:  xtandi 40mg   Patient is on additional specialty medications:  No  Support network for adherence:  family member                Xtandi 40 mg: 7 days of medicine on hand         SHIPPING     Shipping address confirmed in Epic.     Delivery Scheduled: Yes, Expected medication delivery date: 07/02.     Medication will be delivered via Next Day Courier to the home address in Epic WAM.    Joseph Phillips   [facility] Specialty Technician

## 2019-06-22 ENCOUNTER — Ambulatory Visit: Admit: 2019-06-22 | Discharge: 2019-06-23 | Disposition: A | Discharge status: Home / Self Care | Payer: MEDICAID

## 2019-06-22 DIAGNOSIS — K2920 Alcoholic gastritis without bleeding: Principal | ICD-10-CM

## 2019-06-22 LAB — COMPREHENSIVE METABOLIC PANEL
ALBUMIN: 3.4 g/dL — ABNORMAL LOW (ref 3.5–5.0)
ALKALINE PHOSPHATASE: 255 U/L — ABNORMAL HIGH (ref 38–126)
ALT (SGPT): 22 U/L (ref <50)
ANION GAP: 10 mmol/L (ref 7–15)
AST (SGOT): 72 U/L — ABNORMAL HIGH (ref 19–55)
BILIRUBIN TOTAL: 0.4 mg/dL (ref 0.0–1.2)
BLOOD UREA NITROGEN: 7 mg/dL (ref 7–21)
BUN / CREAT RATIO: 10
CALCIUM: 7.9 mg/dL — ABNORMAL LOW (ref 8.5–10.2)
CHLORIDE: 111 mmol/L — ABNORMAL HIGH (ref 98–107)
CO2: 17 mmol/L — ABNORMAL LOW (ref 22.0–30.0)
CREATININE: 0.68 mg/dL — ABNORMAL LOW (ref 0.70–1.30)
EGFR CKD-EPI AA MALE: >90 mL/min/{1.73_m2} (ref >=60)
EGFR CKD-EPI NON-AA MALE: >90 mL/min/{1.73_m2} (ref >=60)
GLUCOSE RANDOM: 98 mg/dL (ref 70–179)
PROTEIN TOTAL: 6.2 g/dL — ABNORMAL LOW (ref 6.5–8.3)
SODIUM: 138 mmol/L (ref 135–145)

## 2019-06-22 LAB — CBC W/ AUTO DIFF
BASOPHILS ABSOLUTE COUNT: 0 10*9/L (ref 0.0–0.1)
BASOPHILS RELATIVE PERCENT: 0.4 %
EOSINOPHILS ABSOLUTE COUNT: 0.1 10*9/L (ref 0.0–0.4)
EOSINOPHILS RELATIVE PERCENT: 0.6 %
HEMATOCRIT: 28.5 % — ABNORMAL LOW (ref 41.0–53.0)
HEMOGLOBIN: 9.9 g/dL — ABNORMAL LOW (ref 13.5–17.5)
LARGE UNSTAINED CELLS: 2 % (ref 0–4)
LYMPHOCYTES ABSOLUTE COUNT: 1.3 10*9/L — ABNORMAL LOW (ref 1.5–5.0)
LYMPHOCYTES RELATIVE PERCENT: 13.2 %
MEAN CORPUSCULAR HEMOGLOBIN CONC: 34.9 g/dL (ref 31.0–37.0)
MEAN CORPUSCULAR HEMOGLOBIN: 33.7 pg (ref 26.0–34.0)
MEAN CORPUSCULAR VOLUME: 96.4 fL (ref 80.0–100.0)
MONOCYTES RELATIVE PERCENT: 6.1 %
NEUTROPHILS ABSOLUTE COUNT: 7.8 10*9/L — ABNORMAL HIGH (ref 2.0–7.5)
NEUTROPHILS RELATIVE PERCENT: 77.8 %
RED BLOOD CELL COUNT: 2.95 10*12/L — ABNORMAL LOW (ref 4.50–5.90)
RED CELL DISTRIBUTION WIDTH: 13.8 % (ref 12.0–15.0)
WBC ADJUSTED: 10 10*9/L (ref 4.5–11.0)

## 2019-06-22 LAB — URINALYSIS WITH CULTURE REFLEX
BILIRUBIN UA: NEGATIVE
BLOOD UA: NEGATIVE
GLUCOSE UA: NEGATIVE
KETONES UA: NEGATIVE
PH UA: 6 (ref 5.0–9.0)
PROTEIN UA: NEGATIVE
RBC UA: 3 /HPF — ABNORMAL HIGH (ref <3)
SPECIFIC GRAVITY UA: 1.01 (ref 1.005–1.040)
SQUAMOUS EPITHELIAL: 6 /HPF — ABNORMAL HIGH (ref 0–5)
UROBILINOGEN UA: 0.2
WBC UA: 8 /HPF — ABNORMAL HIGH (ref <2)

## 2019-06-22 LAB — BLOOD GAS, VENOUS
BASE EXCESS VENOUS: -5.6 — ABNORMAL LOW (ref -2.0–2.0)
O2 SATURATION VENOUS: 77.7 % (ref 40.0–85.0)
PCO2 VENOUS: 37 mmHg — ABNORMAL LOW (ref 40–60)
PH VENOUS: 7.35 (ref 7.32–7.43)
PO2 VENOUS: 47 mmHg (ref 30–55)

## 2019-06-22 LAB — LIPASE: Triacylglycerol lipase:CCnc:Pt:Ser/Plas:Qn:: 32 — ABNORMAL LOW

## 2019-06-22 LAB — MAGNESIUM: Magnesium:MCnc:Pt:Ser/Plas:Qn:: 1.8

## 2019-06-22 LAB — O2 SATURATION VENOUS: Oxygen saturation:MFr:Pt:BldV:Qn:: 77.7

## 2019-06-22 LAB — BLOOD UA: Lab: NEGATIVE

## 2019-06-22 LAB — PRO-BNP: Natriuretic peptide.B prohormone N-Terminal:MCnc:Pt:Ser/Plas:Qn:: 161

## 2019-06-22 LAB — LACTATE BLOOD VENOUS: Lactate:SCnc:Pt:BldV:Qn:: 2.1 — ABNORMAL HIGH

## 2019-06-22 LAB — MEAN CORPUSCULAR VOLUME: Lab: 96.4

## 2019-06-22 LAB — CALCIUM: Calcium:MCnc:Pt:Ser/Plas:Qn:: 7.9 — ABNORMAL LOW

## 2019-06-22 LAB — ETHANOL: Ethanol:MCnc:Pt:Ser/Plas:Qn:GC: 110 — ABNORMAL HIGH

## 2019-06-22 NOTE — Unmapped (Signed)
CARE MANAGEMENT ENCOUNTER    LCSW attempted call with pt to inform RD at [facility] will send him coupon for Ensure. LCSW was unable to reach pt and no VM was set up.    Time Spent: 1 minute    Jennell Corner, MSW, [facility]  [facility] Manager- [facility] Internal Medicine  Phone: [phone]  Pager: [phone]

## 2019-06-22 NOTE — Unmapped (Signed)
[facility] Emergency Department Provider Note    ED Clinical Impression     Final diagnoses:   Reflux esophagitis   Acute alcoholic gastritis without hemorrhage (Primary)       Initial Impression, ED Course, Assessment and Plan     This is a 58 yo M with a h/o metastatic prostate ca on chemo, chronic pancreatitis, alcohol use d/o, COPD, HTN who presents with 3 days of n/v, abd pain and bone pain. Also reports rash on LLE and BLE edema, subjective fevers and SOB. No COVID exposures. VS sig for tachycardia. PE sig for chronically ill appearance, uncomfortable, dry mucus membranes, lungs CTAB, tachycardia, abd soft, diffusely tender with guarding, no masses, 2+ BLE pitting edema and an erythematous patch on the medial left thigh. DDx includes gastritis, pancreatitis, pyelonephritis, bacteremia. Also consider partial SBO. Plan for labs, CT a/p, EKG, CXR, admission.    Care was transferred to Dr. Doreene Nest at 0700. CT was pending at that time. Labs sig for elevated EtOH, Alk phos, lactate. COVID negative. CXR with no acute process.     I independently visualized the EKG tracing.   I independently visualized the radiology images.   I reviewed the patient's prior medical records.     ____________________________________________    Time seen: June 22, 2019 5:06 AM       History     Chief Complaint  Emesis      HPI   Joseph Phillips is a 58 y.o. male with a h/o prostate ca with mets to the bone (s/p XRT, currently on chemo), chronic pancreatitis, alcohol use d/o, COPD and HTN who presents with 3 days of n/v, abd pain and bone pain. Abd pain is moderate in severity, cramping and diffuse. It waxes and wanes. He also reports 3 days of dyspnea without cough, fever, CP. In addition, he has several days of an erythematous rash over his medial left thigh and 1 month of BLE edema. He also   His last chemo treatment was 1 month ago. He has an appointment for another treatment in July.           Past Medical History:   Diagnosis Date   ??? Alcohol abuse    ??? COPD (chronic obstructive pulmonary disease) (CMS-HCC)    ??? Hx of radiation therapy    ??? Hypertension    ??? Polysubstance abuse (CMS-HCC)    ??? Prostate cancer (CMS-HCC)    ??? Seizures (CMS-HCC)        Past Surgical History:   Procedure Laterality Date   ??? IR INSERT PORT AGE GREATER THAN 5 YRS  07/15/2018    IR INSERT PORT AGE GREATER THAN 5 YRS 07/15/2018 Carolin Coy, MD IMG [facility]   ??? PR COLSC FLX W/RMVL OF TUMOR POLYP LESION SNARE TQ N/A 08/16/2015    Procedure: COLONOSCOPY FLEX; W/REMOV TUMOR/LES BY SNARE;  Surgeon: Gwen Pounds, MD;  [facility];  Service: Gastroenterology   ??? PR COLSC FLX WITH DIRECTED SUBMUCOSAL NJX ANY SBST N/A 08/16/2015    Procedure: COLONOSCOPY, FLEXIBLE, PROXIMAL TO SPLENIC FLEXURE; WITH DIRECTED SUBMUCOSAL INJECTION(S), ANY SUBSTANCE;  Surgeon: Gwen Pounds, MD;  Location: [facility];  Service: Gastroenterology   ??? PROSTATE SURGERY           Current Facility-Administered Medications:   ???  MORPhine 4 mg/mL injection 4 mg, 4 mg, Intravenous, Once, Jamas Lav, MD  ???  ondansetron [facility]) injection 4 mg, 4 mg, Intravenous,  Once, Jamas Lav, MD  ???  sodium chloride 0.9% (NS BOLUS) bolus 1,000 mL, 1,000 mL, Intravenous, Once, Jamas Lav, MD    Current Outpatient Medications:   ???  albuterol HFA 90 mcg/actuation inhaler, Inhale 2 puffs every six (6) hours as needed for wheezing., Disp: 2 Inhaler, Rfl: 3  ???  amLODIPine (NORVASC) 10 MG tablet, Take 1 tablet (10 mg total) by mouth daily., Disp: 30 tablet, Rfl: 11  ???  busPIRone (BUSPAR) 5 MG tablet, TAKE 1 TABLET BY MOUTH THREE TIMES A DAY, Disp: 270 tablet, Rfl: 3  ???  diclofenac sodium (VOLTAREN) 1 % gel, Apply 2 g topically Four (4) times a day. Any area of pain, Disp: 200 g, Rfl: 5  ???  diphenhydrAMINE (BENADRYL) 25 mg tablet, Take 0.5 tablets (12.5 mg total) by mouth every six (6) hours as needed for itching., Disp: 30 capsule, Rfl: 0  ??? diphenhydramine HCl (MAGIC MOUTHWASH ORAL) suspension, Take 10 mL by mouth 4 (four) times a day as needed., Disp: 120 mL, Rfl: 1  ???  enzalutamide (XTANDI) 40 mg capsule, Take 3 capsules (120 mg total) by mouth daily., Disp: 90 capsule, Rfl: 5  ???  fentaNYL (DURAGESIC) 25 mcg/hr patch, Place 1 patch on the skin every third day., Disp: 10 patch, Rfl: 0  ???  indomethacin (INDOCIN) 25 MG capsule, Take 2 capsules, and then 1 capsule every 8 hours for 3 doses, then every 12 hours for 2 doses, and then daily for 1 dose.  May repeat if needed. (Patient not taking: Reported on 06/14/2019), Disp: 30 capsule, Rfl: 2  ???  melatonin 3 mg cap, Take 3 mg by mouth nightly as needed., Disp: 30 capsule, Rfl: 1  ???  omeprazole (PRILOSEC) 20 MG capsule, TAKE 2 CAPSULES BY MOUTH ONCE DAILY, Disp: 180 capsule, Rfl: 3  ???  oxyCODONE (ROXICODONE) 5 MG immediate release tablet, Take 2 tablets (10 mg total) by mouth every four (4) hours as needed for pain., Disp: 100 tablet, Rfl: 0  ???  pancrelipase, Lip-Prot-Amyl, (CREON) 24,000-76,000 -120,000 unit CpDR delayed release capsule, Take 48,000 units of lipase by mouth Three (3) times a day with a meal. Take an additional 1 capsule with snacks., Disp: , Rfl:   ???  prochlorperazine (COMPAZINE) 10 MG tablet, Take 1 tablet (10 mg total) by mouth every eight (8) hours as needed., Disp: 30 tablet, Rfl: 0  ???  salmeteroL (SEREVENT) 50 mcg/dose diskus inhaler, Inhale 1 puff Two (2) times a day., Disp: 1 Inhaler, Rfl: 12  ???  tiotropium (SPIRIVA WITH HANDIHALER) 18 mcg inhalation capsule, Place 1 capsule (18 mcg total) into inhaler and inhale once daily., Disp: 90 capsule, Rfl: 3  ???  traZODone (DESYREL) 50 MG tablet, Take 1 tablet (50 mg total) by mouth nightly as needed for sleep., Disp: 90 tablet, Rfl: 3  ???  valACYclovir (VALTREX) 500 MG tablet, Take 1 tablet (500 mg total) by mouth daily., Disp: 90 tablet, Rfl: 3    Allergies  Penicillins and Tramadol    Family History   Problem Relation Age of Onset   ??? Heart disease Mother    ??? Diabetes Mother    ??? Diabetes Father    ??? Melanoma Neg Hx    ??? Basal cell carcinoma Neg Hx    ??? Squamous cell carcinoma Neg Hx        Social History  Social History     Tobacco Use   ??? Smoking status: Current Some Day Smoker  Packs/day: 0.20     Years: 21.00     Pack years: 4.20     Types: Cigarettes     Start date: 05/09/1994   ??? Smokeless tobacco: Never Used   ??? Tobacco comment: smoking anywhere from 0-3 cigarettes daily   Substance Use Topics   ??? Alcohol use: Yes     Alcohol/week: 7.0 standard drinks     Types: 7 Cans of beer per week   ??? Drug use: Not Currently     Types: Marijuana       Review of Systems  Constitutional: Negative for fever.  Eyes: Negative for visual changes.  ENT: Negative for sore throat.  Cardiovascular: Negative for chest pain.  Respiratory: Positive for shortness of breath.  Gastrointestinal: Positive for abdominal pain, vomiting, negative for diarrhea.  Genitourinary: Negative for dysuria.  Musculoskeletal: Positive for generalize bone pain.  Skin: Negative for rash.  Neurological: Negative for headaches, weakness or numbness.      Physical Exam     VITAL SIGNS:    ED Triage Vitals [06/22/19 0446]   Enc Vitals Group      BP 137/80      Heart Rate 101      SpO2 Pulse       Resp 18      Temp 36.7 ??C (98 ??F)      Temp Source Oral      SpO2 99 %      Weight 74.8 kg (165 lb)      Height 1.74 m (5' 8.5)       Constitutional: Alert and oriented. Chronically ill appearing, uncomfortable, dehydrated.  Eyes: Conjunctivae are normal.  ENT       Head: Normocephalic and atraumatic.       Nose: No congestion.       Mouth/Throat: Mucous membranes are dry.       Neck: No stridor, neck is supple.  Hematological/Lymphatic/Immunilogical: No cervical lymphadenopathy.  Cardiovascular: Normal rate, regular rhythm. No m/g/r. Normal and symmetric distal pulses are present in all extremities.   Respiratory: Normal respiratory effort. Breath sounds are normal. No wheezes, rales, rhonchi.  Gastrointestinal: Soft and nondistended, diffusely tender. There is no CVA tenderness.  Musculoskeletal: Nontender with normal range of motion in all extremities. 2+ BLE pitting edema.  Neurologic: Normal speech and language. No gross focal neurologic deficits are appreciated.    Skin: Skin is warm, dry and intact. No rash noted.  Psychiatric: Mood and affect are normal. Speech and behavior are normal.      EKG    Adult ECG Report     Name: Joseph Phillips   Age: 58 y.o.   Gender: male       Rate: 98   Rhythm: normal sinus rhythm   Narrative Interpretation: prolonged QTc, LAD      Radiology     CT Abdomen Pelvis W IV Contrast Only   Final Result   --No acute pathology within the abdomen or pelvis.      --Diffuse osseous sclerotic metastases, similar to prior exam from 03/10/2019.      --Additional chronic/incidental findings, as detailed above.      XR Chest 1 view Portable   Final Result   Low lung volumes and bibasilar atelectasis.      Redemonstrated focal sclerosis of the left humeral neck and scapula concerning for metastatic disease.                  Pertinent labs & imaging results  that were available during my care of the patient were reviewed by me and considered in my medical decision making (see chart for details).     Jamas Lav, MD  06/23/19 [phone]

## 2019-06-22 NOTE — Unmapped (Signed)
[facility] History and Physical      Primary Care Physician: Unknown Foley, MD  Code Status: Full Code      Assessment/Plan:   Principal Problem:    Acute alcoholic gastritis without hemorrhage  Active Problems:    Reflux esophagitis    Tobacco use disorder    Prostate cancer (CMS-HCC)    Essential hypertension    Anxiety    COPD (chronic obstructive pulmonary disease) (CMS-HCC)    Chronic pancreatitis (CMS-HCC)    Alcohol withdrawal syndrome without complication (CMS-HCC)  Resolved Problems:    * No resolved hospital problems. *         Summary Statement:   In summary, Joseph Phillips is a 58 y.o. y/o male with alcoholism who presents to [facility] with suspected alcoholic gastritis and showing signs of entering withdrawal    Assessment and Plan by Problem:    Acute alcoholic gastritis without hemorrhage:  - most likely etiology of pain. Will treat with scheduled BID PPI and H2 blocker. Start sucralfate. Small doses of IV morphine prn breakthru pain. IVF + antiemetics    Alcohol withdrawal:  Will start on scheduled librium 25 q8 and do CIWA based oral ativan. Scheduled thiamine.  - will f/u mag levels and replete as indicated (slightly prolonged QTc noted on admission)    Prostate cancer  - will continue home oral chemo meds if possible. If not, will resume upon discharge.  - continue home oral oxycodone prn    Anxiety  - continue buspar. My understanding is that buspar typically isn't used as monotherapy so he might benefit from discussing this with his PCP    COPD  Continue home inhalers      *DVT Prophylaxis: lovenox         ------------------------------------------------------      Chief Complaint: n/v, abdominal pain.    History of Present Illness:     Joseph Phillips is a 58 y.o. y/o male with PMHx of alcoholism with alcohol withdrawal episodes c/b seziures, alcoholic pancreatitis, anxiety, chronic pancreatitis and metastatic prostate cancer on chemo that presents to [facility] with 3 days n/v and abdominal pain.    Reports symptoms started with nausea and vomiting and progressed to include epigastrium pain which he thinks is due to throwing up. No melena or hematemesis or coffee-grounds. Reports burning in throat and not being able to keep any food down. Denies NSAIDs but drinking up to 6-12 beers per day. Admitted earlier this month with alcohol w/d c/b seizures. Report compliance with his PPI. Pain is constant, vague, gnawing and worsened with food intake. He doesn't think its his pancreas.     He states he feels like he is in mild withdrawal - feels shaky. Got ativan 2 mg x1 in ED. Lipase 32, hgb 9.9, alcohol level 110 at 6am. No utox done - last utox with THC and cocaine. CT ab/pelvis was unremarkable.      Allergies:  Penicillins and Tramadol    Medications:   Prior to Admission medications    Medication Dose, Route, Frequency   albuterol HFA 90 mcg/actuation inhaler 2 puffs, Inhalation, Every 6 hours PRN   amLODIPine (NORVASC) 10 MG tablet 10 mg, Oral, Daily (standard)   busPIRone (BUSPAR) 5 MG tablet TAKE 1 TABLET BY MOUTH THREE TIMES A DAY   diclofenac sodium (VOLTAREN) 1 % gel 2 g, Topical, 4 times a day, Any area of pain   diphenhydrAMINE (BENADRYL) 25 mg tablet 12.5 mg, Oral, Every 6 hours PRN  diphenhydramine HCl (MAGIC MOUTHWASH ORAL) suspension 10 mL, Oral, 4 times daily PRN   enzalutamide (XTANDI) 40 mg capsule 120 mg, Oral, Daily (standard)   fentaNYL (DURAGESIC) 25 mcg/hr patch 1 patch, Transdermal, Every 72 hours   omeprazole (PRILOSEC) 20 MG capsule TAKE 2 CAPSULES BY MOUTH ONCE DAILY   oxyCODONE (ROXICODONE) 5 MG immediate release tablet 10 mg, Oral, Every 4 hours PRN   pancrelipase, Lip-Prot-Amyl, (CREON) 24,000-76,000 -120,000 unit CpDR delayed release capsule 48,000 units of lipase, Oral, 3 times a day (with meals), Take an additional 1 capsule with snacks.    prochlorperazine (COMPAZINE) 10 MG tablet 10 mg, Oral, Every 8 hours PRN   salmeteroL (SEREVENT) 50 mcg/dose diskus inhaler 1 puff, Inhalation, 2 times a day (standard)   tiotropium (SPIRIVA WITH HANDIHALER) 18 mcg inhalation capsule 18 mcg, Inhalation, Daily (RT)   traZODone (DESYREL) 50 MG tablet 50 mg, Oral, Nightly PRN   valACYclovir (VALTREX) 500 MG tablet 500 mg, Oral, Daily (standard)       Medical History:  Past Medical History:   Diagnosis Date   ??? Alcohol abuse    ??? COPD (chronic obstructive pulmonary disease) (CMS-HCC)    ??? Hx of radiation therapy    ??? Hypertension    ??? Polysubstance abuse (CMS-HCC)    ??? Prostate cancer (CMS-HCC)    ??? Seizures (CMS-HCC)        Surgical History:  Past Surgical History:   Procedure Laterality Date   ??? IR INSERT PORT AGE GREATER THAN 5 YRS  07/15/2018    IR INSERT PORT AGE GREATER THAN 5 YRS 07/15/2018 Carolin Coy, MD IMG [facility]   ??? PR COLSC FLX W/RMVL OF TUMOR POLYP LESION SNARE TQ N/A 08/16/2015    Procedure: COLONOSCOPY FLEX; W/REMOV TUMOR/LES BY SNARE;  Surgeon: Gwen Pounds, MD;  Location: [facility];  Service: Gastroenterology   ??? PR COLSC FLX WITH DIRECTED SUBMUCOSAL NJX ANY SBST N/A 08/16/2015    Procedure: COLONOSCOPY, FLEXIBLE, PROXIMAL TO SPLENIC FLEXURE; WITH DIRECTED SUBMUCOSAL INJECTION(S), ANY SUBSTANCE;  Surgeon: Gwen Pounds, MD;  Location: [facility];  Service: Gastroenterology   ??? PROSTATE SURGERY         Social History:   Social History     Socioeconomic History   ??? Marital status: Single     Spouse name: Not on file   ??? Number of children: Not on file   ??? Years of education: Not on file   ??? Highest education level: Not on file   Occupational History   ??? Not on file   Social Needs   ??? Financial resource strain: Not on file   ??? Food insecurity     Worry: Not on file     Inability: Not on file   ??? Transportation needs     Medical: Not on file     Non-medical: Not on file   Tobacco Use   ??? Smoking status: Current Some Day Smoker     Packs/day: 0.20     Years: 21.00     Pack years: 4.20     Types: Cigarettes     Start date: 05/09/1994   ??? Smokeless tobacco: Never Used   ??? Tobacco comment: smoking anywhere from 0-3 cigarettes daily   Substance and Sexual Activity   ??? Alcohol use: Yes     Alcohol/week: 7.0 standard drinks     Types: 7 Cans of beer per week   ??? Drug use: Not Currently  Types: Marijuana   ??? Sexual activity: Not Currently   Lifestyle   ??? Physical activity     Days per week: Not on file     Minutes per session: Not on file   ??? Stress: Not on file   Relationships   ??? Social Wellsite geologist on phone: Not on file     Gets together: Not on file     Attends religious service: Not on file     Active member of club or organization: Not on file     Attends meetings of clubs or organizations: Not on file     Relationship status: Not on file   Other Topics Concern   ??? Do you use sunscreen? No   ??? Tanning bed use? No   ??? Are you easily burned? No   ??? Excessive sun exposure? No   ??? Blistering sunburns? No   Social History Narrative   ??? Not on file        Family History:   family history includes Diabetes in his father and mother; Heart disease in his mother.      Review of Systems:  A full 10 system review was completed and is otherwise unremarkable     Physical Exam:  Temp:  [36.7 ??C (98 ??F)] 36.7 ??C (98 ??F)  Heart Rate:  [99-113] 99  SpO2 Pulse:  [97-113] 97  Resp:  [18] 18  BP: (125-138)/(68-90) 138/90  MAP (mmHg):  [84-102] 102  SpO2:  [99 %-100 %] 99 %  BMI (Calculated):  [24.72] 24.72  Gen: chronically ill-appearing middle-aged AAM, laying in bed holding stomach, no acute distress.  HEENT: Normocephalic, atraumatic. Pupils equal, round. Extraocular movements intact. Sclerae clear. Oropharynx is clear without exudate or lesion.  Neck: Supple without lymphadenopathy or JVD.  Lungs: Clear to auscultation bilaterally. No wheezes or rhonchi  Heart: Normal rate, regular rhythm. Normal S1, S2. No murmurs, rubs, or gallops.  Abdomen: Soft, mild tenderness in epigastric region, non-distended. Bowel sounds present. No hepatosplenomegaly. Extremities: No cyanosis, clubbing, or edema. Distal pulses 2+ and equal.  Skin: No rashes, lesions, or skin breakdown.  Neuro: alert and oriented x3; CN II-XII grossly intact. Strength and sensation grossly intact  Psych: mood appropriate although somewhat withdrawn    Test Results:  Labs: I personally reviewed the lab results.  Recent Results (from the past 24 hour(s))   COVID-19 PCR    Collection Time: 06/22/19  5:28 AM   Result Value Ref Range    SARS-CoV-2 PCR Negative Negative   Comprehensive metabolic panel    Collection Time: 06/22/19  5:58 AM   Result Value Ref Range    Sodium 138 135 - 145 mmol/L    Potassium 4.1 3.5 - 5.0 mmol/L    Chloride 111 (H) 98 - 107 mmol/L    Anion Gap 10 7 - 15 mmol/L    CO2 17.0 (L) 22.0 - 30.0 mmol/L    BUN 7 7 - 21 mg/dL    Creatinine 1.61 (L) 0.70 - 1.30 mg/dL    BUN/Creatinine Ratio 10     EGFR CKD-EPI Non-African American, Male >90 >=60 mL/min/1.19m2    EGFR CKD-EPI African American, Male >90 >=60 mL/min/1.62m2    Glucose 98 70 - 179 mg/dL    Calcium 7.9 (L) 8.5 - 10.2 mg/dL    Albumin 3.4 (L) 3.5 - 5.0 g/dL    Total Protein 6.2 (L) 6.5 - 8.3 g/dL    Total Bilirubin 0.4 0.0 -  1.2 mg/dL    AST 72 (H) 19 - 55 U/L    ALT 22 <50 U/L    Alkaline Phosphatase 255 (H) 38 - 126 U/L   Lipase    Collection Time: 06/22/19  5:58 AM   Result Value Ref Range    Lipase 32 (L) 44 - 232 U/L   Ethanol,Blood    Collection Time: 06/22/19  5:58 AM   Result Value Ref Range    Alcohol, Ethyl 110.0 (H) 0.0-<10.0 mg/dL   Lactic Acid, Venous, Whole Blood    Collection Time: 06/22/19  5:58 AM   Result Value Ref Range    Lactate, Venous 2.1 (H) 0.5 - 1.8 mmol/L   Blood Gas, Venous    Collection Time: 06/22/19  5:58 AM   Result Value Ref Range    Specimen Source Venous     FIO2 Venous Room Air     pH, Venous 7.35 7.32 - 7.43    pCO2, Ven 37 (L) 40 - 60 mm Hg    pO2, Ven 47 30 - 55 mm Hg    HCO3, Ven 20 (L) 22 - 27 mmol/L    Base Excess, Ven -5.6 (L) -2.0 - 2.0    O2 Saturation, Venous 77.7 40.0 - 85.0 % Pro-BNP    Collection Time: 06/22/19  5:58 AM   Result Value Ref Range    PRO-BNP 161.0 0.0 - 177.0 pg/mL   CBC w/ Differential    Collection Time: 06/22/19  5:58 AM   Result Value Ref Range    WBC 10.0 4.5 - 11.0 10*9/L    RBC 2.95 (L) 4.50 - 5.90 10*12/L    HGB 9.9 (L) 13.5 - 17.5 g/dL    HCT 16.1 (L) 09.6 - 53.0 %    MCV 96.4 80.0 - 100.0 fL    MCH 33.7 26.0 - 34.0 pg    MCHC 34.9 31.0 - 37.0 g/dL    RDW 04.5 40.9 - 81.1 %    MPV 6.8 (L) 7.0 - 10.0 fL    Platelet 213 150 - 440 10*9/L    Neutrophils % 77.8 %    Lymphocytes % 13.2 %    Monocytes % 6.1 %    Eosinophils % 0.6 %    Basophils % 0.4 %    Absolute Neutrophils 7.8 (H) 2.0 - 7.5 10*9/L    Absolute Lymphocytes 1.3 (L) 1.5 - 5.0 10*9/L    Absolute Monocytes 0.6 0.2 - 0.8 10*9/L    Absolute Eosinophils 0.1 0.0 - 0.4 10*9/L    Absolute Basophils 0.0 0.0 - 0.1 10*9/L    Large Unstained Cells 2 0 - 4 %   Urinalysis with Culture Reflex    Collection Time: 06/22/19  6:10 AM   Result Value Ref Range    Color, UA Yellow     Clarity, UA Clear     Specific Gravity, UA 1.010 1.005 - 1.040    pH, UA 6.0 5.0 - 9.0    Leukocyte Esterase, UA Negative Negative    Nitrite, UA Negative Negative    Protein, UA Negative Negative    Glucose, UA Negative Negative    Ketones, UA Negative Negative    Urobilinogen, UA 0.2 mg/dL 0.2 - 2.0 mg/dL    Bilirubin, UA Negative Negative    Blood, UA Negative Negative    RBC, UA 3 (H) <3 /HPF    WBC, UA 8 (H) <2 /HPF    Squam Epithel, UA 6 (H) 0 - 5 /HPF  Bacteria, UA Rare (A) None Seen /HPF   ECG 12 Lead    Collection Time: 06/22/19  6:55 AM   Result Value Ref Range    EKG Systolic BP  mmHg    EKG Diastolic BP  mmHg    EKG Ventricular Rate 98 BPM    EKG Atrial Rate 98 BPM    EKG P-R Interval 156 ms    EKG QRS Duration 80 ms    EKG Q-T Interval 388 ms    EKG QTC Calculation 495 ms    EKG Calculated P Axis 57 degrees    EKG Calculated R Axis 40 degrees    EKG Calculated T Axis 40 degrees    QTC Fredericia 457 ms       I have personally reviewed and interpreted the following studies:    CXR: NSR, QTc prolonged at 495

## 2019-06-22 NOTE — Unmapped (Signed)
-----   Message from Neldon Labella, RD/LDN sent at 06/21/2019  1:38 PM EDT -----  Regarding: RE: Ensure  Heard back from the RD and she will send to him. :)  ----- Message -----  From: Ralph Dowdy, LCSW  Sent: 06/16/2019   1:56 PM EDT  To: Neldon Labella, RD/LDN  Subject: RE: Ensure                                       Thanks Melvenia Beam, I will share that with him when I talk to him.    Huntley Dec  ----- Message -----  From: Neldon Labella, RD/LDN  Sent: 06/16/2019   1:42 PM EDT  To: Ralph Dowdy, LCSW  Subject: RE: Ensure                                       Hello!!    I'm waiting to hear back from one of the RDs at the cancer center.    Melvenia Beam  ----- Message -----  From: Ralph Dowdy, LCSW  Sent: 06/14/2019   4:41 PM EDT  To: Neldon Labella, RD/LDN  Subject: Ensure                                           Hi Shari,    I spoke with pt today during HFU clinic and he mentioned you were sending him some coupons for his Ensure. Have you had a chance to mail these to pt? I am planning on following up with him later in the week to provide support with an eye exam appt, I'll be happy to share any updates that you have with him.    Thank you!    Huntley Dec

## 2019-06-22 NOTE — Unmapped (Signed)
Pt with emesis for past three days. Pt states he is unable to sleep. Pt on chemo for prostate cancer.

## 2019-06-23 LAB — CALCIUM: Calcium:MCnc:Pt:Ser/Plas:Qn:: 8.1 — ABNORMAL LOW

## 2019-06-23 LAB — CBC
HEMATOCRIT: 31.1 % — ABNORMAL LOW (ref 41.0–53.0)
HEMOGLOBIN: 10.8 g/dL — ABNORMAL LOW (ref 13.5–17.5)
MEAN CORPUSCULAR HEMOGLOBIN CONC: 34.7 g/dL (ref 31.0–37.0)
MEAN CORPUSCULAR HEMOGLOBIN: 33.1 pg (ref 26.0–34.0)
MEAN CORPUSCULAR VOLUME: 95.4 fL (ref 80.0–100.0)
MEAN PLATELET VOLUME: 9.5 fL (ref 7.0–10.0)
RED BLOOD CELL COUNT: 3.26 10*12/L — ABNORMAL LOW (ref 4.50–5.90)
RED CELL DISTRIBUTION WIDTH: 12.1 % (ref 12.0–15.0)
WBC ADJUSTED: 10.8 10*9/L (ref 4.5–11.0)

## 2019-06-23 LAB — BASIC METABOLIC PANEL
ANION GAP: 7 mmol/L (ref 7–15)
BLOOD UREA NITROGEN: 6 mg/dL — ABNORMAL LOW (ref 7–21)
CALCIUM: 8.1 mg/dL — ABNORMAL LOW (ref 8.5–10.2)
CHLORIDE: 109 mmol/L — ABNORMAL HIGH (ref 98–107)
CO2: 22 mmol/L (ref 22.0–30.0)
CREATININE: 0.62 mg/dL — ABNORMAL LOW (ref 0.70–1.30)
EGFR CKD-EPI AA MALE: >90 mL/min/{1.73_m2} (ref >=60)
EGFR CKD-EPI NON-AA MALE: >90 mL/min/{1.73_m2} (ref >=60)
GLUCOSE RANDOM: 102 mg/dL — ABNORMAL HIGH (ref 70–99)
POTASSIUM: 3.8 mmol/L (ref 3.5–5.0)
SODIUM: 138 mmol/L (ref 135–145)

## 2019-06-23 LAB — WBC ADJUSTED: Lab: 10.8

## 2019-06-23 MED ORDER — SUCRALFATE 100 MG/ML ORAL SUSPENSION
Freq: Four times a day (QID) | ORAL | 0 refills | 0 days | Status: CP
Start: 2019-06-23 — End: 2019-07-05

## 2019-06-23 MED ORDER — OMEPRAZOLE 20 MG CAPSULE,DELAYED RELEASE
ORAL_CAPSULE | Freq: Two times a day (BID) | ORAL | 2 refills | 0.00000 days | Status: CP
Start: 2019-06-23 — End: 2019-07-23

## 2019-06-23 MED ORDER — FAMOTIDINE 20 MG TABLET
ORAL_TABLET | Freq: Two times a day (BID) | ORAL | 0 refills | 0.00000 days | Status: CP | PRN
Start: 2019-06-23 — End: 2019-08-16

## 2019-06-23 MED ORDER — PROCHLORPERAZINE MALEATE 10 MG TABLET
ORAL_TABLET | Freq: Three times a day (TID) | ORAL | 1 refills | 0 days | Status: CP | PRN
Start: 2019-06-23 — End: 2019-08-13

## 2019-06-23 MED ORDER — TRAZODONE 50 MG TABLET
ORAL_TABLET | Freq: Every evening | ORAL | 3 refills | 0 days | Status: CP | PRN
Start: 2019-06-23 — End: 2019-07-23

## 2019-06-23 MED ORDER — OXYCODONE 5 MG TABLET
ORAL_TABLET | ORAL | 0 refills | 0.00000 days | Status: CP | PRN
Start: 2019-06-23 — End: 2019-07-14

## 2019-06-23 MED ORDER — MULTIVITAMIN WITH MINERALS TABLET
ORAL_TABLET | Freq: Every day | ORAL | 2 refills | 0 days | Status: CP
Start: 2019-06-23 — End: 2019-06-28

## 2019-06-23 NOTE — Unmapped (Signed)
Patient stable in NAD, no acute event during shift; has non productive cough; see assessment per flowsheet and medication per [facility]; encouraged patient to call for assistance. Assistance provided to bathroom. Will continue to monitor and follow care plan.        Problem: Adult Inpatient Plan of Care  Goal: Plan of Care Review  Outcome: Progressing  Goal: Patient-Specific Goal (Individualization)  Outcome: Progressing  Goal: Absence of Hospital-Acquired Illness or Injury  Outcome: Progressing  Goal: Optimal Comfort and Wellbeing  Outcome: Progressing  Goal: Readiness for Transition of Care  Outcome: Progressing  Goal: Rounds/Family Conference  Outcome: Progressing     Problem: Fall Injury Risk  Goal: Absence of Fall and Fall-Related Injury  Outcome: Progressing     Problem: Self-Care Deficit  Goal: Improved Ability to Complete Activities of Daily Living  Outcome: Progressing

## 2019-06-23 NOTE — Unmapped (Signed)
Pt alert and oriented. Vitals stable. Pt c/o nausea/vomiting and pain in lower back. Pharmacological interventions provided adequate relief. IVF infusing. CIWAA scoring 0-1. Will continue to monitor.     Problem: Adult Inpatient Plan of Care  Goal: Absence of Hospital-Acquired Illness or Injury  Outcome: Ongoing - Unchanged  Intervention: Identify and Manage Fall Risk  Flowsheets (Taken 06/23/2019 0554)  Safety Interventions:  ??? low bed  ??? fall reduction program maintained  ??? nonskid shoes/slippers when out of bed  Intervention: Prevent Skin Injury  Flowsheets (Taken 06/23/2019 0554)  Pressure Reduction Techniques: frequent weight shift encouraged  Intervention: Prevent VTE (venous thromboembolism)  Flowsheets (Taken 06/23/2019 0554)  VTE Prevention/Management:  ??? fluids promoted  ??? intravenous hydration  Intervention: Prevent Infection  Flowsheets (Taken 06/23/2019 0554)  Infection Prevention:  ??? rest/sleep promoted  ??? single patient room provided  ??? visitors restricted/screened  Goal: Optimal Comfort and Wellbeing  Outcome: Ongoing - Unchanged     Problem: Fall Injury Risk  Goal: Absence of Fall and Fall-Related Injury  Outcome: Ongoing - Unchanged

## 2019-06-23 NOTE — Unmapped (Signed)
Calling in regards to a PC. Please give a call back to number [phone].

## 2019-06-23 NOTE — Unmapped (Signed)
Tobacco Treatment Program  Phone Visit Note    This medical encounter was conducted virtually using [email or web address]  TeleHealth protocols.      I have identified myself to the patient and conveyed my credentials to Joseph Phillips  I have explained the capabilities and limitations of telemedicine and the patient/proxy and myself both agree that it is appropriate for their current circumstances/symptoms. The patient/proxy has given me verbal consent to proceed.     Patient's location at the time of the telephone visit: Hospitalized at [facility]   Provider's location at the time of the telephone visit: At home, in [facility] Information  Person Contacted: Joseph Phillips Phone number: [phone]       Phone Outcome: Talked ti patient  Is there someone else in the room? No.      Purpose of contact:     Joseph Phillips is a 58 y.o. male is participating in a telephone visit for assessment and plan for treatment of physical health consequences of tobacco use. Patient consented to telephone visit because of social distancing measures in place due to the COVID-19 pandemic.     NOTE:     Tobacco Use Treatment Consult     Primary Service: consult from Hospitalist Service #2     CC: The Tobacco Treatment Program (TTP) was asked to consult for   58 y.o., for evaluation of potential nicotine withdrawal in the face of admission for acute alcoholic gastritis with comorbid COPD, HTN.      HPI:     Past Medical History:   Diagnosis Date   ??? Alcohol abuse    ??? COPD (chronic obstructive pulmonary disease) (CMS-HCC)    ??? Hx of radiation therapy    ??? Hypertension    ??? Polysubstance abuse (CMS-HCC)    ??? Prostate cancer (CMS-HCC)    ??? Seizures (CMS-HCC)          Current Facility-Administered Medications:   ???  albuterol 2.5 mg /3 mL (0.083 %) nebulizer solution 2.5 mg, 2.5 mg, Nebulization, Q6H PRN, Lawernce Keas, MD  ???  aluminum-magnesium hydroxide-simethicone (MAALOX MAX) 80-80-8 mg/mL oral suspension, 30 mL, Oral, Q4H PRN, Lawernce Keas, MD  ???  amLODIPine (NORVASC) tablet 10 mg, 10 mg, Oral, Daily, Lawernce Keas, MD, 10 mg at 06/23/19 0981  ???  arformoterol (BROVANA) nebulizer solution 15 mcg/2 mL, 15 mcg, Nebulization, BID (RT), Lawernce Keas, MD, 15 mcg at 06/23/19 1914  ???  busPIRone (BUSPAR) tablet 5 mg, 5 mg, Oral, TID, Lawernce Keas, MD, 5 mg at 06/23/19 7829  ???  chlordiazePOXIDE (LIBRIUM) capsule 25 mg, 25 mg, Oral, Q8H SCH, Lawernce Keas, MD, 25 mg at 06/23/19 5621  ???  diclofenac sodium (VOLTAREN) 1 % gel 2 g, 2 g, Topical, 4x Daily, Lawernce Keas, MD, 2 g at 06/22/19 2242  ???  diphenhydrAMINE (BENADRYL) capsule/tablet 25 mg, 25 mg, Oral, Q6H PRN, Lawernce Keas, MD  ???  enoxaparin (LOVENOX) syringe 40 mg, 40 mg, Subcutaneous, Q24H, Lawernce Keas, MD, 40 mg at 06/22/19 1445  ???  famotidine (PEPCID) tablet 20 mg, 20 mg, Oral, BID, Lawernce Keas, MD, 20 mg at 06/23/19 3086  ???  fentaNYL (DURAGESIC) 25 mcg/hr 1 patch, 1 patch, Transdermal, Q72H, Lawernce Keas, MD, 1 patch at 06/23/19 0854  ???  ipratropium (ATROVENT) 0.02 % nebulizer solution  500 mcg, 500 mcg, Nebulization, Q6H (RT), Lawernce Keas, MD, 500 mcg at 06/23/19 0919  ???  lactated Ringers infusion, 100 mL/hr, Intravenous, Continuous, Lawernce Keas, MD, Last Rate: 100 mL/hr at 06/23/19 0037, 100 mL/hr at 06/23/19 0037  ???  LORazepam (ATIVAN) tablet 1 mg, 1 mg, Oral, Q4H PRN **OR** LORazepam (ATIVAN) tablet 2 mg, 2 mg, Oral, Q4H PRN **OR** LORazepam (ATIVAN) tablet 4 mg, 4 mg, Oral, Q30 Min PRN **OR** LORazepam (ATIVAN) tablet 4 mg, 4 mg, Oral, Q2H PRN, Lawernce Keas, MD  ???  magnesium citrate solution 150 mL, 150 mL, Oral, Daily PRN, Lawernce Keas, MD  ???  magnesium oxide (MAG-OX) tablet 400 mg, 400 mg, Oral, Daily, Lawernce Keas, MD, 400 mg at 06/23/19 5427  ???  melatonin tablet 3 mg, 3 mg, Oral, QPM, Lawernce Keas, MD, 3 mg at 06/22/19 1834  ???  MORPhine injection 2 mg, 2 mg, Intravenous, Q6H PRN, Lawernce Keas, MD  ???  oxyCODONE (ROXICODONE) immediate release tablet 10 mg, 10 mg, Oral, Q4H PRN, Lawernce Keas, MD, 10 mg at 06/23/19 0448  ???  pancrelipase (Lip-Prot-Amyl) (CREON) 24,000-76,000 -120,000 unit delayed release capsule 48,000 units of lipase, 48,000 units of lipase, Oral, 3xd Meals, Lawernce Keas, MD, 48,000 units of lipase at 06/23/19 0853  ???  pantoprazole (PROTONIX) injection 40 mg, 40 mg, Intravenous, BID, Lawernce Keas, MD, 40 mg at 06/23/19 0623  ???  polyethylene glycol (MIRALAX) packet 17 g, 17 g, Oral, Daily PRN, Lawernce Keas, MD  ???  prochlorperazine (COMPAZINE) tablet 10 mg, 10 mg, Oral, Q8H PRN, Lawernce Keas, MD, 10 mg at 06/23/19 7628  ???  promethazine (PHENERGAN) 12.5 mg in sodium chloride (NS) 0.9 % 25 mL infusion, 12.5 mg, Intravenous, Q6H PRN, Lawernce Keas, MD  ???  sucralfate (CARAFATE) oral suspension, 1 g, Oral, Q6H SCH, Lawernce Keas, MD, 1 g at 06/23/19 0038  ???  thiamine (B-1) injection 200 mg, 200 mg, Intravenous, Q8H SCH, Lawernce Keas, MD, 200 mg at 06/23/19 3151     Allergies   Allergen Reactions   ??? Penicillins      Other reaction(s): UNKNOWN   ??? Tramadol Dizziness        Social History     Social History Narrative   ??? Not on file       Tobacco Use Treatment  Program: Hospital Inpatient  Type of Visit: Initial     Tobacco Use Treatment Visit: Talked with patient  Permission To Engage In Conversation Re: PHI w/ Visitors Present: n/a    Tobacco Use During Past 30 Days  Time Since Last Tobacco Use: 1 to 7 days ago  Tobacco Withdrawal (Past 24 Hours): Headache, coughing, sore throat  Type of Tobacco Products Used: Cigarettes  Quantity Used: 5  Quantity Per: day  Relight Cigarettes: Yes  Time to First Use After Waking: >60 minutes  Wake During Sleep to Smoke: Never  Other Household Members Use Tobacco: No  Smoking Allowed in Home: No  Smoking Allowed in Vehicles: No  Smoke During Work Day: N/A Tobacco Use History  Age Began Use (Years Old): 18  Brand of Tobacco Used: [place]  Menthol: Yes  Longest Time Without Tobacco: Days  # of Hours, Days, Weeks, Months or Years: 3     Medications Used in Past Attempts: None          Behavioral Assessment  Why Uses: 1. smokes because stressed 2. smoking makes  me feel better  Reasons to Become Tobacco Free: 1. doesn't feel it is affecting his health  Barriers/Challenges: 1. undecided about quitting 2. smokes when stressed  Strategies: 1. puffing on cut-up drinking straws, sucking on hard candy 2. mindfulness during urges 3. making home tobacco free 4. interventional counseling      Joseph Phillips  is undecided about quitting at this time.  Declined inpatient and outpatient NRT. Briefly discussed Zyban and Chantix as alternatives which patient declined  Declined [place] [facility] referral.    Consented to automated TELASK for f/u. Patient states he is not experiencing cravings. Pt does not feel that smoking is affecting his health because he does not smoke very much. Joseph Phillips is a new patient to our service.    APP discussed health benefits of quitting smoking as improving health and increasing life expectancy. APP and pt discussed health effects of smoking and risk of coronary heart disease and stroke. APP discussed early studies suggesting that smoking is a risk factor for more severe symptoms and complications of COVID-19 including death compared to non-smokers. APP provider discussed  with patient how quitting smoking lowers his/her risk of various types of cancer.        PE:        Vitals:    06/23/19 0841   BP: 134/84   Pulse: 78   Resp: 18   Temp: 36.7 ??C (98.1 ??F)   SpO2: 98%       General: Open to conversation about tobacco use.   Resp: No Cough during phone consult. Spoke in complete sentences.   Psych: Alert and Oriented X3, Attentive to conversation. Displays appropriate mood and insight. Responds appropriately to questions.       Relevant labs / xrays: Chest x-ray on 06/22/2019 shows low lung volumes and bibasilar atelectasis. Redemonstrated focal sclerosis of the left humeral next ans scapula concerning for metastatic disease.     Assessment and Plan:     1) Evaluation of nicotine withdrawal: Denies cravings. Admits to cough during last 24 hours.      2) Toxic effects of tobacco and nicotine: Strongly advised patient to quit all forms of tobacco. APP provider discussed  with patient how quitting smoking lowers his/her risk of various types of cancer. Smoking also increasing this patient's blood pressure. APP and pt discussed health effects of smoking and risk of coronary heart disease and stroke.       3) Tobacco use disorder: We discussed behavioral strategies he can implement to help manage urges and triggers. We elicited motivation and helped pt identify related triggers, strategies, and resources. We provided pt with contact information, physical improvements related to tobacco cessation, and available resource (including outpt Tobacco Treatment Program at [facility] and [place] [facility]).    Our Recommendations:     Treatment Plan  Cessation Meds Currently Using: None  Medications Recommended During Hospitalization: Patient declines, Gum 2mg , Lozenge 2mg , Patch 14mg   Outpatient/Discharge Medications Recommended: Patient declines        Patient's Plan Post Discharge/Visit: Undecided  Follow-up Plan: Permission given, Phone follow-up scheduled           A total of 23 minutes was spent seeing patient and discussing case. Our visit included appropriate treatment planning. Patient understands potential of withdrawal risks of continued use, relationship to admitting problems and potential other health conditions, benefits of quitting and resources available. Patient expressed understanding of the treatment plan and engaged in treatment planning discussion. Please contact the TTP  via pager [phone]) or phone [phone]) or re-consult for any additional concerns re-evaluation and management of nicotine withdrawal and toxic effects of tobacco/nicotine.      [facility]il Mceachron [facility]          As part of this Telephone Visit, no in-person exam was conducted.    I spent 8 minutes on the telephone with the patient.   I spent an additional 15 minutes on pre- and post-visit activities.      Visit Format/Coding: Telephone Call (Audio Only): [employer] (Coverage unknown at this time): In charge capture section or LOS section use d 99443 (21-30 minutes)

## 2019-06-23 NOTE — Unmapped (Signed)
SW reviewed chart prior to speaking with the pt. When pt was last hospitalized, SW contacted pt's brother Joseph Phillips, [phone], [phone]), who was listed as pt's preferred contact, to introduce self and role. This time, pt was able to speak with SW and clearly articulate information needed. He confirmed that he pt lives with his 60yo mother Joseph Phillips) and another brother (Joseph Phillips) in a one story home, located at [address], [place], [place]. During previous assessment, brother mentioned that the pt has one dtr but stated that the pt has minimal contact with his dtr and that the relationship is strained. Pt informed this SW that he is independent at baseline. 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 Pt denied any other deficits. He confirmed ability to complete daily ADLs but stated that he does not drive, explaining that his brother takes the pt and pt???s mother to most of their appointments. The pt reported that his brother lives next door to the pt and described his brother as a strong, reliable support. He added that his brother is retired and able to help anytime he is needed. Per pt, the brother who lives in the home, works first shift at an [employer] and is not as readily available.     Social Work  Psychosocial Assessment    Patient Name: Joseph Phillips   Medical Record Number: [id number]   Date of Birth: 04-08-1961  Sex: Male     Referral  Referred by: Care Manager  Reason for Referral: Substance Abuse Issues  Comment: Consult ordered based on pt's h/o etoh abuse. Pt last hospitalized 6/12-6/14/20 for etoh withdrawal and seizures. He was admitted with BAC of 253. At that time, the pt informed the team that he drinks approx 4-5, 16oz beers each day. Pt now admitted with acute alcoholic gastritis without hemorrhage and the likely beginning of etoh withdrawal. The pt informed this SW that he has been drinking 3-4, 16oz beers. He stated that he does not see himself as a heavy drinker and denied any decrease in etoh consumption since his last hospitalization. The pt endorsed h/o treatment x3 and stated that he would be willing to consider outpatient treatment; however, he stated that he does not have access to the internet and could not participate in online meetings/sessions. After speaking more about treatment options, the pt stated that he does not feel SA treatment to be necessary, at this time. Per pt, he has a 10y history of sobriety but relapsed approx 1-2y ago when things started gong poorly. SW spoke with pt's brother, when the pt was last hospitalized and brother reported that he was aware of pt's etoh abuse but that the pt does not drink in front of family so no one knows the extent to which he was drinking. Brother had added that their mother found bottles of wine and beer in pt's room after he was admitted to the hospital on 06/04/19. Pt mentioned that one of the issues triggering his etoh consumption is the pain associated with his cancer. He voiced his awareness that his etoh is one of the factors that contributes to his chronic pancreatitis flares but noted that he continues to drink. Pt stated that another factor that leads to his continued etoh consumption is the loss of a brother, with whom he had been living. Brother passed away 01/27/2023. Pt noted that the pt moved  back in with his mother Joseph Phillips) following the passing of his brother.    Extended Emergency Contact Information  Primary Emergency Contact: Joseph Phillips,Joseph Phillips   [place]  Home Phone: [phone]  Mobile Phone: [phone]  Relation: Brother    Legal Next of Kin / Guardian / POA / Advance Directives    HCDM (patient stated preference): [place],Joseph Phillips - Brother - [phone]    Advance Directive (Medical Treatment)  Reason patient does not have an advance directive covering medical treatment:: Patient does not wish to complete one at this time.(Pt given information on legal NOK/surrogate decision making. Pt stated he would want brother acting as Runner, broadcasting/film/video)  Healthcare Decision Maker: HCDM documented in the HCDM/Contact Info section.Orlando PennerJonna Phillips, [phone], [phone])    Health Care Decision Maker [HCDM] (Medical & Mental Health Treatment)  Healthcare Decision Maker: HCDM documented in the HCDM/Contact Info section.Orlando PennerJonna Phillips, [phone], [phone])  Information offered on HCDM, Medical & Mental Health advance directives:: Patient given information.    Advance Directive (Mental Health Treatment)  Does patient have an advance directive covering mental health treatment?: Patient does not have advance directive covering mental health treatment.    Discharge Planning  Discharge Planning Information:   Type of Residence   Mailing Address:  [address]  [place] 05/02/1961    Medical Information   Past Medical History:   Diagnosis Date   ??? Alcohol abuse    ??? COPD (chronic obstructive pulmonary disease) (CMS-HCC)    ??? Hx of radiation therapy    ??? Hypertension    ??? Polysubstance abuse (CMS-HCC)    ??? Prostate cancer (CMS-HCC)    ??? Seizures (CMS-HCC)        Past Surgical History:   Procedure Laterality Date   ??? IR INSERT PORT AGE GREATER THAN 5 YRS  07/15/2018    IR INSERT PORT AGE GREATER THAN 5 YRS 07/15/2018 Carolin Coy, MD IMG [facility]   ??? PR COLSC FLX W/RMVL OF TUMOR POLYP LESION SNARE TQ N/A 08/16/2015    Procedure: COLONOSCOPY FLEX; W/REMOV TUMOR/LES BY SNARE;  Surgeon: Gwen Pounds, MD;  [facility];  Service: Gastroenterology   ??? PR COLSC FLX WITH DIRECTED SUBMUCOSAL NJX ANY SBST N/A 08/16/2015    Procedure: COLONOSCOPY, FLEXIBLE, PROXIMAL TO SPLENIC FLEXURE; WITH DIRECTED SUBMUCOSAL INJECTION(S), ANY SUBSTANCE;  Surgeon: Gwen Pounds, MD;  Location: [facility];  Service: Gastroenterology   ??? PROSTATE SURGERY         Family History   Problem Relation Age of Onset   ??? Heart disease Mother    ??? Diabetes Mother    ??? Diabetes Father    ??? Melanoma Neg Hx    ??? Basal cell carcinoma Neg Hx    ??? Squamous cell carcinoma Neg Hx        Financial Information   Primary Insurance: Payor: MEDICAID [place] / Plan: MEDICAID [place] ACCESS / Product Type: *No Product type* /    Secondary Insurance: None   Prescription Coverage: Medicaid   Preferred Pharmacy: [id number] - [place], [place] - [address] AT [facility]  [facility]    Barriers to taking medication: No    Transition Home   Transportation at time of discharge: Family/Friend's Private Vehicle    Anticipated changes related to Illness: none   Services in place prior to admission: N/A   Services anticipated for DC: Pt stated he is  not currently interested in SA treatment. May be interested in the future   Hemodialysis Prior to Admission: No    Readmission  Risk of Unplanned Readmission Score: UNPLANNED READMISSION SCORE: 31%  Readmitted Within the Last 30 Days? Yes  Readmission Factors include: previous discharge plan unsuccessful    Social Determinants of Health  Social Determinants of Health were addressed in provider documentation.  Please refer to patient history.    Social History  Support Systems: Family Members, Parent(Primary supports for pt are brother Joseph Phillips, h: [phone], c: [phone]) and mother Joseph Phillips, [phone]). Pt's cell: [phone])                          Military Service: No military service               Medical and Psychiatric History  Psychosocial Stressors: Coping with health challenges/recent hospitalization, Comment, Financial concerns   Comment: Pt stated that he is not currently employed. 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 Pt reported that he currently receives disability and noted that he has been receiving disability for years, explaining that the disability began 2/2 cancer dx. When pt was last hospitalized, SW reviewed notes from outpatient oncology SW on 05/27/19 Sharlet Salina, MSW). Onc SW notes indicated that the pt was struggling with the cost of ensure and other necessary resources and that he was reaching out to RD Neila Gear) to assess the pt. Pt mentioned that he has not heard from Onc SW since last hospitalization and reported that he has gotten no f/u re: ability to get Ensure. SW reviewed notes from RD through the oncology team [facility], RD/LDN) from a discussion on 6/8 where it was noted that the pt wanted coupons for ensure to be sent to the home. Pt noted that he has continued ability to get assistance with incontinence briefs through clinic in [place], explaining that he has occasional incontinence when waits too late to try to get to the bathroom. During SW's previous discussion with pt's brother, [name] had mentioned that the pt speaks about his cancer frequently and that he often voices frustration re: lack of apetite due to the lack of taste 2/2 chemo.   Psychological Issues/Information: No issues(No documented mental health condition. Pt with etoh dependence)              Chemical Dependency: Alcohol(Pt noted smoking approx. 2-3 cigarettes when he does smoke. He stated that he typically drinks 3-4, 16oz beers/day but noted that he does not feel that he abuses etoh. Did state that he felt he would benefit from SA treatment at some point.)              Outpatient Providers: Primary Care Provider, Specialist   Name / Contact #: : PCP: Dr. Heide Scales; Onc: Dr. Claude Manges; Onc social worker: Sharlet Salina, MSW; Pop health: Ferol Luz, LCSW  Legal: No legal issues      Ability to [employer] Services: No issues accessing community services(Pt with adult diapers through clinic in [place]. Enrolled in Cancer support program. Working with [employer] health leader and onc Child psychotherapist)      SW continuing to follow.    Chauncy Lean, MSW, LCSW, ACM/ Pager: [phone]/06/23/2020 10:20 AM

## 2019-06-23 NOTE — Unmapped (Addendum)
Care Management  Initial Transition Planning Assessment    CM initial assessment completed telephonically as a precautionary measure in accordance with [facility] COVID-19 Pandemic emergency response plan. Patient verbalized understanding and agreement with completing this assessment by telephone.     Indicators for social work consultation identified.  Indicators include: substance abuse  Social worker consulted for psychosocial and discharge needs assessment.    CM will continue to follow for avoidable delays and opportunities for progression of care.   06/23/2019 9:33 AM              General  Care Manager assessed the patient by : Discussion with Clinical Care team, Medical record review  Orientation Level: Oriented X4  Who provides care at home?: N/A  Reason for referral: Discharge Planning, Psychosocial/Community Resource Concerns   Type of Residence: Mailing Address:  [address]  [place] [address]  Contacts: Accompanied by: Alone   Extended Emergency Contact Information  Primary Emergency Contact: Kem,Curtis   [place]  Home Phone: [phone]  Mobile Phone: [phone]  Relation: Brother  Patient Phone Number: [phone] (home)         Medical Provider(s): Max Jeri Cos, MD  Reason for Admission: Admitting Diagnosis:  No admission diagnoses are documented for this encounter.  Past Medical History:   has a past medical history of Alcohol abuse, COPD (chronic obstructive pulmonary disease) (CMS-HCC), radiation therapy, Hypertension, Polysubstance abuse (CMS-HCC), Prostate cancer (CMS-HCC), and Seizures (CMS-HCC).  Past Surgical History:   has a past surgical history that includes Prostate surgery; pr colsc flx w/rmvl of tumor polyp lesion snare tq (N/A, 08/16/2015); pr colsc flx with directed submucosal njx any sbst (N/A, 08/16/2015); and IR Insert Port Age Greater Than 5 Years (07/15/2018).   Previous admit date: 06/04/2019    Primary Insurance- Payor: MEDICAID [place] / Plan: MEDICAID [place] ACCESS / Product Type: *No Product type* /   Secondary Insurance ??? None  Prescription Coverage ??? same as above  Preferred Pharmacy - [id number] - [place], [place] - [address] AT [facility]  [facility]    Transportation home: Private vehicle  Level of function prior to admission: Independent    Contact/Decision Maker  Extended Emergency Contact Information  Primary Emergency Contact: Gretzinger,Curtis   [place]  Home Phone: [phone]  Mobile Phone: [phone]  Relation: Brother    Legal Next of Kin / Guardian / POA / Advance Directives     HCDM (patient stated preference): Leaming,Curtis - Brother - [phone]    Advance Directive (Medical Treatment)  Reason patient does not have an advance directive covering medical treatment:: Patient does not wish to complete one at this time.  Healthcare Decision Maker: HCDM documented in the HCDM/Contact Info section.(brother [name])    Health Care Decision Maker [HCDM] (Medical & Mental Health Treatment)  Healthcare Decision Maker: HCDM documented in the HCDM/Contact Info section.(brother [name])  Information offered on HCDM, Medical & Mental Health advance directives:: Patient declined information.    Advance Directive (Mental Health Treatment)  Does patient have an advance directive covering mental health treatment?: Patient does not have advance directive covering mental health treatment.    Patient Information  Lives with: Family members, Parent(Mom, brother)  Type of Residence: Private residence     Location/Detail: [place], [place]    Support Systems: Family Members, Parent    Responsibilities/Dependents at home?: No    Home Care services in place prior to admission?: No  Equipment Currently Used at Home: none(Pt has a cane but not using it)    Currently receiving outpatient dialysis?: No    Financial Information    Need for financial assistance?: No    Social Determinants of Health  Social Determinants of Health were addressed in provider documentation.  Please refer to patient history.    Discharge Needs Assessment  Concerns to be Addressed: care coordination/care conferences, substance/tobacco abuse/use    Clinical Risk Factors: New Diagnosis    Barriers to taking medications: No    Prior overnight hospital stay or ED visit in last 90 days: Yes    Readmission Within the Last 30 Days: previous discharge plan unsuccessful    Anticipated Changes Related to Illness: none    Equipment Needed After Discharge: none    Discharge Facility/Level of Care Needs: substance abuse facility    Readmission  Risk of Unplanned Readmission Score: UNPLANNED READMISSION SCORE: 31%  Predictive Model Details           31% (High) Factors Contributing to Score   Calculated 06/23/2019 09:10 23% Number of active Rx orders is 45   McGregor Risk of Unplanned Readmission Model 12% Number of ED visits in last six months is 3     8% Diagnosis of cancer is present     7% Number of hospitalizations in last year is 2     7% Active antipsychotic Rx order is present     7% ECG/EKG order is present in last 6 months     7% Latest calcium is low (8.1 mg/dL)     6% Diagnosis of electrolyte disorder is present     Readmitted Within the Last 30 Days? (No if blank) Yes  Patient at risk for readmission?: Yes    Discharge Plan  Screen findings are: Discharge planning needs identified or anticipated (Comment).    Expected Discharge Date:      Expected Transfer from Critical Care:      Patient and/or family were provided with choice of facilities / services that are available and appropriate to meet post hospital care needs?: Other (Comment)   List choices in order highest to lowest preferred, if applicable. : Will defer to SW    Initial Assessment complete?: Yes

## 2019-06-23 NOTE — Unmapped (Signed)
[facility] Discharge Summary    Identifying Information:   Name: Joseph Phillips  DOB: 03/24/1961  MRN: [id number]      Admit date: 06/22/2019  Discharge date: 06/23/2019   Discharge to: Home  Discharge Service: Hospital Medicine  Discharge Attending Physician: Murtis Sink, MD  Primary Care Provider: Unknown Foley, MD    Discharge Diagnoses:  Principal Problem:    Acute alcoholic gastritis without hemorrhage  Active Problems:    Reflux esophagitis    Tobacco use disorder    Prostate cancer (CMS-HCC)    Essential hypertension    Anxiety    COPD (chronic obstructive pulmonary disease) (CMS-HCC)    Chronic pancreatitis (CMS-HCC)    Alcohol withdrawal syndrome without complication (CMS-HCC)  Resolved Problems:    * No resolved hospital problems. *      Hospital Course:     Joseph Phillips is a pleasant 58 y.o. y/o AAM with alcoholism c/b chronic pancreatitis, and prostate cancer who presented to [facility] with nausea, vomiting and diffuse abdominal pain due to suspected alcoholic gastritis - he was also showing signs of entering withdrawal.    He was treated with IVF, IV PPI + oral H2 blocker, antiemetic and scheduled librium. With this he did quite well and felt improved by the following morning. He reported some willingness to consider stopping alcohol consumption so was discharged on a brief librium taper to manage mild withdrawal. His PPI was increased to BID to help with his suspect alcoholic gastritis (although alcohol cessation will be the most effective treatment for this).    Pertinent follow-up issues:   Continue to encourage alcohol cessation    Procedures:  No admission procedures for hospital encounter.    Pertinent Study reports (Radiology, Lab, Micro):  Xr Chest 1 View Portable    Result Date: 06/22/2019  EXAM: XR CHEST PORTABLE DATE: 06/22/2019 5:19 AM ACCESSION: [id number] UN DICTATED: 06/22/2019 5:18 AM INTERPRETATION LOCATION: [facility] CLINICAL INDICATION: 58 years old Male with DYSPNEA  COMPARISON: Chest radiograph 06/04/2019 TECHNIQUE: Portable Chest Radiograph. FINDINGS: Low lung volumes. Bibasilar atelectasis. Right chest wall Port-A-Cath with tip terminating in the cavoatrial junction. No pleural effusion or pneumothorax. Redemonstrated focal sclerosis of the left humeral neck and scapula. Stable cardiac mediastinal silhouette.     Low lung volumes and bibasilar atelectasis. Redemonstrated focal sclerosis of the left humeral neck and scapula concerning for metastatic disease.     Ct Abdomen Pelvis W Iv Contrast Only    Result Date: 06/22/2019  EXAM: CT ABDOMEN PELVIS W CONTRAST DATE: 06/22/2019 7:40 AM ACCESSION: [id number] UN DICTATED: 06/22/2019 8:09 AM INTERPRETATION LOCATION: Main [facility] CLINICAL INDICATION: diffuse abd pain, metastatic prostate ca, h/o pancreatitis ; Abdominal pain, acute, nonlocalized  COMPARISON: CT aortic dissection 03/10/2019. CT abdomen/pelvis 06/04/2018. TECHNIQUE: A spiral CT scan of the abdomen and pelvis was obtained with IV contrast from the lung bases through the pubic symphysis. Images were reconstructed in the axial plane. Coronal and sagittal reformatted images were also provided for further evaluation. FINDINGS: LINES AND TUBES: Distal aspect of central venous catheter noted at the cavoatrial junction. LOWER THORAX: Bibasilar dependent atelectasis. Coronary artery calcifications. HEPATOBILIARY: Diffuse hepatic steatosis. No focal hepatic lesion. Scattered scattered subcentimeter calcified hepatic granulomas. Cholelithiasis. Gallbladder is filled with sludge. No biliary dilatation.  SPLEEN: Unremarkable. PANCREAS: Unremarkable. ADRENALS: Unremarkable. KIDNEYS/URETERS: Symmetric nephrograms. No hydronephrosis. Excreted contrast within the bilateral collecting systems and ureters. Bilateral renal cysts and additional bilateral subcentimeter renal hypodensities are too small to characterize. Renal vascular calcifications. BLADDER: Unremarkable. PELVIC/REPRODUCTIVE ORGANS:  Prostate is surgically absent. GI TRACT: No dilated or thick walled loops of bowel. Colonic diverticulosis. Appendix is unremarkable. PERITONEUM/RETROPERITONEUM AND MESENTERY: No free air or fluid. LYMPH NODES: No enlarged lymph nodes. VESSELS: The aorta is normal in caliber.  Moderate atherosclerotic calcification of the abdominal aorta and branch vessels. The portal venous system is patent. The hepatic veins and IVC are unremarkable. BONES AND SOFT TISSUES: Diffuse osseous sclerotic metastatic lesions throughout the axial skeleton, similar to prior CTA dated 03/10/2019. No acute osseous abnormality.     --No acute pathology within the abdomen or pelvis. --Diffuse osseous sclerotic metastases, similar to prior exam from 03/10/2019. --Additional chronic/incidental findings, as detailed above.    _____________________________________________________________________________  Discharge Day Services:  BP 134/84  - Pulse 78  - Temp 36.7 ??C (98.1 ??F)  - Resp 18  - Ht 174 cm (5' 8.5)  - Wt 74.8 kg (165 lb)  - SpO2 98%  - BMI 24.72 kg/m??     Pt seen and examined on the day of discharge and determined appropriate for discharge.    Length of Discharge: I spent less than 30 mins in the discharge of this patient.  _____________________________________________________________________________  Discharge Medications:     Your Medication List      START taking these medications    chlordiazePOXIDE 25 MG capsule  Commonly known as:  LIBRIUM  Take 1 capsule (25 mg total) by mouth Two (2) times a day for 2 days, THEN 1 capsule (25 mg total) daily for 2 days.  Start taking on:  June 26, 2019     famotidine 20 MG tablet  Commonly known as:  PEPCID  Take 1 tablet (20 mg total) by mouth two (2) times a day as needed for heartburn.     multivitamin with minerals tablet  Take 1 tablet by mouth daily.     sucralfate 100 mg/mL suspension  Commonly known as:  CARAFATE  Take 10 mL (1 g total) by mouth Every six (6) hours for 7 days.        CHANGE how you take these medications    omeprazole 20 MG capsule  Commonly known as:  PriLOSEC  Take 2 capsules (40 mg total) by mouth Two (2) times a day.  What changed:  when to take this        CONTINUE taking these medications    albuterol 90 mcg/actuation inhaler  Commonly known as:  PROVENTIL HFA;VENTOLIN HFA  Inhale 2 puffs every six (6) hours as needed for wheezing.     amLODIPine 10 MG tablet  Commonly known as:  NORVASC  Take 1 tablet (10 mg total) by mouth daily.     busPIRone 5 MG tablet  Commonly known as:  BUSPAR  TAKE 1 TABLET BY MOUTH THREE TIMES A DAY     CREON 24,000-76,000 -120,000 unit Cpdr delayed release capsule  Generic drug:  pancrelipase (Lip-Prot-Amyl)  Take 48,000 units of lipase by mouth Three (3) times a day with a meal. Take an additional 1 capsule with snacks.     diclofenac sodium 1 % gel  Commonly known as:  VOLTAREN  Apply 2 g topically Four (4) times a day. Any area of pain     diphenhydrAMINE 25 mg tablet  Commonly known as:  BENADRYL  Take 0.5 tablets (12.5 mg total) by mouth every six (6) hours as needed for itching.     fentaNYL 25 mcg/hr patch  Commonly known as:  DURAGESIC  Place 1 patch on  the skin every third day.     magic mouthwash oral suspension  Take 10 mL by mouth 4 (four) times a day as needed.     oxyCODONE 5 MG immediate release tablet  Commonly known as:  ROXICODONE  Take 2 tablets (10 mg total) by mouth every four (4) hours as needed for pain.     prochlorperazine 10 MG tablet  Commonly known as:  COMPAZINE  Take 1 tablet (10 mg total) by mouth every eight (8) hours as needed.     salmeteroL 50 mcg/dose diskus inhaler  Commonly known as:  SEREVENT  Inhale 1 puff Two (2) times a day.     SPIRIVA WITH HANDIHALER 18 mcg inhalation capsule  Generic drug:  tiotropium  Place 1 capsule (18 mcg total) into inhaler and inhale once daily.     traZODone 50 MG tablet  Commonly known as:  DESYREL  Take 1 tablet (50 mg total) by mouth nightly as needed for sleep.     valACYclovir 500 MG tablet  Commonly known as:  VALTREX  Take 1 tablet (500 mg total) by mouth daily.     XTANDI 40 mg capsule  Generic drug:  enzalutamide  Take 3 capsules (120 mg total) by mouth daily.          _____________________________________________________________________________  Pending Test Results (if blank, then none):  Pending Labs     Order Current Status    Blood Culture Preliminary result    Blood Culture Preliminary result          Most Recent Labs:  Microbiology Results (last day)     Procedure Component Value Date/Time Date/Time    Blood Culture [[id number]] Collected:  06/22/19 0559    Lab Status:  Preliminary result Specimen:  Blood from 1 Peripheral Draw Updated:  06/23/19 0630     Blood Culture, Routine No Growth at 24 hours    Blood Culture [[id number]] Collected:  06/22/19 0559    Lab Status:  Preliminary result Specimen:  Blood from CVAD Tunneled Updated:  06/23/19 0630     Blood Culture, Routine No Growth at 24 hours    Urine Culture [[id number]]  (Normal) Collected:  06/22/19 0610    Lab Status:  Final result Specimen:  Urine from Clean Catch Updated:  06/23/19 0500     Urine Culture, Comprehensive Mixed Urogenital Flora    Narrative:       Specimen Source: Clean Catch          Lab Results   Component Value Date    WBC 10.8 06/23/2019    HGB 10.8 (L) 06/23/2019    HCT 31.1 (L) 06/23/2019    PLT 189 06/23/2019       Lab Results   Component Value Date    NA 138 06/23/2019    K 3.8 06/23/2019    CL 109 (H) 06/23/2019    CO2 22.0 06/23/2019    BUN 6 (L) 06/23/2019    CREATININE 0.62 (L) 06/23/2019    CALCIUM 8.1 (L) 06/23/2019    MG 1.8 06/22/2019    PHOS 1.9 (L) 04/26/2019       Lab Results   Component Value Date    ALKPHOS 255 (H) 06/22/2019    BILITOT 0.4 06/22/2019    BILIDIR <0.10 05/27/2019    PROT 6.2 (L) 06/22/2019    ALBUMIN 3.4 (L) 06/22/2019    ALT 22 06/22/2019    AST 72 (H) 06/22/2019       Lab Results  Component Value Date    PT 12.5 06/04/2019    INR 1.09 06/04/2019    APTT 36.5 06/04/2019       _________________________________________________________________________      Discharge Instructions:      Other Instructions     Discharge instructions      Please stay away from alcohol! This will allow your stomach to heal so you don't get sick again!         Discharge instructions to patient: Call your primary care doctor and make an appointment to see them:      Within 2 weeks from the time you are discharged from the hospital             Follow Up instructions and Outpatient Referrals     Discharge instructions           Appointments which have been scheduled for you    Jun 28, 2019  9:00 AM EDT  (Arrive by 8:45 AM)  LAB ONLY with HB LAB WATER [address]  LAB PHLEB [facility]) [address]  [place] [address]  [phone]      Jun 28, 2019  9:30 AM EDT  (Arrive by 9:15 AM)  RETURN FOLLOW UP [place] with Towanda Malkin, MD  [facility]) [address]  [place] [address]  [phone]      Jun 28, 2019 10:00 AM EDT  (Arrive by 9:45 AM)  Danelle Earthly NURSE with [facility]  [facility]) [address]  [place] [address]  [phone]      Jun 29, 2019 10:30 AM EDT  (Arrive by 10:15 AM)  PHONE with Neldon Labella, RD/LDN  [facility] INTERNAL MEDICINE [facility]) [address]  [place] [address]  [phone]   Please DO NOT come to the clinic for this visit. We will call you to discuss your plan of care.

## 2019-06-23 NOTE — Unmapped (Signed)
Adult Nutrition Assessment Note    Visit Type: RN Consult  Reason for Visit: Per Admission Nutrition Screen (Adult), Have you gained or lost 10 pounds in the past 3 months?, Have you had a decrease in food intake or appetite?    ASSESSMENT:   HPI & PMH:  Per EMR, In summary, Joseph Phillips is a 58 y.o. y/o male with alcoholism who presents to [facility] with suspected alcoholic gastritis and showing signs of entering withdrawal  Principal Problem:    Acute alcoholic gastritis without hemorrhage  Active Problems:    Reflux esophagitis    Tobacco use disorder    Prostate cancer (CMS-HCC)    Essential hypertension    Anxiety    COPD (chronic obstructive pulmonary disease) (CMS-HCC)    Chronic pancreatitis (CMS-HCC)    Alcohol withdrawal syndrome without complication (CMS-HCC  Nutrition Hx:   Per EMR, presents to [facility] with 3 days n/v and abdominal pain. Pt has been on CL diet since yesterday and started on regular diet for breakfast today. Pt denied any chewing or swallowing difficulties. Pt reported weight has been up and down. Pt reported weight is on the down side - pt not able to quantify weight loss. Pt reported eating at least 50% of breakfast (25% recorded in Epic). Assisted pt with ordering lunch. Pt reports possible discharge this afternoon. Pt agreed to Ensure plus (vanilla) with lunch today, but did not want it ordered regularly.  Pt reported appetite and weight fluctuations related to his chemo.  Nutritionally Pertinent Meds:  Pepcid, Mg oxide, melatonin, pancrelipase, thiamine, oxycodone, compazine  Labs:  HGB=10.8, HCT=31.1, Cl=109, Glu=102 (06/23/19)  Abd/GI:  No BM recorded, yet  Skin:   Patient Lines/Drains/Airways Status    Active Wounds     None               Current nutrition therapy order:   Nutrition Orders          Nutrition Therapy General (Regular) starting at 07/01 0723           Anthropometric Data:  -- Height: 174 cm (5' 8.5)   -- Last recorded weight: 74.8 kg (165 lb)  -- Admission weight: 74.8 kg (165 lb)  -- IBW: 71.28 kg  -- Percent IBW: 105 %  -- BMI: Body mass index is 24.72 kg/m??.   -- Weight changes this admission:   Last 5 Recorded Weights    06/22/19 0446   Weight: 74.8 kg (165 lb)      -- Weight history PTA:   Wt Readings from Last 10 Encounters:   06/22/19 74.8 kg (165 lb)   06/04/19 73.6 kg (162 lb 4.1 oz)   05/27/19 74.9 kg (165 lb 1.6 oz)   04/26/19 73.9 kg (162 lb 14.4 oz)   03/29/19 72.9 kg (160 lb 11.2 oz)   03/10/19 72.6 kg (160 lb)   03/01/19 72.7 kg (160 lb 4.4 oz)   02/02/19 73.1 kg (161 lb 1.6 oz)   01/19/19 70.3 kg (155 lb)   12/01/18 74.3 kg (163 lb 11.2 oz)        Daily Estimated Nutrient Needs:   Energy: mifflin x 1.1-1.3=1700-2009 kcals [Per Mifflin St-Jeor Equation using admission body weight, 74.8 kg (06/23/19 0940)]  Protein: 60-75 gm [0.8-1 gm/kg using admission body weight, 74.8 kg (06/23/19 0940)]  Carbohydrate:   [no restriction]  Fluid: 1700-2009 [1 mL/kcal (maintenance)]     Nutrition Focused Physical Exam:  Nutrition Evaluation  Nutrition Designation: Normal weight (BMI 18.50 - 24.99 kg/m2) (06/23/19 0940)     DIAGNOSIS:  Malnutrition Assessment using AND/ASPEN Clinical Characteristics:    Patient does not meet AND/ASPEN criteria for malnutrition at this time (06/23/19 1226)                       Overall nutrition impression:  Review of weight records - no weight loss indicated x last 7 months (74.8kg 06/22/19 vs 74.3kg 12/01/18).   - Predicted suboptimal energy intake related to poor appetite as evidenced by po intakes <50%     GOALS:  Oral Intake:       - Patient to consume >75% of 3 meals per day.     RECOMMENDATIONS AND INTERVENTIONS:  1. Recommend to continue Regular diet, as ordered  2. Monitor po intakes    RD Follow Up Parameters:  1-2 times per week (and more frequent as indicated)

## 2019-06-23 NOTE — Unmapped (Addendum)
Pt with no falls or injuries this shift, and able to ambulate with standby assist to bathroom.  Bed low and locked, side rails up x 2, and call bell within reach.  Vitals WNL. No signs/symptoms of infection.  Pt's pain controlled with prn oxycodone with adequate relief.  Pt tolerating diet.  Will continue to monitor.    Problem: Adult Inpatient Plan of Care  Goal: Plan of Care Review  Outcome: Progressing  Goal: Patient-Specific Goal (Individualization)  Outcome: Progressing  Goal: Absence of Hospital-Acquired Illness or Injury  Outcome: Progressing  Goal: Optimal Comfort and Wellbeing  Outcome: Progressing  Goal: Readiness for Transition of Care  Outcome: Progressing  Goal: Rounds/Family Conference  Outcome: Progressing

## 2019-06-24 MED FILL — XTANDI 40 MG CAPSULE: 30 days supply | Qty: 90 | Fill #1 | Status: AC

## 2019-06-24 MED FILL — XTANDI 40 MG CAPSULE: ORAL | 30 days supply | Qty: 90 | Fill #1

## 2019-06-24 NOTE — Unmapped (Signed)
Spoke with pharmacy. The prescribed medication was from Dr. Johnnette Barrios at the hospital or Dr. Gary Fleet in [place].

## 2019-06-26 MED ORDER — CHLORDIAZEPOXIDE 25 MG CAPSULE
ORAL_CAPSULE | ORAL | 0 refills | 0 days | Status: CP
Start: 2019-06-26 — End: 2019-07-05

## 2019-06-26 NOTE — Unmapped (Signed)
Initial Visit Note    Patient Name: Joseph Phillips  Patient Age: 58 y.o.  Encounter Date: 06/28/2019    Referring Physician:     Towanda Malkin, MD  [address]  [id number] [phone]  [place], [place] [address]    Primary Care Provider:  Unknown Foley, MD    Assessment/Plan:    Reason for visit:  Prostate Cancer    Cancer Staging  Prostate cancer (CMS-HCC)  Staging form: Prostate, AJCC 7th Edition  - Clinical: Stage IV (T3a, N0, M1b, PSA: Less than 10, Gleason 7) - Unsigned      --Prostate cancer, stage IV,      -- with progression on Lupron/Zytiga     --07/14/18: Docetaxel/prednisone     --11/11/2018: Neutropenic fever, with possible UTI and oral mucositis     -- 02/03/19: [name];  Held 03/01/19 for LE swelling/cellulitis; restart 03/29/19 at 80 mg qD  --Hypokalemia  --Hypomagnesemia  -- Alcoholism      -- 06/22/19: Admitted for alcoholic gastritis  --Leg cramps and pain, exacerbated by electrolyte disturbances      --03/01/19: Skin changes in the lower extremities of uncertain etiology.  --Teeth in poor repair, awaiting dental correction  --Hypertension  --Lower extremity swelling, exacerbated at night; improved on triamterene-HCTZ  -- OSA; positive study for both central and obstructive apnea; CPAP trial recommended, 07/23/2018; deferred by the patient  --History of pancreatitis with pancreatic insufficiency; on Creon; 2/2 EtOHism      Recommendation  --Patient will increase Xtandi to 160 mg daily.  He will contact us if he is not tolerating this.  He is also been given permission to restart Compazine.  -- He received Xgeva today; he will start calcium carbonate 650 mg daily.  --Start a trial of Megace 400 mg twice daily taken with food.  Discussed potential risks of this medication  -- Will plan to see back in 1 month; will consider dose escalation of Xtandi.  -- Admonished to quit smoking, and drinking alcohol.  --The remainder of his lab tests are acceptable, no new problems arise, the patient will return in 2 months for follow-up.        Patient instructions  -- Try increasing enzalutimide back to 4 tablets daily  -- Try taking Megace liquid, 2 tsp twice daily with food.   This is to increase your appetite.  -- Start calcium carbonate 650 mg once daily.  -- If yo u have no problems, we will plan to see you in 2 months for Lupron and Xgeva, as well as clinic follow up.              I have reviewed the laboratory, pathology, and radiology reports in detail and discussed findings with the patient    History of Present Illness:     Joseph Phillips is a 58 y.o. male who is seen in consultation at the request of Lonn Georgia* for an evaluation of Prostate Cancer.   Patient returns for follow-up.  He has been tolerating Xtandi at 3 tablets (120 mg) daily.  He was admitted to the hospital recently for nausea, vomiting, stomach pains that were felt to be due to alcoholic gastritis.  He admits that he was drinking fairly heavily, and has also been smoking heavily.  He is trying to curtail or stop both.  He is occasionally smokes marijuana but uses no other drugs.  He would like something for his appetite.  He has not had a recurrence of the inflammatory  changes in his ankle with associated pain.  He has diffuse aches and pains.  Diclofenac gel is helping with these.  In addition, his brother gives him massages of his back muscles, which helps.  He is not having any difficulty passing urine.  He denies ankle swelling, shortness of breath, fevers, chills, sweats.        Oncology History:  Patient was originally diagnosed with prostate cancer in 2006.  He underwent robot-assisted prostatectomy.  He did well until May 2018, when his PSA rose to a high of about 45, and he was started on Lupron, initially with Casodex, and then with apparatus.  His PSA fell and remained low until recently when he was noted to be rising from a low of 0.82 on  01/01/18, to 3.99 on 06/29/2018.  He was instructed to hold Zytiga.  Since holding Zytiga he is noted swelling in his ankles.  The swelling comes on overnight, improves during the course the day.  He was evaluated with Doppler studies on 06/29/2018 which showed no evidence of blood clots.  He is still bothered by the swelling.  Restaging studies done on 06/04/2018 including bone scan, and CT of the chest abdomen pelvis, showed sclerotic changes throughout the axial skeleton, and involvement also of scapula, some ribs and pelvis.  The sites were new since 2016, and also probably new since the CT stone study in May 2018.  Bone scan also showed some progression in disease by report, compared to 2018.  He has developed pain in the back of his calves, for which he takes oxycodone (Tylenol and ibuprofen were ineffective).  The pain followed prolonged episodes of cramping, felt to be related to low magnesium and potassium.  About 6 months ago he had shingles in a left L2 distribution.           Prostate cancer (CMS-HCC)    12/02/2008 Biopsy     Prostate, left apex, needle biopsy  - Adenocarcinoma, Gleason combined score 6 (3+3), 2 of 2 cores involved, tumor  approximately 2 mm in greatest diameter, approximately 10% of tissue involved by  carcinoma.  - See comment    E: ??Prostate, left mid, needle biopsy  - Adenocarcinoma, Gleason combined score 6 (3+3), 1 of 2 cores involved, tumor  approximately 2 mm in greatest diameter, approximately 5% of tissue involved by  carcinoma.  - See comment    F: ??Prostate, left base, needle biopsy  - Adenocarcinoma, Gleason combined score 6 (3+3), 1 of 2 cores involved, tumor  approximately 1 mm in greatest diameter, approximately 5% of tissue involved by  carcinoma.      01/24/2009 Surgery     Prostate, robot assisted laparoscopic prostatectomy  Tumor histologic type: Prostatic adenocarcinoma  Tumor grade (Gleason system): Gleason pattern 7 (3+4)  Approximate percentage of prostate involved by tumor: 40%  Extent of tumor:  Location of tumor involvement in prostate: right and left lobes, anterior  and posterior, apex to base  Confinement/non-confinement of tumor within the prostatic capsule: ??not  confined  Location of extracapsular tumor involvement: ??left prostate toward the base  (ie. block A22)  Type of extracapsular tissue involved within tumor: ?? adipose  tissue/perineural tissue  Angiolymphatic space invasion: not identified  Seminal vesicles: negative  Vas deferens: negative  Histologic assessment of surgical margins: positive margin along a 3 mm  face in the left prostate base (block A25)    Other significant findings: High grade prostatic intraepithelial  Neoplasm;  Lymph nodes: Not applicable  AJCC Staging: pT3a pNx      08/30/2013 Initial Diagnosis     Prostate cancer (CMS-HCC)      04/30/2017 -  Chemotherapy     Lupron 22.5 mg; with bicalutamide x  month      08/01/2017 - 06/30/2018 Chemotherapy     Abiaterone 1000 mg daily; decrease to 750 mg daily at 10/2018;      10/30/2017 -  Cancer Staged     Starta: TP53, TMPSSR  -EGR fusion; no other abnormalities identified      01/14/2018 -  Cancer Staged     CT Chest:  Numerous sclerotic metastases of the thoracic spine, left scapula  and multiple ribs.  3. Coronary artery and aortic atherosclerosis (ICD10-I70.0).  4. Previously demonstrated left renal mass is not included in the  field of view.      06/04/2018 -  Cancer Staged     CT CAP:  Scattered diffuse sclerotic metastatic lesions are seen throughout the spine as well as the left scapula; some of the lesions are new since prior CT dated 04/20/2015.  - No intrathoracic metastatic disease.  Interval progression of osseous metastases throughout the spine and pelvis compared to 04/22/2017.  -- Cholelithiasis.  -- Hepatic steatosis.      06/04/2018 -  Cancer Staged     Bone scan:  Increased radiotracer uptake throughout the spine, sacrum, and left ilium, consistent with numerous metastases better evaluated on same-day CT. Unchanged metastasis to the left proximal humerus.      07/14/2018 - Chemotherapy     OP PROSTATE DOCETAXEL/PREDNISONE  Docetaxel 75 mg/m2 evry 21 days      01/07/2019 -  Cancer Staged     CTA Chest:  No evidence of pulmonary embolism.  2. Occlusion of the medial basilar segmental bronchus of the left  lower lobe, nonspecific though could be seen in the setting of  aspiration. Otherwise, no acute findings.  3. Apical predominant paraseptal emphysematous change with suspected  mild progression of smoking related fibrosis with subpleural  reticulation but without evidence of honeycombing. Emphysema  (ICD10-J43.9).  4. Progression of osseous metastatic disease since 11/2017. Detailed  above. No definitive pathologic compression fractures.  5. Hepatic steatosis. 6. Coronary artery calcifications. Aortic Atherosclerosis      01/20/2019 -  Cancer Staged     NM Bone scan: Redemonstrated multiple abnormal foci throughout the appendicular axial skeleton, with interval increase in the extent of the T10 vertebral body lesion and new lesions involving the manubrium, left fifth and seventh ribs. This is most consistent with progression of osseous metastatic disease.      02/03/2019 -  Chemotherapy     Xtandi 160 mg qD; held after 1 month due to swelling.  Restart at 80 mg qD on 03/29/19      03/10/2019 -  Cancer Staged     CTA chest:  No evidence of acute aortic intramural hematoma or dissection.  Multifocal osseous sclerotic metastatic disease, progressed when compared to prior CT 06/04/2018 and grossly similar to most recent nuclear medicine bone scan.  Mucoid impacted airways in left lower lobe with adjacent subtle ground glass opacities; likely represents sequelae of aspiration. Recommend follow up CT in 6 to 8 weeks to ensure clearing.  Other chronic/incidental findings as above, not substantially changed.         Past Medical History:   Diagnosis Date   ??? Alcohol abuse    ??? COPD (chronic obstructive pulmonary disease) (CMS-HCC)    ??? Hx  of radiation therapy    ??? Hypertension    ??? Polysubstance abuse (CMS-HCC)    ??? Prostate cancer (CMS-HCC)    ??? Seizures (CMS-HCC)       Past Surgical History:   Procedure Laterality Date   ??? IR INSERT PORT AGE GREATER THAN 5 YRS  07/15/2018    IR INSERT PORT AGE GREATER THAN 5 YRS 07/15/2018 Carolin Coy, MD [facility]   ??? PR COLSC FLX W/RMVL OF TUMOR POLYP LESION SNARE TQ N/A 08/16/2015    Procedure: COLONOSCOPY FLEX; W/REMOV TUMOR/LES BY SNARE;  Surgeon: Gwen Pounds, MD;  Location: [facility];  Service: Gastroenterology   ??? PR COLSC FLX WITH DIRECTED SUBMUCOSAL NJX ANY SBST N/A 08/16/2015    Procedure: COLONOSCOPY, FLEXIBLE, PROXIMAL TO SPLENIC FLEXURE; WITH DIRECTED SUBMUCOSAL INJECTION(S), ANY SUBSTANCE;  Surgeon: Gwen Pounds, MD;  Location: [facility];  Service: Gastroenterology   ??? PROSTATE SURGERY          Family History   Problem Relation Age of Onset   ??? Heart disease Mother    ??? Diabetes Mother    ??? Diabetes Father    ??? Melanoma Neg Hx    ??? Basal cell carcinoma Neg Hx    ??? Squamous cell carcinoma Neg Hx       Family Status   Relation Name Status   ??? Mother  (Not Specified)   ??? Father  (Not Specified)   ??? Neg Hx  (Not Specified)     Social History     Occupational History   ??? Not on file   Tobacco Use   ??? Smoking status: Current Every Day Smoker     Packs/day: 0.25     Years: 40.00     Pack years: 10.00     Types: Cigarettes     Start date: 05/09/1994   ??? Smokeless tobacco: Never Used   ??? Tobacco comment: Declined NRT and declined referral to [facility].    Substance and Sexual Activity   ??? Alcohol use: Yes     Alcohol/week: 7.0 standard drinks     Types: 7 Cans of beer per week   ??? Drug use: Not Currently     Types: Marijuana   ??? Sexual activity: Not Currently       Allergies   Allergen Reactions   ??? Penicillins      Other reaction(s): UNKNOWN   ??? Tramadol Dizziness       Current Outpatient Medications   Medication Sig Dispense Refill   ??? albuterol HFA 90 mcg/actuation inhaler Inhale 2 puffs every six (6) hours as needed for wheezing. 2 Inhaler 3   ??? amLODIPine (NORVASC) 10 MG tablet Take 1 tablet (10 mg total) by mouth daily. 30 tablet 11   ??? busPIRone (BUSPAR) 5 MG tablet TAKE 1 TABLET BY MOUTH THREE TIMES A DAY 270 tablet 3   ??? calcium carbonate 650 mg calcium (1,625 mg) tablet Take 1 tablet (650 mg of elem calcium total) by mouth daily. 30 tablet 11   ??? chlordiazePOXIDE (LIBRIUM) 25 MG capsule Take 1 capsule (25 mg total) by mouth Two (2) times a day for 2 days, THEN 1 capsule (25 mg total) daily for 2 days. 6 capsule 0   ??? diclofenac sodium (VOLTAREN) 1 % gel Apply 2 g topically Four (4) times a day. Any area of pain 200 g 5   ??? diphenhydrAMINE (BENADRYL) 25 mg tablet Take 0.5 tablets (12.5  mg total) by mouth every six (6) hours as needed for itching. (Patient not taking: Reported on 06/24/2019) 30 capsule 0   ??? diphenhydramine HCl (MAGIC MOUTHWASH ORAL) suspension Take 10 mL by mouth 4 (four) times a day as needed. 120 mL 1   ??? enzalutamide (XTANDI) 40 mg capsule Take 4 capsules (160 mg total) by mouth daily. 120 capsule 5   ??? famotidine (PEPCID) 20 MG tablet Take 1 tablet (20 mg total) by mouth two (2) times a day as needed for heartburn. (Patient not taking: Reported on 06/28/2019) 60 tablet 0   ??? fentaNYL (DURAGESIC) 25 mcg/hr patch Place 1 patch on the skin every third day. 10 patch 0   ??? megestroL (MEGACE) 400 mg/10 mL (40 mg/mL) suspension Take 10 mL (400 mg total) by mouth Two (2) times a day. 480 mL 12   ??? melatonin 3 mg Tab TK 1 T PO HS PRN     ??? MULTIVITAMIN 50 PLUS Tab TK 1 T PO D     ??? omeprazole (PRILOSEC) 20 MG capsule Take 2 capsules (40 mg total) by mouth Two (2) times a day. 120 capsule 2   ??? oxyCODONE (ROXICODONE) 5 MG immediate release tablet Take 2 tablets (10 mg total) by mouth every four (4) hours as needed for pain. 100 tablet 0   ??? pancrelipase, Lip-Prot-Amyl, (CREON) 24,000-76,000 -120,000 unit CpDR delayed release capsule Take 48,000 units of lipase by mouth Three (3) times a day with a meal. Take an additional 1 capsule with snacks.     ??? prochlorperazine (COMPAZINE) 10 MG tablet Take 1 tablet (10 mg total) by mouth every eight (8) hours as needed. 30 tablet 1   ??? salmeteroL (SEREVENT) 50 mcg/dose diskus inhaler Inhale 1 puff Two (2) times a day. 1 Inhaler 12   ??? sucralfate (CARAFATE) 100 mg/mL suspension Take 10 mL (1 g total) by mouth Every six (6) hours for 7 days. 300 mL 0   ??? tiotropium (SPIRIVA WITH HANDIHALER) 18 mcg inhalation capsule Place 1 capsule (18 mcg total) into inhaler and inhale once daily. 90 capsule 3   ??? traZODone (DESYREL) 50 MG tablet Take 1 tablet (50 mg total) by mouth nightly as needed for sleep. 90 tablet 3   ??? valACYclovir (VALTREX) 500 MG tablet Take 1 tablet (500 mg total) by mouth daily. 90 tablet 3     Current Facility-Administered Medications   Medication Dose Route Frequency Provider Last Rate Last Dose   ??? heparin, porcine (PF) 100 unit/mL injection 500 Units  500 Units Intravenous Q30 Min PRN Towanda Malkin, MD   500 Units at 06/28/19 [phone]       The following portions of the patient's history were reviewed and updated as appropriate: allergies, current medications, past family history, past medical history, past social history, past surgical history and problem list.     Review of Systems   Constitutional: Positive for fatigue. Negative for appetite change, chills, diaphoresis, fever and unexpected weight change.   HENT:   Negative for mouth sores, nosebleeds and trouble swallowing.    Eyes: Negative for eye problems.   Respiratory: Negative for chest tightness, cough, hemoptysis and shortness of breath.    Cardiovascular: Negative for chest pain and leg swelling.   Gastrointestinal: Positive for abdominal pain, diarrhea, nausea and vomiting. Negative for blood in stool and constipation.   Genitourinary: Negative for difficulty urinating and hematuria.    Musculoskeletal: Positive for back pain and myalgias. Negative for arthralgias.  Skin: Negative for rash. Neurological: Negative for extremity weakness, headaches and seizures.   Hematological: Negative for adenopathy.   Psychiatric/Behavioral: Negative for confusion.   All other systems reviewed and are negative.        Vital Signs for this encounter:  BSA: There is no height or weight on file to calculate BSA.  There were no vitals taken for this visit.     BP 138/84, HR 76, RR 22, temp 36.5 ??C, O2 sat 99%, weight 73.8 kg      Physical Exam   Constitutional: He is oriented to person, place, and time.   Thin male, in no acute distress.   HENT:   Head: Normocephalic.   Eyes: Pupils are equal, round, and reactive to light. EOM are normal.   Cardiovascular: Normal rate, regular rhythm and normal heart sounds.   Pulmonary/Chest: Effort normal and breath sounds normal.   Abdominal: Soft. Bowel sounds are normal. He exhibits no distension and no mass. There is abdominal tenderness.   Patient is tender in the right upper and midline abdomen to the patient.  No masses, guarding noted.   Musculoskeletal:         General: No tenderness.      Comments: There is trace  edema in bilateral feet and lower extremities about midway up the calf.  Skin is bound down.  No erythema, tenderness.   Lymphadenopathy:     He has no cervical adenopathy.   No adenopathy neck, supraclavicular, axillary inguinal regions.   Neurological: He is alert and oriented to person, place, and time.   Skin: No rash noted.   Psychiatric: He has a normal mood and affect. His behavior is normal.       Karnofsky/Lansky Performance Status  80, Normal activity with effort; some signs or symptoms of disease (ECOG equivalent 1)    Results:    WBC   Date Value Ref Range Status   06/28/2019 7.1 4.5 - 11.0 10*9/L Final   04/17/2012 7.6 4.5 - 11.0 x10 9th/L Final     HGB   Date Value Ref Range Status   06/28/2019 10.8 (L) 13.5 - 17.5 g/dL Final   28/41/3244 01.0 13.5 - 17.5 G/DL Final     HCT   Date Value Ref Range Status   06/28/2019 31.3 (L) 41.0 - 53.0 % Final 04/17/2012 39.2 (L) 41.0 - 53.0 % Final     Platelet   Date Value Ref Range Status   06/28/2019 216 150 - 440 10*9/L Final   04/17/2012 242 150 - 440 x10 9th/L Final     LDH   Date Value Ref Range Status   05/27/2019 763 (H) 338 - 610 U/L Final     Creatinine Whole Blood, POC   Date Value Ref Range Status   07/25/2014 1.6 (H) 0.8 - 1.4 mg/dL Final     Creatinine, Whole Blood   Date Value Ref Range Status   07/25/2014 1.4 0.8 - 1.4 mg/dL Final     Creatinine   Date Value Ref Range Status   06/28/2019 0.78 0.70 - 1.30 mg/dL Final   27/25/3664 4.03 0.70 - 1.30 mg/dL Final     AST   Date Value Ref Range Status   06/28/2019 52 19 - 55 U/L Final   09/11/2011 38 19 - 55 U/L Final     PSA Diagnostic   Date Value Ref Range Status   09/28/2014 4.4 (H) 0.0 - 4.0 ng/mL Final   05/30/2014 5.0 (H) 0.0 -  4.0 ng/mL Final   03/23/2014 4.6 (H) 0.0 - 4.0 ng/mL Final   10/04/2013 3.7 0.0 - 4.0 ng/mL Final   06/24/2013 2.6 0.0 - 4.0 ng/mL Final   01/18/2013 1.6 0.0 - 4.0 NG/ML Final   09/28/2012 1.5 0.0 - 4.0 NG/ML Final   07/03/2012 1.0 0.0 - 4.0 NG/ML Final   12/30/2011 0.5 0.0 - 4.0 NG/ML Final   07/15/2011 0.3 0.0 - 4.0 NG/ML Final   04/08/2011 0.3 0.0 - 4.0 NG/ML Final

## 2019-06-28 ENCOUNTER — Ambulatory Visit: Admit: 2019-06-28 | Discharge: 2019-06-28 | Payer: MEDICAID

## 2019-06-28 ENCOUNTER — Institutional Professional Consult (permissible substitution): Admit: 2019-06-28 | Discharge: 2019-06-28 | Payer: MEDICAID

## 2019-06-28 ENCOUNTER — Ambulatory Visit
Admit: 2019-06-28 | Discharge: 2019-06-28 | Payer: MEDICAID | Attending: Hematology & Oncology | Primary: Hematology & Oncology

## 2019-06-28 DIAGNOSIS — C61 Malignant neoplasm of prostate: Principal | ICD-10-CM

## 2019-06-28 LAB — CBC W/ AUTO DIFF
BASOPHILS RELATIVE PERCENT: 1 %
EOSINOPHILS ABSOLUTE COUNT: 0.1 10*9/L (ref 0.0–0.4)
EOSINOPHILS RELATIVE PERCENT: 1.9 %
HEMATOCRIT: 31.3 % — ABNORMAL LOW (ref 41.0–53.0)
HEMOGLOBIN: 10.8 g/dL — ABNORMAL LOW (ref 13.5–17.5)
LARGE UNSTAINED CELLS: 3 % (ref 0–4)
LYMPHOCYTES ABSOLUTE COUNT: 1.8 10*9/L (ref 1.5–5.0)
LYMPHOCYTES RELATIVE PERCENT: 24.7 %
MEAN CORPUSCULAR HEMOGLOBIN CONC: 34.5 g/dL (ref 31.0–37.0)
MEAN CORPUSCULAR HEMOGLOBIN: 33.9 pg (ref 26.0–34.0)
MEAN CORPUSCULAR VOLUME: 98.3 fL (ref 80.0–100.0)
MEAN PLATELET VOLUME: 7.4 fL (ref 7.0–10.0)
MONOCYTES ABSOLUTE COUNT: 0.7 10*9/L (ref 0.2–0.8)
NEUTROPHILS ABSOLUTE COUNT: 4.2 10*9/L (ref 2.0–7.5)
NEUTROPHILS RELATIVE PERCENT: 59.5 %
PLATELET COUNT: 216 10*9/L (ref 150–440)
RED CELL DISTRIBUTION WIDTH: 13.8 % (ref 12.0–15.0)
WBC ADJUSTED: 7.1 10*9/L (ref 4.5–11.0)

## 2019-06-28 LAB — HEPATIC FUNCTION PANEL
ALBUMIN: 3.6 g/dL (ref 3.5–5.0)
ALKALINE PHOSPHATASE: 427 U/L — ABNORMAL HIGH (ref 38–126)
ALT (SGPT): 19 U/L (ref <50)
AST (SGOT): 52 U/L (ref 19–55)
BILIRUBIN TOTAL: 0.3 mg/dL (ref 0.0–1.2)

## 2019-06-28 LAB — CALCIUM: Calcium:MCnc:Pt:Ser/Plas:Qn:: 9

## 2019-06-28 LAB — EGFR CKD-EPI NON-AA MALE: Lab: >90

## 2019-06-28 LAB — ALBUMIN: Albumin:MCnc:Pt:Ser/Plas:Qn:: 3.6

## 2019-06-28 LAB — PROTEIN TOTAL: Protein:MCnc:Pt:Ser/Plas:Qn:: 6.7

## 2019-06-28 LAB — PHOSPHORUS: Phosphate:MCnc:Pt:Ser/Plas:Qn:: 2.4 — ABNORMAL LOW

## 2019-06-28 LAB — CREATININE: CREATININE: 0.78 mg/dL (ref 0.70–1.30)

## 2019-06-28 LAB — NEUTROPHILS ABSOLUTE COUNT: Lab: 4.2

## 2019-06-28 LAB — PROSTATE SPECIFIC ANTIGEN: Prostate specific Ag:MCnc:Pt:Ser/Plas:Qn:: 117 — ABNORMAL HIGH

## 2019-06-28 MED ORDER — CALCIUM CARBONATE 650 MG CALCIUM (1,625 MG) TABLET
ORAL_TABLET | Freq: Every day | ORAL | 11 refills | 0.00000 days | Status: CP
Start: 2019-06-28 — End: 2020-06-27

## 2019-06-28 MED ORDER — MEGESTROL 400 MG/10 ML (40 MG/ML) ORAL SUSPENSION
Freq: Two times a day (BID) | ORAL | 12 refills | 0 days | Status: CP
Start: 2019-06-28 — End: 2019-08-16

## 2019-06-28 MED ORDER — ENZALUTAMIDE 40 MG CAPSULE
ORAL_CAPSULE | Freq: Every day | ORAL | 5 refills | 30 days | Status: CP
Start: 2019-06-28 — End: ?
  Filled 2019-07-01: qty 120, 30d supply, fill #0

## 2019-06-28 NOTE — Unmapped (Signed)
[facility] Clinical Assessment & Refill Coordination Note    Joseph Phillips, DOB: 05-Apr-1961  Phone: [phone] (home)     All above HIPAA information was verified with patient.     Specialty Medication(s):   Hematology/Oncology: [name]     Current Outpatient Medications   Medication Sig Dispense Refill   ??? albuterol HFA 90 mcg/actuation inhaler Inhale 2 puffs every six (6) hours as needed for wheezing. 2 Inhaler 3   ??? amLODIPine (NORVASC) 10 MG tablet Take 1 tablet (10 mg total) by mouth daily. 30 tablet 11   ??? busPIRone (BUSPAR) 5 MG tablet TAKE 1 TABLET BY MOUTH THREE TIMES A DAY 270 tablet 3   ??? calcium carbonate 650 mg calcium (1,625 mg) tablet Take 1 tablet (650 mg of elem calcium total) by mouth daily. 30 tablet 11   ??? chlordiazePOXIDE (LIBRIUM) 25 MG capsule Take 1 capsule (25 mg total) by mouth Two (2) times a day for 2 days, THEN 1 capsule (25 mg total) daily for 2 days. 6 capsule 0   ??? diclofenac sodium (VOLTAREN) 1 % gel Apply 2 g topically Four (4) times a day. Any area of pain 200 g 5   ??? diphenhydrAMINE (BENADRYL) 25 mg tablet Take 0.5 tablets (12.5 mg total) by mouth every six (6) hours as needed for itching. (Patient not taking: Reported on 06/24/2019) 30 capsule 0   ??? diphenhydramine HCl (MAGIC MOUTHWASH ORAL) suspension Take 10 mL by mouth 4 (four) times a day as needed. 120 mL 1   ??? enzalutamide (XTANDI) 40 mg capsule Take 4 capsules (160 mg total) by mouth daily. 120 capsule 5   ??? famotidine (PEPCID) 20 MG tablet Take 1 tablet (20 mg total) by mouth two (2) times a day as needed for heartburn. (Patient not taking: Reported on 06/28/2019) 60 tablet 0   ??? fentaNYL (DURAGESIC) 25 mcg/hr patch Place 1 patch on the skin every third day. 10 patch 0   ??? megestroL (MEGACE) 400 mg/10 mL (40 mg/mL) suspension Take 10 mL (400 mg total) by mouth Two (2) times a day. 480 mL 12   ??? melatonin 3 mg Tab TK 1 T PO HS PRN     ??? MULTIVITAMIN 50 PLUS Tab TK 1 T PO D     ??? omeprazole (PRILOSEC) 20 MG capsule Take 2 capsules (40 mg total) by mouth Two (2) times a day. 120 capsule 2   ??? oxyCODONE (ROXICODONE) 5 MG immediate release tablet Take 2 tablets (10 mg total) by mouth every four (4) hours as needed for pain. 100 tablet 0   ??? pancrelipase, Lip-Prot-Amyl, (CREON) 24,000-76,000 -120,000 unit CpDR delayed release capsule Take 48,000 units of lipase by mouth Three (3) times a day with a meal. Take an additional 1 capsule with snacks.     ??? prochlorperazine (COMPAZINE) 10 MG tablet Take 1 tablet (10 mg total) by mouth every eight (8) hours as needed. 30 tablet 1   ??? salmeteroL (SEREVENT) 50 mcg/dose diskus inhaler Inhale 1 puff Two (2) times a day. 1 Inhaler 12   ??? sucralfate (CARAFATE) 100 mg/mL suspension Take 10 mL (1 g total) by mouth Every six (6) hours for 7 days. 300 mL 0   ??? tiotropium (SPIRIVA WITH HANDIHALER) 18 mcg inhalation capsule Place 1 capsule (18 mcg total) into inhaler and inhale once daily. 90 capsule 3   ??? traZODone (DESYREL) 50 MG tablet Take 1 tablet (50 mg total) by mouth nightly as  needed for sleep. 90 tablet 3   ??? valACYclovir (VALTREX) 500 MG tablet Take 1 tablet (500 mg total) by mouth daily. 90 tablet 3     No current facility-administered medications for this visit.         Changes to medications: [name] reports no changes at this time.    Allergies   Allergen Reactions   ??? Penicillins      Other reaction(s): UNKNOWN   ??? Tramadol Dizziness       Changes to allergies: No    SPECIALTY MEDICATION ADHERENCE     Xtandi 40 mg: 5 days of medicine on hand     Medication Adherence    Support network for adherence:  family member          Specialty medication(s) dose(s) confirmed: Now increased back to 4 capsules daily     Are there any concerns with adherence? No    Adherence counseling provided? Not needed    CLINICAL MANAGEMENT AND INTERVENTION      Clinical Benefit Assessment:    Do you feel the medicine is effective or helping your condition? Yes    Clinical Benefit counseling provided? Progress note from 06/28/19 shows evidence of clinical benefit    Adverse Effects Assessment:    Are you experiencing any side effects? No    Are you experiencing difficulty administering your medicine? No    Quality of Life Assessment:    How many days over the past month did your prostate cancer  keep you from your normal activities? For example, brushing your teeth or getting up in the morning. Patient declined to answer    Have you discussed this with your provider? Yes    Therapy Appropriateness:    Is therapy appropriate? Yes, therapy is appropriate and should be continued    DISEASE/MEDICATION-SPECIFIC INFORMATION      N/A    PATIENT SPECIFIC NEEDS     ? Does the patient have any physical, cognitive, or cultural barriers? No    ? Is the patient high risk? No     ? Does the patient require a Care Management Plan? No     ? Does the patient require physician intervention or other additional services (i.e. nutrition, smoking cessation, social work)? No      SHIPPING     Specialty Medication(s) to be Shipped:   Hematology/Oncology: [name]    Other medication(s) to be shipped: none     Changes to insurance: No    Delivery Scheduled: Yes, Expected medication delivery date: 07/01/19.     Medication will be delivered via Same Day Courier to the confirmed home address in [facility].    The patient will receive a drug information handout for each medication shipped and additional FDA Medication Guides as required.  Verified that patient has previously received a Conservation officer, historic buildings.    All of the patient's questions and concerns have been addressed.    Breck Coons [facility] Specialty Pharmacist

## 2019-06-28 NOTE — Unmapped (Addendum)
-- Try increasing enzalutimide back to 4 tablets daily  -- Try taking Megace liquid, 2 tsp twice daily with food.   This is to increase your appetite.  -- Start calcium carbonate 650 mg once daily.  -- If yo u have no problems, we will plan to see you in 2 months for Lupron and Xgeva, as well as clinic follow up.  Patient Education        megestrol  Pronunciation:  meh JESS trol  Brand:  Megace ES  What is the most important information I should know about megestrol?  Do not use if you are pregnant. Use effective birth control, and tell your doctor if you become pregnant during treatment.  What is megestrol?  Megestrol is used to treat loss of appetite and wasting syndrome in people with acquired immunodeficiency syndrome (AIDS).  Megestrol is not for use in preventing weight loss or wasting syndrome.  Megestrol may also be used for purposes not listed in this medication guide.  What should I discuss with my healthcare provider before taking megestrol?  You should not use megestrol if you are allergic to it, or if you are pregnant.  Tell your doctor if you have ever had:  ?? diabetes;  ?? an adrenal gland disorder; or  ?? a stroke or blood clot.  You may need to have a negative pregnancy test before starting this treatment.  Do not use megestrol if you are pregnant. Use effective birth control to prevent pregnancy while you are using this medicine. Tell your doctor if you become pregnant.  In animal studies, megestrol caused low birth weight and other problems when used during pregnancy. However, it is not known whether these effects would occur in humans. Ask your doctor about your risk.  Women with HIV or AIDS should not breastfeed a baby. Even if your baby is born without HIV, the virus may be passed to the baby in your breast milk.  How should I take megestrol?  Follow all directions on your prescription label and read all medication guides or instruction sheets. Use the medicine exactly as directed.  Shake the oral suspension (liquid) before you measure a dose. Use the dosing syringe provided, or use a medicine dose-measuring device (not a kitchen spoon).  Your dose needs may change if you switch to a different brand, strength, or form of this medicine.  Avoid medication errors by using only the form and strength your doctor prescribes.  Your dosage needs may also change if you have surgery, are ill, are under stress, or have an infection. Do not change your medication dose or schedule without your doctor's advice.  Store at room temperature away from moisture and heat.  When you stop using megestrol after long-term use, you may have withdrawal symptoms such as nausea, vomiting, dizziness, or weakness. Ask your doctor how to safely stop using this medicine.  What happens if I miss a dose?  Take the medicine as soon as you can, but skip the missed dose if it is almost time for your next dose. Do not take two doses at one time.  What happens if I overdose?  Seek emergency medical attention or call the Poison Help line at [phone].  What should I avoid while taking megestrol?  Follow your doctor's instructions about any restrictions on food, beverages, or activity.  What are the possible side effects of megestrol?  Get emergency medical help if you have signs of an allergic reaction:  hives; difficult breathing; swelling  of your face, lips, tongue, or throat.  Call your doctor at once if you have any of these side effects during or after your treatment with megestrol:  ?? chest pain, sudden cough, wheezing, rapid breathing, coughing up blood;  ?? swelling, warmth, or redness in an arm or leg;  ?? increased thirst, increased urination, dry mouth, fruity breath odor;  ?? weight gain (especially in your waist and upper back);  ?? muscle weakness, tiredness, feeling light-headed;  ?? skin discoloration, thinning skin, increased body hair; or  ?? mood changes, menstrual changes, sexual changes.  Common side effects may include:  ?? nausea, gas, diarrhea;  ?? increased blood pressure;  ?? impotence, sexual problems;  ?? rash; or  ?? weakness.  This is not a complete list of side effects and others may occur. Call your doctor for medical advice about side effects. You may report side effects to FDA at 1-800-FDA-1088.  What other drugs will affect megestrol?  Tell your doctor about all your other medicines, especially:  ?? a blood thinner --warfarin, Coumadin, Jantoven.  This list is not complete. Other drugs may affect megestrol, including prescription and over-the-counter medicines, vitamins, and herbal products. Not all possible drug interactions are listed here.  Where can I get more information?  Your pharmacist can provide more information about megestrol.  Remember, keep this and all other medicines out of the reach of children, never share your medicines with others, and use this medication only for the indication prescribed.   Every effort has been made to ensure that the information provided by [employer] ('[employer]') is accurate, up-to-date, and complete, but no guarantee is made to that effect. Drug information contained herein may be time sensitive. [employer] information has been compiled for use by healthcare practitioners and consumers in the [place] and therefore [employer] does not warrant that uses outside of the [place] are appropriate, unless specifically indicated otherwise. Mult[employer]'s drug information does not endorse drugs, diagnose patients or recommend therapy. Mult[employer]'s drug information is an Investment banker, corporate to assist licensed healthcare practitioners in caring for their patients and/or to serve consumers viewing this service as a supplement to, and not a substitute for, the expertise, skill, knowledge and judgment of healthcare practitioners. The absence of a warning for a given drug or drug combination in no way should be construed to indicate that the drug or drug combination is safe, effective or appropriate for any given patient. Mult[employer] does not assume any responsibility for any aspect of healthcare administered with the aid of information Mult[employer] provides. The information contained herein is not intended to cover all possible uses, directions, precautions, warnings, drug interactions, allergic reactions, or adverse effects. If you have questions about the drugs you are taking, check with your doctor, nurse or pharmacist.  Copyright [phone] [employer] Version: 7.01. Revision date: 08/21/2018.  Care instructions adapted under license by [facility]. If you have questions about a medical condition or this instruction, always ask your healthcare professional. [employer], Incorporated disclaims any warranty or liability for your use of this information.

## 2019-06-28 NOTE — Unmapped (Signed)
Addended by: Helayne Seminole R on: 06/28/2019 12:00 PM     Modules accepted: Orders

## 2019-06-28 NOTE — Unmapped (Signed)
Change in [place] dosage increase. Co-pay $3.00.

## 2019-06-28 NOTE — Unmapped (Addendum)
Accessed patient's port with a 20g 3/4 inch needle.  Obtained labs and flushed port with 20ml of normal saline and 5ml of heparin. Patient's port was deaccessed prior to leaving clinic.  Patient received Xgeva 120mg  today SQ in left arm.  Calcium 9.0  & Phos 2.4 and per theapy plan ok to give Xgeva. Patient tolerated injection without complications and ambulated to checkout without any assistance.

## 2019-06-29 ENCOUNTER — Institutional Professional Consult (permissible substitution): Admit: 2019-06-29 | Discharge: 2019-06-30 | Payer: MEDICAID | Attending: Registered" | Primary: Registered"

## 2019-06-29 DIAGNOSIS — C61 Malignant neoplasm of prostate: Principal | ICD-10-CM

## 2019-06-29 NOTE — Unmapped (Signed)
Patient Stated Health/Nutrition Goals:  Meet nutritional needs   Reduce long-term health risk    Identified treatment goals: Not applicable    Nutrition Interventions (ADVISE/ASSIST):  1. When you first get up have a few crackers and a small glass of Ginger Ale  2. Continue to reduce alcohol: preferably none at all  3. Try to have small snacks throughout the day:   A. Peanut butter on crackers   B. Tuna/chicken salad on crackers   C. Cheese toast   D. Canned fruit   E. Vegetables

## 2019-06-29 NOTE — Unmapped (Signed)
Enhanced Care Nutrition-Follow Up Phone Assessment    Nutrition Assessment:  Patient apparently had two hospitalizations since last visit. He went on a drinking binge. He's cut out the alcohol again, encouraged this. Reviewed the last recommendations again with patient.    Nutrition Diagnosis:  Undesirable food choices related to poor appetite as evidenced by skipping meals, eating highly processed foods    Patient Stated Health/Nutrition Goals:  Meet nutritional needs   Reduce long-term health risk    Identified treatment goals: Not applicable    Nutrition Interventions (ADVISE/ASSIST):  1. When you first get up have a few crackers and a small glass of Ginger Ale  2. Continue to reduce alcohol: preferably none at all  3. Try to have small snacks throughout the day:   A. Peanut butter on crackers   B. Tuna/chicken salad on crackers   C. Cheese toast   D. Canned fruit   E. Vegetables    ______________________________________________________________________  Joseph Phillips is a 58 y.o. male seen for medical nutrition therapy.    Referring MD or Clinic:   Self, Referred  Max Jeri Cos, MD    Reason for Referral:   1. Prostate cancer (CMS-HCC)        Medical History:  Past Medical History:   Diagnosis Date   ??? Alcohol abuse    ??? COPD (chronic obstructive pulmonary disease) (CMS-HCC)    ??? Hx of radiation therapy    ??? Hypertension    ??? Polysubstance abuse (CMS-HCC)    ??? Prostate cancer (CMS-HCC)    ??? Seizures (CMS-HCC)        Anthropometrics:    Present Weight   Current BMI     Goal Weight   Current Current IBW     Present Height         Weight History:  Wt Readings from Last 6 Encounters:   06/28/19 73.8 kg (162 lb 12.8 oz)   06/22/19 74.8 kg (165 lb)   06/04/19 73.6 kg (162 lb 4.1 oz)   05/27/19 74.9 kg (165 lb 1.6 oz)   04/26/19 73.9 kg (162 lb 14.4 oz)   03/29/19 72.9 kg (160 lb 11.2 oz)       Medications, Herbs, Supplements:  Reviewed nutritionally relevant medications and supplements.    Recent Labs:   Hemoglobin A1C (%)   Date Value   03/23/2014 4.8   04/17/2012 5.1      No components found for: LDLCALC   BP Readings from Last 3 Encounters:   06/28/19 138/84   06/23/19 134/84   06/06/19 128/105     Lab Results   Component Value Date    CHOL 98 (L) 09/26/2018    CHOL 125 03/23/2014    CHOL 129 09/11/2011     Lab Results   Component Value Date    HDL 36 (L) 09/26/2018    HDL 44 03/23/2014    HDL 45 09/11/2011     Lab Results   Component Value Date    LDL 33 (L) 09/26/2018    LDL <30 03/23/2014    LDL 62 09/11/2011     Lab Results   Component Value Date    VLDL 29.4 09/26/2018     Lab Results   Component Value Date    CHOLHDLRATIO 2.7 09/26/2018     Lab Results   Component Value Date    TRIG 147 09/26/2018    TRIG 142 09/11/2011       Physical Activity:  Tries to walk  a little    Dietary Restrictions, Food Intolerances:  Bread-feels like it gets stuck    Other GI Issues:  Nausea in the morning    Allergies:   Allergies   Allergen Reactions   ??? Penicillins      Other reaction(s): UNKNOWN   ??? Tramadol Dizziness       24-Hour recall/usual intake:  1st 1/2 sausage/egg/cheese biscuit, Ensure  2nd Ensure, 1/2 cheeseburger  3rd Skipped  Snacks Unable to remember    Other Usual Intake:  Usual Foods: Burgers, Sandwiches  Beverages: Ginger Ale, water, Ensure  Eating out: Not often  Cooking: Mostly heat and serve by mom/patient  Grocery shopping: Self and older brother  Grocery shops at: [employer] benefits: Food Stamps  Working Emergency planning/management officer, Engineer, maintenance (IT) available? Yes    Behavioral Risk Factors:  Sleep- N/A  Fast Eating  N/A  Overeating  N/A    Hunger and Satiety Issues:  Appetite: Poor    Patient Questions:  None    Estimated Needs:  Estimated Energy Needs: 1800-2100 kcal/day    Estimated Protein Needs: 65-75 gm/day    Materials Provided To Meet their identified goals:  List of recommendations    Food labels discussed: Yes    Expected Compliance/Barriers are:   Comprehension of plan fair  Readiness for change fair  Ability to meet goals fair    We assessed family/social/cultural characteristics and these were relevant to care: none evident    Patient has auditory or visual communication barrier or need: no    Interventions Codes:  General/healthful diet     Follow-up (ARRANGE):  6 weeks      Length of visit was 22 minuntes. Patient understands diagnosis and treatment plan. Patient voices understanding. Plan is consistent with the patient???s preferences and goals.    This visit is conducted via telephone conferencing.    Contact Information  Person Contacted: Patient  Contact Phone number: [phone] (home)   Is there someone else in the room? No.     The patient was physically located in [place] or a state in which I am permitted to provide care. The patient and/or parent/gauardian understood that s/he may incur co-pays and cost sharing, and agreed to the telemedicine visit. The visit was completed via phone and/or video, which was appropriate and reasonable under the circumstances given the patient's presentation at the time.    The patient and/or parent/guardian has been advised of the potential risks and limitations of this mode of treatment (including, but not limited to, the absence of in-person examination) and has agreed to be treated using telemedicine. The patient's/patient's family's questions regarding telemedicine have been answered.     If the phone/video visit was completed in an ambulatory setting, the patient and/or parent/guardian has also been advised to contact their provider???s office for worsening conditions, and seek emergency medical treatment and/or call 911 if the patient deems either necessary.

## 2019-06-30 NOTE — Unmapped (Signed)
PA Approved through 07/30/2019.

## 2019-06-30 NOTE — Unmapped (Signed)
Prior authorization started on 06/30/2019 with/ from [facility], [id number], via website.    Medication: Diclofenac Gel     Reference ID: [id number] W     Status: waiting for plan determination; determination expected within 72 hours via website.    Pharmacy Notified: no.    Patient Notified: no.    Provider Notified: no.    Clinical Information: Awaiting Determination. N.Raines RMA.

## 2019-07-01 MED FILL — XTANDI 40 MG CAPSULE: 30 days supply | Qty: 120 | Fill #0 | Status: AC

## 2019-07-01 NOTE — Unmapped (Signed)
This medication will need a PA b4 being able to refill.( Voltaren Gel)

## 2019-07-05 ENCOUNTER — Institutional Professional Consult (permissible substitution): Admit: 2019-07-05 | Discharge: 2019-07-06 | Payer: MEDICAID | Attending: Internal Medicine | Primary: Internal Medicine

## 2019-07-05 DIAGNOSIS — Z09 Encounter for follow-up examination after completed treatment for conditions other than malignant neoplasm: Secondary | ICD-10-CM

## 2019-07-05 DIAGNOSIS — C61 Malignant neoplasm of prostate: Secondary | ICD-10-CM

## 2019-07-05 DIAGNOSIS — F10230 Alcohol dependence with withdrawal, uncomplicated: Principal | ICD-10-CM

## 2019-07-05 DIAGNOSIS — K2920 Alcoholic gastritis without bleeding: Secondary | ICD-10-CM

## 2019-07-05 DIAGNOSIS — J449 Chronic obstructive pulmonary disease, unspecified: Secondary | ICD-10-CM

## 2019-07-05 NOTE — Unmapped (Addendum)
CARE MANAGEMENT ENCOUNTER    LCSW received notification from [facility] Provider, Dr. Barnetta Chapel, via Epic Chat, that pt was contemplating Inpatient Rehab.     LCSW called [facility] [phone]) and was informed by Janey Greaser that they have a process in place.    Process by [facility] if pt is interested in Inpatient Rehabilitation:    1) Pt needs to call [facility] and they would complete a screening over the phone,    2) then [facility] would help pt schedule a Substance Use assessment with a clinician,     3) after assessment the clinician would determine best next steps for pt, including helping link with inpatient rehab if this is their recommendation.    LCSW informed MD of process and also completed call with pt to share it with him as well. Pt reported he is not ready for the next step, but was just wondering how many days he would be expected to stay in Inpt Rehab. LCSW informed pt this would be determined by clinician who would complete his assessment. LCSW offered pt the contact information for [facility] [phone]) and he declined. He reported that when he is ready for this step, he will call LCSW to obtain their ph#.    Time Spent: 10 minutes    Jennell Corner, MSW, LCSW  [facility] Manager- [facility] Internal Medicine  Phone: [phone]  Pager: [phone]

## 2019-07-05 NOTE — Unmapped (Signed)
.  Previsit complete    Time spent on phone:     Reason for visit: Hospital Follow-up    Questions / Concerns that need to be addressed:    General Consent to Treat (GCT) for non-epic video visits only: Verbal consent    Vitals signs, if any:       Diabetes:  ? Regularly checking blood sugars?: no  o If yes, when? Complete log  Date Before Breakfast After Breakfast Before Lunch After Lunch Before Dinner After Dinner Before Bed                                                                                                                                 Hypertension:  ? Have blood pressure cuff at home?: no  ? Regularly checking blood pressure?: no  If yes, complete log  Date Time BP Pulse                                                                           Weight:    Date          Allergies reviewed: Yes    Medication reviewed: Yes    Refills needed, if any:     Travel Screening: not applicable    Falls Risk: completed    Hark (DV) Screening: completed    HCDM reviewed and updated in Epic:  Completed      HARK Screening  HARK Screening  Within the last year, have you been humiliated or emotionally abused in other ways by your partner or ex-partner?: No  Within the last year, have you been afraid of your partner or ex-partner?: No  Within the last year, have you been raped or forced to have any kind of sexual activity by your partner or ex-partner?: No  Within the last year, have you been kicked, hit, slapped, or otherwise physically hurt by your partner or ex-partner?: No    AUDIT       PHQ2       PHQ9  Thoughts that you would be better off dead, or of hurting yourself in some [place]: Not at all  PHQ-9 TOTAL SCORE: 11    GAD7       COPD Assessment

## 2019-07-05 NOTE — Unmapped (Signed)
ERROR

## 2019-07-05 NOTE — Unmapped (Signed)
In preparation for this afternoon's HFU appt, LCSW called pt to complete Social Work Assessment.     REASON FOR VISIT: Hospital Follow-up   Care Management    PLAN:  #Eye Exam  Pt reported he called 1 of the 2 places LCSW provided weeks ago as options to schedule eye exam and get new prescription eye glasses, pt agreed to call LCSW as needed if he needs assistance obtaining affordable eye glasses.    #Ensure  Pt said he was informed last week to expect Ensure coupon in the mail and he will be on the lookout.    #Advance Directives  Patient was provided with advanced directive paperwork and was encouraged to contact clinic with questions about documents.     Pt confirmed he continues to agree that his brother Joseph Phillips is his HCDM. Pt would like to complete Advance Directive paper work to identify his brother as his HCPOA. LCSW is mailing paper work to pt.    #Contact information  Pt was provided w/ SW contact information. Pt's contact information was updated in their chart.    #F/u calls  Pt will receive 2 weeks of telephone f/u from a care team member following visit. Pt encouraged to contact LCSW should needs arise.    ___________________________________________________     HISTORY AND ASSESSMENT:   Joseph Phillips was receptive to phone call with the Internal Medicine clinic for hospital f/u. Pt is by himself.  He reports feeling pretty good  in pain most of the time, pain meds help since discharge.     Pt lives with his mother and brother in [place]. He denies issues with home safety. The patient receives support from his mother and brother, Joseph Phillips. Family, social and cultural characteristics were assessed during the visit and plan.     He is not set up with home health or personal care services and denies need for these services.    Pt reports use of cane to move around his home. Denies need for repair on existing equipment at this time.    Identified barriers to care:  Denies affordability difficulties  Denies transportation barriers, reports brother, Joseph Phillips, helps with transportation as needed. Pt denies using Medicaid Transportation  Denies food insecurity, says he gets Food Stamps assistance  Denies auditory communication challenges  Reports visual communication challenges, says he is pending to follow up with the other eye exam clinic LCSW had shared with him a few weeks ago.  Denies illiteracy     Mental Health/Substance Use:  Alcohol use: 2 beers about 2-3 days ago  Illicit Substance use: denies  Tobacco use:  reports that he has been smoking cigarettes. He started smoking about 25 years ago. He has a 10.00 pack-year smoking history. He has never used smokeless tobacco.  Denies SI/HI or need for mental health support at this time.    Advanced Directives: does not have on file.    Visit discussed directly w/ Dr. Barnetta Chapel who will complete HFU with pt this afternoon.  ___________________________________________________   Joseph Phillips, MSW, LCSW  Care Manager - [facility] Internal Medicine  Phone: [phone]  Pager: [phone]    @[facility] @

## 2019-07-06 NOTE — Unmapped (Signed)
Internal Medicine Phone Visit    This visit is conducted via telephone.    Contact Information  Person Contacted: Patient  Contact Phone number: [phone] (home)   Is there someone else in the room? No.   Patient agreed to a phone visit    ATTEMPT #1 - No answer. VM not set up.  ATTEMPT #2 - Successfully     [name] is a 58 y.o. male  participating in a telephone encounter.    Reason for Call  Hospital Follow-up    Hospital Course:   ??  Joseph Phillips??is a pleasant 58 y.o.??man with alcoholism c/b chronic pancreatitis, and prostate cancer who presented to [facility] with??nausea, vomiting and diffuse abdominal pain due to suspected alcoholic gastritis - he was also showing signs of entering withdrawal.  ??  He was treated with IVF, IV PPI + oral H2 blocker, antiemetic and scheduled librium. With this he did quite well and felt improved by the following morning. He reported some willingness to consider stopping alcohol consumption so was discharged on a brief librium taper to manage mild withdrawal. His PPI was increased to BID to help with his suspect alcoholic gastritis (although alcohol cessation will be the most effective treatment for this).  ??  Subjective:  Feeling ok trying to eat if/when he can also also exercise his legs. Typically exercises on his own. His cousin has lent him therapy bands to use, does not feel like he would benefit from PT or home health at present. Discharged on a librium taper. Has had 2 beers since discharge. Initially stated that he was not interested in abstinence at this time but then on further discussion requested information about inpatient rehab, specifically wanted to know the duration of admission if he were to go in. He lives alone. No nausea or emesis recently. No fevers or chills. Feeling hot easily (baseline). No rigors. Has COPD, on medications, but reports somesubacute breathing difficulty at night -- Present prior to presentation to hospital. Is still present but improved now. No daytime sx -- only present at night. Had negative COVID test while admitted. Has chronic pain, stable on his existing pain regimen.    H/o of depression with PHQ9 of 11 today. Assessed by SW during phone evaluation (see phone note 07/05/19 by Jennell Corner) and denied need for mental health support. SW will f-up on resources for EtOH abstinence.     I have reviewed the problem list, medications, and allergies and have updated/reconciled them if needed.    Objective:  Speaking in full sentences. No respiratory distress. Limited by phone visit. Some difficulty understanding due to mumbling speech.     Assessment & Plan:  1. Alcohol withdrawal syndrome without complication (CMS-HCC)    2. Chronic obstructive pulmonary disease, unspecified COPD type (CMS-HCC)    3. Acute alcoholic gastritis without hemorrhage      1. Alcohol withdrawal syndrome without complication (CMS-HCC)  Reports intake of 2 drinks since discharged. Is contemplative and requested information about inpatient rehab. Also had a phone encounter with SW to review social needs, mood and substance use.   -Continue to encourage abstinence  -SW to follow-up and provide resources through local [facility] for inpatient rehab  -Discussed ASAP program, patient would like to first learn more about inpatient rehab and then review his options for further management    2. Chronic obstructive pulmonary disease, unspecified COPD type (CMS-HCC)  Chronic dyspnea on exertion.  Additionally has symptoms of nighttime breathing difficulty, unclear if this is  due to COPD versus?  PND.  Given his complex history, he would benefit from further evaluation at a face-to-face visit.  He reports that the symptoms are chronic and was negative tested for COVID while inpatient.  -Continue current inhalers without changes  -Due for inhaler teaching, recommend at follow-up visit  -Consider repeat pulmonary function testing once this service is available  -Evaluate with a complete physical exam to determine whether he needs work-up for possible heart failure as an alternate etiology for symptoms    3. Acute alcoholic gastritis without hemorrhage  Reports resolution of symptoms following IV hydration, PPI and sucralfate.  Encouraged ongoing abstinence.  -Reassess at follow-up face-to-face visit, consider repeat lab    4. Prostate CA  Reports chronic pain which is stable on his current opioid regimen.      Return in 1 week (on 07/12/2019).     I spent 12 minutes on the phone with the patient. I spent an additional 5 minutes on pre- and post-visit activities.     The patient was physically located in [place] or a state in which I am permitted to provide care. The patient and/or parent/guardian understood that s/he may incur co-pays and cost sharing, and agreed to the telemedicine visit. The visit was reasonable and appropriate under the circumstances given the patient's presentation at the time.    The patient and/or parent/guardian has been advised of the potential risks and limitations of this mode of treatment (including, but not limited to, the absence of in-person examination) and has agreed to be treated using telemedicine. The patient's/patient's family's questions regarding telemedicine have been answered.     If the visit was completed in an ambulatory setting, the patient and/or parent/guardian has also been advised to contact their provider???s office for worsening conditions, and seek emergency medical treatment and/or call 911 if the patient deems either necessary.

## 2019-07-08 ENCOUNTER — Ambulatory Visit: Admit: 2019-07-08 | Discharge: 2019-07-10 | Payer: MEDICAID

## 2019-07-08 LAB — TOXICOLOGY SCREEN, URINE
AMPHETAMINE SCREEN URINE: <500
BARBITURATE SCREEN URINE: <200
METHADONE SCREEN, URINE: <300
OPIATE SCREEN URINE: <300

## 2019-07-08 LAB — CBC W/ AUTO DIFF
BASOPHILS ABSOLUTE COUNT: 0.1 10*9/L (ref 0.0–0.1)
EOSINOPHILS ABSOLUTE COUNT: 0.2 10*9/L (ref 0.0–0.4)
EOSINOPHILS RELATIVE PERCENT: 2.3 %
HEMATOCRIT: 34 % — ABNORMAL LOW (ref 41.0–53.0)
HEMOGLOBIN: 11.5 g/dL — ABNORMAL LOW (ref 13.5–17.5)
LARGE UNSTAINED CELLS: 4 % (ref 0–4)
LYMPHOCYTES ABSOLUTE COUNT: 2.5 10*9/L (ref 1.5–5.0)
LYMPHOCYTES RELATIVE PERCENT: 31.3 %
MEAN CORPUSCULAR HEMOGLOBIN CONC: 33.9 g/dL (ref 31.0–37.0)
MEAN CORPUSCULAR HEMOGLOBIN: 33.7 pg (ref 26.0–34.0)
MEAN CORPUSCULAR VOLUME: 99.4 fL (ref 80.0–100.0)
MEAN PLATELET VOLUME: 6.8 fL — ABNORMAL LOW (ref 7.0–10.0)
MONOCYTES ABSOLUTE COUNT: 0.4 10*9/L (ref 0.2–0.8)
MONOCYTES RELATIVE PERCENT: 5.6 %
NEUTROPHILS ABSOLUTE COUNT: 4.4 10*9/L (ref 2.0–7.5)
NEUTROPHILS RELATIVE PERCENT: 55.9 %
PLATELET COUNT: 278 10*9/L (ref 150–440)
RED CELL DISTRIBUTION WIDTH: 13.6 % (ref 12.0–15.0)
WBC ADJUSTED: 7.9 10*9/L (ref 4.5–11.0)

## 2019-07-08 LAB — COMPREHENSIVE METABOLIC PANEL
ALBUMIN: 4.5 g/dL (ref 3.5–5.0)
ALKALINE PHOSPHATASE: 293 U/L — ABNORMAL HIGH (ref 38–126)
ALT (SGPT): 21 U/L (ref <50)
ANION GAP: 12 mmol/L (ref 7–15)
AST (SGOT): 56 U/L — ABNORMAL HIGH (ref 19–55)
BILIRUBIN TOTAL: 0.3 mg/dL (ref 0.0–1.2)
BLOOD UREA NITROGEN: 11 mg/dL (ref 7–21)
BUN / CREAT RATIO: 13
CALCIUM: 9.4 mg/dL (ref 8.5–10.2)
CO2: 19 mmol/L — ABNORMAL LOW (ref 22.0–30.0)
CREATININE: 0.87 mg/dL (ref 0.70–1.30)
EGFR CKD-EPI AA MALE: >90 mL/min/{1.73_m2} (ref >=60)
EGFR CKD-EPI NON-AA MALE: >90 mL/min/{1.73_m2} (ref >=60)
POTASSIUM: 4.4 mmol/L (ref 3.5–5.0)
PROTEIN TOTAL: 7.9 g/dL (ref 6.5–8.3)
SODIUM: 142 mmol/L (ref 135–145)

## 2019-07-08 LAB — URINALYSIS WITH CULTURE REFLEX
BACTERIA: NONE SEEN /HPF
BILIRUBIN UA: NEGATIVE
BLOOD UA: NEGATIVE
GLUCOSE UA: NEGATIVE
LEUKOCYTE ESTERASE UA: NEGATIVE
PH UA: 6 (ref 5.0–9.0)
RBC UA: 0 /HPF (ref <3)
SPECIFIC GRAVITY UA: 1.004 — ABNORMAL LOW (ref 1.005–1.040)
SQUAMOUS EPITHELIAL: 2 /HPF (ref 0–5)
UROBILINOGEN UA: 0.2
WBC UA: 2 /HPF — ABNORMAL HIGH (ref <2)

## 2019-07-08 LAB — MACROCYTES

## 2019-07-08 LAB — UROBILINOGEN UA: Lab: 0.2

## 2019-07-08 LAB — LIPASE: Triacylglycerol lipase:CCnc:Pt:Ser/Plas:Qn:: 129

## 2019-07-08 LAB — CO2: Carbon dioxide:SCnc:Pt:Ser/Plas:Qn:: 19 — ABNORMAL LOW

## 2019-07-08 LAB — BARBITURATE SCREEN URINE: Lab: <200

## 2019-07-08 NOTE — Unmapped (Signed)
Patient's call has been returned and a voicemail was unable to be left for my call to be returned.    No other actions taken.

## 2019-07-08 NOTE — Unmapped (Signed)
Joseph Phillips contacted the [employer] requesting to speak with the care team of Joseph Phillips to discuss:    Patient called to speak to Dr. Claude Manges about going into rehab for drinking.    Please contact [name] at [phone].        Check Indicates criteria has been reviewed and confirmed with the patient:    [x]  Preferred Name   [x]  DOB and/or MR#  [x]  Preferred Contact Method  [x]  Phone Number(s)   []  MyChart     Thank you,   Jacques Navy  [facility]   [phone]

## 2019-07-08 NOTE — Unmapped (Signed)
Hi,     Patient contacted the Communication Center regarding the following:    -Would like to discuss going to the rehab today with Dr Claude Manges.    Please contact patient at [phone].    Thanks in advance,    Christell Faith  [facility]   [phone]

## 2019-07-08 NOTE — Unmapped (Signed)
CARE MANAGEMENT ENCOUNTER    LCSW was informed via [employer] that pt had reached out to the Communication Center asking to speak with Dr. Claude Manges in oncology/hemtaology regarding going into rehabilitation for drinking. LCSW was asked to follow up with pt to offer information about process to get into rehabilitation facility. LCSW had offered this information to pt on 7/13, but he had declined it then.    Attempted call with pt twice to offer information about process (refer to 7/13 [facility] Follow-up Care Management Assessment, 3pm note).    LCSW will continue to try to reach pt.    Time Spent: 10 minutes    Jennell Corner, MSW, LCSW  [facility] Manager- [facility] Internal Medicine  Phone: [phone]  Pager: [phone]

## 2019-07-09 LAB — ETHANOL
ETHANOL: 87 mg/dL — ABNORMAL HIGH (ref 0.0–10.0)
Ethanol:MCnc:Pt:Ser/Plas:Qn:GC: 160 — ABNORMAL HIGH
Ethanol:MCnc:Pt:Ser/Plas:Qn:GC: 87 — ABNORMAL HIGH

## 2019-07-09 NOTE — Unmapped (Signed)
Care Management  Initial Transition Planning Assessment   Spoke with patient to obtain info.  Patient lives with his mother, he is independent with ADL's.  Patient stated he is interested in resources for alcohol abuse since he's tired of feeling sick.  He reported staying at a alcohol rehab facility for 7 days in [place] 20 years ago.             General  Care Manager assessed the patient by : Medical record review, Discussion with Clinical Care team  Orientation Level: Oriented X4  Who provides care at home?: N/A    Contact/Decision Maker  Extended Emergency Contact Information  Primary Emergency Contact: Mckamey,Curtis   [place]  Home Phone: [phone]  Mobile Phone: [phone]  Relation: Brother     Type of Residence: Mailing Address:  [address]  [place] [address]  Contacts:   patient or brother  Patient Phone Number: [phone]        Medical Provider(s): Max Jeri Cos, MD  Reason for Admission: Admitting Diagnosis:  Substance use disorder [F19.90]  Past Medical History:   has a past medical history of Alcohol abuse, COPD (chronic obstructive pulmonary disease) (CMS-HCC), radiation therapy, Hypertension, Polysubstance abuse (CMS-HCC), Prostate cancer (CMS-HCC), and Seizures (CMS-HCC).  Past Surgical History:   has a past surgical history that includes Prostate surgery; pr colsc flx w/rmvl of tumor polyp lesion snare tq (N/A, 08/16/2015); pr colsc flx with directed submucosal njx any sbst (N/A, 08/16/2015); and IR Insert Port Age Greater Than 5 Years (07/15/2018).   Previous admit date: 06/22/2019    Primary Insurance- Payor: MEDICAID [place] / Plan: MEDICAID [place] ACCESS / Product Type: *No Product type* /   Secondary Insurance ??? None  Prescription Coverage ??? medicaid  Preferred Pharmacy - [id number] - [place], [place] - [address] AT [facility]  [facility]    Transportation home: Private vehicle  Level of function prior to admission: Independent          Legal Next of Kin / Guardian / POA / Advance Directives     HCDM (patient stated preference): Geisel,Curtis - Brother [phone]    Advance Directive (Medical Treatment)  Does patient have an advance directive covering medical treatment?: Patient does not have advance directive covering medical treatment.    Health Care Decision Maker [HCDM] (Medical & Mental Health Treatment)  Healthcare Decision Maker: HCDM documented in the HCDM/Contact Info section.  Information offered on HCDM, Medical & Mental Health advance directives:: Patient declined information.         Patient Information  Lives with: Parent    Type of Residence: Private residence             Support Systems: Family Members    Responsibilities/Dependents at home?: No    Home Care services in place prior to admission?: No                  Equipment Currently Used at Home: none       Currently receiving outpatient dialysis?: No       Financial Information       Need for financial assistance?: No       Social Determinants of Health  Social Determinants of Health were addressed in provider documentation.  Please refer to patient history.    Discharge Needs Assessment  Concerns to be Addressed: substance/tobacco abuse/use    Clinical Risk Factors:  Barriers to taking medications: No    Prior overnight hospital stay or ED visit in last 90 days: No    Readmission Within the Last 30 Days: no previous admission in last 30 days              Equipment Needed After Discharge: none    Discharge Facility/Level of Care Needs:      Readmission  Risk of Unplanned Readmission Score:  %  Predictive Model Details   No score data available for [facility] Risk of Unplanned Readmission     Readmitted Within the Last 30 Days? (No if blank)   Patient at risk for readmission?: Yes    Discharge Plan       Expected Discharge Date: 7/20    Expected Transfer from Critical Care:      Patient and/or family were provided with choice of facilities / services that are available and appropriate to meet post hospital care needs?: Yes   List choices in order highest to lowest preferred, if applicable. : no preference    Initial Assessment complete?: Yes

## 2019-07-09 NOTE — Unmapped (Signed)
ED Progress Note    This patient, Joseph Phillips, MRN [id number], was signed out to me today, 07/09/2019 by PA [facility].  I have reviewed the pertinent vital signs, labs, imaging, and prior provider and nursing notes.  Patient is a 58 yo who presented with request for alcohol detox, history of complicated detox.  Patient is pending repeat alcohol.  Plan is to admit.

## 2019-07-09 NOTE — Unmapped (Signed)
[facility]  Emergency Department Provider Note     ED Clinical Impression      Final diagnoses:   Substance use disorder (Primary)      Impression, ED Course, Assessment and Plan      Impression: Joseph Phillips is a 58 y.o. male ith a PMH of alcohol abuse, COPD, radiation therapy, HTN, polysubstance abuse, prostate cancer, and seizures, who is presenting to the ED today for detox.  Despite triage nurse, patient denies abdominal pain and dysuria.    BP 133/72  - Pulse 83  - Temp 36.6 ??C (97.8 ??F) (Oral)  - Resp 16  - SpO2 99%   On exam, the patient appears well and is in NAD. VS are WNL. L sounds with diffuse mild expiratory wheezes. HRRR. Abdomen soft and non-tender.  Non-distended.    Patient has a history of complicated detox including withdrawal seizures requiring ICU stay.  Plan for EKG and basic labs including EtOH, toxicology screen, UA, and lipase.     12:18 AM   Patient screened positive for benzodiazepine and cannabinoids. All other labs are unremarkable.    1:03 AM  Ethanol resulted at 160.  Given that patient has history of complicated withdrawal requiring ICU admission, he would require detox here at [facility].  End of shift.  Signout given to Dr. Norlene Campbell with plan to reassess ethanol in several hours and reassess patient's continued desire for detox.    Additional Medical Decision Making and Disclaimers     Joseph Phillips was evaluated in Emergency Department at the time of this visit for the symptoms described in the history of present illness. He was evaluated in the context of the global COVID-19 pandemic, which necessitated consideration that the patient might be at risk for infection with the SARS-CoV-2 virus that causes COVID-19. Institutional protocols and algorithms that pertain to the evaluation of patients at risk for COVID-19 were followed during the patient's care in the ED.    I have reviewed the vital signs and the nursing notes. Labs and radiology results that were available during my care of the patient were independently reviewed by me and considered in my medical decision making.     I reviewed the patient's prior medical records.     Portions of this record have been created using Scientist, clinical (histocompatibility and immunogenetics). Dictation errors have been sought, but may not have been identified and corrected.  ____________________________________________         History        Chief Complaint  Abdominal Pain      HPI   Joseph Phillips is a 58 y.o. male with a PMH of alcohol abuse, COPD, radiation therapy, HTN, polysubstance abuse, prostate cancer, and seizures, who is presenting to the ED today for detox and lower abdominal pain (chronic from his stage 4 prostate cancer and alcohol abuse), nausea, difficulty starting a urinary stream, and intermittent dysuria. He has been increasing his alcohol intake to 3 beers and pint of wine a day. His last drink 2000 today. He was prescribed a medication for nausea, and it has helped his nausea and vomiting. He has hx of withdrawal seizures. Denies fever, chills, sore throat, cough, SOB, or CP. Denies any recent sick contacts, COVID+ contacts, or recent travel.    On chart review, patient was recently discharged for alcohol withdrawal on 06/06/2019. He was admitted to the ICU for fluid resuscitation, CIWA protocol with as need lorazepam and general supportive care. His mother at the time stated  that she witnessed the patient have 2 withdrawal seizures. While admitted, he did not have any frank seizures and very quickly requested to be discharged home 24 hours after admission. His home medications were continued including his Duragesic fentanyl patch and he was given oxycodone for his bony pain from metastatic disease. He appeared to not be following the advice he received from Oncology, as he has been drinking enough to have withdrawal seizures and he has been combining the alcohol with marijuana and cocaine. He was discharged with resources for Alcoholics Anonymous and strict instructions to not drink alcohol while taking his pain medications.    Past Medical History:   Diagnosis Date   ??? Alcohol abuse    ??? COPD (chronic obstructive pulmonary disease) (CMS-HCC)    ??? Hx of radiation therapy    ??? Hypertension    ??? Polysubstance abuse (CMS-HCC)    ??? Prostate cancer (CMS-HCC)    ??? Seizures (CMS-HCC)        Patient Active Problem List   Diagnosis   ??? 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 Acute pancreatitis   ??? Depressive disorder   ??? Reflux esophagitis   ??? Encounter for long-term (current) use of other medications   ??? Pain medication agreement broken   ??? Do not give narcotics   ??? PPD screening test   ??? Tobacco use disorder   ??? Prostate cancer (CMS-HCC)   ??? Hx of radiation therapy   ??? Healthcare maintenance   ??? Sleep difficulties   ??? Essential hypertension   ??? Anxiety   ??? Hypokalemia   ??? Hypomagnesemia   ??? Herpes zoster involving sacral dermatome   ??? Stomatitis and mucositis   ??? Odynophagia   ??? Mucositis due to chemotherapy   ??? Chest pain, mid sternal   ??? Acute midline thoracic back pain   ??? Hyperkalemia   ??? COPD (chronic obstructive pulmonary disease) (CMS-HCC)   ??? Chronic pancreatitis (CMS-HCC)   ??? 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 Cough   ??? Fever and chills   ??? Acute alcoholic gastritis without hemorrhage   ??? Alcohol withdrawal syndrome without complication (CMS-HCC)       Past Surgical History:   Procedure Laterality Date   ??? IR INSERT PORT AGE GREATER THAN 5 YRS  07/15/2018    IR INSERT PORT AGE GREATER THAN 5 YRS 07/15/2018 Carolin Coy, MD IMG [facility]   ??? PR COLSC FLX W/RMVL OF TUMOR POLYP LESION SNARE TQ N/A 08/16/2015    Procedure: COLONOSCOPY FLEX; W/REMOV TUMOR/LES BY SNARE;  Surgeon: Gwen Pounds, MD;  Location: [facility];  Service: Gastroenterology   ??? PR COLSC FLX WITH DIRECTED SUBMUCOSAL NJX ANY SBST N/A 08/16/2015    Procedure: COLONOSCOPY, FLEXIBLE, PROXIMAL TO SPLENIC FLEXURE; WITH DIRECTED SUBMUCOSAL INJECTION(S), ANY SUBSTANCE;  Surgeon: Gwen Pounds, MD;  Location: [facility];  Service: Gastroenterology   ??? PROSTATE SURGERY           Current Facility-Administered Medications:   ???  ondansetron (ZOFRAN-ODT) disintegrating tablet 4 mg, 4 mg, Oral, Once, [place], [place]    Current Outpatient Medications:   ???  albuterol HFA 90 mcg/actuation inhaler, Inhale 2 puffs every six (6) hours as needed for wheezing., Disp: 2 Inhaler, Rfl: 3  ???  amLODIPine (NORVASC) 10 MG tablet, Take 1 tablet (10 mg total) by mouth daily., Disp: 30 tablet, Rfl: 11  ???  busPIRone (BUSPAR)  5 MG tablet, TAKE 1 TABLET BY MOUTH THREE TIMES A DAY, Disp: 270 tablet, Rfl: 3  ???  calcium carbonate 650 mg calcium (1,625 mg) tablet, Take 1 tablet (650 mg of elem calcium total) by mouth daily., Disp: 30 tablet, Rfl: 11  ???  diclofenac sodium (VOLTAREN) 1 % gel, Apply 2 g topically Four (4) times a day. Any area of pain, Disp: 200 g, Rfl: 5  ???  diphenhydrAMINE (BENADRYL) 25 mg tablet, Take 0.5 tablets (12.5 mg total) by mouth every six (6) hours as needed for itching., Disp: 30 capsule, Rfl: 0  ???  diphenhydramine HCl (MAGIC MOUTHWASH ORAL) suspension, Take 10 mL by mouth 4 (four) times a day as needed., Disp: 120 mL, Rfl: 1  ???  enzalutamide (XTANDI) 40 mg capsule, Take 4 capsules (160 mg total) by mouth daily., Disp: 120 capsule, Rfl: 5  ???  famotidine (PEPCID) 20 MG tablet, Take 1 tablet (20 mg total) by mouth two (2) times a day as needed for heartburn., Disp: 60 tablet, Rfl: 0  ???  fentaNYL (DURAGESIC) 25 mcg/hr patch, Place 1 patch on the skin every third day., Disp: 10 patch, Rfl: 0  ???  megestroL (MEGACE) 400 mg/10 mL (40 mg/mL) suspension, Take 10 mL (400 mg total) by mouth Two (2) times a day. (Patient not taking: Reported on 07/05/2019), Disp: 480 mL, Rfl: 12  ???  melatonin 3 mg Tab, TK 1 T PO HS PRN, Disp: , Rfl:   ???  MULTIVITAMIN 50 PLUS Tab, TK 1 T PO D, Disp: , Rfl:   ???  omeprazole (PRILOSEC) 20 MG capsule, Take 2 capsules (40 mg total) by mouth Two (2) times a day., Disp: 120 capsule, Rfl: 2  ???  oxyCODONE (ROXICODONE) 5 MG immediate release tablet, Take 2 tablets (10 mg total) by mouth every four (4) hours as needed for pain., Disp: 100 tablet, Rfl: 0  ???  pancrelipase, Lip-Prot-Amyl, (CREON) 24,000-76,000 -120,000 unit CpDR delayed release capsule, Take 48,000 units of lipase by mouth Three (3) times a day with a meal. Take an additional 1 capsule with snacks., Disp: , Rfl:   ???  prochlorperazine (COMPAZINE) 10 MG tablet, Take 1 tablet (10 mg total) by mouth every eight (8) hours as needed., Disp: 30 tablet, Rfl: 1  ???  salmeteroL (SEREVENT) 50 mcg/dose diskus inhaler, Inhale 1 puff Two (2) times a day., Disp: 1 Inhaler, Rfl: 12  ???  tiotropium (SPIRIVA WITH HANDIHALER) 18 mcg inhalation capsule, Place 1 capsule (18 mcg total) into inhaler and inhale once daily., Disp: 90 capsule, Rfl: 3  ???  traZODone (DESYREL) 50 MG tablet, Take 1 tablet (50 mg total) by mouth nightly as needed for sleep., Disp: 90 tablet, Rfl: 3  ???  valACYclovir (VALTREX) 500 MG tablet, Take 1 tablet (500 mg total) by mouth daily. (Patient not taking: Reported on 07/05/2019), Disp: 90 tablet, Rfl: 3    Allergies  Penicillins and Tramadol    Family History   Problem Relation Age of Onset   ??? Heart disease Mother    ??? Diabetes Mother    ??? Diabetes Father    ??? Melanoma Neg Hx    ??? Basal cell carcinoma Neg Hx    ??? Squamous cell carcinoma Neg Hx        Social History  Social History     Tobacco Use   ??? Smoking status: Current Every Day Smoker     Packs/day: 0.25     Years: 40.00  Pack years: 10.00     Types: Cigarettes     Start date: 05/09/1994   ??? Smokeless tobacco: Never Used   ??? Tobacco comment: Declined NRT and declined referral to [facility].    Substance Use Topics   ??? Alcohol use: Yes     Alcohol/week: 36.0 standard drinks     Types: 36 Cans of beer per week     Comment: states 3 or 4 big beers a day every day   ??? Drug use: Not Currently Types: Marijuana       Review of Systems     Constitutional: Negative for fever.  Eyes: Negative for visual changes.  ENT: Negative for sore throat.  Cardiovascular: Negative for chest pain.  Respiratory: Negative for shortness of breath or cough.  Gastrointestinal: Positive for nausea. Negative for abdominal pain, vomiting or diarrhea.  Genitourinary: Positive for difficulty starting a urinary stream and intermittent dysuria.   Musculoskeletal: Negative for back pain.  Skin: Negative for rash.  Neurological: Negative for headaches, focal weakness or numbness.     Physical Exam     This provider entered the patient's room: Yes:    ? If this provider did enter the patient room, the following was PPE worn: Surgical mask, eye protection and gloves      ED Triage Vitals [07/08/19 1955]   Enc Vitals Group      BP 133/72      Heart Rate 83      SpO2 Pulse       Resp 16      Temp 36.6 ??C (97.8 ??F)      Temp Source Oral      SpO2 99 %     Constitutional: Alert and oriented. Well appearing and in no distress.  Eyes: Conjunctivae are normal.  ENT       Head: Normocephalic and atraumatic.       Nose: No rhinorrhea.       Mouth/Throat: Mucous membranes appear moist.       Neck: No audible stridor.  Cardiovascular: Heart regular rate and rhythm.  No murmurs.  Respiratory: Normal respiratory pattern and effort. Diffuse, light expiratory wheezing. Speaking easily in full sentences.   Gastrointestinal: Abdomen soft and non-tender. No signs of ascites.   Musculoskeletal: Moving all extremities normally.  Neurologic: Normal speech and language. No significant neurologic deficits.   Skin: Skin appears warm and dry. No rash visible.  Psychiatric: Mood and affect are normal. Speech and behavior are normal.     EKG        Radiology       Documentation assistance was provided by Horton Finer, Scribe, on July 08, 2019 at 10:46 PM for [facility], PA-C.    Documentation assistance was provided by the scribe in my presence.  The documentation recorded by the scribe has been reviewed by me and accurately reflects the services I personally performed.       Frederik Schmidt [place], [place]  07/09/19 0107

## 2019-07-09 NOTE — Unmapped (Signed)
General Medicine History and Physical    Assessment/Plan:    Principal Problem:    Alcohol withdrawal syndrome without complication (CMS-HCC)  Active Problems:    Depressive disorder    Tobacco use disorder    Prostate cancer (CMS-HCC)    Sleep difficulties    COPD (chronic obstructive pulmonary disease) (CMS-HCC)    Chronic pancreatitis (CMS-HCC)    Acute alcoholic gastritis without hemorrhage      Joseph Phillips is a 58 y.o. male with history of alcohol abuse and recent admission for alcoholic gastritis and alcohol withdrawal presents to care requesting detox    Alcohol abuse with request for detox, recent admission for early alcohol withdrawal - no complications with withdrawal last admission although chart suggests there has been history of seizure with withdrawal in the distant past   ???Librium 25 mg 3 times daily  ??? Oral Ativan as needed based on symptoms per CIWA  ??? IV thiamine  ???Multivitamin  ???Social work evaluation for outpatient resources for detox and rehabilitation    Alcoholic gastritis  ???Continue famotidine 20 mg twice daily, omeprazole 40 mg twice daily    Pancreatic insufficiency due to chronic pancreatitis ???continue home Creon 40 8K 3 times daily with meal  Hypertension???well-controlled, continue amlodipine 10 mg daily  MDD???continue BuSpar 5 mg 3 times daily, trazodone 50 mg nightly  Prostate cancer???continue home xtandi 160 mg daily, continue home pain meds, Chronic pain related to cancer ???review of PDMP, continue fentanyl 25 MCG per hour patch every 72 hours, oxycodone 10 mg every 4 hours as needed pain  COPD -chronic dyspnea on exertion, unchanged from baseline, continue home inhalers (pharmacy substitute alternative)     Code Status:  Full Code   DVT Px heparin  Diet. Reg  Dispo: Admission for alcohol withdrawal, last admission had uncomplicated withdrawal did not require prolonged admission may be able to discharge home or to rehabilitation center within the next 24 to 48 hours  Access: patient appears to have port right upper chest   ___________________________________________________________________    Chief Complaint  Chief Complaint   Patient presents with   ??? Abdominal Pain       HPI:  Joseph Phillips is a 58 y.o. male with prostate cancer, pancreatic insufficiency due to alcohol use alcohol abuse And recent admission for alcohol gastritis and alcohol withdrawal presents to care requesting detox.  His last drink was right before presentation.  He states he doesn't want to feel sick anymore.  He lives with his mother.  Denies history of seizure, although chart suggests there may be history of complicated withdrawal.  In the Emergency department U tox was positive for benzodiazepines and cannabinoids, ethanol level initially 160 reduced to 87 at 4 AM.  KG normal sinus rhythm with QTc 484 ms.  Chloride 111, CO2 19, creatinine normal, AST 57, ALT 21, alk phos 293.  Lipase 129.  UA without pyuria.  Last admission the patient was treated for acute alcoholic gastritis without hemorrhage placed on twice daily PPI and H2 blocker as well as sucralfate.  He was also placed on Librium 25 mix 3 times daily with oral oral Ativan as needed, discharged home 7/1.    On my exam he was very tired from being in the ED overnight and did not want to interact much due to fatigue. Had no new complaints.     Allergies:  Penicillins and Tramadol    Medications:   Prior to Admission medications    Medication Dose, Route, Frequency  albuterol HFA 90 mcg/actuation inhaler 2 puffs, Inhalation, Every 6 hours PRN   amLODIPine (NORVASC) 10 MG tablet 10 mg, Oral, Daily (standard)   busPIRone (BUSPAR) 5 MG tablet TAKE 1 TABLET BY MOUTH THREE TIMES A DAY   calcium carbonate 650 mg calcium (1,625 mg) tablet 1 tablet, Oral, Daily (standard)   diclofenac sodium (VOLTAREN) 1 % gel 2 g, Topical, 4 times a day, Any area of pain   diphenhydrAMINE (BENADRYL) 25 mg tablet 12.5 mg, Oral, Every 6 hours PRN   diphenhydramine HCl (MAGIC MOUTHWASH ORAL) suspension 10 mL, Oral, 4 times daily PRN   enzalutamide (XTANDI) 40 mg capsule 160 mg, Oral, Daily (standard)   famotidine (PEPCID) 20 MG tablet 20 mg, Oral, 2 times a day PRN   fentaNYL (DURAGESIC) 25 mcg/hr patch 1 patch, Transdermal, Every 72 hours   megestroL (MEGACE) 400 mg/10 mL (40 mg/mL) suspension 400 mg, Oral, 2 times a day (standard)   melatonin 3 mg Tab TK 1 T PO HS PRN   MULTIVITAMIN 50 PLUS Tab TK 1 T PO D   omeprazole (PRILOSEC) 20 MG capsule 40 mg, Oral, 2 times a day (standard)   oxyCODONE (ROXICODONE) 5 MG immediate release tablet 10 mg, Oral, Every 4 hours PRN   pancrelipase, Lip-Prot-Amyl, (CREON) 24,000-76,000 -120,000 unit CpDR delayed release capsule 48,000 units of lipase, Oral, 3 times a day (with meals), Take an additional 1 capsule with snacks.    prochlorperazine (COMPAZINE) 10 MG tablet 10 mg, Oral, Every 8 hours PRN   salmeteroL (SEREVENT) 50 mcg/dose diskus inhaler 1 puff, Inhalation, 2 times a day (standard)   tiotropium (SPIRIVA WITH HANDIHALER) 18 mcg inhalation capsule 18 mcg, Inhalation, Daily (RT)   traZODone (DESYREL) 50 MG tablet 50 mg, Oral, Nightly PRN   valACYclovir (VALTREX) 500 MG tablet 500 mg, Oral, Daily (standard)       Medical History:  Past Medical History:   Diagnosis Date   ??? Alcohol abuse    ??? COPD (chronic obstructive pulmonary disease) (CMS-HCC)    ??? Hx of radiation therapy    ??? Hypertension    ??? Polysubstance abuse (CMS-HCC)    ??? Prostate cancer (CMS-HCC)    ??? Seizures (CMS-HCC)        Surgical History:  Past Surgical History:   Procedure Laterality Date   ??? IR INSERT PORT AGE GREATER THAN 5 YRS  07/15/2018    IR INSERT PORT AGE GREATER THAN 5 YRS 07/15/2018 Carolin Coy, MD IM[facility]   ??? PR COLSC FLX W/RMVL OF TUMOR POLYP LESION SNARE TQ N/A 08/16/2015    Procedure: COLONOSCOPY FLEX; W/REMOV TUMOR/LES BY SNARE;  Surgeon: Gwen Pounds, MD;  [facility]: [facility];  Service: Gastroenterology   ??? PR COLSC FLX WITH DIRECTED SUBMUCOSAL NJX ANY SBST N/A 08/16/2015    Procedure: COLONOSCOPY, FLEXIBLE, PROXIMAL TO SPLENIC FLEXURE; WITH DIRECTED SUBMUCOSAL INJECTION(S), ANY SUBSTANCE;  Surgeon: Gwen Pounds, MD;  [facility]: [facility];  Service: Gastroenterology   ??? PROSTATE SURGERY         Social History:  Tobacco use:   reports that he has been smoking cigarettes. He started smoking about 25 years ago. He has a 10.00 pack-year smoking history. He has never used smokeless tobacco.  Alcohol use:   reports current alcohol use of about 36.0 standard drinks of alcohol per week.  Drug use:  reports previous drug use. Drug: Marijuana.  Living situation: the patient lives with mother.  Family History:  Family History   Problem Relation Age of Onset   ??? Heart disease Mother    ??? Diabetes Mother    ??? Diabetes Father    ??? Melanoma Neg Hx    ??? Basal cell carcinoma Neg Hx    ??? Squamous cell carcinoma Neg Hx        Review of Systems:  10 systems reviewed and are negative unless otherwise mentioned in HPI      Physical Exam:  Temp:  [36.6 ??C (97.8 ??F)-36.7 ??C (98.1 ??F)] 36.7 ??C (98.1 ??F)  Heart Rate:  [83-96] 96  Resp:  [16-22] 22  BP: (133-150)/(72-86) 150/86  SpO2:  [99 %-100 %] 100 %  There is no height or weight on file to calculate BMI.    General appearance - alert, well appearing, and in no distress  Eyes - Sclera anicteric, conjunctiva pink  Mouth - mucous membranes moist, pharynx normal without lesions  Neck - Trachea supple and midline, (-) JVD   Lymphatics - no palpable lymphadenopathy  Heart - normal rate, regular rhythm, normal S1, S2, no murmurs, rubs, clicks or gallops  Chest - clear to auscultation, no wheezes, rales or rhonchi, symmetric air entry  Abdomen - soft, nontender, nondistended, no masses or organomegaly  Extremities - No pedal edema, no clubbing or cyanosis  Skin - normal coloration and turgor, no rashes, no suspicious skin lesions noted  Neurological - alert, oriented, normal speech, no focal findings or movement disorder noted  Access: port right upper chest       Test Results:  Data Review:  I have reviewed the labs and studies from the last 24 hours.    Imaging: Radiology studies were personally reviewed    EKG: normal EKG, normal sinus rhythm, unchanged from previous tracings, QT >480 ms.

## 2019-07-09 NOTE — Unmapped (Signed)
Pt states he wishes to detox and has had lower abd pain for 2 days.  Pt states he is having trouble starting urinary stream and has intermittent burning.  Pt taking medication for nausea associated with abdominal pain.

## 2019-07-09 NOTE — Unmapped (Signed)
CARE MANAGEMENT ENCOUNTER    LCSW didn't pursue calling pt again today to follow up on his interest in getting inpatient rehabilitation, LCSW learned per chart review is currently inpt.    Time Spent: 2 minutes    Jennell Corner, MSW, LCSW  [facility] Manager- [facility]  Phone: [phone]  Pager: [phone]

## 2019-07-10 LAB — CBC
HEMATOCRIT: 31.1 % — ABNORMAL LOW (ref 41.0–53.0)
HEMOGLOBIN: 11.1 g/dL — ABNORMAL LOW (ref 13.5–17.5)
MEAN CORPUSCULAR HEMOGLOBIN CONC: 35.7 g/dL (ref 31.0–37.0)
MEAN CORPUSCULAR VOLUME: 97.9 fL (ref 80.0–100.0)
MEAN PLATELET VOLUME: 7.7 fL (ref 7.0–10.0)
RED BLOOD CELL COUNT: 3.18 10*12/L — ABNORMAL LOW (ref 4.50–5.90)
RED CELL DISTRIBUTION WIDTH: 13.5 % (ref 12.0–15.0)
WBC ADJUSTED: 5.6 10*9/L (ref 4.5–11.0)

## 2019-07-10 LAB — BASIC METABOLIC PANEL
ANION GAP: 8 mmol/L (ref 7–15)
BLOOD UREA NITROGEN: 13 mg/dL (ref 7–21)
BUN / CREAT RATIO: 19
CALCIUM: 8.1 mg/dL — ABNORMAL LOW (ref 8.5–10.2)
CHLORIDE: 111 mmol/L — ABNORMAL HIGH (ref 98–107)
CO2: 20 mmol/L — ABNORMAL LOW (ref 22.0–30.0)
CREATININE: 0.7 mg/dL (ref 0.70–1.30)
EGFR CKD-EPI AA MALE: >90 mL/min/{1.73_m2} (ref >=60)
EGFR CKD-EPI NON-AA MALE: >90 mL/min/{1.73_m2} (ref >=60)
POTASSIUM: 4.1 mmol/L (ref 3.5–5.0)
SODIUM: 139 mmol/L (ref 135–145)

## 2019-07-10 LAB — CREATININE: Creatinine:MCnc:Pt:Ser/Plas:Qn:: 0.7

## 2019-07-10 LAB — MAGNESIUM: Magnesium:MCnc:Pt:Ser/Plas:Qn:: 1.9

## 2019-07-10 LAB — HEMATOCRIT: Lab: 31.1 — ABNORMAL LOW

## 2019-07-10 MED ORDER — CHLORDIAZEPOXIDE 25 MG CAPSULE
ORAL_CAPSULE | 0 refills | 0 days | Status: CP
Start: 2019-07-10 — End: ?

## 2019-07-10 NOTE — Unmapped (Signed)
Hospitalist Daily Progress Note     LOS: 0 days       Assessment/Plan:  Principal Problem:    Alcohol withdrawal syndrome without complication (CMS-HCC)  Active Problems:    Depressive disorder    Tobacco use disorder    Prostate cancer (CMS-HCC)    Sleep difficulties    COPD (chronic obstructive pulmonary disease) (CMS-HCC)    Chronic pancreatitis (CMS-HCC)    Acute alcoholic gastritis without hemorrhage  Resolved Problems:    * No resolved hospital problems. *         1.  Alcoholism with admission for alcohol withdrawal syndrome, CIWA scores have been 0 but he was legally intoxicated on admission and may very well develop withdrawal symptoms.  Continue CIWA protocol, scheduled Librium and as needed lorazepam.  He has not shown any sign of seizure activity.  Discussed with case management tomorrow the patient is options for inpatient treatment programs and what he is willing to proceed with.    2.  History of prostate cancer.  I received a question from Pharmacy regarding the patient's medication [name] that he takes for prostate cancer suppression.  It is recommended that this be discontinued during his hospitalization due to his risk for seizure from alcohol withdrawal.  The medication is discontinued.    3.  COPD, not in exacerbation.  Continue current management.    4.  History of chronic pancreatitis.  Continue PRN oxycodone.        *DVT prophylaxis: Subcutaneous heparin.    *Disposition: Home versus inpatient treatment for alcoholism after he has had a chance to consider his options.      Please page the [facility]) pager at [phone] with questions.      Subjective:   No complaint of visual hallucinations, tremor.  No reports of anything resembling a seizure.    Objective:       Vital signs in last 24 hours:  Temp:  [36.3 ??C (97.3 ??F)-37 ??C (98.6 ??F)] 37 ??C (98.6 ??F)  Heart Rate:  [76-96] 76  Resp:  [16-22] 17  BP: (129-153)/(63-86) 129/63  MAP (mmHg):  [90-109] 90  SpO2:  [99 %-100 %] 99 %  BMI (Calculated):  [25.31] 25.31    Intake/Output last 24 hours:    Intake/Output Summary (Last 24 hours) at 07/09/2019 1818  Last data filed at 07/09/2019 1000  Gross per 24 hour   Intake 120 ml   Output ???   Net 120 ml         Physical Exam:    Gen: Drowsy but awakens to voice.  Flat demeanor, conversant.    HEENT: No icterus.    CV: Regular rate and rhythm.    PULM/chest: Clear to auscultation.    AVW:UJWJ, Non tender, No palpable organomegaly    Ext: No C/C/E    Neuro: No acute focal deficits.    Skin: Warm, dry.      Medications:   Scheduled Meds:  ??? amLODIPine  10 mg Oral Daily   ??? arformoteroL  15 mcg Nebulization BID (RT)   ??? busPIRone  5 mg Oral BID   ??? calcium carbonate  600 mg of elem calcium Oral Daily   ??? chlordiazePOXIDE  25 mg Oral TID   ??? enzalutamide  160 mg Oral Daily   ??? famotidine  20 mg Oral BID   ??? fentaNYL  1 patch Transdermal Q72H   ??? heparin (porcine) for subcutaneous use  5,000 Units Subcutaneous [facility]   ???  megestroL  400 mg Oral BID   ??? melatonin  3 mg Oral QPM   ??? pancrelipase (Lip-Prot-Amyl)  48,000 units of lipase Oral 3xd Meals   ??? pantoprazole  40 mg Oral BID   ??? thiamine  200 mg Intravenous Daily   ??? umeclidinium  1 puff Inhalation Daily (RT)     Continuous Infusions:    Lab Results   Component Value Date    WBC 7.9 07/08/2019    HGB 11.5 (L) 07/08/2019    HCT 34.0 (L) 07/08/2019    PLT 278 07/08/2019       Lab Results   Component Value Date    NA 142 07/08/2019    K 4.4 07/08/2019    CL 111 (H) 07/08/2019    CO2 19.0 (L) 07/08/2019    BUN 11 07/08/2019    CREATININE 0.87 07/08/2019    GLU 113 07/08/2019    CALCIUM 9.4 07/08/2019    MG 1.8 06/22/2019    PHOS 2.4 (L) 06/28/2019       Lab Results   Component Value Date    BILITOT 0.3 07/08/2019    BILIDIR <0.10 06/28/2019    PROT 7.9 07/08/2019    ALBUMIN 4.5 07/08/2019    ALT 21 07/08/2019    AST 56 (H) 07/08/2019    ALKPHOS 293 (H) 07/08/2019       Lab Results   Component Value Date    PT 12.5 06/04/2019    INR 1.09 06/04/2019 APTT 36.5 06/04/2019   35 minutes, over 50% spent in counseling and coordination of care.  Tawni Levy MD

## 2019-07-10 NOTE — Unmapped (Signed)
Physician Discharge Summary    Admit date: 07/08/2019    Discharge date and time: 07/10/2019    Discharge to: Home    Discharge Service: Med Undesignated (MDX)    Discharge Attending Physician: Joseph Koyanagi, MD    Discharge Diagnoses: Detox from alcohol intoxication, no sign of clinically significant alcohol withdrawal syndrome.    Hospital Course: The patient is a 58 year old African-American male with a longstanding history of alcoholism who says he resumed drinking within the last year or 2 after both of his brothers died  within a 1 year period of time.Marland Kitchen  He has been through treatment previously and has had as long as 10 years of sobriety at a time.    He presented to the [facility] emergency department intoxicated with a blood alcohol level of 160 mg/dL, although he has been to the emergency room in recent months with blood levels as high as 250 mg/dL.  He requested detox from alcohol.  He reported remote history of alcohol withdrawal seizure.    He was mended to the hospital, started on CIWA protocol with baseline Librium orally.  His CIWA scores remained at 0 for a 48-hour period of time and his hospitalization was otherwise uneventful.  He was without any symptoms clinically of alcohol withdrawal otherwise.    He was provided with a short course of Librium to complete as an outpatient.Marland Kitchen  He declined all offers for any outpatient follow-up or treatment for alcoholism or allowing [place] to arrange an inpatient treatment program.  Nevertheless, it was recommended that he contact a local Alcoholics Anonymous meeting.    He has an appointment in 48 hours at the [facility] in [facility] and assures me that he will be following up there..      Condition at Discharge: stable  Discharge Medications:      Your Medication List      START taking these medications    chlordiazePOXIDE 25 MG capsule  Commonly known as: LIBRIUM  1 tab this afternoon, 1 this evening, every 8 hours tomorrow and stop        CONTINUE taking these medications    albuterol 90 mcg/actuation inhaler  Commonly known as: PROVENTIL HFA;VENTOLIN HFA  Inhale 2 puffs every six (6) hours as needed for wheezing.     amLODIPine 10 MG tablet  Commonly known as: NORVASC  Take 1 tablet (10 mg total) by mouth daily.     busPIRone 5 MG tablet  Commonly known as: BUSPAR  TAKE 1 TABLET BY MOUTH THREE TIMES A DAY     calcium carbonate 650 mg calcium (1,625 mg) tablet  Take 1 tablet (650 mg of elem calcium total) by mouth daily.     CREON 24,000-76,000 -120,000 unit Cpdr delayed release capsule  Generic drug: pancrelipase (Lip-Prot-Amyl)  Take 48,000 units of lipase by mouth Three (3) times a day with a meal. Take an additional 1 capsule with snacks.     diclofenac sodium 1 % gel  Commonly known as: VOLTAREN  Apply 2 g topically Four (4) times a day. Any area of pain     diphenhydrAMINE 25 mg tablet  Commonly known as: BENADRYL  Take 0.5 tablets (12.5 mg total) by mouth every six (6) hours as needed for itching.     famotidine 20 MG tablet  Commonly known as: PEPCID  Take 1 tablet (20 mg total) by mouth two (2) times a day as needed for heartburn.     fentaNYL 25 mcg/hr  patch  Commonly known as: DURAGESIC  Place 1 patch on the skin every third day.     magic mouthwash oral suspension  Take 10 mL by mouth 4 (four) times a day as needed.     megestroL 400 mg/10 mL (40 mg/mL) suspension  Commonly known as: MEGACE  Take 10 mL (400 mg total) by mouth Two (2) times a day.     melatonin 3 mg Tab  TK 1 T PO HS PRN     MULTIVITAMIN 50 PLUS Tab  Generic drug: multivitamin-minerals-lutein  TK 1 T PO D     omeprazole 20 MG capsule  Commonly known as: PriLOSEC  Take 2 capsules (40 mg total) by mouth Two (2) times a day.     oxyCODONE 5 MG immediate release tablet  Commonly known as: ROXICODONE  Take 2 tablets (10 mg total) by mouth every four (4) hours as needed for pain.     prochlorperazine 10 MG tablet  Commonly known as: COMPAZINE  Take 1 tablet (10 mg total) by mouth every eight (8) hours as needed.     salmeteroL 50 mcg/dose diskus inhaler  Commonly known as: SEREVENT  Inhale 1 puff Two (2) times a day.     SPIRIVA WITH HANDIHALER 18 mcg inhalation capsule  Generic drug: tiotropium  Place 1 capsule (18 mcg total) into inhaler and inhale once daily.     traZODone 50 MG tablet  Commonly known as: DESYREL  Take 1 tablet (50 mg total) by mouth nightly as needed for sleep.     valACYclovir 500 MG tablet  Commonly known as: VALTREX  Take 1 tablet (500 mg total) by mouth daily.     XTANDI 40 mg capsule  Generic drug: enzalutamide  Take 4 capsules (160 mg total) by mouth daily.            Pending Test Results:       Discharge Instructions:       Appointments which have been scheduled for you    Jul 12, 2019  4:00 PM  (Arrive by 3:45 PM)  RETURN  RESIDENT with Max Jeri Cos, MD  [facility] INTERNAL MEDICINE [facility] REGION) [address] [place] [address]  [phone]      Aug 10, 2019 10:30 AM  (Arrive by 10:15 AM)  RETURN TELEPHONE with Neldon Labella, RD/LDN  [facility] INTERNAL MEDICINE [facility] REGION) [address]  [place] [address]  [phone]      Aug 31, 2019  9:00 AM  (Arrive by 8:45 AM)  RETURN ACTIVE [place] with Towanda Malkin, MD  [facility]COLOGY [facility]EMATOLOGY [facility] REGION) [address]  [place] [address]  [phone]      Aug 31, 2019  9:30 AM  (Arrive by 9:15 AM)  Danelle Earthly NURSE with [facility] INJECTION [facility]  [facility]OGY [facility] REGION) [address]  [place] [address]  [phone]            Resources and Referrals     UPDATED: 12/24/2017     How to find Mental Health Treatment in [facility]     Suicide & Crisis Hotlines  ?? The Hopeline   ? Call or text: 938-772-4570  ? Online chat: www.hopeline.com  ?? National Hopeline Network: 1-800-SUICIDE / 870 348 7928  ?? National Suicide Prevention Lifeline 671-144-1864 / 606-443-0110  ? Online chat:  www.suicidepreventionlifeline.org     Substance Abuse Treatment  ?? Alcohol/Drug Council of [place]              206 288 6106 - http://www.https://www.schmidt.com/     ?? Substance Abuse and Mental Health Services Administration [facility]) treatment locator              SurvivorMart.com.pt.aspx     ?? [facility]'s Alcohol and Substance Abuse Program (ASAP)              All major health insurance, Medicare, and Medicaid are accepted              [phone]              www.uncmedicalcenter.org/[place]/care-treatment/alcohol-and-substance-abuse/     ?? [facility]: (860) 374-3062 - http://freedomhouserecovery.org/     ?? [facility]: 786-248-2530     Mobile Crisis in [place]: https://www.hunter.org/ -- search for your county     [facility] DEPARTMENT OF PSYCHIATRY: https://www.ward.com/  ?? [facility]: The Acute Diagnostic and Treatment Clinic (ADTC) is a general adult psychiatric clinic staffed by teams of faculty and resident psychiatrists. For appointments, please call [phone].     ?? [facility] ??? [facility] Child & Adolescent Psychiatry:  To obtain an appointment for your child to be seen in our clinic, please call [phone].     ??  Comprehensive Cancer Support Program is dedicated to helping patients and their loved ones with cancer treatment, recovery, and survivorship, the CCSP offers programs and services both during and after your treatment.  Call [phone] to make an appointment.     ?? [facility] ??? [facility] Child Psychiatry Outpatient Program, [facility] Psychiatry Pediatric Behavior Clinic, [facility] Faculty Physicians and [facility] Parenting Initiative Clinic: To obtain an appointment at one of these clinics, please call [phone].     ?? [facility] of Excellence for Eating Disorders:  [phone]     ?? [facility] for Women's Mood Disorders:  [phone] How to Find Mental Health Treatment  If you have health insurance or a Medicare Advantage Plan, look on your insurance card for the phone number or website to access a list of mental health providers who accept your insurance.     If you have Medicaid, Medicare or no insurance, see below for your county's MCO. Call the access number to schedule an appointment.     Medicare.gov can also help you locate mental health providers in your area that accept regular Medicare. (Search for ???behavioral health??? physicians using the Physician Compare Tool.)     For the most up-to-date information, please visit: http://www.price-smith.com/     [facility] Counties served [employer] [place], [place], [place], [place] Crisis Line: [phone]  www.AllianceBHC.org   [employer], [place], [place], [place], [place], [place], [place], [place], [place], [place], [place], [place], [place], [place], [place], [place], [place], [place], Alvie Heidelberg Crisis Line: [phone]  www.Cardinalinnovations.[facility] Lyn Hollings[place], [place], Gigi Gin, [place], [place], [place], [place], [place], [place], [place], [place], [place], [place], [place], [place], [place], [place], [place], [place], Kemper Durie Crisis Line: [phone]  www.VayaHealth.com   [facility] [place], [place], [place], [place], [place], [place], [place], [place], [place], Andrey Campanile Crisis Line: [phone]  www.Eastpointe.net   [facility] [address] Management Ebbie Latus, [place], [place], [place], Corky Crafts Crisis Line: [phone]  www.[email or web address] [place], [place], [place], [place], [place], [place], Wyatt Port[place], [place] Crisis Line: 9374329091  www.SandhillsCenter.org   [facility] [place], [place],  [place], [place], [place], [place], [place], [place], [place], [place], [place], [place], [place], [place], [place], [place], [place], [place], [place], [address], [place], [place], [place], [place], [place], [place] Crisis Line: [phone]  www.TrilliumHealthResources.org                           I spent less than 30 minutes in the discharge of this patient.    Tawni Levy MD

## 2019-07-10 NOTE — Unmapped (Signed)
Patient alert and oriented x4.  VSS.  Continent of bowel and bladder.  Ambulates independently.  Takes pills whole.  No complaint of pain or discomfort at this time.  Call light in reach.  Problem: Adult Inpatient Plan of Care  Goal: Plan of Care Review  Outcome: Progressing  Goal: Patient-Specific Goal (Individualization)  Outcome: Progressing  Goal: Absence of Hospital-Acquired Illness or Injury  Outcome: Progressing  Goal: Optimal Comfort and Wellbeing  Outcome: Progressing  Goal: Readiness for Transition of Care  Outcome: Progressing  Goal: Rounds/Family Conference  Outcome: Progressing     Problem: Fall Injury Risk  Goal: Absence of Fall and Fall-Related Injury  Outcome: Progressing     Problem: Self-Care Deficit  Goal: Improved Ability to Complete Activities of Daily Living  Outcome: Progressing     Problem: Coping Ineffective (Oncology Care)  Goal: Effective Coping  Outcome: Progressing     Problem: Fatigue (Oncology Care)  Goal: Improved Activity Tolerance  Outcome: Progressing     Problem: Oral Intake Altered (Oncology Care)  Goal: Optimal Oral Intake  Outcome: Progressing     Problem: Oral Mucositis (Oncology Care)  Goal: Improved Oral Mucous Membrane Integrity  Outcome: Progressing     Problem: Pain Acute (Oncology Care)  Goal: Optimal Pain Control  Outcome: Progressing

## 2019-07-10 NOTE — Unmapped (Signed)
UPDATED: 12/24/2017     How to find Mental Health Treatment in [facility]     Suicide & Crisis Hotlines  ?? The Hopeline   ? Call or text: (661)408-0620  ? Online chat: www.hopeline.com  ?? National Hopeline Network: 1-800-SUICIDE / 437-330-1564  ?? National Suicide Prevention Lifeline (609) 840-1927 / (231)507-3755  ? Online chat: www.suicidepreventionlifeline.org     Substance Abuse Treatment  ?? Alcohol/Drug Council of [place]              347-230-8747 - http://www.https://www.schmidt.com/     ?? Substance Abuse and Mental Health Services [facility]) treatment locator              SurvivorMart.com.pt.aspx     ?? [facility]'s Alcohol and Substance Abuse Program (ASAP)              All major health insurance, Medicare, and Medicaid are accepted              2396874596              www.uncmedicalcenter.org/[place]/care-treatment/alcohol-and-substance-abuse/     ?? [facility]: 239 861 4347 - http://freedomhouserecovery.org/     ??  Recovery Response Center: 7608258873     Mobile Crisis in [place]: https://www.hunter.org/ -- search for your county     [facility] DEPARTMENT OF PSYCHIATRY: https://www.ward.com/  ?? [facility]: The Acute Diagnostic and Treatment Clinic (ADTC) is a general adult psychiatric clinic staffed by teams of faculty and resident psychiatrists. For appointments, please call [phone].     ?? [facility] ??? [facility] Child & Adolescent Psychiatry:  To obtain an appointment for your child to be seen in our clinic, please call [phone].     ?? [facility] Comprehensive Cancer Support Program is dedicated to helping patients and their loved ones with cancer treatment, recovery, and survivorship, the CCSP offers programs and services both during and after your treatment.  Call [phone] to make an appointment.     ?? [facility] ??? [facility] Child Psychiatry Outpatient Program, [facility] Psychiatry Pediatric Behavior Clinic, [facility] Faculty Physicians and [facility] Parenting Initiative Clinic: To obtain an appointment at one of these clinics, please call [phone].     ?? [facility] of Excellence for Eating Disorders:  [phone]     ?? [facility] for Women's Mood Disorders:  [phone]           How to Find Mental Health Treatment  If you have health insurance or a Medicare Advantage Plan, look on your insurance card for the phone number or website to access a list of mental health providers who accept your insurance.     If you have Medicaid, Medicare or no insurance, see below for your county's MCO. Call the access number to schedule an appointment.     Medicare.gov can also help you locate mental health providers in your area that accept regular Medicare. (Search for ???behavioral health??? physicians using the Physician Compare Tool.)     For the most up-to-date information, please visit: http://www.price-smith.com/     [facility] Counties served [employer] [address] [place], [place], [place], [place] Crisis Line: [phone]  www.AllianceBHC.org   [employer], [place], [place], [place], [place], [place], [place], [place], [place], [place], [place], [place], [place], [place], [place], [place], [place], [place], Alvie Heidelberg Crisis Line: (785) 105-5313  www.Cardinalinnovations.[facility], [place], [place], [place], [place], [place], [place], [place], [place], [place], [place], Ell[place], [place], [place], [place], [place], [place], [place], [place],  Kathleene Hazel, Sherlon Handing Crisis Line: [phone]  www.VayaHealth.com   [facility] [place], [place], [place], [place], [place], [place], [place], [place], [place], Andrey Campanile Crisis Line: [phone]  www.Eastpointe.net   [facility] [address] Management Ebbie Latus, [place], [place], [place], Corky Crafts Crisis Line: [phone]  www.[email or web address] [place], [place], [place], [place], [place], [place], Wyatt Portela, [place] Crisis Line: [phone]  www.SandhillsCenter.org   [facility] [place], [place], [place], [place], [place], [place], [place], [place]iz[place], [place], [place], [place], [place], [place], [place], [place], [place], [place], [place], [place], [place], [place], [place], [place], [place], [place], [place] Crisis Line: [phone]  www.TrilliumHealthResources.org

## 2019-07-10 NOTE — Unmapped (Signed)
Pt A/O. VSS. Afebrile during shift. Maintaining oxygen saturation on RA. Reports pain in spine/shoulders related to bone mets, prns given as ordered. No falls/injuries during shift. No s/s of DVT, continues heparin for prophylaxis. Remains on CIWA, scoring 0 during shift, pt slept most of the day, ativan ordered PRN and on librium taper. Reports decrease in appetite, encouraged PO intake, ensure & supershake ordered for dinner. Xtandi order d/c'd. WCTM.   Problem: Adult Inpatient Plan of Care  Goal: Plan of Care Review  Outcome: Ongoing - Unchanged  Goal: Patient-Specific Goal (Individualization)  Outcome: Ongoing - Unchanged  Goal: Absence of Hospital-Acquired Illness or Injury  Outcome: Ongoing - Unchanged  Goal: Optimal Comfort and Wellbeing  Outcome: Ongoing - Unchanged  Goal: Readiness for Transition of Care  Outcome: Ongoing - Unchanged  Goal: Rounds/Family Conference  Outcome: Ongoing - Unchanged     Problem: Fall Injury Risk  Goal: Absence of Fall and Fall-Related Injury  Outcome: Ongoing - Unchanged     Problem: Self-Care Deficit  Goal: Improved Ability to Complete Activities of Daily Living  Outcome: Ongoing - Unchanged     Problem: Coping Ineffective (Oncology Care)  Goal: Effective Coping  Outcome: Ongoing - Unchanged     Problem: Fatigue (Oncology Care)  Goal: Improved Activity Tolerance  Outcome: Ongoing - Unchanged     Problem: Oral Intake Altered (Oncology Care)  Goal: Optimal Oral Intake  Outcome: Ongoing - Unchanged     Problem: Oral Mucositis (Oncology Care)  Goal: Improved Oral Mucous Membrane Integrity  Outcome: Ongoing - Unchanged     Problem: Pain Acute (Oncology Care)  Goal: Optimal Pain Control  Outcome: Ongoing - Unchanged

## 2019-07-10 NOTE — Unmapped (Signed)
Social Worker Consult Note    Name:Joseph Phillips  Date of Birth:[birth year]  VWU:[id number]  AOZ:[id number]  Admit Date:07/08/2019      Reason for Consult: Substance Abuse Issues    Pt reports that he is only interested in ETOH treatment for 2-3 days just to get the alcohol out of me. SW educated pt that his hospital stay will act as that place for detoxification. SW inquired if he wanted additional support after leaving the hospital to help maintain his sobriety; he denied that would be needed. SW offered to place resource information in the AVS in case it would be helpful for him. He agreed to that plan; confirmed he has 12th grade education and no difficulty reading. No other SW needs or follow-up indicated at this time.      Mercer Pod, MSW, LCSW, ACM-SW, July 09, 2019 5:27 PM

## 2019-07-10 NOTE — Unmapped (Signed)
Pt Vss, afebrile, and alert and oriented. CIWAs Q4 have scored 0 this shift. Tolerated meds well. Pt requested PRN melatonin w/ Sx relief. Bed low and locked, call bell in reach, and nonskid socks when OOB. Will continue to monitor during this shift.     Problem: Adult Inpatient Plan of Care  Goal: Plan of Care Review  Outcome: Progressing  Goal: Patient-Specific Goal (Individualization)  Outcome: Progressing  Goal: Absence of Hospital-Acquired Illness or Injury  Outcome: Progressing  Goal: Optimal Comfort and Wellbeing  Outcome: Progressing  Goal: Readiness for Transition of Care  Outcome: Progressing  Goal: Rounds/Family Conference  Outcome: Progressing     Problem: Fall Injury Risk  Goal: Absence of Fall and Fall-Related Injury  Outcome: Progressing     Problem: Self-Care Deficit  Goal: Improved Ability to Complete Activities of Daily Living  Outcome: Progressing     Problem: Coping Ineffective (Oncology Care)  Goal: Effective Coping  Outcome: Progressing     Problem: Fatigue (Oncology Care)  Goal: Improved Activity Tolerance  Outcome: Progressing     Problem: Oral Intake Altered (Oncology Care)  Goal: Optimal Oral Intake  Outcome: Progressing     Problem: Oral Mucositis (Oncology Care)  Goal: Improved Oral Mucous Membrane Integrity  Outcome: Progressing     Problem: Pain Acute (Oncology Care)  Goal: Optimal Pain Control  Outcome: Progressing

## 2019-07-11 NOTE — Unmapped (Signed)
Patient left without being seen. See telephone encounter on 7/21.

## 2019-07-12 NOTE — Unmapped (Signed)
Erenest Blank contacted the [employer] requesting to speak with the care team of Joseph Phillips to discuss:    Was given new medications and wants to speak to his care team to make sure he has everything right.    Please contact at [phone].      Check Indicates criteria has been reviewed and confirmed with the patient:    []  Preferred Name   [x]  DOB and/or MR#  [x]  Preferred Contact Method  [x]  Phone Number(s)   []  MyChart     Thank you,   Vernie Ammons  [facility]   [phone]

## 2019-07-12 NOTE — Unmapped (Signed)
CARE MANAGEMENT ENCOUNTER    3rd unsuccessful attempt to reach pt prior to his 4pm appt today with Dr Heide Scales.    Time Spent: 1 minute    Jennell Corner, MSW, [facility]  [facility] Manager- [facility] Internal Medicine  Phone: [phone]  Pager: [phone]

## 2019-07-12 NOTE — Unmapped (Signed)
CARE MANAGEMENT ENCOUNTER    2nd attempt to speak with pt today, no answer/no VM set up.    Time Spent: 1 minute    Jennell Corner, MSW, [facility]  [facility] Manager- [facility] Internal Medicine  Phone: [phone]  Pager: [phone]

## 2019-07-12 NOTE — Unmapped (Signed)
Hi,    Patient Joseph Phillips called requesting a medication refill for the following:    ? Medication: roxicodone  ? Dosage: 5mg   ? Days left of medication: 0  ? Pharmacy: [facility]    The expected turnaround time is 3-4 business days       Check Indicates criteria has been reviewed and confirmed with the patient:    [x]  Preferred Name   [x]  DOB and/or MR#  [x]  Preferred Contact Method  [x]  Phone Number(s)   [x]  Preferred Pharmacy   []  MyChart     Thank you,  [name]  [facility] Communication Center  [phone]

## 2019-07-12 NOTE — Unmapped (Signed)
[name] contacts the clinic today to inquire about the status of his refill request for oxycodone.    It was clarified with [name] that Dr. Claude Manges has yet to review the medication refill request received earlier this morning.  Upon Dr. Claude Manges completing his review and potential approval, it will be completed electronically with prescription being sent to [facility] in [place], [place].    [name] expressed verbal understanding and had no other questions or concerns at this time.    No other actions taken at this time.

## 2019-07-12 NOTE — Unmapped (Signed)
CARE MANAGEMENT ENCOUNTER    LCSW received msg from pt's PCP requesting LCSW to follow up with pt regarding pts interest in inpt rehabilitation for alcohol use. LCSW reviewed pt's chart and he was offered assistance arranging  outpatient or inpt treatment program upon d/c from the hospital, but he declined.     LCSW attempted call with pt twice to offer below information, call got disconnected 1st time, LCSW called again but call was breaking up and it was difficult to communicate with pt. LCSW will try to call pt again later today before his 4pm appt with Dr. Heide Scales.    Pending to offer pt the following information:    ?? Alcoholics Anonymous 24 hour Hotline [place] [id number])   Pt can call and obtain information about their services and local meetings.    ?? [employer] (P:[phone])   1) Pt can call and complete screening over the phone  2) [facility] would then help schedule a Susbtance Use Assessment with a Clinician  3)After assessment, clinician would offer recommendations to pt for  next steps, including linking pt to inpatient rehab services accordingly.    Time Spent: 7 minutes    Jennell Corner, MSW, LCSW  [facility] Manager- [facility] Internal Medicine  Phone: [phone]  Pager: [phone]

## 2019-07-13 MED ORDER — ALBUTEROL SULFATE HFA 90 MCG/ACTUATION AEROSOL INHALER
0 refills | 0 days | Status: CP
Start: 2019-07-13 — End: ?

## 2019-07-13 NOTE — Unmapped (Signed)
Hi,    Patient Wilburn Mylar called requesting a medication refill for the following:    ? Medication: albuterol HFA  ? Dosage: 90 mcg/acuation inhaler  ? Days left of medication: 0  ? [facility]acy[facility]    ? Medication: oxycodone  ? Dosage: 5 mg   ? Days left of medication: 2  ? Pharmacy: [facility]        The expected turnaround time is 3-4 business days       Check Indicates criteria has been reviewed and confirmed with the patient:    []  Preferred Name   []  DOB and/or MR#  []  Preferred Contact Method  []  Phone Number(s)   []  Preferred Pharmacy   []  MyChart     Thank you,  Kelli Hope  [place] [facility]  [phone]

## 2019-07-13 NOTE — Unmapped (Signed)
Message Title: Transitions of Care Phone Call Follow-Up  Transitions Phone Call Follow-Up: Week # 1 after hospital f/u clinic visit   Person spoken with: [name]     Conditions and Symptoms  New problems: Afraid of running out of pain meds has stage #4 bone Ca.. and scared he's gone to run out of medications.   Medications: reviewed med list and the only 2 issues are getting his pain meds and his albuterol at this moment    Post-Acute Services  Home Health Services - Agency name/contact: N/A   DME: N/A                                               Upcoming appointments:Timothy Coralie Carpen, MD -oncology 08/12/19   2wk hosptal F/U-08/04/19 with Max Heide Scales    Barriers: can't get Oxycodone & Albuterol from [facility]. I'm contacting them to see why.  Spoke with Pharmacist at [facility] his opioid RX is completed by his oncologist and usually is only about 10 days worth. I see a note for the refill in chart and I will send in albuterol refill request as well.    Community resource needs: states he has the list given from other appt and  Phone calls  Talked about mychart but Doesn't have acces to Computer or smart phone  Talked to [employer] and thay don't mail out Opioids yet but will be doing this soon.     Advanced Directives: He was mailed a packet after talking about this but has not received yet, plans on making his Brother HCPA      Comments: Did receive coupon for ensure. I called pt back to inform him about info from [facility] and Shared service's.  We also talked about importance of follow up appts and ph calls along with medication adherence     Provided the patient with the following information:  -  Same Day Clinic:  [phone].  8am-5pm M-F.   -  After Hours:  [phone].  A nurse will answer and can help you decide what kind of medication attention you need.  A doctor is also there to help  [facility]:  [phone].  If you are sick, but not injured:  9am-8pm Mon-Sun.  [address], [address], [place], [place] off of [address].  All walk-ins accepted.    [facility] at the [facility]:  [phone].  7am-9pm Mon-Fri; 12pm-5pm [address]  [address] [place] [address].  All walk-ins accepted.        Information forwarded to PCP for review.

## 2019-07-13 NOTE — Unmapped (Signed)
[name] is a 58 yo M with hx of Etoh abuse, stage IV prostate cancer, and two recent admissions within the last month for 1) alcohol induced gastritis/withdrawal on 6/30 and 2) EtOH withdrawal (7/17-7/18).     During the initial hospitalization follow up visit on 7/13 patient expressed interest in community resources, and possible inpatient treatment for his EtOH use disorder. However declined at that time. After his most recent hospitalization, he was provided a short course of librium on 7/18 at discharge. He then arrived at appointment on 07/12/19. Stayed for 5 minutes but became agitated when asked to keep his surgical mask in place. Felt claustrophobic and wanted to leave. Patient was offered a face shield with no surgical mask however elected to then leave without being seen.    Between 7/13 and hospitalization on 7/17 Jennell Corner, the [facility] LCSW, attempted 3x unsuccessfully to reach patient to discuss EtOH use counseling and community resources. Now, in the last 36 hours, Jennell Corner has attempted unsuccessfully four times to reach out to the patient to discuss EtOH use counseling resources. Unfortunately, pt does not have VM set up.    Regarding pt's chronic pain due to metastatic prostate cancer to bone, patient uses fentanyl patch 65mcg/hr + oxycodone 10mg  q4hr which is prescribed through his Oncologist's office (Dr. Claude Manges). Patient's continued concurrent opioid use and EtOH use puts him at high risk of opioid overdose and death. This was discussed with Dr. Leota Jacobsen office who was in agreement    Called patient today to discuss his pain medication plan, continued EtOH use, and his participation in his care. Unfortunately, patient did not answer and does not have VM set up.    Our office will continue to reach out to [name] regarding the above issues in an attempt to provide him with community counseling re his EtOH use disorder, and to establish a safe pain management plan moving forward. Joseph Reznick Jeri Cos, MD PGY-3  Internal Medicine

## 2019-07-13 NOTE — Unmapped (Signed)
CARE MANAGEMENT ENCOUNTER    LCSW had a 4th unsuccessful attempt to reach pt. No answer/no VM set up.    LCSW is mailing pt information about Alcoholics Anonymous and instructions in how he can start process to get into inpatient rehab facility in a letter, as recommended by his PCP yesterday.    Time Spent: 2 minutes    Jennell Corner, MSW, LCSW  [facility] Manager- [facility] Internal Medicine  Phone: [phone]  Pager: [phone]

## 2019-07-14 MED ORDER — OXYCODONE 5 MG TABLET
ORAL_TABLET | ORAL | 0 refills | 5 days | Status: CP | PRN
Start: 2019-07-14 — End: 2019-08-16

## 2019-07-14 NOTE — Unmapped (Signed)
Yes the [phone] is what I've been talking to him on. I think he answers because I'm having to use my home phone right now.. I discussed how dangerous it was to be mixing 2 downers together and that it could cause him death. I hope I got thru to him but it may be better related if you could call him to.

## 2019-07-14 NOTE — Unmapped (Signed)
I talked to patient he states he is ready to proceed with next steps so he can get his pain meds. He said he will answer phone calls.   I have no problem contacting him if you need me to, but he would like to know when and what the next steps are. I think from the way he was talking he thinks he's going to inpt. Rehab.

## 2019-07-14 NOTE — Unmapped (Signed)
As directed by Dr. Claude Manges, I have contacted the patient to review with him the following:    ?? The patient has indicated he is waiting a return phone call to coordinate an inpatient detox program and continues to request a refill of his oxycodone prescription by Dr. Claude Manges.    ?? Jennell Corner,  [place], has sent Joseph Phillips a letter dated 07/13/2019 highlighting that support is available by contacting either [employer] or  Alcoholics Anonymous 24-hour Hotline for [facility].  ?? I have followed up with [employer] who has indicated that Joseph Phillips has not contacted them at this point in time.  ?? Alcoholics Anonymous for [facility] does not keep a log of their calls and is unable to verify whether or not Joseph Phillips reached out for help.    The information listed above has been reviewed by Dr. Claude Manges and the following plan is now in place:    ?? A refill request has been reviewed and approved for oxycodone 5 mg tablets, dispense 50 with 0 refills.  ?? This request was completed electronically and sent to [facility] in [place], [place].    The patient is aware that the medication refill request has been approved and will contact the dispensing pharmacy to coordinate pickup.  In addition, the patient continues to work towards coordinating inpatient detox services.    No other actions taken at this time and Dr. Claude Manges is aware.

## 2019-07-16 NOTE — Unmapped (Signed)
CARE MANAGEMENT ENCOUNTER    LCSW attempted call with pt, but there was no answer and no VM was set up.    Time Spent: 1 minute    Jennell Corner, MSW, [facility]  [facility] Manager- [facility] Internal Medicine  Phone: [phone]  Pager: [phone]

## 2019-07-16 NOTE — Unmapped (Signed)
Pt called to say no one has told him what to do about detox/ rehab. Per Epic notes, SW tried to reach pt multiple times w/o success. Will route to SW to notify of his call.

## 2019-07-17 ENCOUNTER — Emergency Department
Admission: EM | Admit: 2019-07-17 | Discharge: 2019-07-17 | Disposition: A | Discharge status: Home / Self Care | Payer: Medicaid Other | Attending: Emergency Medicine | Admitting: Emergency Medicine

## 2019-07-17 ENCOUNTER — Other Ambulatory Visit: Payer: Self-pay

## 2019-07-17 DIAGNOSIS — I10 Essential (primary) hypertension: Secondary | ICD-10-CM | POA: Diagnosis not present

## 2019-07-17 DIAGNOSIS — Z8546 Personal history of malignant neoplasm of prostate: Secondary | ICD-10-CM | POA: Insufficient documentation

## 2019-07-17 DIAGNOSIS — F1010 Alcohol abuse, uncomplicated: Secondary | ICD-10-CM

## 2019-07-17 DIAGNOSIS — J449 Chronic obstructive pulmonary disease, unspecified: Secondary | ICD-10-CM | POA: Diagnosis not present

## 2019-07-17 DIAGNOSIS — Y908 Blood alcohol level of 240 mg/100 ml or more: Secondary | ICD-10-CM | POA: Diagnosis not present

## 2019-07-17 DIAGNOSIS — F1020 Alcohol dependence, uncomplicated: Secondary | ICD-10-CM | POA: Diagnosis not present

## 2019-07-17 DIAGNOSIS — Z7141 Alcohol abuse counseling and surveillance of alcoholic: Secondary | ICD-10-CM | POA: Diagnosis present

## 2019-07-17 DIAGNOSIS — Z79899 Other long term (current) drug therapy: Secondary | ICD-10-CM | POA: Diagnosis not present

## 2019-07-17 DIAGNOSIS — F17210 Nicotine dependence, cigarettes, uncomplicated: Secondary | ICD-10-CM | POA: Diagnosis not present

## 2019-07-17 DIAGNOSIS — Z046 Encounter for general psychiatric examination, requested by authority: Secondary | ICD-10-CM | POA: Diagnosis not present

## 2019-07-17 LAB — CBC
HCT: 31.6 % — ABNORMAL LOW (ref 39.0–52.0)
Hemoglobin: 11 g/dL — ABNORMAL LOW (ref 13.0–17.0)
MCH: 32.4 pg (ref 26.0–34.0)
MCHC: 34.8 g/dL (ref 30.0–36.0)
MCV: 93.2 fL (ref 80.0–100.0)
Platelets: 271 10*3/uL (ref 150–400)
RBC: 3.39 MIL/uL — ABNORMAL LOW (ref 4.22–5.81)
RDW: 12.1 % (ref 11.5–15.5)
WBC: 7 10*3/uL (ref 4.0–10.5)
nRBC: 0 % (ref 0.0–0.2)

## 2019-07-17 LAB — COMPREHENSIVE METABOLIC PANEL
ALT: 34 U/L (ref 0–44)
AST: 118 U/L — ABNORMAL HIGH (ref 15–41)
Albumin: 4.1 g/dL (ref 3.5–5.0)
Alkaline Phosphatase: 292 U/L — ABNORMAL HIGH (ref 38–126)
Anion gap: 9 (ref 5–15)
BUN: 12 mg/dL (ref 6–20)
CO2: 16 mmol/L — ABNORMAL LOW (ref 22–32)
Calcium: 8.2 mg/dL — ABNORMAL LOW (ref 8.9–10.3)
Chloride: 110 mmol/L (ref 98–111)
Creatinine, Ser: 1 mg/dL (ref 0.61–1.24)
GFR calc Af Amer: >60 mL/min (ref >60)
GFR calc non Af Amer: >60 mL/min (ref >60)
Glucose, Bld: 84 mg/dL (ref 70–99)
Potassium: 4 mmol/L (ref 3.5–5.1)
Sodium: 135 mmol/L (ref 135–145)
Total Bilirubin: 0.5 mg/dL (ref 0.3–1.2)
Total Protein: 8 g/dL (ref 6.5–8.1)

## 2019-07-17 LAB — ETHANOL: Alcohol, Ethyl (B): 244 mg/dL — ABNORMAL HIGH (ref <10)

## 2019-07-17 MED ORDER — THIAMINE HCL 100 MG/ML IJ SOLN
Freq: Once | INTRAVENOUS | Status: AC
  Administered 2019-07-17: 14:00:00 via INTRAVENOUS
  Filled 2019-07-17: qty 1000

## 2019-07-17 NOTE — BH Assessment (Signed)
TTS is available via tele. Please call [phone].

## 2019-07-17 NOTE — BH Assessment (Signed)
Tele Assessment Note   Patient Name: Saketh Daubert. MRN: [id number] Referring Physician: EDP McShane Location of Patient: [facility] ED19HA - interview room Location of Provider: Ridgetop J Ashworth Jr. is an 58 y.o. male who presented to [facility] ED voluntarily requesting detoxification treatment.   TTS assessed patient via tele.  Upon assessment, patient was dressed in street clothes, alert, oriented, in the interview room.  He denied current or historical SI, HI, or psychosis.  He primarily is interested in detox for alcohol use. stating "I got so I drink every day and I've been looking forward to it, and I'm tired of it." He reports daily alcohol use for the past year.  Previously, he was sober for about 10 years and started drinking again following the death of his father and brother.  Currently daily use is about two pints of wine plus one beer daily.  Patient has a history of alcohol withdrawal seizures and also reports getting the "shakes."  There is periodic cannabis use - patient reports he has stage 4 bone cancer and requires cannabis to enhance his appetite.  Last use of cocaine was about 10 years ago.    Patient denied a history of sexual, physical, or verbal abuse and feels safe in his home.  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   Patient denies general feelings of depression but reports "I sometimes feel bad about myself because I want to quit drinking, and I can't."  Patient is unemployed and receives disability income.  [facility]S telephoned [facility] while with the patient to inquire of bed availability - there were not at the time. TTS and patient discussed additional options.  Patient requests [facility] as it is local and expressed understanding of the need to telephone on Monday to schedule an assessment with hopes they will have bed  availability. He understands that he must have a BAC less than 0.20 at the time of admission to [facility].     Diagnosis: Alcohol Use Disorder, Severe  Past Medical History:  Past Medical History:  Diagnosis Date  . Cancer [facility])    prostate ca  . COPD (chronic obstructive pulmonary disease) ([place])   . Hypertension   . Pancreatitis     Past Surgical History:  Procedure Laterality Date  . PROSTATE SURGERY      Family History:  Family History  Problem Relation Age of Onset  . Cancer Father        bladder    Social History:  reports that he has been smoking cigarettes. He has been smoking about 0.25 packs per day. He has never used smokeless tobacco. He reports current alcohol use. He reports that he does not use drugs.  Additional Social History:  Alcohol / Drug Use Pain Medications: See PTA Prescriptions: See PTA Over the Counter: See PTA History of alcohol / drug use?: Yes Longest period of sobriety (when/how long): 10 years Withdrawal Symptoms: Seizures, Fever / Chills, Patient aware of relationship between substance abuse and physical/medical complications, Tremors Date of most recent seizure: "a long time ago." Substance #1 Name of Substance 1: Alcohol 1 - Age of First Use: [age] 1 - Amount (size/oz): 2 pints of wine or more 1 - Frequency: daily use 1 - Duration: for the past year; p 1 - Last Use / Amount: this morning, unknown amount Substance #2 Name  of Substance 2: cannabis 2 - Age of First Use: high school? 2 - Last Use / Amount: in the last week Substance #3 Name of Substance 3: cocaine 3 - Last Use / Amount: 10 years ago  CIWA: CIWA-Ar BP: 127/86 Pulse Rate: 95 Nausea and Vomiting: no nausea and no vomiting Tactile Disturbances: none Tremor: no tremor Auditory Disturbances: not present Paroxysmal Sweats: no sweat visible Visual Disturbances: not present Anxiety: no anxiety, at ease Headache, Fullness in Head: none present Agitation: normal  activity Orientation and Clouding of Sensorium: oriented and can do serial additions CIWA-Ar Total: 0 COWS:    Allergies:  Allergies  Allergen Reactions  . Penicillins     Home Medications: (Not in a hospital admission)   OB/GYN Status:  No LMP for male patient.  General Assessment Data Location of Assessment: [facility] ED TTS Assessment: In system Is this a Tele or Face-to-Face Assessment?: Tele Assessment Is this an Initial Assessment or a Re-assessment for this encounter?: Initial Assessment Patient Accompanied by:: N/A Language Other than English: No Living Arrangements: Other (Comment)(WITH MOTHER) What gender do you identify as?: Male Marital status: Single Pregnancy Status: No Living Arrangements: Parent Can pt return to current living arrangement?: Yes Admission Status: Voluntary Is patient capable of signing voluntary admission?: Yes Referral Source: Self/Family/Friend Insurance type: MEDICAID  Medical Screening Exam ([place]) Medical Exam completed: Yes  Crisis Care Plan Living Arrangements: Parent Legal Guardian: (Self) Name of Psychiatrist: none Name of Therapist: none  Education Status Is patient currently in school?: No Is the patient employed, unemployed or receiving disability?: Receiving disability income  Risk to self with the past 6 months Suicidal Ideation: No Has patient been a risk to self within the past 6 months prior to admission? : No Suicidal Intent: No Has patient had any suicidal intent within the past 6 months prior to admission? : No Is patient at risk for suicide?: No Suicidal Plan?: No Has patient had any suicidal plan within the past 6 months prior to admission? : No Access to Means: No What has been your use of drugs/alcohol within the last 12 months?: daily alcohol use; periodic cannabis use Previous Attempts/Gestures: No How many times?: 0 Other Self Harm Risks: none noted Triggers for Past Attempts: None  known Intentional Self Injurious Behavior: None Family Suicide History: No Recent stressful life event(s): Loss (Comment)(death of father and brother in 2019) Persecutory voices/beliefs?: No Depression: No Depression Symptoms: Feeling worthless/self pity Substance abuse history and/or treatment for substance abuse?: Yes Suicide prevention information given to non-admitted patients: Not applicable  Risk to Others within the past 6 months Homicidal Ideation: No Does patient have any lifetime risk of violence toward others beyond the six months prior to admission? : No Thoughts of Harm to Others: No Current Homicidal Intent: No Current Homicidal Plan: No Access to Homicidal Means: No Describe Access to Homicidal Means: none noted Identified Victim: none noted History of harm to others?: No Assessment of Violence: None Noted Violent Behavior Description: none noted Does patient have access to weapons?: No Criminal Charges Pending?: No Does patient have a court date: No Is patient on probation?: No  Psychosis Hallucinations: None noted Delusions: None noted  Mental Status Report Appearance/Hygiene: Unremarkable Eye Contact: Good Motor Activity: Freedom of movement, Unremarkable Speech: Logical/coherent Level of Consciousness: Alert Mood: Worthless, low self-esteem Affect: Appropriate to circumstance, Blunted Anxiety Level: Minimal Thought Processes: Coherent, Relevant Judgement: Partial Orientation: Person, Place, Time, Situation, Appropriate for developmental age Obsessive Compulsive Thoughts/Behaviors: None  Cognitive Functioning Concentration: Normal Memory: Recent Intact, Remote Intact Is patient IDD: No Insight: Fair Impulse Control: Poor Appetite: Poor Have you had any weight changes? : No Change(varied due to cancer treatment) Sleep: Unable to Assess Total Hours of Sleep: 5 Vegetative Symptoms: None  ADLScreening [facility] Assessment Services) Patient's  cognitive ability adequate to safely complete daily activities?: Yes Patient able to express need for assistance with ADLs?: Yes Independently performs ADLs?: Yes (appropriate for developmental age)  Prior Inpatient Therapy Prior Inpatient Therapy: Yes Prior Therapy Dates: age [age] Prior Therapy Facilty/Provider(s): unknown Reason for Treatment: substance use  Prior Outpatient Therapy Prior Outpatient Therapy: No Does patient have an ACCT team?: No Does patient have Intensive In-House Services?  : No Does patient have [facility] services? : No Does patient have P4CC services?: No  ADL Screening (condition at time of admission) Patient's cognitive ability adequate to safely complete daily activities?: Yes Is the patient deaf or have difficulty hearing?: No Does the patient have difficulty seeing, even when wearing glasses/contacts?: No Does the patient have difficulty concentrating, remembering, or making decisions?: No Patient able to express need for assistance with ADLs?: Yes Does the patient have difficulty dressing or bathing?: No Independently performs ADLs?: Yes (appropriate for developmental age) Does the patient have difficulty walking or climbing stairs?: No Weakness of Legs: None Weakness of Arms/Hands: None  Home Assistive Devices/Equipment Home Assistive Devices/Equipment: None  Therapy Consults (therapy consults require a physician order) PT Evaluation Needed: No OT Evalulation Needed: No SLP Evaluation Needed: No Abuse/Neglect Assessment (Assessment to be complete while patient is alone) Abuse/Neglect Assessment Can Be Completed: Yes Physical Abuse: Denies Verbal Abuse: Denies Sexual Abuse: Denies Exploitation of patient/patient's resources: Denies Self-Neglect: Denies   Consults Spiritual Care Consult Needed: No Social Work Consult Needed: No Regulatory affairs officer (For Healthcare) Does Patient Have a Medical Advance Directive?: No          Disposition:   Disposition Initial Assessment Completed for this Encounter: Yes Patient referred to: [facility]  This service was provided via telemedicine using a 2-way, interactive audio and Radiographer, therapeutic.  Names of all persons participating in this telemedicine service and their role in this encounter. TTS and patient were present for the duration of the assessment.  It was set up by nurse tech [place].   [place] 07/17/2019 2:22 PM

## 2019-07-17 NOTE — ED Notes (Signed)
Pt given lunch tray.

## 2019-07-17 NOTE — ED Provider Notes (Addendum)
[facility] Emergency Department Provider Note  ____________________________________________   I have reviewed the triage vital signs and the nursing notes. Where available I have reviewed prior notes and, if possible and indicated, outside hospital notes.   Patient seen and evaluated during the coronavirus epidemic during a time with low staffing  Patient seen for the symptoms described in the history of present illness. She was evaluated in the context of the global COVID-19 pandemic, which necessitated consideration that the patient might be at risk for infection with the SARS-CoV-2 virus that causes COVID-19. Institutional protocols and algorithms that pertain to the evaluation of patients at risk for COVID-19 are in a state of rapid change based on information released by regulatory bodies including the CDC and federal and state organizations. These policies and algorithms were followed during the patient's care in the ED.    HISTORY  Chief Complaint Alcohol Problem    HPI Alec Hart. is a 58 y.o. male with a history of COPD pancreatitis and EtOH abuse, states he has had withdrawal symptoms in past presents today complaining of EtOH abuse and wanting to get rehab.  Denies drug abuse.  EtOH is positive at 244.  Last drink this morning.  Has had no withdrawal symptoms.  Denies any fever chills nausea vomiting diarrhea.  No headache or fall.  No closed head injury.  No focal discomfort.  Is here for rehab only.     Past Medical History:  Diagnosis Date  . Cancer [facility])    prostate ca  . COPD (chronic obstructive pulmonary disease) ([place])   . Hypertension   . Pancreatitis     Patient Active Problem List   Diagnosis Date Noted  . Sepsis ([place]) 01/07/2019    Past Surgical History:  Procedure Laterality Date  . PROSTATE SURGERY      Prior to Admission medications   Medication Sig Start Date End Date Taking? Authorizing Provider  albuterol (PROVENTIL  HFA;VENTOLIN HFA) 108 (90 Base) MCG/ACT inhaler Inhale 2 puffs into the lungs every 6 (six) hours as needed for wheezing. 09/26/18   [provider]  b complex vitamins capsule Take 1 capsule by mouth daily.    [provider]  busPIRone (BUSPAR) 5 MG tablet Take 5 mg by mouth 3 (three) times daily. 03/26/18   [provider]  dexamethasone (DECADRON) 4 MG tablet Take 8 mg by mouth 2 (two) times daily. 07/15/18   [provider]  lidocaine (XYLOCAINE) 2 % solution Use as directed 15 mLs in the mouth or throat every 4 (four) hours as needed for mouth pain.    [provider]  magic mouthwash SOLN Take 10 mLs by mouth 4 (four) times daily as needed. 11/23/18   [provider]  magnesium oxide (MAG-OX) 400 MG tablet Take 400 mg by mouth 2 (two) times daily. 09/09/18   [provider]  naproxen sodium (ALEVE) 220 MG tablet Take 220 mg by mouth 2 (two) times daily as needed (pain).    [provider]  omeprazole (PRILOSEC) 20 MG capsule Take 40 mg by mouth daily. 05/19/18   [provider]  oxyCODONE (OXY IR/ROXICODONE) 5 MG immediate release tablet Take 5 mg by mouth every 6 (six) hours as needed for pain. 12/11/18   [provider]  Pancrelipase, Lip-Prot-Amyl, (CREON) 24000-76000 units CPEP Take 24,000-76,000 Units by mouth 3 (three) times daily. Take 48,000 units of lipase by mouth three times a day with a meal. Take an  additional one capsule with snacks.    [provider]  potassium chloride SA (K-DUR,KLOR-CON) 20 MEQ tablet Take 60 mEq by mouth 2 (two) times daily. 11/16/18   [provider]  tiotropium (SPIRIVA) 18 MCG inhalation capsule Place 1 capsule into inhaler and inhale daily. 11/30/18   [provider]  traZODone (DESYREL) 50 MG tablet Take 50 mg by mouth at bedtime as needed for sleep. 11/16/18   [provider]  triamterene-hydrochlorothiazide (MAXZIDE-25) 37.5-25 MG tablet  Take 1 tablet by mouth daily.    [provider]  valACYclovir (VALTREX) 500 MG tablet Take 500 mg by mouth daily. 11/17/18   [provider]    Allergies Penicillins  Family History  Problem Relation Age of Onset  . Cancer Father        bladder    Social History Social History   Tobacco Use  . Smoking status: Current Some Day Smoker    Packs/day: 0.25    Types: Cigarettes  . Smokeless tobacco: Never Used  Substance Use Topics  . Alcohol use: Yes    Comment: occasional  . Drug use: No    Review of Systems Constitutional: No fever/chills Eyes: No visual changes. ENT: No sore throat. No stiff neck no neck pain Cardiovascular: Denies chest pain. Respiratory: Denies shortness of breath. Gastrointestinal:   no vomiting.  No diarrhea.  No constipation. Genitourinary: Negative for dysuria. Musculoskeletal: Negative lower extremity swelling Skin: Negative for rash. Neurological: Negative for severe headaches, focal weakness or numbness.   ____________________________________________   PHYSICAL EXAM:  VITAL SIGNS: ED Triage Vitals [07/17/19 1131]  Enc Vitals Group     BP 127/86     Pulse Rate 95     Resp 14     Temp 98.7 F (37.1 C)     Temp Source Oral     SpO2 100 %     Weight 163 lb (73.9 kg)     Height      Head Circumference      Peak Flow      Pain Score 7     Pain Loc      Pain Edu?      Excl. in [place]?     Constitutional: Alert and oriented. Well appearing and in no acute distress. Eyes: Conjunctivae are normal Head: Atraumatic HEENT: No congestion/rhinnorhea. Mucous membranes are moist.  Oropharynx non-erythematous Neck:   Nontender with no meningismus, no masses, no stridor Cardiovascular: Normal rate, regular rhythm. Grossly normal heart sounds.  Good peripheral circulation. Respiratory: Normal respiratory effort.  No retractions. Lungs CTAB. Abdominal: Soft and nontender. No distention. No guarding no rebound Back:  There is  no focal tenderness or step off.  there is no midline tenderness there are no lesions noted. there is no CVA tenderness Musculoskeletal: No lower extremity tenderness, no upper extremity tenderness. No joint effusions, no DVT signs strong distal pulses no edema Neurologic:  Normal speech and language. No gross focal neurologic deficits are appreciated.  Skin:  Skin is warm, dry and intact. No rash noted. Psychiatric: Mood and affect are normal. Speech and behavior are normal.  ____________________________________________   LABS (all labs ordered are listed, but only abnormal results are displayed)  Labs Reviewed  COMPREHENSIVE METABOLIC PANEL - Abnormal; Notable for the following components:      Result Value   CO2 16 (*)    Calcium 8.2 (*)    AST 118 (*)    Alkaline Phosphatase 292 (*)    All  other components within normal limits  ETHANOL - Abnormal; Notable for the following components:   Alcohol, Ethyl (B) 244 (*)    All other components within normal limits  CBC - Abnormal; Notable for the following components:   RBC 3.39 (*)    Hemoglobin 11.0 (*)    HCT 31.6 (*)    All other components within normal limits  URINE DRUG SCREEN, QUALITATIVE ([facility] ONLY)    Pertinent labs  results that were available during my care of the patient were reviewed by me and considered in my medical decision making (see chart for details). ____________________________________________  EKG  I personally interpreted any EKGs ordered by me or triage  ____________________________________________  RADIOLOGY  Pertinent labs & imaging results that were available during my care of the patient were reviewed by me and considered in my medical decision making (see chart for details). If possible, patient and/or family made aware of any abnormal findings.  No results found. ____________________________________________    PROCEDURES  Procedure(s) performed: None  Procedures  Critical Care  performed: None  ____________________________________________   INITIAL IMPRESSION / ASSESSMENT AND PLAN / ED COURSE  Pertinent labs & imaging results that were available during my care of the patient were reviewed by me and considered in my medical decision making (see chart for details).   Patient here with a history of EtOH abuse complaining of alcohol abuse wanting to be referred to rehab we are not an inpatient rehabilitation facility and obviously we cannot here him of this.  However, we will start him on thiamine and folate and ask our TTS consultants to see him.  ----------------------------------------- 2:30 PM on 07/17/2019 -----------------------------------------  Patient is awake and alert he is not driving he is eating he is drinking he is walking he wants to go home, he has been evaluated by TTS and given referral for outpatient care which is what he wants.  He does not want to stay any further.  No further intervention indicated at this time.  Patient is eager to go.  Return precautions were given and understood.   ____________________________________________   FINAL CLINICAL IMPRESSION(S) / ED DIAGNOSES  Final diagnoses:  None      This chart was dictated using voice recognition software.  Despite best efforts to proofread,  errors can occur which can change meaning.      Schuyler Amor, MD 07/17/19 1316    Schuyler Amor, MD 07/17/19 [phone]

## 2019-07-17 NOTE — ED Triage Notes (Signed)
Pt presents via POV c/o ETOH abuse wanting detox. Last drink apx 1 hr PTA. Reports hx seizures related to withdraw.

## 2019-07-17 NOTE — ED Notes (Signed)
AAOx3.  Skin warm and dry. Ambulates with easy and steady gait. DC home

## 2019-07-17 NOTE — ED Notes (Signed)
Pt in interview room to speak with TTS.

## 2019-07-17 NOTE — BH Assessment (Signed)
[facility] has no beds for males who need detox. At the beginning of the week, the patient may telephone and schedule an appointment for evaluation.  In order to be admitted, his BAC must be less than 0.20.   [facility], [place] [address] [phone]  [facility] [place] offers non-hospital detoxification, facility-based crisis services, residential services to people with substance use disorders, and residential services to people with mental illness.

## 2019-07-19 ENCOUNTER — Ambulatory Visit: Admit: 2019-07-19 | Discharge: 2019-07-22 | Payer: MEDICAID

## 2019-07-19 LAB — URINALYSIS WITH CULTURE REFLEX
BACTERIA: NONE SEEN /HPF
BILIRUBIN UA: NEGATIVE
BLOOD UA: NEGATIVE
GLUCOSE UA: NEGATIVE
LEUKOCYTE ESTERASE UA: NEGATIVE
NITRITE UA: NEGATIVE
PH UA: 5.5 (ref 5.0–9.0)
PROTEIN UA: NEGATIVE
RBC UA: 0 /HPF (ref <3)
SPECIFIC GRAVITY UA: 1.025 (ref 1.005–1.040)
SQUAMOUS EPITHELIAL: 3 /HPF (ref 0–5)
UROBILINOGEN UA: 0.2

## 2019-07-19 LAB — TOXICOLOGY SCREEN, URINE
BARBITURATE SCREEN URINE: <200
METHADONE SCREEN, URINE: <300

## 2019-07-19 LAB — COMPREHENSIVE METABOLIC PANEL
ALBUMIN: 4.4 g/dL (ref 3.5–5.0)
ALKALINE PHOSPHATASE: 292 U/L — ABNORMAL HIGH (ref 38–126)
ALT (SGPT): 29 U/L (ref <50)
ANION GAP: 13 mmol/L (ref 7–15)
AST (SGOT): 77 U/L — ABNORMAL HIGH (ref 19–55)
BLOOD UREA NITROGEN: 14 mg/dL (ref 7–21)
BUN / CREAT RATIO: 15
CALCIUM: 9.1 mg/dL (ref 8.5–10.2)
CHLORIDE: 113 mmol/L — ABNORMAL HIGH (ref 98–107)
CO2: 14 mmol/L — ABNORMAL LOW (ref 22.0–30.0)
CREATININE: 0.93 mg/dL (ref 0.70–1.30)
EGFR CKD-EPI AA MALE: >90 mL/min/{1.73_m2} (ref >=60)
GLUCOSE RANDOM: 87 mg/dL (ref 70–179)
PROTEIN TOTAL: 7.7 g/dL (ref 6.5–8.3)
SODIUM: 140 mmol/L (ref 135–145)

## 2019-07-19 LAB — CBC W/ AUTO DIFF
BASOPHILS ABSOLUTE COUNT: 0.1 10*9/L (ref 0.0–0.1)
BASOPHILS RELATIVE PERCENT: 1 %
EOSINOPHILS ABSOLUTE COUNT: 0.1 10*9/L (ref 0.0–0.4)
EOSINOPHILS RELATIVE PERCENT: 1.1 %
HEMATOCRIT: 31.9 % — ABNORMAL LOW (ref 41.0–53.0)
HEMOGLOBIN: 10.8 g/dL — ABNORMAL LOW (ref 13.5–17.5)
LARGE UNSTAINED CELLS: 3 % (ref 0–4)
LYMPHOCYTES ABSOLUTE COUNT: 2.5 10*9/L (ref 1.5–5.0)
LYMPHOCYTES RELATIVE PERCENT: 33.3 %
MEAN CORPUSCULAR HEMOGLOBIN CONC: 33.8 g/dL (ref 31.0–37.0)
MEAN CORPUSCULAR HEMOGLOBIN: 33.5 pg (ref 26.0–34.0)
MEAN CORPUSCULAR VOLUME: 99 fL (ref 80.0–100.0)
MEAN PLATELET VOLUME: 6.8 fL — ABNORMAL LOW (ref 7.0–10.0)
MONOCYTES RELATIVE PERCENT: 5.9 %
NEUTROPHILS ABSOLUTE COUNT: 4.1 10*9/L (ref 2.0–7.5)
NEUTROPHILS RELATIVE PERCENT: 55.3 %
PLATELET COUNT: 243 10*9/L (ref 150–440)
RED BLOOD CELL COUNT: 3.22 10*12/L — ABNORMAL LOW (ref 4.50–5.90)

## 2019-07-19 LAB — ETHANOL: Ethanol:MCnc:Pt:Ser/Plas:Qn:GC: 195 — ABNORMAL HIGH

## 2019-07-19 LAB — AST (SGOT): Aspartate aminotransferase:CCnc:Pt:Ser/Plas:Qn:: 77 — ABNORMAL HIGH

## 2019-07-19 LAB — LIPASE
LIPASE: 132 U/L (ref 44–232)
Triacylglycerol lipase:CCnc:Pt:Ser/Plas:Qn:: 132

## 2019-07-19 LAB — AMPHETAMINE SCREEN URINE: Lab: <500

## 2019-07-19 LAB — BASOPHILS RELATIVE PERCENT: Lab: 1

## 2019-07-19 LAB — PH UA: Lab: 5.5

## 2019-07-19 NOTE — Unmapped (Signed)
Pt states he wants help to detox from alcohol. Last drink was this am; unable to state how much he drinks; +marijuana for bone cancer. Pt states he was just here (2 weeks ago) for alcohol withdrawal, stayed for 3 days and was dc'd; went to [facility] and was told there were no open beds.

## 2019-07-19 NOTE — Unmapped (Signed)
[facility]  Emergency Department Provider Note    ED Clinical Impression     Final diagnoses:   Alcohol withdrawal syndrome without complication (CMS-HCC) (Primary)       Initial Impression, ED Course, Assessment and Plan     Impression  58 y.o. male presenting voluntarily for EtOH detox.  Last drink this morning, currently with very mild tremors. Patient with history of complicated withdrawal (seizures) and has required ICU admission in the past for detox.     BP 137/84  - Pulse 111  - Temp 36.8 ??C (98.2 ??F)  - Resp 18  - Ht 172.7 cm (5' 8)  - Wt 73.5 kg (162 lb)  - SpO2 98%  - BMI 24.63 kg/m??     Patient is tachy to 111 bpm. Otherwise VSS and HDS. Well-appearing. No clinical signs suggesting severe systemic illness. Clinical exam as documented below, but briefly notable for very mild tremors, no asterixis, no fluid wave, no scleral icterus, no nystagmus. A&Ox3. TTP to bilateral scapula and midline thoracic region which is unchanged from baseline according to patient, consistent with his known bone cancer.     Plan for detox lab set and EKG. Will place on CIWA protocol, thiamine, and folic acid. Anticipate admission for facilitate detox. This is the patient's 4th presentation to the ED requesting detox help in July 2020.    3:21 PM   EtOH level 195mg /dL. He has had EtOH labs reading higher in the past prior to presenting for detox. Utox significant for >300 opiate, >20 cannibinoid, and >200 benzos.  EKG unchanged from his most recent, no evidence of ischemia.  CBC and CMP baseline for patient. Lipase WNL.  UA is the only outstanding pending lab.  Patient has not required any PRN Ativan per CIWA protocol thus far. Given the patient's history of complicated withdrawal requiring ICU admission in the past, I do believe he will require inpatient detox.  MAO paged for admission.    Additional Medical Decision Making     I have reviewed the vital signs and the nursing notes. Labs and radiology results that were available during my care of the patient were independently reviewed by me and considered in my medical decision making.     I staffed the case with the ED attending, Dr. Beryl Meager.  I independently visualized the EKG tracing.   I reviewed the patient's prior medical records.   I discussed the case with the admitting provider.        History     Chief Complaint  Withdrawal - Alcohol      HPI   Joseph Phillips is a 58 y.o. male with a PMH of EtOH abuse, polysubstance abuse, seizures, COPD, HTN, prostate cancer and bone cancer s/p radiation therapy presenting for EtOH detox. Last drink was 2 cups of wine this morning. Patient reports that he has been drinking daily, states that he drinks wine and ~2 beers per day. He denies drinking liquor. He reports that he feels somewhat tremulous, otherwise asymptomatic. He reports that he has been seeking help for ~1 month. He denies any withdrawal symptoms, lightheadedness, nausea, vomiting, seizures, loc. No fevers, chills, chest pain, abdominal pain, shortness of breath, abdominal pain, new back pain, nausea, vomiting, diarrhea. No SI/HI/AVH. Patient reports using marijuana for appetite, smokes every few days. He endorses tobacco use, smokes daily. No illicit drug use.     Patient has distant hx of complicated withdrawal with seizures. Patient with longstanding hx of EtOH abuse. Patient  has attempted detox and sobriety in the past, longest period ~10 years. He most recently relapsed in the past 2 years in the setting of both of his brother and father dying within the same year. Patient most recently presented to [facility] ED on 7/16 for detox, was intoxicated with EtOH level of 160mg /dL. He has remote history of complicated withdrawals/seizure. Patient was admitted and started on CIWA with oral Librium, and discharged 2 days later with short course of Librium. He was instructed and recommended to present to local AA meetings as he reportedly declined any formal inpatient or outpatient follow up plans, though patient reports that he attempted to be admitted to a facility in [place] today but was told to present to the ED because they had no available beds.     Past Medical History:   Diagnosis Date   ??? Alcohol abuse    ??? COPD (chronic obstructive pulmonary disease) (CMS-HCC)    ??? Hx of radiation therapy    ??? Hypertension    ??? Polysubstance abuse (CMS-HCC)    ??? Prostate cancer (CMS-HCC)    ??? Seizures (CMS-HCC)        Patient Active Problem List   Diagnosis   ??? 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 Acute pancreatitis   ??? Depressive disorder   ??? Reflux esophagitis   ??? Encounter for long-term (current) use of other medications   ??? Pain medication agreement broken   ??? Do not give narcotics   ??? PPD screening test   ??? Tobacco use disorder   ??? Prostate cancer (CMS-HCC)   ??? Hx of radiation therapy   ??? Healthcare maintenance   ??? Sleep difficulties   ??? Essential hypertension   ??? Anxiety   ??? Hypokalemia   ??? Hypomagnesemia   ??? Herpes zoster involving sacral dermatome   ??? Stomatitis and mucositis   ??? Odynophagia   ??? Mucositis due to chemotherapy   ??? Chest pain, mid sternal   ??? Acute midline thoracic back pain   ??? Hyperkalemia   ??? COPD (chronic obstructive pulmonary disease) (CMS-HCC)   ??? Chronic pancreatitis (CMS-HCC)   ??? 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 Cough   ??? Fever and chills   ??? Acute alcoholic gastritis without hemorrhage   ??? Alcohol withdrawal syndrome without complication (CMS-HCC)       Past Surgical History:   Procedure Laterality Date   ??? IR INSERT PORT AGE GREATER THAN 5 YRS  07/15/2018    IR INSERT PORT AGE GREATER THAN 5 YRS 07/15/2018 Carolin Coy, MD IMG [facility]   ??? PR COLSC FLX W/RMVL OF TUMOR POLYP LESION SNARE TQ N/A 08/16/2015    Procedure: COLONOSCOPY FLEX; W/REMOV TUMOR/LES BY SNARE;  Surgeon: Gwen Pounds, MD;  Location: [facility];  Service: Gastroenterology   ??? PR COLSC FLX WITH DIRECTED SUBMUCOSAL NJX ANY SBST N/A 08/16/2015    Procedure: COLONOSCOPY, FLEXIBLE, PROXIMAL TO SPLENIC FLEXURE; WITH DIRECTED SUBMUCOSAL INJECTION(S), ANY SUBSTANCE;  Surgeon: Gwen Pounds, MD;  Location: [facility];  Service: Gastroenterology   ??? PROSTATE SURGERY         No current facility-administered medications for this encounter.     Current Outpatient Medications:   ???  albuterol HFA 90 mcg/actuation inhaler, INHALE 2 PUFFS EVERY 6 HOURS AS NEEDED FOR WHEEZING, Disp: 8.5 g, Rfl: 0  ???  amLODIPine (NORVASC) 10 MG tablet, Take 1 tablet (10 mg total)  by mouth daily., Disp: 30 tablet, Rfl: 11  ???  busPIRone (BUSPAR) 5 MG tablet, TAKE 1 TABLET BY MOUTH THREE TIMES A DAY, Disp: 270 tablet, Rfl: 3  ???  calcium carbonate 650 mg calcium (1,625 mg) tablet, Take 1 tablet (650 mg of elem calcium total) by mouth daily., Disp: 30 tablet, Rfl: 11  ???  chlordiazePOXIDE (LIBRIUM) 25 MG capsule, 1 tab this afternoon, 1 this evening, every 8 hours tomorrow and stop, Disp: 5 capsule, Rfl: 0  ???  diclofenac sodium (VOLTAREN) 1 % gel, Apply 2 g topically Four (4) times a day. Any area of pain, Disp: 200 g, Rfl: 5  ???  diphenhydrAMINE (BENADRYL) 25 mg tablet, Take 0.5 tablets (12.5 mg total) by mouth every six (6) hours as needed for itching., Disp: 30 capsule, Rfl: 0  ???  diphenhydramine HCl (MAGIC MOUTHWASH ORAL) suspension, Take 10 mL by mouth 4 (four) times a day as needed., Disp: 120 mL, Rfl: 1  ???  enzalutamide (XTANDI) 40 mg capsule, Take 4 capsules (160 mg total) by mouth daily., Disp: 120 capsule, Rfl: 5  ???  famotidine (PEPCID) 20 MG tablet, Take 1 tablet (20 mg total) by mouth two (2) times a day as needed for heartburn., Disp: 60 tablet, Rfl: 0  ???  fentaNYL (DURAGESIC) 25 mcg/hr patch, Place 1 patch on the skin every third day., Disp: 10 patch, Rfl: 0  ???  megestroL (MEGACE) 400 mg/10 mL (40 mg/mL) suspension, Take 10 mL (400 mg total) by mouth Two (2) times a day., Disp: 480 mL, Rfl: 12  ???  melatonin 3 mg Tab, TK 1 T PO HS PRN, Disp: , Rfl:   ???  MULTIVITAMIN 50 PLUS Tab, TK 1 T PO D, Disp: , Rfl:   ???  omeprazole (PRILOSEC) 20 MG capsule, Take 2 capsules (40 mg total) by mouth Two (2) times a day., Disp: 120 capsule, Rfl: 2  ???  oxyCODONE (ROXICODONE) 5 MG immediate release tablet, Take 2 tablets (10 mg total) by mouth every four (4) hours as needed for pain., Disp: 50 tablet, Rfl: 0  ???  pancrelipase, Lip-Prot-Amyl, (CREON) 24,000-76,000 -120,000 unit CpDR delayed release capsule, Take 48,000 units of lipase by mouth Three (3) times a day with a meal. Take an additional 1 capsule with snacks., Disp: , Rfl:   ???  prochlorperazine (COMPAZINE) 10 MG tablet, Take 1 tablet (10 mg total) by mouth every eight (8) hours as needed., Disp: 30 tablet, Rfl: 1  ???  salmeteroL (SEREVENT) 50 mcg/dose diskus inhaler, Inhale 1 puff Two (2) times a day., Disp: 1 Inhaler, Rfl: 12  ???  tiotropium (SPIRIVA WITH HANDIHALER) 18 mcg inhalation capsule, Place 1 capsule (18 mcg total) into inhaler and inhale once daily., Disp: 90 capsule, Rfl: 3  ???  traZODone (DESYREL) 50 MG tablet, Take 1 tablet (50 mg total) by mouth nightly as needed for sleep., Disp: 90 tablet, Rfl: 3  ???  valACYclovir (VALTREX) 500 MG tablet, Take 1 tablet (500 mg total) by mouth daily., Disp: 90 tablet, Rfl: 3    Allergies  Penicillins and Tramadol    Family History   Problem Relation Age of Onset   ??? Heart disease Mother    ??? Diabetes Mother    ??? Diabetes Father    ??? Melanoma Neg Hx    ??? Basal cell carcinoma Neg Hx    ??? Squamous cell carcinoma Neg Hx        Social History  Social History  Tobacco Use   ??? Smoking status: Current Every Day Smoker     Packs/day: 0.25     Years: 40.00     Pack years: 10.00     Types: Cigarettes     Start date: 05/09/1994   ??? Smokeless tobacco: Never Used   ??? Tobacco comment: Declined NRT and declined referral to [place] [facility].    Substance Use Topics   ??? Alcohol use: Yes     Alcohol/week: 36.0 standard drinks     Types: 36 Cans of beer per week     Comment: states 3 or 4 big beers a day every day   ??? Drug use: Not Currently     Types: Marijuana       Review of Systems  Constitutional: + intoxicated. Negative for fever.  Eyes: Negative for visual changes.  ENT: Negative for sore throat.  Cardiovascular: Negative for chest pain.  Respiratory: Negative for shortness of breath.  Gastrointestinal: Negative for abdominal pain, vomiting or diarrhea.  Genitourinary: Negative for dysuria.  Musculoskeletal: Negative for back pain.  Skin: Negative for rash.  Neurological: + tremors. Negative for headaches, focal weakness or numbness.  10-point ROS is otherwise negative except as documented.    Physical Exam     ED Triage Vitals [07/19/19 1317]   Enc Vitals Group      BP 137/84      Heart Rate 111      SpO2 Pulse       Resp 18      Temp 36.8 ??C (98.2 ??F)      Temp src       SpO2 98 %      Weight 73.5 kg (162 lb)      Height 1.727 m (5' 8)       Constitutional: AOx3. Well appearing and in no distress.  Eyes: No scleral icterus. Conjunctivae are normal.  ENT       Head: Normocephalic and atraumatic.       Nose: No congestion.       Mouth/Throat: Mucous membranes are moist.       Neck: No stridor.  Hematological/Lymphatic/Immunilogical: No anterior cervical lymphadenopathy.  Cardiovascular: Normal rate, regular rhythm. S1 and S2. Normal and symmetric distal pulses are present in all extremities. Warm and well perfused.  Respiratory: Normal respiratory effort. No increased WOB. Lungs clear to auscultation bilaterally. No wheezing or rales.  Gastrointestinal: Soft and nontender. No rebound or guarding. No CVA tenderness.  Musculoskeletal: Tenderness to palpation to bilateral scapula. Thoracic midline tenderness to palpation. Normal range of motion in all extremities.       Right lower leg: No tenderness or edema.       Left lower leg: No tenderness or edema.  Neurologic: No asterixis. No nystagmus. Normal speech and language. No gross focal neurologic deficits are appreciated.  Skin: No ecchymosis, erythema, or lesions overlying his back. Skin is warm, dry and intact. No rash noted.  Psychiatric: Mood and affect are normal. Speech and behavior are normal.    EKG     Upon my personal review, NSR at 90 bpm.  Normal axis.  Narrow QRS.  No ST elevations or depressions.  Intervals WNL.  No significant change compared to his most recent EKG obtained on 07/09/2019.    Radiology     No orders to display       Procedures     N/A    Please note- This chart has been created using [employer]. Chart  creation errors have been sought, but may not always be located and such creation errors, especially pronoun confusion, do NOT reflect on the standard of medical care.    Documentation assistance was provided by Levin Erp, Scribe, on July 19, 2019 at 2:26 PM for Dorian Heckle, MD.    Documentation assistance was provided by the scribe in my presence.  The documentation recorded by the scribe has been reviewed by me and accurately reflects the services I personally performed.     Dorian Heckle, MD  Resident  07/20/19 [phone]

## 2019-07-19 NOTE — Unmapped (Signed)
Assessment/Plan:    Principal Problem:    Alcohol withdrawal syndrome without complication (CMS-HCC)  Active Problems:    Depressive disorder    Reflux esophagitis    Tobacco use disorder    Prostate cancer (CMS-HCC)    Anxiety    COPD (chronic obstructive pulmonary disease) (CMS-HCC)    Chronic pancreatitis (CMS-HCC)      Joseph Phillips is a 58 y.o. y/o male with PMHx as noted below that presents to [facility] with Alcohol withdrawal syndrome without complication (CMS-HCC).    Alcohol abuse with plans for detox: Possible history of complicated withdrawal but not seen recently.  Multiple admissions for withdrawal but does not follow plan in the outpatient setting.  -Start Librium 25 mg 3 times daily tomorrow  -Plan for subsequent taper  -Oral Ativan for symptom based CIWA  -Multivitamin  -Social work for assistance    Non-anion gap metabolic acidosis: Likely diarrhea but unclear cause based on history but low concern for inappropriate ingestion and overall seems benign.  We will recheck labs in the morning.  -BMP in a.m.    Alcoholic gastritis  -PPI twice daily  -Famotidine as needed    Pancreatic insufficiency  -Creon with meals    Hypertension  -Amlodipine 10 mg daily    Depression:   -BuSpar 5 mg 3 times daily  -Trazodone nightly    Prostate cancer stage IV:  -xtandi 160mg  daily  -fentanyl patch q72hrs  -oxycodone 10mg  q4hrs    COPD  -Continue home inhalers with pharmacy substitutes    FEN/GI/DVT  Diet: Regular  IV fluids: PRN  Replete Lytes PRN   GI: PPI as above  DVT: Lovenox    Code Status:  Full Code    Dispo: Floor, obs  ___________________________________________________________________    Chief Complaint  Chief Complaint   Patient presents with   ??? Withdrawal - Alcohol       HPI:  Joseph Phillips is a 58 y.o. y/o male with PMHx as noted below that presents to [facility] with Alcohol withdrawal syndrome without complication (CMS-HCC).    Patient presents with hopes for undergoing inpatient alcohol withdrawal due to chronic alcohol abuse.  Patient has been admitted multiple times and was most recently discharged on 7/18 for a similar issue.  Patient states that he started drinking the next day and did not take Librium.  He has been drinking consecutively ever since.  He missed his appointments with primary care.  He has contacted his primary care physicians in hopes to get assistance drinking but it has been difficult to contact regularly.  His last drink was wine this morning.  He does have a history of complicated alcohol withdrawal.  He also has complications related to alcohol abuse including chronic pancreatitis and alcoholic gastritis.    He states that he has been feeling relatively well with no fevers, chills, shortness of breath, nausea, vomiting, chest pain, diarrhea, constipation.  He has been taking his medications without missing doses (except for Librium).  His pain is relatively well controlled but intermittently worse.  He has not consumed any alcoholic products that are deemed inappropriate for human consumption.    Labs in the emergency department were fairly consistent with prior labs except for a bicarb of 14 with no anion gap.  Alk phos is elevated as usual and AST is also elevated.  His alcohol level is 195.  Urine tox screen is positive for the typical substances.  Vitals overall reassuring.  Patient received folate and thiamine in  the ER.    Allergies:  Penicillins and Tramadol    Medications:   Prior to Admission medications    Medication Sig Start Date End Date Taking? Authorizing Provider   albuterol HFA 90 mcg/actuation inhaler INHALE 2 PUFFS EVERY 6 HOURS AS NEEDED FOR WHEEZING 07/13/19   Max Jeri Cos, MD   amLODIPine (NORVASC) 10 MG tablet Take 1 tablet (10 mg total) by mouth daily. 02/02/19 02/02/20  Towanda Malkin, MD   busPIRone (BUSPAR) 5 MG tablet TAKE 1 TABLET BY MOUTH THREE TIMES A DAY 04/16/19   Carlyon Shadow Dunn, ANP   calcium carbonate 650 mg calcium (1,625 mg) tablet Take 1 tablet (650 mg of elem calcium total) by mouth daily. 06/28/19 06/27/20  Towanda Malkin, MD   chlordiazePOXIDE (LIBRIUM) 25 MG capsule 1 tab this afternoon, 1 this evening, every 8 hours tomorrow and stop 07/10/19   Rica Koyanagi, MD   diclofenac sodium (VOLTAREN) 1 % gel Apply 2 g topically Four (4) times a day. Any area of pain 06/14/19 06/13/20  Loura Pardon, MD   diphenhydrAMINE (BENADRYL) 25 mg tablet Take 0.5 tablets (12.5 mg total) by mouth every six (6) hours as needed for itching. 03/11/19   Joslyn Hy House, MD   diphenhydramine HCl (MAGIC MOUTHWASH ORAL) suspension Take 10 mL by mouth 4 (four) times a day as needed. 11/23/18   Lolita Lenz, MD   enzalutamide [name]) 40 mg capsule Take 4 capsules (160 mg total) by mouth daily. 06/28/19   Towanda Malkin, MD   famotidine (PEPCID) 20 MG tablet Take 1 tablet (20 mg total) by mouth two (2) times a day as needed for heartburn. 06/23/19 07/23/19  Lawernce Keas, MD   fentaNYL (DURAGESIC) 25 mcg/hr patch Place 1 patch on the skin every third day. 05/27/19   Towanda Malkin, MD   megestroL (MEGACE) 400 mg/10 mL (40 mg/mL) suspension Take 10 mL (400 mg total) by mouth Two (2) times a day. 06/28/19 06/27/20  Towanda Malkin, MD   melatonin 3 mg Tab TK 1 T PO HS PRN 06/14/19   Historical Provider, MD   MULTIVITAMIN 50 PLUS Tab TK 1 T PO D 06/24/19   Historical Provider, MD   omeprazole (PRILOSEC) 20 MG capsule Take 2 capsules (40 mg total) by mouth Two (2) times a day. 06/23/19 07/23/19  Lawernce Keas, MD   oxyCODONE (ROXICODONE) 5 MG immediate release tablet Take 2 tablets (10 mg total) by mouth every four (4) hours as needed for pain. 07/14/19   Towanda Malkin, MD   pancrelipase, Lip-Prot-Amyl, (CREON) 24,000-76,000 -120,000 unit CpDR delayed release capsule Take 48,000 units of lipase by mouth Three (3) times a day with a meal. Take an additional 1 capsule with snacks.    Historical Provider, MD   prochlorperazine (COMPAZINE) 10 MG tablet Take 1 tablet (10 mg total) by mouth every eight (8) hours as needed. 06/23/19 07/23/19  Lawernce Keas, MD   salmeteroL (SEREVENT) 50 mcg/dose diskus inhaler Inhale 1 puff Two (2) times a day. 05/03/19 05/02/20  Magda Paganini, MD   tiotropium (SPIRIVA WITH HANDIHALER) 18 mcg inhalation capsule Place 1 capsule (18 mcg total) into inhaler and inhale once daily. 01/19/19   Lolita Lenz, MD   traZODone (DESYREL) 50 MG tablet Take 1 tablet (50 mg total) by mouth nightly as needed for sleep. 06/23/19 07/23/19  Lawernce Keas, MD   valACYclovir (VALTREX) 500 MG tablet Take 1  tablet (500 mg total) by mouth daily. 01/19/19   Lolita Lenz, MD       Medical History:  Past Medical History:   Diagnosis Date   ??? Alcohol abuse    ??? COPD (chronic obstructive pulmonary disease) (CMS-HCC)    ??? Hx of radiation therapy    ??? Hypertension    ??? Polysubstance abuse (CMS-HCC)    ??? Prostate cancer (CMS-HCC)    ??? Seizures (CMS-HCC)        Surgical History:  Past Surgical History:   Procedure Laterality Date   ??? IR INSERT PORT AGE GREATER THAN 5 YRS  07/15/2018    IR INSERT PORT AGE GREATER THAN 5 YRS 07/15/2018 Carolin Coy, MD IMG VIR [facility]   ??? PR COLSC FLX W/RMVL OF TUMOR POLYP LESION SNARE TQ N/A 08/16/2015    Procedure: COLONOSCOPY FLEX; W/REMOV TUMOR/LES BY SNARE;  Surgeon: Gwen Pounds, MD;  [facility];  Service: Gastroenterology   ??? PR COLSC FLX WITH DIRECTED SUBMUCOSAL NJX ANY SBST N/A 08/16/2015    Procedure: COLONOSCOPY, FLEXIBLE, PROXIMAL TO SPLENIC FLEXURE; WITH DIRECTED SUBMUCOSAL INJECTION(S), ANY SUBSTANCE;  Surgeon: Gwen Pounds, MD;  Location: [facility];  Service: Gastroenterology   ??? PROSTATE SURGERY         Social History:  Tobacco use:   reports that he has been smoking cigarettes. He started smoking about 25 years ago. He has a 10.00 pack-year smoking history. He has never used smokeless tobacco.  Alcohol use:   reports current alcohol use of about 36.0 standard drinks of alcohol per week.  Drug use:  reports previous drug use. Drug: Marijuana.      Family History:  The patient's family history includes Diabetes in his father and mother; Heart disease in his mother..    Review of Systems:  10 systems reviewed and are negative unless otherwise mentioned in HPI      Physical Exam:  Temp:  [36.8 ??C-37.2 ??C] 37.2 ??C  Heart Rate:  [91-111] 91  Resp:  [16-18] 16  BP: (121-137)/(70-84) 121/70  SpO2:  [97 %-98 %] 97 %  BMI (Calculated):  [24.64] 24.64    Gen: WDWN lying in bed in NAD  HENT: No nasal discharge, moist mucus membranes  Eyes: EOMI, no scleral icterus  Respiratory: CTA BL with no increased WOB  Cardio: Normal rate with regular rhythm no m/r/g  Abdomen: +BS, non-tender, non-distended  Extremities: warm and well perfused, trace edema  Neuro: slightly slurred speech, Alert and oriented with no focal neurological deficits  Skin: No rashes or jaundice    Test Results:  Data Review:    All lab results last 24 hours:    Recent Results (from the past 24 hour(s))   ECG 12 lead (Adult)    Collection Time: 07/19/19  2:34 PM   Result Value Ref Range    EKG Systolic BP  mmHg    EKG Diastolic BP  mmHg    EKG Ventricular Rate 90 BPM    EKG Atrial Rate 90 BPM    EKG P-R Interval 148 ms    EKG QRS Duration 84 ms    EKG Q-T Interval 394 ms    EKG QTC Calculation 481 ms    EKG Calculated P Axis 55 degrees    EKG Calculated R Axis 52 degrees    EKG Calculated T Axis 55 degrees    QTC Fredericia 451 ms   Comprehensive Metabolic Panel  Collection Time: 07/19/19  2:40 PM   Result Value Ref Range    Sodium 140 135 - 145 mmol/L    Potassium 4.4 3.5 - 5.0 mmol/L    Chloride 113 (H) 98 - 107 mmol/L    Anion Gap 13 7 - 15 mmol/L    CO2 14.0 (L) 22.0 - 30.0 mmol/L    BUN 14 7 - 21 mg/dL    Creatinine 1.61 0.96 - 1.30 mg/dL    BUN/Creatinine Ratio 15     EGFR CKD-EPI Non-African American, Male 81 >=60 mL/min/1.15m2    EGFR CKD-EPI African American, Male >90 >=60 mL/min/1.51m2    Glucose 87 70 - 179 mg/dL    Calcium 9.1 8.5 - 04.5 mg/dL    Albumin 4.4 3.5 - 5.0 g/dL    Total Protein 7.7 6.5 - 8.3 g/dL    Total Bilirubin 0.4 0.0 - 1.2 mg/dL    AST 77 (H) 19 - 55 U/L    ALT 29 <50 U/L    Alkaline Phosphatase 292 (H) 38 - 126 U/L   Lipase Level    Collection Time: 07/19/19  2:40 PM   Result Value Ref Range    Lipase 132 44 - 232 U/L   Ethanol    Collection Time: 07/19/19  2:40 PM   Result Value Ref Range    Alcohol, Ethyl 195.0 (H) 0.0-<10.0 mg/dL   CBC w/ Differential    Collection Time: 07/19/19  2:40 PM   Result Value Ref Range    WBC 7.4 4.5 - 11.0 10*9/L    RBC 3.22 (L) 4.50 - 5.90 10*12/L    HGB 10.8 (L) 13.5 - 17.5 g/dL    HCT 40.9 (L) 81.1 - 53.0 %    MCV 99.0 80.0 - 100.0 fL    MCH 33.5 26.0 - 34.0 pg    MCHC 33.8 31.0 - 37.0 g/dL    RDW 91.4 78.2 - 95.6 %    MPV 6.8 (L) 7.0 - 10.0 fL    Platelet 243 150 - 440 10*9/L    Neutrophils % 55.3 %    Lymphocytes % 33.3 %    Monocytes % 5.9 %    Eosinophils % 1.1 %    Basophils % 1.0 %    Absolute Neutrophils 4.1 2.0 - 7.5 10*9/L    Absolute Lymphocytes 2.5 1.5 - 5.0 10*9/L    Absolute Monocytes 0.4 0.2 - 0.8 10*9/L    Absolute Eosinophils 0.1 0.0 - 0.4 10*9/L    Absolute Basophils 0.1 0.0 - 0.1 10*9/L    Large Unstained Cells 3 0 - 4 %   Toxicology Screen, Urine    Collection Time: 07/19/19  2:44 PM   Result Value Ref Range    Amphetamine Screen, Ur <500 ng/mL Not Applicable    Barbiturate Screen, Ur <200 ng/mL Not Applicable    Benzodiazepine Screen, Urine =/>200 ng/mL (A) Not Applicable    Cannabinoid Scrn, Ur =/>20 ng/mL (A) Not Applicable    Methadone Screen, Urine <300 ng/mL Not Applicable    Cocaine(Metab.)Screen, Urine <150 ng/mL Not Applicable    Opiate Scrn, Ur =/>300 ng/mL (A) Not Applicable   Urinalysis with Culture Reflex    Collection Time: 07/19/19  2:44 PM    Specimen: Clean Catch; Urine   Result Value Ref Range    Color, UA Yellow     Clarity, UA Clear     Specific Gravity, UA 1.025 1.005 - 1.040    pH, UA 5.5 5.0 - 9.0  Leukocyte Esterase, UA Negative Negative    Nitrite, UA Negative Negative    Protein, UA Negative Negative    Glucose, UA Negative Negative    Ketones, UA Negative Negative    Urobilinogen, UA 0.2 mg/dL 0.2 - 2.0 mg/dL    Bilirubin, UA Negative Negative    Blood, UA Negative Negative    RBC, UA 0 <3 /HPF    WBC, UA 1 <2 /HPF    Squam Epithel, UA 3 0 - 5 /HPF    Bacteria, UA None Seen None Seen /HPF       Imaging: Radiology studies were personally reviewed    EKG: Normal sinus rhythm, no ischemic changes     I reviewed the Fellows  note and I was available, we discussed the case face-to-face.  The patient is known to me from a recent admission and discharge for the same clinical scenario.  T.  I agree with the  Fellows  findings and plan, updated as clinically indicated.   Griffith Citron, MD

## 2019-07-19 NOTE — Unmapped (Signed)
 Title: Transitions of Care Phone Call Follow-Up  Transitions Phone Call Follow-Up: Week # 2 after hospital f/u clinic visit   Person spoken with: LVM  I was unable to contact pt. I will try calling back. Pt currently in ED    Conditions and Symptoms  New problems: Pt currently is back at ED  Medications: no changes    Post-Acute Services  Home Health Services - Agency name/contact:   DME:                                             Upcoming appointments:  Barriers: NONE  Community resource needs: NA  Advanced Directives: None      Comments: went to ED    Provided the patient with the following information:  -  Same Day Clinic:  [phone].  8am-5pm M-F.   -  After Hours:  [phone].  A nurse will answer and can help you decide what kind of medication attention you need.  A doctor is also there to help  [facility]:  [phone].  If you are sick, but not injured:  9am-8pm Mon-Sun.  [address], [place], [place] off of [address].  All walk-ins accepted.    [facility] at the [facility]:  [phone].  7am-9pm Mon-Fri; 12pm-5pm [address]  [address] [place] [address].  All walk-ins accepted.        Information forwarded to PCP for review.

## 2019-07-19 NOTE — Unmapped (Signed)
I received a call from this patient of Dr. Leota Jacobsen stating he needs help with detox. Patient said he was in the hospital for 3 days previously and that was not long enough.  Patient is asking for more help.  I asked Evangeline Dakin for assistance.  She suggested he go to the ER.  Additionally, chart notes indicate that SW Jennell Corner has been trying to reach patient regarding the detox.  I gave patient SW number, transferred him to her, and instructed him to go to ER.

## 2019-07-19 NOTE — Unmapped (Signed)
CARE MANAGEMENT ENCOUNTER    2nd attempt to reach pt after call from him to clinic on 7/24. No answer, no VM set up. This makes a total of 6 attempts to reach pt in the last week so far.     LCSW is pending to share with pt information from 07/12/19 Tel Encounter. This information was also mailed to pt in a letter from LCSW on 07/13/19.    Time Spent: 1 minute    Jennell Corner, MSW, [facility]  [facility] Manager- [facility] Internal Medicine  Phone: [phone]  Pager: [phone]

## 2019-07-20 LAB — BASIC METABOLIC PANEL
ANION GAP: 5 mmol/L — ABNORMAL LOW (ref 7–15)
BLOOD UREA NITROGEN: 15 mg/dL (ref 7–21)
BUN / CREAT RATIO: 21
CALCIUM: 9.2 mg/dL (ref 8.5–10.2)
CO2: 19 mmol/L — ABNORMAL LOW (ref 22.0–30.0)
CREATININE: 0.73 mg/dL (ref 0.70–1.30)
EGFR CKD-EPI NON-AA MALE: >90 mL/min/{1.73_m2} (ref >=60)
GLUCOSE RANDOM: 116 mg/dL (ref 70–179)
POTASSIUM: 4.8 mmol/L (ref 3.5–5.0)
SODIUM: 138 mmol/L (ref 135–145)

## 2019-07-20 LAB — MAGNESIUM: Magnesium:MCnc:Pt:Ser/Plas:Qn:: 2

## 2019-07-20 LAB — POTASSIUM: Potassium:SCnc:Pt:Ser/Plas:Qn:: 4.8

## 2019-07-20 NOTE — Unmapped (Signed)
Tobacco Treatment Program  Phone Visit Note    This medical encounter was conducted virtually using [email or web address]  TeleHealth protocols.      I have identified myself to the patient and conveyed my credentials to [name]  I have explained the capabilities and limitations of telemedicine and the patient/proxy and myself both agree that it is appropriate for their current circumstances/symptoms. The patient/proxy has given me verbal consent to proceed.     Patient's location at the time of the telephone visit: Hospitalized at [facility]   Provider's location at the time of the telephone visit: At home, in [facility] Information  Person Contacted: [name] Phone number: [phone]       Phone Outcome: Talked to Pt   Is there someone else in the room? No.      Purpose of contact:     [name] is a 58 y.o. male is participating in a telephone visit for tobacco treatment counseling. Patient consented to telephone visit because of social distancing measures in place due to the COVID-19 pandemic.     NOTE:     SUMMARY: Sw utilized Motivational Interviewing techniques to assess Pt for treatment of tobacco use. Pt shard that he smokes 10cpd, typically to assist in alleviating stress. Pt expressed desire to quit. SW utilized MI techniques to elicit Pt's motivators to quit. SW explored with Pt pros and cons of smoking, as well as helped Pt identify triggers.  SW provided pscyhoeducation on 7 FDA approved cessation medications. We discussed behavioral strategies to assist with managing urges and triggers. Sw encouraged Pt to be mindful of triggers and to enact oral strategies that incorporate the use of toothpicks, drinking straws, hard candy or lollipops to assist in alleviating cravings. Pt can reorganize some parts of their day and incorporate use oral strategies whenever cravings emerge to promote tobacco free lifestyle and to disassociate daily activities from tobacco use. SW provided pt with information on physical improvements related to tobacco cessation, and available resources.    Tobacco Use Treatment  Program: Hospital Inpatient  Type of Visit: Initial  Tobacco Use Treatment Visit: Talked with patient  Goals Of Session: Communication of feelings, Assessment, Insight, increase, Problem-solving skill development, Risk behaviors, modulate, Relapse prevention skills training, Communication skills, improve, Behavior management, improve    Tobacco Use During Past 30 Days  Time Since Last Tobacco Use: 1 to 7 days ago  Tobacco Withdrawal (Past 24 Hours): None noted  Type of Tobacco Products Used: Cigarettes  Quantity Used: 10  Quantity Per: day    Tobacco Use History  Medications Used in Past Attempts: None  Previously seen by NDP?: Hospital Inpatient    Behavioral Assessment  Why Uses: 1. stress-relief   Reasons to Become Tobacco Free: 1. pt intends to abstain from alcohol and would like to cease smoking as well   Barriers/Challenges: 1. pt smokes to alleviate stress   Strategies:  1. Sw discussed behavioral distractions associated with engaging in repetitive or simple activities whenever urges emerge 2. Sw encouraged Pt to be mindful of triggers and to enact oral strategies that incorporate the use of toothpicks, drinking straws, hard candy or lollipops to assist in alleviating cravings 3. Pt can reorganize some parts of their day and incorporate use oral strategies whenever cravings emerge to promote tobacco free lifestyle and to disassociate daily activities from tobacco  use    Treatment Plan  Cessation Meds Currently Using: None  Medications Recommended During Hospitalization: Patient declines  Outpatient/Discharge Medications Recommended: Patient declines  Patient's Plan Post Discharge/Visit: Plan to quit as soon as possible    TTS Information  Diagnosis: Tobacco use disorder, unspecified, uncomplicated (F17.200)  Interventions: Assessed, Behavioral techniques, Discussed, Motivational interviewing, Informed, Suggested, Encouraged, Supportive therapy, Psycho-education, Taught  TTS Visit Length: >10 minutes    As part of this Telephone Visit, no in-person exam was conducted.    I spent 9 minutes on the telephone with the patient.   I spent an additional 8 minutes on pre- and post-visit activities (including charting, communication with medical team, and coordination of aftercare).      Visit Format/Coding: Telephone Call (Audio Only): Behavioral Health/Non-Physicians:  In charge capture section use [phone] (11-20 minutes)    Donita Brooks, MSW, LCSW, NCTTP  Clinical Social Worker/ Tobacco Treatment Specialist  Inpatient Tobacco Treatment Program   Phone: [phone]  Pager: [phone]

## 2019-07-20 NOTE — Unmapped (Signed)
Care Management  Initial Transition Planning Assessment    Indicators for social work consultation identified.  Indicators include: ETOH abuse, readmission risk  Social worker consulted for psychosocial and discharge needs assessment.    CM will continue to follow for avoidable delays and opportunities for progression of care.   07/19/2019 6:08 PM     Type of Residence: Mailing Address:  [address]  [place] [address]  Contacts: Accompanied by: Alone  Patient Phone Number: [phone]        Medical Provider(s): Max Jeri Cos, MD  Reason for Admission: Admitting Diagnosis:  Alcohol withdrawal syndrome without complication (CMS-HCC) [F10.230]  Past Medical History:   has a past medical history of Alcohol abuse, COPD (chronic obstructive pulmonary disease) (CMS-HCC), radiation therapy, Hypertension, Polysubstance abuse (CMS-HCC), Prostate cancer (CMS-HCC), and Seizures (CMS-HCC).  Past Surgical History:   has a past surgical history that includes Prostate surgery; pr colsc flx w/rmvl of tumor polyp lesion snare tq (N/A, 08/16/2015); pr colsc flx with directed submucosal njx any sbst (N/A, 08/16/2015); and IR Insert Port Age Greater Than 5 Years (07/15/2018).   Previous admit date: 06/22/2019    Primary Insurance- Payor: MEDICAID [place] / Plan: MEDICAID  ACCESS / Product Type: *No Product type* /   Secondary Insurance ??? None  Prescription Coverage ??? medicaid  Preferred Pharmacy [id number] - [place], [place] - [address] AT [facility]  [facility]    Transportation home: TBD  Level of function prior to admission: Independent                      General  Care Manager assessed the patient by : Medical record review  Who provides care at home?: N/A  Reason for referral: Psychosocial/Community Resource Concerns    Contact/Decision Maker  Extended Emergency Contact Information  Primary Emergency Contact: Luby,Curtis   [place]  Home Phone: [phone]  Mobile Phone: [phone]  Relation: Brother    Legal Next of Kin / Guardian / POA / Advance Directives     HCDM (patient stated preference): Delsol,Curtis - Brother - [phone]    Advance Directive (Medical Treatment)  Does patient have an advance directive covering medical treatment?: Patient does not have advance directive covering medical treatment.  Reason patient does not have an advance directive covering medical treatment:: Patient needs follow-up to complete one.    Health Care Decision Maker [HCDM] (Medical & Mental Health Treatment)  Healthcare Decision Maker: Patient does not wish to appoint a Health Care Decision Maker at this time  Information offered on HCDM, Medical & Mental Health advance directives:: Patient given information.    Advance Directive (Mental Health Treatment)  Does patient have an advance directive covering mental health treatment?: Patient does not have advance directive covering mental health treatment.  Reason patient does not have an advance directive covering mental health treatment:: Patient needs follow-up to complete one.    Patient Information  Lives with: Family members, Parent    Type of Residence: Private residence        Location/Detail: [place]    Support Systems: Parent, Family Members    Responsibilities/Dependents at home?: No    Home Care services in place prior to admission?: No         Equipment Currently Used at Home: cane, straight       Currently receiving outpatient dialysis?: No       Financial Information  Need for financial assistance?: No       Social Determinants of Health  Social Determinants of Health were addressed in provider documentation.  Please refer to patient history.    Discharge Needs Assessment  Concerns to be Addressed: substance/tobacco abuse/use, utilization management, decision making, coping/stress, care coordination/care conferences    Clinical Risk Factors: Poor Health Literacy, Readmission < 30 Days, Multiple Diagnoses (Chronic)    Barriers to taking medications: No    Prior overnight hospital stay or ED visit in last 90 days: Yes    Readmission Within the Last 30 Days: previous discharge plan unsuccessful         Anticipated Changes Related to Illness: none    Equipment Needed After Discharge: none    Discharge Facility/Level of Care Needs: other (see comments)(TBD)    Readmission  Risk of Unplanned Readmission Score:  %  Predictive Model Details   No score data available for [facility] Risk of Unplanned Readmission     Readmitted Within the Last 30 Days? (No if blank)   Patient at risk for readmission?: Yes    Discharge Plan  Screen findings are: Care Manager reviewed the plan of the patient's care with the Multidisciplinary Team. No discharge planning needs identified at this time. Care Manager will continue to manage plan and monitor patient's progress with the team.    Expected Discharge Date:     Expected Transfer from Critical Care:      Patient and/or family were provided with choice of facilities / services that are available and appropriate to meet post hospital care needs?: Other (Comment)(SW will offer choice)       Initial Assessment complete?: Yes  Adline Potter  July 19, 2019 6:09 PM

## 2019-07-20 NOTE — Unmapped (Signed)
Hospitalist Daily Progress Note     LOS: 0 days       Assessment/Plan:  Principal Problem:    Alcohol withdrawal syndrome without complication (CMS-HCC)  Active Problems:    Depressive disorder    Reflux esophagitis    Tobacco use disorder    Prostate cancer (CMS-HCC)    Anxiety    COPD (chronic obstructive pulmonary disease) (CMS-HCC)    Chronic pancreatitis (CMS-HCC)  Resolved Problems:    * No resolved hospital problems. *         81.  58 year old African-American male recently discharged after admission for virtually identical presentation with alcohol withdrawal syndrome.  His CIWA scores are low and he is recovering promptly.  At his last discharge, he declined any outpatient treatment.  He does not have much insight into his need for treatment beyond his initial detox that we are able to offer him here in the hospital.  He is counseled in detail at bedside regarding the need to proceed with a 12-step program and as of today, he is agreeable to this.  Continue scheduled Librium and PRN alprazolam  He will be counseled that mixing alcohol with his current pain management regimen that includes 2 forms of opiates (fentanyl, oxycodone) that could lead to an aspiration event, potentially fatal.      2.  Essential hypertension.  Continue current regimen with amlodipine.    3.  History of depression and anxiety.  Continue BuSpar.    4.  GERD.  PPI.    5.  Prostate cancer, no active issues.    6.  COPD, not in exacerbation.  He is not on oxygen.    7.  History of chronic pancreatitis, no abdominal pain or acute issues.  Continue pancreatic enzymes.    8.  Cannabinoid use.  Toxicology screen is positive for these substances.    9.  Chronic pain management from prostate cancer.  He is on a fentanyl patch and oxycodone.  Continue Megace.      *DVT prophylaxis: Subcutaneous Lovenox.    *Disposition: He may be ready to go in 24 to 48 hours.  Discharge to a treatment facility would be best.      Please page the [facility]) pager at [phone] with questions.          Subjective:   No complaint of tremor or visual hallucinations.  No complaint of nausea or vomiting.    Objective:       Vital signs in last 24 hours:  Temp:  [36.3 ??C (97.3 ??F)-36.7 ??C (98.1 ??F)] 36.7 ??C (98 ??F)  Heart Rate:  [68-95] 68  Resp:  [16-18] 18  BP: (138-187)/(86-99) 138/86  MAP (mmHg):  [123-129] 123  SpO2:  [98 %-100 %] 100 %    Intake/Output last 24 hours:    Intake/Output Summary (Last 24 hours) at 07/20/2019 1528  Last data filed at 07/20/2019 1100  Gross per 24 hour   Intake 240 ml   Output ???   Net 240 ml         Physical Exam:    Gen: Alert, NAD    HEENT: No icterus.    CV: Regular rate and rhythm.    PULM/chest: Clear to auscultation.    EXB:MWUX, Non tender, No palpable organomegaly    Ext: No C/C/E    Neuro: No acute focal deficits.    Skin: Warm, dry.      Medications:   Scheduled Meds:  ??? amLODIPine  10 mg Oral Daily   ??? arformoteroL  15 mcg Nebulization BID (RT)   ??? busPIRone  5 mg Oral TID   ??? chlordiazePOXIDE  25 mg Oral TID   ??? diclofenac sodium  2 g Topical 4x Daily   ??? enoxaparin (LOVENOX) injection  40 mg Subcutaneous Q24H   ??? enzalutamide  160 mg Oral Daily   ??? fentaNYL  1 patch Transdermal Q72H   ??? folic acid  1 mg Oral Daily   ??? megestroL  400 mg Oral BID   ??? multivitamin, with zinc  1 tablet Oral Daily   ??? pancrelipase (Lip-Prot-Amyl)  48,000 units of lipase Oral 3xd Meals   ??? pantoprazole  20 mg Oral BID   ??? polyethylene glycol  17 g Oral Daily   ??? umeclidinium  1 puff Inhalation Daily (RT)         Lab Results   Component Value Date    WBC 7.4 07/19/2019    HGB 10.8 (L) 07/19/2019    HCT 31.9 (L) 07/19/2019    PLT 243 07/19/2019         Lab Results   Component Value Date    BILITOT 0.4 07/19/2019    BILIDIR <0.10 06/28/2019    PROT 7.7 07/19/2019    ALBUMIN 4.4 07/19/2019    ALT 29 07/19/2019    AST 77 (H) 07/19/2019    ALKPHOS 292 (H) 07/19/2019       Lab Results   Component Value Date    PT 12.5 06/04/2019    INR 1.09 06/04/2019    APTT 36.5 06/04/2019     Recent Labs     07/19/19  1440 07/20/19  0621   NA 140 138   K 4.4 4.8   CL 113* 114*   CO2 14.0* 19.0*   BUN 14 15   CREATININE 0.93 0.73   GLU 87 116   CALCIUM 9.1 9.2   PROT 7.7  --    BILITOT 0.4  --    AST 77*  --    ALT 29  --    ALKPHOS 292*  --    MG  --  2.0   LIPASE 132  --        Recent Labs     07/19/19  1444   WBCUA 1   NITRITE Negative   LEUKOCYTESUR Negative   BACTERIA None Seen   RBCUA 0   BLOODU Negative   GLUCOSEU Negative   PROTEINUA Negative   KETONESU Negative     Recent Labs     07/19/19  1440 07/19/19  1444   OPIAU  --  =/>300 ng/mL*   BENZU  --  =/>200 ng/mL*   AMPHU  --  <500 ng/mL   COCAU  --  <150 ng/mL   CANNAU  --  =/>20 ng/mL*   BARBU  --  <200 ng/mL   ETOH 195.0*  --        35 minutes, over 50% spent in counseling and coordination of care.  Tawni Levy MD

## 2019-07-20 NOTE — Unmapped (Signed)
Pt resting in bed, no signs of distress noted, VSS. No significant events overnight, pt rested well. Safety precautions in place, will continue to monitor.       Problem: Adult Inpatient Plan of Care  Goal: Plan of Care Review  Outcome: Progressing  Goal: Patient-Specific Goal (Individualization)  Outcome: Progressing  Goal: Absence of Hospital-Acquired Illness or Injury  Outcome: Progressing  Goal: Optimal Comfort and Wellbeing  Outcome: Progressing  Goal: Readiness for Transition of Care  Outcome: Progressing  Goal: Rounds/Family Conference  Outcome: Progressing     Problem: Fall Injury Risk  Goal: Absence of Fall and Fall-Related Injury  Outcome: Progressing

## 2019-07-20 NOTE — Unmapped (Signed)
Social Work  Psychosocial Assessment    Patient Name: Joseph Phillips   Medical Record Number: [id number]   Date of Birth: 12-Apr-1961  Sex: Male     Referral  Referred by: Care Manager  Reason for Referral: Substance Abuse Issues  Comment: SW is familiar with this pt from previous admission however, at that time, he expressed that he only wanted help to get the alcohol out of his system and he declined any other treatment or f/u support. Per pt's report, his father was a drinker. Pt reported that both his father and one of his brothers died in the last 6 months although, per evidence cited in chart review, it was likely longer ago. Pt has hx of 10 years sobriety until 1-2 years ago. Pt has declined outpt support options that have been presented to him to date.    Extended Emergency Contact Information  Primary Emergency Contact: Phillips,Joseph  Home Phone: [phone]  Mobile Phone: [phone]  Relation: Brother    Legal Next of Kin / Guardian / POA / Advance Directives    HCDM (patient stated preference): Joseph Phillips - Brother [phone]    Advance Directive (Medical Treatment)  Does patient have an advance directive covering medical treatment?: Patient does not have advance directive covering medical treatment.(Pt states he and his brother are in the process of completing one that would allow his brother Joseph Phillips to be his HCPOA.)  Reason patient does not have an advance directive covering medical treatment:: Patient needs follow-up to complete one.    Health Care Decision Maker [HCDM] (Medical & Mental Health Treatment)  Healthcare Decision Maker: HCDM documented in the HCDM/Contact Info section.  Information offered on HCDM, Medical & Mental Health advance directives:: Patient given information.    Advance Directive (Mental Health Treatment)  Does patient have an advance directive covering mental health treatment?: Patient does not have advance directive covering mental health treatment.  Reason patient does not have an advance directive covering mental health treatment:: HCDM documented in the HCDM/Contact Info section.    Discharge Planning  Discharge Planning Information:   Type of Residence   Mailing Address:  [address]  [place] [address]   [phone] (home)       Medical Information   Past Medical History:   Diagnosis Date   ??? Alcohol abuse    ??? COPD (chronic obstructive pulmonary disease) (CMS-HCC)    ??? Hx of radiation therapy    ??? Hypertension    ??? Polysubstance abuse (CMS-HCC)    ??? Prostate cancer (CMS-HCC)    ??? Seizures (CMS-HCC)        Past Surgical History:   Procedure Laterality Date   ??? IR INSERT PORT AGE GREATER THAN 5 YRS  07/15/2018    IR INSERT PORT AGE GREATER THAN 5 YRS 07/15/2018 Carolin Coy, MD [facility]   ??? PR COLSC FLX W/RMVL OF TUMOR POLYP LESION SNARE TQ N/A 08/16/2015    Procedure: COLONOSCOPY FLEX; W/REMOV TUMOR/LES BY SNARE;  Surgeon: Gwen Pounds, MD;  [facility];  Service: Gastroenterology   ??? PR COLSC FLX WITH DIRECTED SUBMUCOSAL NJX ANY SBST N/A 08/16/2015    Procedure: COLONOSCOPY, FLEXIBLE, PROXIMAL TO SPLENIC FLEXURE; WITH DIRECTED SUBMUCOSAL INJECTION(S), ANY SUBSTANCE;  Surgeon: Gwen Pounds, MD;  Location: [facility];  Service: Gastroenterology   ??? PROSTATE SURGERY         Family History   Problem Relation Age of Onset   ??? Heart  disease Mother    ??? Diabetes Mother    ??? Diabetes Father    ??? Melanoma Neg Hx    ??? Basal cell carcinoma Neg Hx    ??? Squamous cell carcinoma Neg Hx        Financial Information   Primary Insurance: Payor: MEDICAID [place] / Plan: MEDICAID [place] ACCESS / Product Type: *No Product type* /    Secondary Insurance: None   Prescription Coverage: Medicaid   Preferred Pharmacy: [id number] - [place], [place] - [address] AT [facility]  [facility]    Barriers to taking medication: No    Transition Home   Transportation at time of discharge: Other: TBD Anticipated changes related to Illness: none   Services in place prior to admission: N/A   Services anticipated for DC: Substance Abuse Services: Inpt or OP SA program   Hemodialysis Prior to Admission: No    Readmission  Risk of Unplanned Readmission Score:  %  Readmitted Within the Last 30 Days? Yes  Readmission Factors include: previous discharge plan unsuccessful      Social Determinants of Health  Social History     Socioeconomic History   ??? Marital status: Single     Spouse name: None   ??? Number of children: None   ??? Years of education: 21   ??? Highest education level: 12th grade   Occupational History   ??? Occupation: DISABLED   Social Needs   ??? Financial resource strain: None   ??? Food insecurity     Worry: Never true     Inability: Never true   ??? Transportation needs     Medical: Yes     Non-medical: None   Tobacco Use   ??? Smoking status: Current Every Day Smoker     Packs/day: 0.50     Years: 40.00     Pack years: 20.00     Types: Cigarettes     Start date: 05/09/1994   ??? Smokeless tobacco: Never Used   ??? Tobacco comment: Pt smokes 10cpd, pt expressed desire to quit    Substance and Sexual Activity   ??? Alcohol use: Yes     Alcohol/week: 36.0 standard drinks     Types: 36 Cans of beer per week     Comment: states 3 or 4 big beers a day every day   ??? Drug use: Not Currently     Types: Marijuana   ??? Sexual activity: Not Currently   Lifestyle   ??? Physical activity     Days per week: None     Minutes per session: None   ??? Stress: None   Relationships   ??? Social Wellsite geologist on phone: None     Gets together: None     Attends religious service: None     Active member of club or organization: None     Attends meetings of clubs or organizations: None     Relationship status: None   Other Topics Concern   ??? Do you use sunscreen? No   ??? Tanning bed use? No   ??? Are you easily burned? No   ??? Excessive sun exposure? No   ??? Blistering sunburns? No   Social History Narrative   ??? None     Housing/Utilities   ??? Within the past 12 months, have you ever stayed: outside, in a car, in a tent, in an overnight shelter, or temporarily in  someone else's home (i.e. couch-surfing)?     ??? Are you worried about losing your housing?     ??? Within the past 12 months, have you been unable to get utilities (heat, electricity) when it was really needed?       Literacy   ??? How often do you need to have someone help you when you read instructions, pamphlets, or other written material from your doctor or pharmacy?         Social History  Support Systems: Parent, Family Members    Medical and Psychiatric History  Psychosocial Stressors: Coping with health challenges/recent hospitalization, Grief/Loss (e.g. relationship, social, work, Surveyor, quantity), Surveyor, quantity concerns, Concern for non-compliance, Comment(lack of transportation is also a stressor and impedes his ability to access medical care and supports.)   Comment: Pt's father and brother died within the last year. Pt has stage IV bone CA.     Psychological Issues/Information: Mental illness   Concerns: Patient history of mental illness   Comment: Anxiety and Depressive dx listed     Chemical Dependency: Alcohol, Other (Comment)(Pt also has hx of breaking pain contract years ago.)      Outpatient Providers: Primary Care Provider, Specialist   Name / Contact #: : PCP: Unknown [place], [phone]    [facility]: Dr. Claude Manges     Legal: No legal issues      Ability to Access Community Services: Lack of transportation, Comment   Comment: Pt so far has been reluctant to accept community resources r/t ETOH use       Mercer Pod, MSW, LCSW, ACM-SW, July 20, 2019 4:31 PM

## 2019-07-21 LAB — BASIC METABOLIC PANEL
ANION GAP: 10 mmol/L (ref 7–15)
BLOOD UREA NITROGEN: 19 mg/dL (ref 7–21)
BUN / CREAT RATIO: 26
CALCIUM: 9.6 mg/dL (ref 8.5–10.2)
CHLORIDE: 110 mmol/L — ABNORMAL HIGH (ref 98–107)
CO2: 18 mmol/L — ABNORMAL LOW (ref 22.0–30.0)
CREATININE: 0.74 mg/dL (ref 0.70–1.30)
EGFR CKD-EPI NON-AA MALE: >90 mL/min/{1.73_m2} (ref >=60)
POTASSIUM: 4.7 mmol/L (ref 3.5–5.0)
SODIUM: 138 mmol/L (ref 135–145)

## 2019-07-21 LAB — MAGNESIUM: Magnesium:MCnc:Pt:Ser/Plas:Qn:: 1.9

## 2019-07-21 LAB — EGFR CKD-EPI NON-AA MALE: Lab: >90

## 2019-07-21 NOTE — Unmapped (Signed)
Patient in bed this tour , alert and oriented to person, place, time and situation. C/O rating 7/10. Pain medication given per prn order with effective results  Problem: Adult Inpatient Plan of Care  Goal: Plan of Care Review  Outcome: Progressing  Goal: Patient-Specific Goal (Individualization)  Outcome: Progressing  Goal: Absence of Hospital-Acquired Illness or Injury  Outcome: Progressing  Goal: Optimal Comfort and Wellbeing  Outcome: Progressing  Goal: Readiness for Transition of Care  Outcome: Progressing  Goal: Rounds/Family Conference  Outcome: Progressing     Problem: Fall Injury Risk  Goal: Absence of Fall and Fall-Related Injury  Outcome: Progressing

## 2019-07-21 NOTE — Unmapped (Signed)
Pt resting in bed, no signs of distress noted, VSS. No significant events overnight, pt rested well. Safety precautions in place, will continue to monitor.       Problem: Adult Inpatient Plan of Care  Goal: Plan of Care Review  Outcome: Progressing  Goal: Patient-Specific Goal (Individualization)  Outcome: Progressing  Goal: Absence of Hospital-Acquired Illness or Injury  Outcome: Progressing  Goal: Optimal Comfort and Wellbeing  Outcome: Progressing  Goal: Readiness for Transition of Care  Outcome: Progressing  Goal: Rounds/Family Conference  Outcome: Progressing     Problem: Fall Injury Risk  Goal: Absence of Fall and Fall-Related Injury  Outcome: Progressing

## 2019-07-21 NOTE — Unmapped (Signed)
Adult Nutrition Assessment Note    Visit Type: RD Risk Alert  Reason for Visit: Per Admission Nutrition Screen (Adult), Have you gained or lost 10 pounds in the past 3 months?, Have you had a decrease in food intake or appetite?    ASSESSMENT:   HPI & PMH:  Per EMR:  Principal Problem:    Alcohol withdrawal syndrome without complication (CMS-HCC)  Active Problems:    Depressive disorder    Reflux esophagitis    Tobacco use disorder    Prostate cancer (CMS-HCC)    Anxiety    COPD (chronic obstructive pulmonary disease) (CMS-HCC)    Chronic pancreatitis (CMS-HCC)  Nutrition Hx:  Pt asleep at time of visit. Noted stable weight. Noted ETOH gastritis.  Nutritionally Pertinent Meds:  folic acid, megace, MVI w/ zinc, pancrelipase, protonix, miralax, oxycodone  Labs:  Reviewed, unremarkable (07/20/19)  Abd/GI:  Last BM=07/20/19  Skin:   Patient Lines/Drains/Airways Status    Active Wounds     None               Current nutrition therapy order:   Nutrition Orders          Nutrition Therapy General (Regular) starting at 07/27 1738           Anthropometric Data:  -- Height: 172.7 cm (5' 8)   -- Last recorded weight: 73.5 kg (162 lb)  -- Admission weight: 73.5 kg (162 lb)  -- IBW: 69.92 kg  -- Percent IBW: 105.1 %  -- BMI: Body mass index is 24.63 kg/m??.   -- Weight changes this admission:   Last 5 Recorded Weights    07/19/19 1317   Weight: 73.5 kg (162 lb)      -- Weight history PTA:   Wt Readings from Last 10 Encounters:   07/19/19 73.5 kg (162 lb)   07/12/19 73.5 kg (162 lb 1.6 oz)   07/09/19 75.5 kg (166 lb 6.4 oz)   06/28/19 73.8 kg (162 lb 12.8 oz)   06/22/19 74.8 kg (165 lb)   06/04/19 73.6 kg (162 lb 4.1 oz)   05/27/19 74.9 kg (165 lb 1.6 oz)   04/26/19 73.9 kg (162 lb 14.4 oz)   03/29/19 72.9 kg (160 lb 11.2 oz)   03/10/19 72.6 kg (160 lb)        Daily Estimated Nutrient Needs:   Energy: 2956-2130 kcals [25-30 kcal/kg using admission body weight, 73.5 kg (07/21/19 0851)]  Protein: 65-81 gm [0.8-1 gm/kg using admission body weight, 73.5 kg (07/21/19 0851)]  Carbohydrate:   [no restriction]  Fluid: 8657-8469 [1 mL/kcal (maintenance)]     Nutrition Focused Physical Exam:                   Nutrition Evaluation  Nutrition Designation: Normal weight (BMI 18.50 - 24.99 kg/m2) (07/21/19 0851)     DIAGNOSIS:  Malnutrition Assessment using AND/ASPEN Clinical Characteristics:                            Overall nutrition impression:  Weight hx over the last 4 months 160-166lbs. Pt with recurrent admission for ETOH abuse - pt likely with altered po intakes over this time as well.     GOALS:  Oral Intake:       - Patient to consume >/=75% of 3 meals per day.     RECOMMENDATIONS AND INTERVENTIONS:  1. Continue Regular diet, as ordered  2. Recommend folic acid and thiamine due  to ETOH abuse  3. Monitor po intakes    RD Follow Up Parameters:  1-2 times per week (and more frequent as indicated)

## 2019-07-22 NOTE — Unmapped (Signed)
Pt resting in bed, no signs of distress noted, VSS. No significant events overnight, pt rested well. Safety precautions in place, will continue to monitor.       Problem: Adult Inpatient Plan of Care  Goal: Plan of Care Review  Outcome: Progressing  Goal: Patient-Specific Goal (Individualization)  Outcome: Progressing  Goal: Absence of Hospital-Acquired Illness or Injury  Outcome: Progressing  Goal: Optimal Comfort and Wellbeing  Outcome: Progressing  Goal: Readiness for Transition of Care  Outcome: Progressing  Goal: Rounds/Family Conference  Outcome: Progressing     Problem: Fall Injury Risk  Goal: Absence of Fall and Fall-Related Injury  Outcome: Progressing

## 2019-07-22 NOTE — Unmapped (Signed)
Patient alert and responding to verbal stimuli. Continues to remain free of s/s of alcohol withdrawal  Problem: Adult Inpatient Plan of Care  Goal: Plan of Care Review  Outcome: Progressing  Goal: Patient-Specific Goal (Individualization)  Outcome: Progressing  Goal: Absence of Hospital-Acquired Illness or Injury  Outcome: Progressing  Goal: Optimal Comfort and Wellbeing  Outcome: Progressing  Goal: Readiness for Transition of Care  Outcome: Progressing  Goal: Rounds/Family Conference  Outcome: Progressing     Problem: Fall Injury Risk  Goal: Absence of Fall and Fall-Related Injury  Outcome: Progressing

## 2019-07-22 NOTE — Unmapped (Signed)
Hospitalist Daily Progress Note     LOS: 0 days       Assessment/Plan:  Principal Problem:    Alcohol withdrawal syndrome without complication (CMS-HCC)  Active Problems:    Depressive disorder    Reflux esophagitis    Tobacco use disorder    Prostate cancer (CMS-HCC)    Anxiety    COPD (chronic obstructive pulmonary disease) (CMS-HCC)    Chronic pancreatitis (CMS-HCC)  Resolved Problems:    * No resolved hospital problems. *         8.  59 year old African-American male recently discharged after admission for virtually identical presentation with alcohol withdrawal syndrome.  His CIWA scores are low and he is recovering promptly.  At his last discharge, he declined any outpatient treatment.  He does not have much insight into his need for treatment beyond his initial detox that we are able to offer him here in the hospital.  He is counseled in detail at bedside regarding the need to proceed with a 12-step program.  He is on 2 prescribed opiates chronically for pain related to his prostate cancer.  The use of these medications essentially precludes him being admitted to any inpatient treatment program because of the general prohibition, understandably, of the use of this category of medication in that setting.  He therefore would best be served with outpatient treatment for alcoholism.  We discussed, as before, that the combination of alcohol and his opiate pain medication could result in severe sedation resulting in life-threatening respiratory depression or vomiting with aspiration and suffocation.  Taper Librium off this evening and discharge home tomorrow with outpatient follow-up for alcoholism.  Discontinuing cannabis use is also recommended given the polypharmacy and potential drug interaction.    2.  Essential hypertension.  Continue current regimen with amlodipine.    3.  History of depression and anxiety.  Continue BuSpar.    4.  GERD.  PPI.    5.  Prostate cancer, no active issues.    6.  COPD, not in exacerbation.  He is not on oxygen.    7.  History of chronic pancreatitis, no abdominal pain or acute issues.  Continue pancreatic enzymes.    8.  Cannabinoid use.  Toxicology screen is positive for these substances.    9.  Chronic pain management from prostate cancer.  He is on a fentanyl patch and oxycodone.  Continue Megace.      *DVT prophylaxis: Subcutaneous Lovenox.    *Disposition: He may be ready to go in 24 to 48 hours.  Discharge to a treatment facility would be best.      Please page the [facility]) pager at [phone] with questions.          Subjective:   No complaint of tremor, nausea or vomiting.    Objective:       Vital signs in last 24 hours:  Temp:  [36.1 ??C (96.9 ??F)-36.6 ??C (97.9 ??F)] 36.1 ??C (96.9 ??F)  Heart Rate:  [65-74] 74  Resp:  [16-18] 16  BP: (134-162)/(87-95) 134/87  MAP (mmHg):  [110-122] 110  SpO2:  [99 %-100 %] 100 %    Intake/Output last 24 hours:  No intake or output data in the 24 hours ending 07/21/19 1740      Physical Exam:    Gen: Alert, NAD    HEENT: No icterus.    CV: Regular rate and rhythm.    PULM/chest: Clear to auscultation.    AVW:UJWJ, Non tender,  No palpable organomegaly    Ext: No C/C/E    Neuro: No acute focal deficits.    Skin: Warm, dry.      Medications:   Scheduled Meds:  ??? amLODIPine  10 mg Oral Daily   ??? arformoteroL  15 mcg Nebulization BID (RT)   ??? busPIRone  5 mg Oral TID   ??? chlordiazePOXIDE  25 mg Oral TID   ??? diclofenac sodium  2 g Topical 4x Daily   ??? enoxaparin (LOVENOX) injection  40 mg Subcutaneous Q24H   ??? fentaNYL  1 patch Transdermal Q72H   ??? folic acid  1 mg Oral Daily   ??? megestroL  400 mg Oral BID   ??? multivitamin, with zinc  1 tablet Oral Daily   ??? pancrelipase (Lip-Prot-Amyl)  48,000 units of lipase Oral 3xd Meals   ??? pantoprazole  20 mg Oral BID   ??? polyethylene glycol  17 g Oral Daily   ??? umeclidinium  1 puff Inhalation Daily (RT)

## 2019-07-22 NOTE — Unmapped (Signed)
Physician Discharge Summary    Admit date: 07/19/2019    Discharge date and time: 07/22/2019    Discharge to: Home    Discharge Service: Med Undesignated (MDX)    Discharge Attending Physician: Rica Koyanagi, MD    Discharge Diagnoses: Alcoholism with alcohol withdrawal syndrome.      Hospital Course: The patient is a 58 year old African-American male who was recently admitted to the hospital for alcohol withdrawal,  undergoing moderate withdrawal symptoms that resolved completely.  He also currently declined any outpatient follow-up or treatment is alcoholism.  He does have prostate cancer that is metastatic and is on chronic pain medications with fentanyl and oxycodone.    He presented to the ER intoxicated with a blood alcohol level of 195 mg/dL.  He requested admission for alcohol withdrawal and treatment.    He was admitted in the hospital, placed on CIWA protocol with as needed IV lorazepam and placed on scheduled Librium that was tapered off on the night prior to discharge.    At this time he is offered outpatient follow-up for alcoholism treatment.  Unfortunately, due to the opiate pain medication that he must take for his malignant pain secondary to prostate cancer, it is very difficult to find a facility that would treat him while remaining on these medications.  We have discussed in detail that the mixture of alcohol and opiate pain medications could lead to life-threatening sedation.  He also uses cannabinoids and is discouraged from continuing to use anything other than prescribed medications.    He is now discharged home in stable condition for outpatient follow-up for alcoholism.    He has chronic pancreatitis with symptomatically stable when he was continued on pancreatic enzymes.  No changes were made to the patient's home medication regimen.    His COPD was stable and his usual inhaler regimen was continued.  Unfortunately, he does continue to smoke, although not heavily and he is strongly encouraged to discontinue smoking.    He should follow-up routinely with his Primary Care clinic or sooner if needed for management of his COPD and high blood pressure.      Condition at Discharge: stable  Discharge Medications:      Your Medication List      CONTINUE taking these medications    albuterol 90 mcg/actuation inhaler  Commonly known as: PROVENTIL HFA;VENTOLIN HFA  INHALE 2 PUFFS EVERY 6 HOURS AS NEEDED FOR WHEEZING     amLODIPine 10 MG tablet  Commonly known as: NORVASC  Take 1 tablet (10 mg total) by mouth daily.     busPIRone 5 MG tablet  Commonly known as: BUSPAR  TAKE 1 TABLET BY MOUTH THREE TIMES A DAY     calcium carbonate 650 mg calcium (1,625 mg) tablet  Take 1 tablet (650 mg of elem calcium total) by mouth daily.     chlordiazePOXIDE 25 MG capsule  Commonly known as: LIBRIUM  1 tab this afternoon, 1 this evening, every 8 hours tomorrow and stop     CREON 24,000-76,000 -120,000 unit Cpdr delayed release capsule  Generic drug: pancrelipase (Lip-Prot-Amyl)  Take 48,000 units of lipase by mouth Three (3) times a day with a meal. Take an additional 1 capsule with snacks.     diclofenac sodium 1 % gel  Commonly known as: VOLTAREN  Apply 2 g topically Four (4) times a day. Any area of pain     diphenhydrAMINE 25 mg tablet  Commonly known as: BENADRYL  Take 0.5 tablets (12.5  mg total) by mouth every six (6) hours as needed for itching.     famotidine 20 MG tablet  Commonly known as: PEPCID  Take 1 tablet (20 mg total) by mouth two (2) times a day as needed for heartburn.     fentaNYL 25 mcg/hr patch  Commonly known as: DURAGESIC  Place 1 patch on the skin every third day.     magic mouthwash oral suspension  Take 10 mL by mouth 4 (four) times a day as needed.     megestroL 400 mg/10 mL (40 mg/mL) suspension  Commonly known as: MEGACE  Take 10 mL (400 mg total) by mouth Two (2) times a day.     melatonin 3 mg Tab  TK 1 T PO HS PRN     MULTIVITAMIN 50 PLUS Tab  Generic drug: multivitamin-minerals-lutein TK 1 T PO D     omeprazole 20 MG capsule  Commonly known as: PriLOSEC  Take 2 capsules (40 mg total) by mouth Two (2) times a day.     oxyCODONE 5 MG immediate release tablet  Commonly known as: ROXICODONE  Take 2 tablets (10 mg total) by mouth every four (4) hours as needed for pain.     prochlorperazine 10 MG tablet  Commonly known as: COMPAZINE  Take 1 tablet (10 mg total) by mouth every eight (8) hours as needed.     salmeteroL 50 mcg/dose diskus inhaler  Commonly known as: SEREVENT  Inhale 1 puff Two (2) times a day.     SPIRIVA WITH HANDIHALER 18 mcg inhalation capsule  Generic drug: tiotropium  Place 1 capsule (18 mcg total) into inhaler and inhale once daily.     traZODone 50 MG tablet  Commonly known as: DESYREL  Take 1 tablet (50 mg total) by mouth nightly as needed for sleep.     valACYclovir 500 MG tablet  Commonly known as: VALTREX  Take 1 tablet (500 mg total) by mouth daily.     XTANDI 40 mg capsule  Generic drug: enzalutamide  Take 4 capsules (160 mg total) by mouth daily.                   Discharge Instructions:   Activity Instructions     Activity as tolerated          Diet Instructions     Discharge diet (specify)      Discharge Nutrition Therapy: General        Other Instructions     Call MD for:  severe uncontrolled pain      Call MD for: Temperature > 38.5 Celsius ( > 101.3 Fahrenheit)      Discharge instructions to patient: Call your primary care doctor and make an appointment to see them:      Within 2 weeks from the time you are discharged from the hospital             Follow Up instructions and Outpatient Referrals     Call MD for:  severe uncontrolled pain      Call MD for: Temperature > 38.5 Celsius ( > 101.3 Fahrenheit)          Appointments which have been scheduled for you    Jul 30, 2019 11:00 AM  (Arrive by 10:45 AM)  PHONE with Lavena Stanford, LCSW  [facility] ALCOHOL AND SUBSTANCE ABUSE PROGRAM [place] [facility] REGION) [address] [place] [address] [phone]   Please DO NOT  come to the clinic for this visit. We will call you to discuss your plan of care.      Aug 04, 2019 11:00 AM  (Arrive by 10:45 AM)  RETURN  RESIDENT with Max Jeri Cos, MD  [facility] INTERNAL MEDICINE [place] [facility] REGION) [address] [place] [address]  [phone]      Aug 10, 2019 10:30 AM  (Arrive by 10:15 AM)  RETURN TELEPHONE with Neldon Labella, RD/LDN  [facility] INTERNAL MEDICINE [place] [facility] REGION) [address]  [place] [address]  [phone]      Aug 31, 2019  9:00 AM  (Arrive by 8:45 AM)  RETURN ACTIVE [place] with Towanda Malkin, MD  [facility]OGY [facility] HEMATOLOGY [facility] REGION) [address]  [place] [address]  [phone]      Aug 31, 2019  9:30 AM  (Arrive by 9:15 AM)  Danelle Earthly NURSE with [facility] INJECTION [facility]  [facility]OGY [facility]OLOG[facility] REGION) [address]  [facility] [place] [address]  [phone]              I spent greater than 30 minutes in the discharge of this patient.    Tawni Levy MD

## 2019-07-23 NOTE — Unmapped (Signed)
As requested, [name] is returned my call and we have reviewed the following concern:    ?? [name] confirms that he was discharged from [facility] on 07/22/2019 after receiving treatment for alcohol detoxification.    ?? The patient has confirmed that since being discharged, he has not consumed any alcohol thus far.    ?? The patient inquired if Dr. Claude Manges could prescribe him a medication that would keep him from desiring/thinking about consuming more alcohol.  ?? It was reviewed with the patient that a medication such as that, does not necessarily exist.  ?? [name] also confirmed receiving a course of Librium while in the hospital and was not clear if additional doses would be helpful for his condition.    ?? [name] anticipates following up with alcoholics anonymous early next week.    ?? As requested by [name], a message has been sent to Jennell Corner LCSW who works in conjunction with the primary care team/Dr. Lynett Fish from [facility] Internal Medicine to review that his follow-up care plan appropriately meets his needs/goals.    Otherwise, [name] expressed verbal understanding and had no other questions or concerns at this time and is agreeable to the plan outlined above.  He is to call the clinic back sooner should he have any other issues.    No other actions taken in Dr. Claude Manges is aware.

## 2019-07-26 NOTE — Unmapped (Signed)
A medication refill request has been received to the clinic electronically from [facility] in [place], [place] regarding the following:    ?? Doxycycline 100 mg capsules, dispense 28 with 0 refills.  ?? This medication was last prescribed by Dr. Claude Manges on 03/29/2019.    This refill request has been routed to Dr. Claude Manges for interpretation and review.

## 2019-08-02 NOTE — Unmapped (Signed)
[facility] Refill Coordination Note    Specialty Medication(s) to be Shipped:   General Specialty: Joseph Phillips 40mg     Other medication(s) to be shipped:       Joseph Phillips, DOB: May 05, 1961  Phone: [phone] (home)       All above HIPAA information was verified with patient.     Completed refill call assessment today to schedule patient's medication shipment from the [facility] [phone]).       Specialty medication(s) and dose(s) confirmed: Regimen is correct and unchanged.   Changes to medications: Joseph Phillips reports no changes at this time.  Changes to insurance: No  Questions for the pharmacist: No    Confirmed patient received Welcome Packet with first shipment. The patient will receive a drug information handout for each medication shipped and additional FDA Medication Guides as required.       DISEASE/MEDICATION-SPECIFIC INFORMATION        N/A    SPECIALTY MEDICATION ADHERENCE     Medication Adherence    Patient reported X missed doses in the last month: 0  Specialty Medication: xtandi 40mg   Patient is on additional specialty medications: No  Support network for adherence: family member                Xtandi 40 mg: 7 days of medicine on hand       SHIPPING     Shipping address confirmed in Epic.     Delivery Scheduled: Yes, Expected medication delivery date: 08/12.     Medication will be delivered via Next Day Courier to the home address in Epic WAM.    [name]   [facility] Specialty Technician

## 2019-08-03 MED FILL — XTANDI 40 MG CAPSULE: 30 days supply | Qty: 120 | Fill #1 | Status: AC

## 2019-08-03 MED FILL — XTANDI 40 MG CAPSULE: ORAL | 30 days supply | Qty: 120 | Fill #1

## 2019-08-04 ENCOUNTER — Institutional Professional Consult (permissible substitution)
Admit: 2019-08-04 | Discharge: 2019-08-05 | Payer: MEDICAID | Attending: Student in an Organized Health Care Education/Training Program | Primary: Student in an Organized Health Care Education/Training Program

## 2019-08-04 DIAGNOSIS — I10 Essential (primary) hypertension: Secondary | ICD-10-CM

## 2019-08-04 DIAGNOSIS — F10232 Alcohol dependence with withdrawal with perceptual disturbance: Principal | ICD-10-CM

## 2019-08-04 NOTE — Unmapped (Signed)
.  Previsit complete    Time spent on phone:     Reason for visit:  Follow up    Questions / Concerns that need to be addressed:    General Consent to Treat (GCT) for non-epic video visits only: Verbal consent    Vitals signs, if any: none    Allergies reviewed: Yes    Medication reviewed: Yes    Refills needed, if any: none    Travel Screening: completed    Falls Risk: completed    Hark (DV) Screening: completed    HCDM reviewed and updated in Epic:  Completed      HARK Screening  HARK Screening  Within the last year, have you been humiliated or emotionally abused in other ways by your partner or ex-partner?: No  Within the last year, have you been afraid of your partner or ex-partner?: No  Within the last year, have you been raped or forced to have any kind of sexual activity by your partner or ex-partner?: No  Within the last year, have you been kicked, hit, slapped, or otherwise physically hurt by your partner or ex-partner?: No

## 2019-08-04 NOTE — Unmapped (Signed)
Internal Medicine Phone Visit    This visit is conducted via telephone.    Contact Information  Person Contacted:self  Contact Phone number: [phone] (home)   Is there someone else in the room? No.   Patient agreed to a phone visit    Joseph Phillips is a 58 y.o. male  participating in a telephone encounter.    Reason for Call  Follow up hospitalizations    Subjective:  Joseph Phillips is a 58 yo M with hx of Etoh abuse, stage IV prostate cancer, and three recent admissions within the last month for 1) alcohol induced gastritis/withdrawal on 6/30 and 2) EtOH withdrawal (7/17-7/18) and 3) EtOH withdrawal (7/27-7/30). It was attempted to find an outpt facility for his EtOH treatment however due to pt's metastatic bone pain he requires oxycodone and fentanyl patch which made placement challenging.    Today, pt denies EtOH use since his hospitalization. Stated that he has no interest in going back to drinking or going to support groups. Wants to do it on his own. Still using marijuana.    Regarding his COPD, pt denies SOB/DOE. Picking up his spiriva inhaler today.    Pain well controlled.    I have reviewed the problem list, medications, and allergies and have updated/reconciled them if needed.    Objective:  Telephone visit    Assessment & Plan:  1. 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 Furthermore, placement into an inpatient withdrawal service very challenging. Offered pt the number to [facility] ph# is [phone].       2.  COPD: asymptomatic. Ctn spiriva        Dakoda [employer] Jeri Cos, MD PGY-3  Internal Medicine    Return in about 3 months (around 11/04/2019) for Recheck.     Staffed with: Spero Curb    I spent 21 minutes on the phone with the patient. I spent an additional 3 minutes on pre- and post-visit activities.     The patient was physically located in [place] or a state in which I am permitted to provide care. The patient and/or parent/guardian understood that s/he may incur co-pays and cost sharing, and agreed to the telemedicine visit. The visit was reasonable and appropriate under the circumstances given the patient's presentation at the time.    The patient and/or parent/guardian has been advised of the potential risks and limitations of this mode of treatment (including, but not limited to, the absence of in-person examination) and has agreed to be treated using telemedicine. The patient's/patient's family's questions regarding telemedicine have been answered.     If the visit was completed in an ambulatory setting, the patient and/or parent/guardian has also been advised to contact their provider???s office for worsening conditions, and seek emergency medical treatment and/or call 911 if the patient deems either necessary.

## 2019-08-05 NOTE — Unmapped (Addendum)
Thank you for coming to clinic today.    [facility] ph# is [phone]

## 2019-08-09 NOTE — Unmapped (Signed)
Hi,    Patient Joseph Phillips called requesting a medication refill for the following:    ? Medication: roxicdone  ? Dosage: 5mg   ? Days left of medication: 0  ? Pharmacy: [facility]  ?   The expected turnaround time is 3-4 business days     Check Indicates criteria has been reviewed and confirmed with the patient:    [x]  Preferred Name   [x]  DOB and/or MR#  [x]  Preferred Contact Method  [x]  Phone Number(s)   [x]  Preferred Pharmacy   []  MyChart     Thank you,  [name]  [facility]  [phone]

## 2019-08-09 NOTE — Unmapped (Signed)
Sent to ENT in error

## 2019-08-10 ENCOUNTER — Institutional Professional Consult (permissible substitution): Admit: 2019-08-10 | Discharge: 2019-08-11 | Payer: MEDICAID | Attending: Registered" | Primary: Registered"

## 2019-08-10 DIAGNOSIS — C61 Malignant neoplasm of prostate: Principal | ICD-10-CM

## 2019-08-10 NOTE — Unmapped (Signed)
Patient Stated Health/Nutrition Goals:  Meet nutritional needs   Reduce long-term health risk    Identified treatment goals: Not applicable    Nutrition Interventions (ADVISE/ASSIST):  1. When you first get up have a few crackers and a small glass of Ginger Ale  2. Continue to reduce alcohol: preferably none at all  3. Try to have small snacks throughout the day:   A. Peanut butter on crackers   B. Tuna/chicken salad on crackers   C. Cheese toast   D. Canned fruit   E. Vegetables   F. Pimento cheese and crackers  4. Try out [employer] mixed in whole milk

## 2019-08-10 NOTE — Unmapped (Signed)
Hi,  ??  Patient Joseph Phillips called requesting a medication refill for the following:  ??  ?? Medication: roxicdone  ?? Dosage: 5mg   ?? Days left of medication: 0  ?? [facility]acy[facility]    The expected turnaround time is 3-4 business days     Thank you,   Kelli Hope  [facility] Communication [facility]  [phone]

## 2019-08-10 NOTE — Unmapped (Signed)
Enhanced Care Nutrition-Follow Up Phone Assessment    Nutrition Assessment:  Patient has had a few more hospital visits due to alcohol. He reports again he's off it and doesn't have a taste for it. He also reports he wants to do it on his own. Nutritionally patient is likely depleted. Ensure continues to be a cost burden. Provided patient with numbers to call for patient assistant programs. Also discussed trying CIB as it is much cheaper. Encouraged patient to be taking in small amounts at a time as his appetite likely will not get better.    Nutrition Diagnosis:  Undesirable food choices related to poor appetite as evidenced by skipping meals, eating highly processed foods    Patient Stated Health/Nutrition Goals:  Meet nutritional needs   Reduce long-term health risk    Identified treatment goals: Not applicable    Nutrition Interventions (ADVISE/ASSIST):  1. When you first get up have a few crackers and a small glass of Ginger Ale  2. Continue to reduce alcohol: preferably none at all  3. Try to have small snacks throughout the day:   A. Peanut butter on crackers   B. Tuna/chicken salad on crackers   C. Cheese toast   D. Canned fruit   E. Vegetables   F. Pimento cheese and crackers  4. Try out [employer] mixed in whole milk    ______________________________________________________________________  Joseph Phillips is a 58 y.o. male seen for medical nutrition therapy.    Referring MD or Clinic:   Self, Referred  Max Jeri Cos, MD    Reason for Referral:   1. Prostate cancer (CMS-HCC)        Medical History:  Past Medical History:   Diagnosis Date   ??? Alcohol abuse    ??? COPD (chronic obstructive pulmonary disease) (CMS-HCC)    ??? Hx of radiation therapy    ??? Hypertension    ??? Polysubstance abuse (CMS-HCC)    ??? Prostate cancer (CMS-HCC)    ??? Seizures (CMS-HCC)        Anthropometrics:    Present Weight   Current BMI     Goal Weight   Current Current IBW     Present Height         Weight History: Wt Readings from Last 6 Encounters:   07/19/19 73.5 kg (162 lb)   07/12/19 73.5 kg (162 lb 1.6 oz)   07/09/19 75.5 kg (166 lb 6.4 oz)   06/28/19 73.8 kg (162 lb 12.8 oz)   06/22/19 74.8 kg (165 lb)   06/04/19 73.6 kg (162 lb 4.1 oz)       Medications, Herbs, Supplements:  Reviewed nutritionally relevant medications and supplements.    Recent Labs:   Hemoglobin A1C (%)   Date Value   03/23/2014 4.8   04/17/2012 5.1      No components found for: LDLCALC   BP Readings from Last 3 Encounters:   07/22/19 145/91   07/12/19 116/70   07/10/19 133/76     Lab Results   Component Value Date    CHOL 98 (L) 09/26/2018    CHOL 125 03/23/2014    CHOL 129 09/11/2011     Lab Results   Component Value Date    HDL 36 (L) 09/26/2018    HDL 44 03/23/2014    HDL 45 09/11/2011     Lab Results   Component Value Date    LDL 33 (L) 09/26/2018    LDL <30 03/23/2014    LDL  62 09/11/2011     Lab Results   Component Value Date    VLDL 29.4 09/26/2018     Lab Results   Component Value Date    CHOLHDLRATIO 2.7 09/26/2018     Lab Results   Component Value Date    TRIG 147 09/26/2018    TRIG 142 09/11/2011       Physical Activity:  Tries to walk a little    Dietary Restrictions, Food Intolerances:  Bread-feels like it gets stuck    Other GI Issues:  Nausea in the morning    Allergies:   Allergies   Allergen Reactions   ??? Penicillins      Other reaction(s): UNKNOWN   ??? 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    Behavioral Risk Factors:  Sleep- N/A  Fast Eating  N/A  Overeating  N/A    Hunger and Satiety Issues:  Appetite: Poor    Patient Questions:  None    Estimated Needs:  Estimated Energy Needs: 1800-2100 kcal/day    Estimated Protein Needs: 65-75 gm/day    Materials Provided To Meet their identified goals:  List of recommendations    Food labels discussed: Yes    Expected Compliance/Barriers are:   Comprehension of plan fair  Readiness for change fair  Ability to meet goals fair    We assessed family/social/cultural characteristics and these were relevant to care: none evident    Patient has auditory or visual communication barrier or need: no    Interventions Codes:  General/healthful diet     Follow-up (ARRANGE):  6 weeks      Length of visit was 22 minuntes. Patient understands diagnosis and treatment plan. Patient voices understanding. Plan is consistent with the patient???s preferences and goals.    This visit is conducted via telephone conferencing.    Contact Information  Person Contacted: Patient  Contact Phone number: [phone] (home)   Is there someone else in the room? No.     The patient was physically located in [place] or a state in which I am permitted to provide care. The patient and/or parent/gauardian understood that s/he may incur co-pays and cost sharing, and agreed to the telemedicine visit. The visit was completed via phone and/or video, which was appropriate and reasonable under the circumstances given the patient's presentation at the time.    The patient and/or parent/guardian has been advised of the potential risks and limitations of this mode of treatment (including, but not limited to, the absence of in-person examination) and has agreed to be treated using telemedicine. The patient's/patient's family's questions regarding telemedicine have been answered.     If the phone/video visit was completed in an ambulatory setting, the patient and/or parent/guardian has also been advised to contact their provider???s office for worsening conditions, and seek emergency medical treatment and/or call 911 if the patient deems either necessary.

## 2019-08-10 NOTE — Unmapped (Signed)
Please help schedule with Dr. Heide Scales on 11/11       Please help schedule with Dr. Heide Scales on 11/11     Patient confirmed best#[phone],,,bvr 08/10/2019

## 2019-08-10 NOTE — Unmapped (Signed)
I was the supervising physician in the delivery of the service. Katherin Ramey E Kimel-Scott, MD

## 2019-08-11 NOTE — Unmapped (Signed)
Hi,    Patient Joseph Phillips called again requesting a medication refill for the following:    ? Medication: roxicodone   ? Dosage: 5mg   ? Days left of medication: 0  ? Pharmacy: [facility]    I'm in pain. I need them bad. I've been in pain for the last few days.     The expected turnaround time is 3-4 business days     Check Indicates criteria has been reviewed and confirmed with the patient:    [x]  Preferred Name   [x]  DOB and/or MR#  [x]  Preferred Contact Method  [x]  Phone Number(s)   [x]  Preferred Pharmacy   []  MyChart     Thank you,  [name]  [facility]  [phone]

## 2019-08-11 NOTE — Unmapped (Signed)
Pt calling states he is out of pain medication and would like a refill of Roxicodone 5mg  sent to [facility] [place], [place].    Advised pt would send request to Dr. Claude Manges.

## 2019-08-12 NOTE — Unmapped (Signed)
As directed by Dr. Claude Manges, I have contacted the patient to review with him the following:    ?? I have clarified with the patient that at this time, Dr. Claude Manges is NOT authorizing any more refills of his oxycodone prescription.    ?? The patient expressed a desire to speak with Dr. Claude Manges and insisted that he is continued to experience pain.    This concern is been routed to Dr. Claude Manges for review.    No other actions taken.

## 2019-08-13 MED ORDER — PROCHLORPERAZINE MALEATE 10 MG TABLET
ORAL_TABLET | 1 refills | 0 days | Status: CP
Start: 2019-08-13 — End: ?

## 2019-08-13 NOTE — Unmapped (Signed)
Mrs. Joseph Phillips is calling from [facility] she is needing to speak to you about patient and the Ensure (Drink). The best number to call her back on is [phone], thank you.

## 2019-08-13 NOTE — Unmapped (Signed)
As directed by Dr. Claude Manges, I have contacted the patient back to review with him the following:    ?? Joseph Phillips is in agreement with having a telephone encounter with Dr. Claude Manges on Monday, August 16, 2019 at 4 PM to address his pain management concerns.    ?? In addition, patient has mentioned that he will need to reschedule his injection and clinic visit appointment with Dr. Claude Manges tentatively set for 09/01/2019 to the week of 09/06/2019 as a result of coordinating transportation.  ?? This request has been routed to the scheduling team to review.    Otherwise, Joseph Phillips expressed verbal understanding and had no other questions or concerns at this time and is agreeable to the plan outlined above.  He is to call the clinic back sooner should he have any other issues.

## 2019-08-16 ENCOUNTER — Institutional Professional Consult (permissible substitution)
Admit: 2019-08-16 | Discharge: 2019-08-17 | Payer: MEDICAID | Attending: Hematology & Oncology | Primary: Hematology & Oncology

## 2019-08-16 DIAGNOSIS — C61 Malignant neoplasm of prostate: Principal | ICD-10-CM

## 2019-08-16 MED ORDER — FENTANYL 25 MCG/HR TRANSDERMAL PATCH
MEDICATED_PATCH | TRANSDERMAL | 0 refills | 30 days | Status: CP
Start: 2019-08-16 — End: ?

## 2019-08-16 MED ORDER — OXYCODONE 5 MG TABLET
ORAL_TABLET | ORAL | 0 refills | 9.00000 days | Status: CP | PRN
Start: 2019-08-16 — End: ?

## 2019-08-16 MED ORDER — MEGESTROL 400 MG/10 ML (40 MG/ML) ORAL SUSPENSION
Freq: Two times a day (BID) | ORAL | 12 refills | 24.00000 days | Status: CP
Start: 2019-08-16 — End: 2020-08-15

## 2019-08-16 NOTE — Unmapped (Signed)
Hi,    Patient Joseph Phillips called requesting a medication refill for the following:    ? Medication: trazodone  ? Dosage: 50mg   ? Days left of medication: 0  ? Pharmacy: [facility]    ? Medication: Melatonin  ? Dosage: 3mg   ? Days left of medication: 0  ? Pharmacy: [facility]    The expected turnaround time is 3-4 business days       Check Indicates criteria has been reviewed and confirmed with the patient:    [x]  Preferred Name   [x]  DOB and/or MR#  [x]  Preferred Contact Method  [x]  Phone Number(s)   [x]  Preferred Pharmacy   []  MyChart     Thank you,  Jannette Spanner  [facility]  [phone]

## 2019-08-16 NOTE — Unmapped (Signed)
Called and left a message requesting a call back.

## 2019-08-16 NOTE — Unmapped (Signed)
Per message received by Colletta Maryland, I attempted to contact patient to give him new date and time for his September appointment with Dr. Claude Manges.  Patient requested appointment be moved to week of 9/14.  Patient is now scheduled to see Dr. Claude Manges for labs, injection and FTF visit on Tuesday, 9/15 @1 :00.  The patient's vmb has not been set up, therefore I was unable to leave a message.     Contacted patient's brother, [name], and was able to relay appointment information.

## 2019-08-16 NOTE — Unmapped (Signed)
C/o shoulder blade pain.

## 2019-08-16 NOTE — Unmapped (Signed)
Initial Visit Note    Patient Name: Joseph Phillips  Patient Age: 58 y.o.  Encounter Date: 08/16/2019    Referring Physician:     Towanda Malkin, MD  [address]  [address] [facility] [phone]  [place],  [place] [address]    Primary Care Provider:  Unknown Foley, MD    Assessment/Plan:    Reason for visit:  Prostate Cancer    Cancer Staging  Prostate cancer (CMS-HCC)  Staging form: Prostate, AJCC 7th Edition  - Clinical: Stage IV (T3a, N0, M1b, PSA: Less than 10, Gleason 7) - Unsigned      --Prostate cancer, stage IV,      -- with progression on Lupron/Zytiga     --07/14/18: Docetaxel/prednisone     --11/11/2018: Neutropenic fever, with possible UTI and oral mucositis     -- 02/03/19: [name];  Held 03/01/19 for LE swelling/cellulitis; restart 03/29/19 at 80 mg qD  --Hypokalemia  --Hypomagnesemia  -- Alcoholism      -- 06/22/19: Admitted for alcoholic gastritis  --Leg cramps and pain, exacerbated by electrolyte disturbances      --03/01/19: Skin changes in the lower extremities of uncertain etiology.  --Teeth in poor repair, awaiting dental correction  --Hypertension  --Lower extremity swelling, exacerbated at night; improved on triamterene-HCTZ  -- OSA; positive study for both central and obstructive apnea; CPAP trial recommended, 07/23/2018; deferred by the patient  --History of pancreatitis with pancreatic insufficiency; on Creon; 2/2 EtOHism      Recommendation  --Patient will continue on Xtandi 160 mg daily.  --Patient will be restarted on oxycodone 5 mg every 6 hours as needed, and fentanyl 25 mcg topically every 3 days  --Referral will be made to the pain clinic for advice on management  --The patient understands that if he drinks again, or uses other nonprescribed medications, we will stop writing all narcotic prescriptions.  --Megace was refilled and the patient advised to restart it 400 mg twice daily  --Return in September 5 as scheduled for his next Lupron shot, will be checking PSA at that time  --I spent 10 minutes on the phone with the patient. I spent an additional 20 minutes on pre- and post-visit activities.     The patient was physically located in [place] or a state in which I am permitted to provide care. The patient and/or parent/guardian understood that s/he may incur co-pays and cost sharing, and agreed to the telemedicine visit. The visit was reasonable and appropriate under the circumstances given the patient's presentation at the time.    The patient and/or parent/guardian has been advised of the potential risks and limitations of this mode of treatment (including, but not limited to, the absence of in-person examination) and has agreed to be treated using telemedicine. The patient's/patient's family's questions regarding telemedicine have been answered.           I have reviewed the laboratory, pathology, and radiology reports in detail and discussed findings with the patient    History of Present Illness:     Joseph Phillips is a 58 y.o. male who is seen in consultation at the request of Lonn Georgia* for an evaluation of Prostate Cancer.  The patient was contacted for a phone telemedicine visit.  After I verifying that he was in his room, and there is no one else present during the interview, identified myself, gave my credentials, we discussed the capabilities and limitations of a phone telemedicine visit.  We mutually agreed the session was appropriate.  Patient understands that he may incur costs as he does for a regular clinic visit.  Since patient's last clinic visit in early July, he was admitted again for alcohol use.  He states that he has not drunk since that hospitalization.  He was discharged on July 30 (although he estimated that he had been discharged several months previously).  He states he has been using both friends oxycodone and fentanyl to get by, but is having a lot of pain in his shoulder blades.  When he was admitted to the hospital, his urine drug screen was positive for narcotics, in addition to benzodiazepines and cannabinoids, and alcohol.  He states that he is passing urine without difficulty.  His appetite is poor.  He was uncertain whether he was taking Megace or not, and believed that he might be out of that medication.  He denies headaches, confusion, shortness of breath, cough, other bone pain, bowel or bladder dysfunction.  He is willing to see someone in the pain clinic for evaluation of his pain and its management.  He would like his pain medications refilled.  He believes he is taking his Xtandi as 4 tablets daily, without difficulty.         Oncology History:  Patient was originally diagnosed with prostate cancer in 2006.  He underwent robot-assisted prostatectomy.  He did well until May 2018, when his PSA rose to a high of about 45, and he was started on Lupron, initially with Casodex, and then with apparatus.  His PSA fell and remained low until recently when he was noted to be rising from a low of 0.82 on  01/01/18, to 3.99 on 06/29/2018.  He was instructed to hold Zytiga.  Since holding Zytiga he is noted swelling in his ankles.  The swelling comes on overnight, improves during the course the day.  He was evaluated with Doppler studies on 06/29/2018 which showed no evidence of blood clots.  He is still bothered by the swelling.  Restaging studies done on 06/04/2018 including bone scan, and CT of the chest abdomen pelvis, showed sclerotic changes throughout the axial skeleton, and involvement also of scapula, some ribs and pelvis.  The sites were new since 2016, and also probably new since the CT stone study in May 2018.  Bone scan also showed some progression in disease by report, compared to 2018.  He has developed pain in the back of his calves, for which he takes oxycodone (Tylenol and ibuprofen were ineffective).  The pain followed prolonged episodes of cramping, felt to be related to low magnesium and potassium.  About 6 months ago he had shingles in a left L2 distribution.        Oncology History   Prostate cancer (CMS-HCC)   12/02/2008 Biopsy    Prostate, left apex, needle biopsy  - Adenocarcinoma, Gleason combined score 6 (3+3), 2 of 2 cores involved, tumor  approximately 2 mm in greatest diameter, approximately 10% of tissue involved by  carcinoma.  - See comment    E: ??Prostate, left mid, needle biopsy  - Adenocarcinoma, Gleason combined score 6 (3+3), 1 of 2 cores involved, tumor  approximately 2 mm in greatest diameter, approximately 5% of tissue involved by  carcinoma.  - See comment    F: ??Prostate, left base, needle biopsy  - Adenocarcinoma, Gleason combined score 6 (3+3), 1 of 2 cores involved, tumor  approximately 1 mm in greatest diameter, approximately 5% of tissue involved by  carcinoma.     01/24/2009  Surgery    Prostate, robot assisted laparoscopic prostatectomy  Tumor histologic type: Prostatic adenocarcinoma  Tumor grade (Gleason system): Gleason pattern 7 (3+4)  Approximate percentage of prostate involved by tumor: 40%  Extent of tumor:  Location of tumor involvement in prostate: right and left lobes, anterior  and posterior, apex to base  Confinement/non-confinement of tumor within the prostatic capsule: ??not  confined  Location of extracapsular tumor involvement: ??left prostate toward the base  (ie. block A22)  Type of extracapsular tissue involved within tumor: ?? adipose  tissue/perineural tissue  Angiolymphatic space invasion: not identified  Seminal vesicles: negative  Vas deferens: negative  Histologic assessment of surgical margins: positive margin along a 3 mm  face in the left prostate base (block A25)    Other significant findings: High grade prostatic intraepithelial  Neoplasm;  Lymph nodes: Not applicable  AJCC Staging: pT3a pNx     08/30/2013 Initial Diagnosis    Prostate cancer (CMS-HCC)     04/30/2017 -  Chemotherapy    Lupron 22.5 mg; with bicalutamide x  month     08/01/2017 - 06/30/2018 Chemotherapy    Abiaterone 1000 mg daily; decrease to 750 mg daily at 10/2018;     10/30/2017 -  Cancer Staged    Starta: TP53, TMPSSR  -EGR fusion; no other abnormalities identified     01/14/2018 -  Cancer Staged    CT Chest:  Numerous sclerotic metastases of the thoracic spine, left scapula  and multiple ribs.  3. Coronary artery and aortic atherosclerosis (ICD10-I70.0).  4. Previously demonstrated left renal mass is not included in the  field of view.     06/04/2018 -  Cancer Staged    CT CAP:  Scattered diffuse sclerotic metastatic lesions are seen throughout the spine as well as the left scapula; some of the lesions are new since prior CT dated 04/20/2015.  - No intrathoracic metastatic disease.  Interval progression of osseous metastases throughout the spine and pelvis compared to 04/22/2017.  -- Cholelithiasis.  -- Hepatic steatosis.     06/04/2018 -  Cancer Staged    Bone scan:  Increased radiotracer uptake throughout the spine, sacrum, and left ilium, consistent with numerous metastases better evaluated on same-day CT. Unchanged metastasis to the left proximal humerus.     07/14/2018 -  Chemotherapy    OP PROSTATE DOCETAXEL/PREDNISONE  Docetaxel 75 mg/m2 evry 21 days     01/07/2019 -  Cancer Staged    CTA Chest:  No evidence of pulmonary embolism.  2. Occlusion of the medial basilar segmental bronchus of the left  lower lobe, nonspecific though could be seen in the setting of  aspiration. Otherwise, no acute findings.  3. Apical predominant paraseptal emphysematous change with suspected  mild progression of smoking related fibrosis with subpleural  reticulation but without evidence of honeycombing. Emphysema  (ICD10-J43.9).  4. Progression of osseous metastatic disease since 11/2017. Detailed  above. No definitive pathologic compression fractures.  5. Hepatic steatosis. 6. Coronary artery calcifications. Aortic Atherosclerosis     01/20/2019 -  Cancer Staged    NM Bone scan: Redemonstrated multiple abnormal foci throughout the appendicular axial skeleton, with interval increase in the extent of the T10 vertebral body lesion and new lesions involving the manubrium, left fifth and seventh ribs. This is most consistent with progression of osseous metastatic disease.     02/03/2019 -  Chemotherapy    Xtandi 160 mg qD; held after 1 month due to swelling.  Restart at 80 mg qD  on 03/29/19     03/10/2019 -  Cancer Staged    CTA chest:  No evidence of acute aortic intramural hematoma or dissection.  Multifocal osseous sclerotic metastatic disease, progressed when compared to prior CT 06/04/2018 and grossly similar to most recent nuclear medicine bone scan.  Mucoid impacted airways in left lower lobe with adjacent subtle ground glass opacities; likely represents sequelae of aspiration. Recommend follow up CT in 6 to 8 weeks to ensure clearing.  Other chronic/incidental findings as above, not substantially changed.         Past Medical History:   Diagnosis Date   ??? Alcohol abuse    ??? COPD (chronic obstructive pulmonary disease) (CMS-HCC)    ??? Hx of radiation therapy    ??? Hypertension    ??? Polysubstance abuse (CMS-HCC)    ??? Prostate cancer (CMS-HCC)    ??? Seizures (CMS-HCC)       Past Surgical History:   Procedure Laterality Date   ??? IR INSERT PORT AGE GREATER THAN 5 YRS  07/15/2018    IR INSERT PORT AGE GREATER THAN 5 YRS 07/15/2018 Carolin Coy, MD [facility]   ??? PR COLSC FLX W/RMVL OF TUMOR POLYP LESION SNARE TQ N/A 08/16/2015    Procedure: COLONOSCOPY FLEX; W/REMOV TUMOR/LES BY SNARE;  Surgeon: Gwen Pounds, MD;  Location: [facility];  Service: Gastroenterology   ??? PR COLSC FLX WITH DIRECTED SUBMUCOSAL NJX ANY SBST N/A 08/16/2015    Procedure: COLONOSCOPY, FLEXIBLE, PROXIMAL TO SPLENIC FLEXURE; WITH DIRECTED SUBMUCOSAL INJECTION(S), ANY SUBSTANCE;  Surgeon: Gwen Pounds, MD;  Location: [facility];  Service: Gastroenterology   ??? PROSTATE SURGERY          Family History   Problem Relation Age of Onset   ??? Heart disease Mother    ??? Diabetes Mother    ??? Diabetes Father    ??? Melanoma Neg Hx    ??? Basal cell carcinoma Neg Hx    ??? Squamous cell carcinoma Neg Hx       Family Status   Relation Name Status   ??? Mother  (Not Specified)   ??? Father  (Not Specified)   ??? Neg Hx  (Not Specified)     Social History     Occupational History   ??? Occupation: DISABLED   Tobacco Use   ??? Smoking status: Current Every Day Smoker     Packs/day: 0.50     Years: 40.00     Pack years: 20.00     Types: Cigarettes     Start date: 05/09/1994   ??? Smokeless tobacco: Never Used   ??? Tobacco comment: Pt smokes 10cpd, pt expressed desire to quit    Substance and Sexual Activity   ??? Alcohol use: Not Currently     Alcohol/week: 36.0 standard drinks     Types: 36 Cans of beer per week     Comment: states 3 or 4 big beers a day every day   ??? Drug use: Not Currently     Types: Marijuana   ??? Sexual activity: Not Currently       Allergies   Allergen Reactions   ??? Penicillins      Other reaction(s): UNKNOWN   ??? Tramadol Dizziness       Current Outpatient Medications   Medication Sig Dispense Refill   ??? albuterol HFA 90 mcg/actuation inhaler INHALE 2 PUFFS EVERY 6 HOURS AS NEEDED FOR WHEEZING 8.5 g 0   ???  amLODIPine (NORVASC) 10 MG tablet Take 1 tablet (10 mg total) by mouth daily. 30 tablet 11   ??? busPIRone (BUSPAR) 5 MG tablet TAKE 1 TABLET BY MOUTH THREE TIMES A DAY 270 tablet 3   ??? calcium carbonate 650 mg calcium (1,625 mg) tablet Take 1 tablet (650 mg of elem calcium total) by mouth daily. 30 tablet 11   ??? chlordiazePOXIDE (LIBRIUM) 25 MG capsule 1 tab this afternoon, 1 this evening, every 8 hours tomorrow and stop 5 capsule 0   ??? diclofenac sodium (VOLTAREN) 1 % gel Apply 2 g topically Four (4) times a day. Any area of pain 200 g 5   ??? diphenhydrAMINE (BENADRYL) 25 mg tablet Take 0.5 tablets (12.5 mg total) by mouth every six (6) hours as needed for itching. 30 capsule 0   ??? diphenhydramine HCl (MAGIC MOUTHWASH ORAL) suspension Take 10 mL by mouth 4 (four) times a day as needed. 120 mL 1   ??? enzalutamide (XTANDI) 40 mg capsule Take 4 capsules (160 mg total) by mouth daily. 120 capsule 5   ??? famotidine (PEPCID) 20 MG tablet Take 1 tablet (20 mg total) by mouth two (2) times a day as needed for heartburn. 60 tablet 0   ??? fentaNYL (DURAGESIC) 25 mcg/hr patch Place 1 patch on the skin every third day. 10 patch 0   ??? megestroL (MEGACE) 400 mg/10 mL (40 mg/mL) suspension Take 10 mL (400 mg total) by mouth Two (2) times a day. 480 mL 12   ??? melatonin 3 mg Tab TK 1 T PO HS PRN     ??? MULTIVITAMIN 50 PLUS Tab TK 1 T PO D     ??? oxyCODONE (ROXICODONE) 5 MG immediate release tablet Take 2 tablets (10 mg total) by mouth every four (4) hours as needed for pain. 50 tablet 0   ??? pancrelipase, Lip-Prot-Amyl, (CREON) 24,000-76,000 -120,000 unit CpDR delayed release capsule Take 48,000 units of lipase by mouth Three (3) times a day with a meal. Take an additional 1 capsule with snacks.     ??? prochlorperazine (COMPAZINE) 10 MG tablet TAKE 1 TABLET(10 MG) BY MOUTH EVERY 8 HOURS AS NEEDED 30 tablet 1   ??? salmeteroL (SEREVENT) 50 mcg/dose diskus inhaler Inhale 1 puff Two (2) times a day. 1 Inhaler 12   ??? tiotropium (SPIRIVA WITH HANDIHALER) 18 mcg inhalation capsule Place 1 capsule (18 mcg total) into inhaler and inhale once daily. 90 capsule 3   ??? valACYclovir (VALTREX) 500 MG tablet Take 1 tablet (500 mg total) by mouth daily. 90 tablet 3   ??? traZODone (DESYREL) 50 MG tablet Take 1 tablet (50 mg total) by mouth nightly as needed for sleep. 90 tablet 3     No current facility-administered medications for this visit.        The following portions of the patient's history were reviewed and updated as appropriate: allergies, current medications, past family history, past medical history, past social history, past surgical history and problem list.     Review of Systems   Constitutional: Positive for fatigue. Negative for appetite change, chills, diaphoresis, fever and unexpected weight change.   HENT:   Negative for mouth sores, nosebleeds and trouble swallowing.    Eyes: Negative for eye problems.   Respiratory: Negative for chest tightness, cough, hemoptysis and shortness of breath.    Cardiovascular: Negative for chest pain and leg swelling.   Gastrointestinal: Positive for abdominal pain, diarrhea, nausea and vomiting. Negative for blood in stool and  constipation.   Genitourinary: Negative for difficulty urinating and hematuria.    Musculoskeletal: Positive for back pain and myalgias. Negative for arthralgias.   Skin: Negative for rash.   Neurological: Negative for extremity weakness, headaches and seizures.   Hematological: Negative for adenopathy.   Psychiatric/Behavioral: Negative for confusion.   All other systems reviewed and are negative.        Vital Signs for this encounter:  Phone telemedicine visit  BSA: There is no height or weight on file to calculate BSA.  There were no vitals taken for this visit.           Physical Exam   Constitutional: He is oriented to person, place, and time.   Phone telemedicine visit; the remainder this physical exam is from the patient's last clinic visit is retained for reference purposes.   HENT:   Head: Normocephalic.   Eyes: Pupils are equal, round, and reactive to light. EOM are normal.   Cardiovascular: Normal rate, regular rhythm and normal heart sounds.   Pulmonary/Chest: Effort normal and breath sounds normal.   Abdominal: Soft. Bowel sounds are normal. He exhibits no distension and no mass. There is abdominal tenderness.   Patient is tender in the right upper and midline abdomen to the patient.  No masses, guarding noted.   Musculoskeletal:         General: No tenderness.      Comments: There is trace  edema in bilateral feet and lower extremities about midway up the calf.  Skin is bound down.  No erythema, tenderness.   Lymphadenopathy:     He has no cervical adenopathy.   No adenopathy neck, supraclavicular, axillary inguinal regions.   Neurological: He is alert and oriented to person, place, and time.   Skin: No rash noted.   Psychiatric: He has a normal mood and affect. His behavior is normal.       Karnofsky/Lansky Performance Status  80, Normal activity with effort; some signs or symptoms of disease (ECOG equivalent 1)    Results:    WBC   Date Value Ref Range Status   07/19/2019 7.4 4.5 - 11.0 10*9/L Final   04/17/2012 7.6 4.5 - 11.0 x10 9th/L Final     HGB   Date Value Ref Range Status   07/19/2019 10.8 (L) 13.5 - 17.5 g/dL Final   16/09/9603 54.0 13.5 - 17.5 G/DL Final     HCT   Date Value Ref Range Status   07/19/2019 31.9 (L) 41.0 - 53.0 % Final   04/17/2012 39.2 (L) 41.0 - 53.0 % Final     Platelet   Date Value Ref Range Status   07/19/2019 243 150 - 440 10*9/L Final   04/17/2012 242 150 - 440 x10 9th/L Final     LDH   Date Value Ref Range Status   05/27/2019 763 (H) 338 - 610 U/L Final     Creatinine Whole Blood, POC   Date Value Ref Range Status   07/25/2014 1.6 (H) 0.8 - 1.4 mg/dL Final     Creatinine, Whole Blood   Date Value Ref Range Status   07/25/2014 1.4 0.8 - 1.4 mg/dL Final     Creatinine   Date Value Ref Range Status   07/21/2019 0.74 0.70 - 1.30 mg/dL Final   98/10/9146 8.29 0.70 - 1.30 mg/dL Final     AST   Date Value Ref Range Status   07/19/2019 77 (H) 19 - 55 U/L Final   09/11/2011 38  19 - 55 U/L Final     PSA Diagnostic   Date Value Ref Range Status   09/28/2014 4.4 (H) 0.0 - 4.0 ng/mL Final   05/30/2014 5.0 (H) 0.0 - 4.0 ng/mL Final   03/23/2014 4.6 (H) 0.0 - 4.0 ng/mL Final   10/04/2013 3.7 0.0 - 4.0 ng/mL Final   06/24/2013 2.6 0.0 - 4.0 ng/mL Final   01/18/2013 1.6 0.0 - 4.0 NG/ML Final   09/28/2012 1.5 0.0 - 4.0 NG/ML Final   07/03/2012 1.0 0.0 - 4.0 NG/ML Final   12/30/2011 0.5 0.0 - 4.0 NG/ML Final   07/15/2011 0.3 0.0 - 4.0 NG/ML Final   04/08/2011 0.3 0.0 - 4.0 NG/ML Final

## 2019-08-24 NOTE — Unmapped (Signed)
[facility] Refill Coordination Note    Specialty Medication(s) to be Shipped:   General Specialty: Joseph Phillips 40mg     Other medication(s) to be shipped:       Joseph Phillips, DOB: 07-14-1961  Phone: [phone] (home)       All above HIPAA information was verified with patient.     Completed refill call assessment today to schedule patient's medication shipment from the [facility] [phone]).       Specialty medication(s) and dose(s) confirmed: Regimen is correct and unchanged.   Changes to medications: [name] reports no changes at this time.  Changes to insurance: No  Questions for the pharmacist: No    Confirmed patient received Welcome Packet with first shipment. The patient will receive a drug information handout for each medication shipped and additional FDA Medication Guides as required.       DISEASE/MEDICATION-SPECIFIC INFORMATION        N/A    SPECIALTY MEDICATION ADHERENCE     Medication Adherence    Patient reported X missed doses in the last month: 0  Specialty Medication: Xtandi 40mg   Patient is on additional specialty medications: No  Support network for adherence: family member                Xtandi 40 mg: 12 days of medicine on hand     SHIPPING     Shipping address confirmed in Epic.     Delivery Scheduled: Yes, Expected medication delivery date: 09/09.     Medication will be delivered via Next Day Courier to the home address in Epic WAM.    [name]   [facility] Specialty Technician

## 2019-08-31 MED FILL — XTANDI 40 MG CAPSULE: 30 days supply | Qty: 120 | Fill #2 | Status: AC

## 2019-08-31 MED FILL — XTANDI 40 MG CAPSULE: ORAL | 30 days supply | Qty: 120 | Fill #2

## 2019-09-02 ENCOUNTER — Emergency Department: Admit: 2019-09-02 | Discharge: 2019-09-02 | Disposition: A | Discharge status: Home / Self Care | Payer: MEDICAID

## 2019-09-02 ENCOUNTER — Ambulatory Visit: Admit: 2019-09-02 | Discharge: 2019-09-02 | Disposition: A | Discharge status: Home / Self Care | Payer: MEDICAID

## 2019-09-02 DIAGNOSIS — Z8546 Personal history of malignant neoplasm of prostate: Secondary | ICD-10-CM

## 2019-09-02 DIAGNOSIS — Z923 Personal history of irradiation: Secondary | ICD-10-CM

## 2019-09-02 DIAGNOSIS — Z79899 Other long term (current) drug therapy: Secondary | ICD-10-CM

## 2019-09-02 DIAGNOSIS — Z885 Allergy status to narcotic agent status: Secondary | ICD-10-CM

## 2019-09-02 DIAGNOSIS — R1110 Vomiting, unspecified: Secondary | ICD-10-CM

## 2019-09-02 DIAGNOSIS — Z880 Allergy status to penicillin: Secondary | ICD-10-CM

## 2019-09-02 DIAGNOSIS — Z85830 Personal history of malignant neoplasm of bone: Secondary | ICD-10-CM

## 2019-09-02 DIAGNOSIS — J449 Chronic obstructive pulmonary disease, unspecified: Secondary | ICD-10-CM

## 2019-09-02 DIAGNOSIS — F17210 Nicotine dependence, cigarettes, uncomplicated: Secondary | ICD-10-CM

## 2019-09-02 DIAGNOSIS — Z9221 Personal history of antineoplastic chemotherapy: Secondary | ICD-10-CM

## 2019-09-02 DIAGNOSIS — R079 Chest pain, unspecified: Secondary | ICD-10-CM

## 2019-09-02 DIAGNOSIS — K861 Other chronic pancreatitis: Secondary | ICD-10-CM

## 2019-09-02 DIAGNOSIS — R918 Other nonspecific abnormal finding of lung field: Secondary | ICD-10-CM

## 2019-09-02 DIAGNOSIS — I10 Essential (primary) hypertension: Secondary | ICD-10-CM

## 2019-09-02 DIAGNOSIS — M549 Dorsalgia, unspecified: Secondary | ICD-10-CM

## 2019-09-02 DIAGNOSIS — Z20828 Contact with and (suspected) exposure to other viral communicable diseases: Secondary | ICD-10-CM

## 2019-09-02 LAB — CBC W/ AUTO DIFF
BASOPHILS ABSOLUTE COUNT: 0 10*9/L (ref 0.0–0.1)
BASOPHILS RELATIVE PERCENT: 0.4 %
EOSINOPHILS ABSOLUTE COUNT: 0.1 10*9/L (ref 0.0–0.4)
EOSINOPHILS RELATIVE PERCENT: 0.8 %
HEMATOCRIT: 31 % — ABNORMAL LOW (ref 41.0–53.0)
HEMOGLOBIN: 10.5 g/dL — ABNORMAL LOW (ref 13.5–17.5)
LARGE UNSTAINED CELLS: 2 % (ref 0–4)
LYMPHOCYTES ABSOLUTE COUNT: 2.3 10*9/L (ref 1.5–5.0)
MEAN CORPUSCULAR HEMOGLOBIN CONC: 33.8 g/dL (ref 31.0–37.0)
MEAN CORPUSCULAR HEMOGLOBIN: 32 pg (ref 26.0–34.0)
MEAN CORPUSCULAR VOLUME: 94.5 fL (ref 80.0–100.0)
MEAN PLATELET VOLUME: 7.2 fL (ref 7.0–10.0)
MONOCYTES RELATIVE PERCENT: 5.8 %
NEUTROPHILS ABSOLUTE COUNT: 6.6 10*9/L (ref 2.0–7.5)
NEUTROPHILS RELATIVE PERCENT: 67.4 %
PLATELET COUNT: 247 10*9/L (ref 150–440)
RED BLOOD CELL COUNT: 3.28 10*12/L — ABNORMAL LOW (ref 4.50–5.90)
RED CELL DISTRIBUTION WIDTH: 13.5 % (ref 12.0–15.0)

## 2019-09-02 LAB — APTT
APTT: 32.9 s (ref 25.3–37.1)
Coagulation surface induced:Time:Pt:PPP:Qn:Coag: 32.9
HEPARIN CORRELATION: 0.2

## 2019-09-02 LAB — COMPREHENSIVE METABOLIC PANEL
ALBUMIN: 4.3 g/dL (ref 3.5–5.0)
ALKALINE PHOSPHATASE: 336 U/L — ABNORMAL HIGH (ref 38–126)
ALT (SGPT): 14 U/L (ref <50)
ANION GAP: 13 mmol/L (ref 7–15)
AST (SGOT): 49 U/L (ref 19–55)
BILIRUBIN TOTAL: 0.4 mg/dL (ref 0.0–1.2)
BLOOD UREA NITROGEN: 18 mg/dL (ref 7–21)
BUN / CREAT RATIO: 25
CALCIUM: 9.2 mg/dL (ref 8.5–10.2)
CHLORIDE: 111 mmol/L — ABNORMAL HIGH (ref 98–107)
CO2: 16 mmol/L — ABNORMAL LOW (ref 22.0–30.0)
CREATININE: 0.72 mg/dL (ref 0.70–1.30)
EGFR CKD-EPI AA MALE: >90 mL/min/{1.73_m2} (ref >=60)
POTASSIUM: 4.8 mmol/L (ref 3.5–5.0)
PROTEIN TOTAL: 7.4 g/dL (ref 6.5–8.3)
SODIUM: 140 mmol/L (ref 135–145)

## 2019-09-02 LAB — D-DIMER QUANTITATIVE (CH,ML,PD,ET): Lab: 315 — ABNORMAL HIGH

## 2019-09-02 LAB — TROPONIN I
Troponin I.cardiac:MCnc:Pt:Ser/Plas:Qn:: <0.06
Troponin I.cardiac:MCnc:Pt:Ser/Plas:Qn:: <0.06

## 2019-09-02 LAB — LIPASE: Triacylglycerol lipase:CCnc:Pt:Ser/Plas:Qn:: 81

## 2019-09-02 LAB — BILIRUBIN TOTAL: Bilirubin:MCnc:Pt:Ser/Plas:Qn:: 0.4

## 2019-09-02 LAB — PROTIME-INR: PROTIME: 11.9 s (ref 10.2–13.1)

## 2019-09-02 LAB — PLATELET COUNT: Platelets:NCnc:Pt:Bld:Qn:Automated count: 247

## 2019-09-02 LAB — PROTIME: Lab: 11.9

## 2019-09-02 NOTE — Unmapped (Signed)
Pt to ED for CP since 10 am.  12 lead WNL.  Has had 3x nitro and 324 mg ASA.  18 awg L AC.  VS WNL

## 2019-09-02 NOTE — Unmapped (Signed)
[facility]  Emergency Department Provider Note    Initial Impression, ED Course, Assessment and Plan     Impression: Joseph Phillips is a 58 y.o. male with a PMH of COPD, HTN, polysubstance abuse (including EtOH), chronic pancreatitis, prostate cancer with mets to bones s/p chemo and radiation, GERD, and seizures who was BIB EMS for onset today at 1000 of waxing/waning chest and back pain with one episode of non-bilious, non-bloody emesis.    On exam, the patient is uncomfortable appearing but in NAD. His vitals are WNL and he is afebrile. HRRR without murmurs, lungs CTAB, and normal work of breathing. He has epigastric TTP with guarding (within normal limits for him due to chronic pancreatitis). No rebound. Abdomen otherwise soft and nondistended. No BLE edema.     BP 127/78  - Pulse 78  - Temp 37.1 ??C (98.7 ??F) (Oral)  - Resp 25  - SpO2 100%     Assessment/Plan: Differential diagnosis includes ACS, pneumonia, pneumothorax, acute on chronic pancreatitis, GERD.  Considering PE given history of prostate carcinoma with bone mets.    EKG, troponin, CBC, CMP, chest x-ray, d-dimer ordered for further evaluation    ED Course:   ED Course as of Sep 03 1041   Thu Sep 02, 2019   1325 Sinus rhythm with regular rate.  Normal PR normal QRS normal QT intervals no axis deviation.  No ST segment elevation or depression no T wave inversion.  Normal EKG       ECG 12 Lead   1352 XR Chest Portable   1353 IMPRESSION:  ??  Lungs clear      1614 CT PE, aortic dissection protocol ordered   D-Dimer(!): 315   1629 Troponin I: <0.060   1657 Attending radiologist called me to let me know that patient had contrast extravasation during CT scans.  No immediate complications.      1727 Full aortic dissection study was unable to be performed however thoracic aorta was visualized down to renal artery.  If radiologist is able to definitively said there is no thoracic aortic dissection then a repeat aortic dissection protocol CT will be unnecessary.      55 Spoke with resident radiologist.  He does not believe that there is a aortic dissection in the thoracic aorta.      1846 Second troponin is negative. Plan for discharge. He reports understanding. Strict return precautions discussed, patient is agreeable to plan.               MDM/Disposition: Heart score calculated at 3.  Plan to repeat troponin.    Remainder of workup pending. Patient care signed out to oncoming physician, Dr. Lurline Del    _____________________________________________________    The case was discussed with the attending physician who is in agreement with the above assessment and plan.    ED Clinical Impression     Final diagnoses:   Chest pain, unspecified type (Primary)       Additional Medical Decision Making     Time seen: September 02, 2019 1:19 PM    I have reviewed the vital signs and the nursing notes. Labs and radiology results that were available during my care of the patient were independently reviewed by me and considered in my medical decision making.     I independently visualized the EKG tracing if performed  I independently visualized the radiology images if performed  I reviewed the patient's prior medical records if available.  Additional history  obtained from family if available    Please note- This chart has been created using [employer]. Chart creation errors have been sought, but may not always be located and such creation errors, especially pronoun confusion, do NOT reflect on the standard of medical care.      History     Chief Complaint  Chest Pain      HPI   Joseph Phillips is a 58 y.o. male with a PMH of COPD, HTN, polysubstance abuse (including EtOH), chronic pancreatitis, prostate cancer with mets to bones s/p chemo and radiation, GERD, and seizures who was BIB EMS for chest pain. Per EMS report, the patient received NTG x3 and ASA 324 mg PTA.    The patient states that today at 1000, he was lying down at home when he developed a gradual onset of waxing/waning chest and back pain that is aggravated by exertion. He had one episode of non-bilious, non-bloody emesis PTA. He denies a hx of similar chest pain and his symptoms are not similar to his chronic pancreatitis. He also endorses about 1 week of a cold. Denies known covid contacts. Denies shortness of breath. He has an appointment with oncology on 9/15 for IM chemo. Last EtOH was in July or August. No hx of DVT or PE. No recent immobilizations. He smokes tobacco. No personal or familial hx of MI.      ROS - 10 point review of systems was performed and is negative unless otherwise noted in HPI     CONST: Does not endorse fever/chills.  NEURO: Does not endorse headache.  EYES: Does not endorse visual changes.  NECK: Does not endorse neck stiffness  ENT: Does not endorse sore throat.  CV: Does not endorse chest pain.  RESP: Does not endorse cough or shortness of breath.  GI: Does not endorse nausea, vomiting, or abdominal pain.  HEME: Does not endorse rectal bleeding  GU: Does not endorse dysuria.      PAST MEDICAL HISTORY/PAST SURGICAL HISTORY:   Past Medical History:   Diagnosis Date   ??? Alcohol abuse    ??? COPD (chronic obstructive pulmonary disease) (CMS-HCC)    ??? Hx of radiation therapy    ??? Hypertension    ??? Polysubstance abuse (CMS-HCC)    ??? Prostate cancer (CMS-HCC)    ??? Seizures (CMS-HCC)        Patient Active Problem List   Diagnosis   ??? 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 Acute pancreatitis   ??? Depressive disorder   ??? Reflux esophagitis   ??? Encounter for long-term (current) use of other medications   ??? Pain medication agreement broken   ??? Do not give narcotics   ??? PPD screening test   ??? Tobacco use disorder   ??? Prostate cancer (CMS-HCC)   ??? Hx of radiation therapy   ??? Healthcare maintenance   ??? Sleep difficulties   ??? Essential hypertension   ??? Anxiety   ??? Hypokalemia   ??? Hypomagnesemia   ??? Herpes zoster involving sacral dermatome   ??? Stomatitis and mucositis   ??? Odynophagia   ??? Mucositis due to chemotherapy   ??? Chest pain, mid sternal   ??? Acute midline thoracic back pain   ??? Hyperkalemia   ??? COPD (chronic obstructive pulmonary disease) (CMS-HCC)   ??? Chronic pancreatitis (CMS-HCC)   ??? 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 Cough   ??? Fever and chills   ??? Acute alcoholic  gastritis without hemorrhage   ??? Alcohol withdrawal syndrome without complication (CMS-HCC)       Past Surgical History:   Procedure Laterality Date   ??? IR INSERT PORT AGE GREATER THAN 5 YRS  07/15/2018    IR INSERT PORT AGE GREATER THAN 5 YRS 07/15/2018 Carolin Coy, MD IMG [facility]   ??? PR COLSC FLX W/RMVL OF TUMOR POLYP LESION SNARE TQ N/A 08/16/2015    Procedure: COLONOSCOPY FLEX; W/REMOV TUMOR/LES BY SNARE;  Surgeon: Gwen Pounds, MD;  Location: [facility] PROC[facility];  Service: Gastroenterology   ??? PR COLSC FLX WITH DIRECTED SUBMUCOSAL NJX ANY SBST N/A 08/16/2015    Procedure: COLONOSCOPY, FLEXIBLE, PROXIMAL TO SPLENIC FLEXURE; WITH DIRECTED SUBMUCOSAL INJECTION(S), ANY SUBSTANCE;  Surgeon: Gwen Pounds, MD;  Location: [facility];  Service: Gastroenterology   ??? PROSTATE SURGERY         MEDICATIONS:     Current Facility-Administered Medications:   ???  nitroglycerin (NITROSTAT) SL tablet 0.4 mg, 0.4 mg, Sublingual, Q5 Min PRN, Judie Petit, MD, 0.4 mg at 09/02/19 1556    Current Outpatient Medications:   ???  albuterol HFA 90 mcg/actuation inhaler, INHALE 2 PUFFS EVERY 6 HOURS AS NEEDED FOR WHEEZING, Disp: 8.5 g, Rfl: 0  ???  amLODIPine (NORVASC) 10 MG tablet, Take 1 tablet (10 mg total) by mouth daily., Disp: 30 tablet, Rfl: 11  ???  busPIRone (BUSPAR) 5 MG tablet, TAKE 1 TABLET BY MOUTH THREE TIMES A DAY, Disp: 270 tablet, Rfl: 3  ???  calcium carbonate 650 mg calcium (1,625 mg) tablet, Take 1 tablet (650 mg of elem calcium total) by mouth daily., Disp: 30 tablet, Rfl: 11  ???  chlordiazePOXIDE (LIBRIUM) 25 MG capsule, 1 tab this afternoon, 1 this evening, every 8 hours tomorrow and stop, Disp: 5 capsule, Rfl: 0  ???  diclofenac sodium (VOLTAREN) 1 % gel, Apply 2 g topically Four (4) times a day. Any area of pain, Disp: 200 g, Rfl: 5  ???  diphenhydrAMINE (BENADRYL) 25 mg tablet, Take 0.5 tablets (12.5 mg total) by mouth every six (6) hours as needed for itching., Disp: 30 capsule, Rfl: 0  ???  diphenhydramine HCl (MAGIC MOUTHWASH ORAL) suspension, Take 10 mL by mouth 4 (four) times a day as needed., Disp: 120 mL, Rfl: 1  ???  enzalutamide (XTANDI) 40 mg capsule, Take 4 capsules (160 mg total) by mouth daily., Disp: 120 capsule, Rfl: 5  ???  famotidine (PEPCID) 20 MG tablet, Take 1 tablet (20 mg total) by mouth two (2) times a day as needed for heartburn., Disp: 60 tablet, Rfl: 0  ???  fentaNYL (DURAGESIC) 25 mcg/hr patch, Place 1 patch on the skin every third day., Disp: 10 patch, Rfl: 0  ???  megestroL (MEGACE) 400 mg/10 mL (40 mg/mL) suspension, Take 10 mL (400 mg total) by mouth Two (2) times a day., Disp: 480 mL, Rfl: 12  ???  melatonin 3 mg Tab, TK 1 T PO HS PRN, Disp: , Rfl:   ???  MULTIVITAMIN 50 PLUS Tab, TK 1 T PO D, Disp: , Rfl:   ???  oxyCODONE (ROXICODONE) 5 MG immediate release tablet, Take 2 tablets (10 mg total) by mouth every four (4) hours as needed for pain., Disp: 100 tablet, Rfl: 0  ???  pancrelipase, Lip-Prot-Amyl, (CREON) 24,000-76,000 -120,000 unit CpDR delayed release capsule, Take 48,000 units of lipase by mouth Three (3) times a day with a meal. Take an additional 1 capsule with  snacks., Disp: , Rfl:   ???  prochlorperazine (COMPAZINE) 10 MG tablet, TAKE 1 TABLET(10 MG) BY MOUTH EVERY 8 HOURS AS NEEDED, Disp: 30 tablet, Rfl: 1  ???  salmeteroL (SEREVENT) 50 mcg/dose diskus inhaler, Inhale 1 puff Two (2) times a day., Disp: 1 Inhaler, Rfl: 12  ???  tiotropium (SPIRIVA WITH HANDIHALER) 18 mcg inhalation capsule, Place 1 capsule (18 mcg total) into inhaler and inhale once daily., Disp: 90 capsule, Rfl: 3  ???  traZODone (DESYREL) 50 MG tablet, Take 1 tablet (50 mg total) by mouth nightly as needed for sleep., Disp: 90 tablet, Rfl: 3  ???  valACYclovir (VALTREX) 500 MG tablet, Take 1 tablet (500 mg total) by mouth daily., Disp: 90 tablet, Rfl: 3    ALLERGIES:   Penicillins and Tramadol    SOCIAL HISTORY:   Social History     Tobacco Use   ??? Smoking status: Current Every Day Smoker     Packs/day: 0.50     Years: 40.00     Pack years: 20.00     Types: Cigarettes     Start date: 05/09/1994   ??? Smokeless tobacco: Never Used   ??? Tobacco comment: Pt smokes 10cpd, pt expressed desire to quit    Substance Use Topics   ??? Alcohol use: Not Currently     Alcohol/week: 36.0 standard drinks     Types: 36 Cans of beer per week     Comment: states 3 or 4 big beers a day every day       FAMILY HISTORY:  Family History   Problem Relation Age of Onset   ??? Heart disease Mother    ??? Diabetes Mother    ??? Diabetes Father    ??? Melanoma Neg Hx    ??? Basal cell carcinoma Neg Hx    ??? Squamous cell carcinoma Neg Hx           Physical Exam     ED Triage Vitals   Enc Vitals Group      BP 09/02/19 1311 134/76      Heart Rate 09/02/19 1311 94      SpO2 Pulse 09/02/19 1311 81      Resp 09/02/19 1311 16      Temp 09/02/19 1313 37.1 ??C (98.7 ??F)      Temp Source 09/02/19 1313 Oral      SpO2 09/02/19 1311 100 %     EXAM     CONST: Appears stated age, answers questions appropriately. Uncomfortable appearing.  HEENT: Normocephalic and atraumatic. Conjunctivae clear. No congestion, epistaxis. Moist mucous membranes.   CV: RRR, normal S1/S2, no murmurs. Symmetric pulses. No peripheral edema  RESP: Normal respiratory effort, clear to auscultation bilaterally. No wheezes or crackles.  ABD: Epigastric TTP with guarding. No rebound. Abdomen otherwise soft and nondistended.  MSK: Normal range of motion in all extremities  SKIN: Warm, dry and intact without rashes.  HEME: No petechiae or bruising  PSYCH: Mood and affect are normal. Speech and behavior are normal.  NEURO: Normal speech and language. No gross focal neurologic deficits appreciated.      Radiology     CTA Chest W Contrast   Preliminary Result      - No pulmonary embolism.      - Left greater than right bilateral lower lobe groundglass opacities, which may represent infection and/or aspiration.      - Multifocal osseous metastatic disease appears more extensive compared to 03/10/2019.  XR Chest Portable   Final Result      Lungs clear.        Xr Chest Portable    Result Date: 09/02/2019  EXAM: XR CHEST PORTABLE DATE: 09/02/2019 1:49 PM ACCESSION: [id number] UN DICTATED: 09/02/2019 1:49 PM INTERPRETATION LOCATION: [facility] CLINICAL INDICATION: 58 years old Male with CHEST PAIN  COMPARISON: 06/22/2019. TECHNIQUE: Portable Chest Radiograph. FINDINGS: Right-sided Port-A-Cath terminates in the SVC/right atrial junction. Lungs radiographically clear. No pleural effusion or pneumothorax Stable cardiomediastinal silhouette.     Lungs clear.    Cta Chest W Contrast    Result Date: 09/02/2019  EXAM: CTA CHEST W CONTRAST DATE: 09/02/2019 5:04 PM ACCESSION: [id number] UN DICTATED: 09/02/2019 5:16 PM INTERPRETATION LOCATION: [facility] CLINICAL INDICATION: 58 years old Male with + D - dimer  COMPARISON: CTA aortic dissection dated 03/10/2019 TECHNIQUE: A helical CTA scan was obtained with IV contrast from the lung apices to the lung bases. Images were reconstructed in the axial plane. Multiplanar reformatted and MIP images were provided. FINDINGS: Right IJ single-lumen port with tip in the superior cavoatrial junction. PULMONARY ARTERIES: No pulmonary embolism. AIRWAYS, LUNGS, PLEURA: Clear central tracheobronchial tree.  Patchy groundglass opacities within the bilateral lower lobes. Bilateral peribronchial thickening. No pleural effusion. MEDIASTINUM: Cardiomegaly. Coronary calcifications. No pericardial effusion.  Normal caliber thoracic aorta.  Scattered subcentimeter mediastinal lymph nodes. IMAGED ABDOMEN: Renal cysts, partially imaged. Calcified granuloma in the right hepatic lobe. Cholelithiasis. SOFT TISSUES: Unremarkable. BONES: Multifocal sclerotic osseous metastatic disease, more extensive when compared to 03/10/2019.     - No pulmonary embolism. - Left greater than right bilateral lower lobe groundglass opacities, which may represent infection and/or aspiration. - Multifocal osseous metastatic disease appears more extensive compared to 03/10/2019.       LABORATORY DATA:     Results for orders placed or performed during the hospital encounter of 09/02/19   Comprehensive Metabolic Panel   Result Value Ref Range    Sodium 140 135 - 145 mmol/L    Potassium 4.8 3.5 - 5.0 mmol/L    Chloride 111 (H) 98 - 107 mmol/L    Anion Gap 13 7 - 15 mmol/L    CO2 16.0 (L) 22.0 - 30.0 mmol/L    BUN 18 7 - 21 mg/dL    Creatinine 3.08 6.57 - 1.30 mg/dL    BUN/Creatinine Ratio 25     EGFR CKD-EPI Non-African American, Male >90 >=60 mL/min/1.73m2    EGFR CKD-EPI African American, Male >90 >=60 mL/min/1.7m2    Glucose 94 70 - 179 mg/dL    Calcium 9.2 8.5 - 84.6 mg/dL    Albumin 4.3 3.5 - 5.0 g/dL    Total Protein 7.4 6.5 - 8.3 g/dL    Total Bilirubin 0.4 0.0 - 1.2 mg/dL    AST 49 19 - 55 U/L    ALT 14 <50 U/L    Alkaline Phosphatase 336 (H) 38 - 126 U/L   Troponin I   Result Value Ref Range    Troponin I <0.060 <0.060 ng/mL   PT-INR   Result Value Ref Range    PT 11.9 10.2 - 13.1 sec    INR 1.03    aPTT   Result Value Ref Range    APTT 32.9 25.3 - 37.1 sec    Heparin Correlation 0.2    D-Dimer, Quantitative   Result Value Ref Range    D-Dimer 315 (H) <230 ng/mL DDU   Lipase   Result Value Ref Range    Lipase 81 44 -  232 U/L   ECG 12 Lead   Result Value Ref Range    EKG Systolic BP  mmHg    EKG Diastolic BP  mmHg    EKG Ventricular Rate 80 BPM    EKG Atrial Rate 80 BPM    EKG P-R Interval 126 ms    EKG QRS Duration 74 ms    EKG Q-T Interval 408 ms    EKG QTC Calculation 470 ms    EKG Calculated P Axis 29 degrees    EKG Calculated R Axis 23 degrees    EKG Calculated T Axis 41 degrees    QTC Fredericia 449 ms   CBC w/ Differential   Result Value Ref Range    WBC 9.8 4.5 - 11.0 10*9/L    RBC 3.28 (L) 4.50 - 5.90 10*12/L    HGB 10.5 (L) 13.5 - 17.5 g/dL    HCT 57.8 (L) 46.9 - 53.0 %    MCV 94.5 80.0 - 100.0 fL    MCH 32.0 26.0 - 34.0 pg    MCHC 33.8 31.0 - 37.0 g/dL    RDW 62.9 52.8 - 41.3 %    MPV 7.2 7.0 - 10.0 fL    Platelet 247 150 - 440 10*9/L    Neutrophils % 67.4 %    Lymphocytes % 23.3 %    Monocytes % 5.8 %    Eosinophils % 0.8 %    Basophils % 0.4 %    Absolute Neutrophils 6.6 2.0 - 7.5 10*9/L    Absolute Lymphocytes 2.3 1.5 - 5.0 10*9/L    Absolute Monocytes 0.6 0.2 - 0.8 10*9/L    Absolute Eosinophils 0.1 0.0 - 0.4 10*9/L    Absolute Basophils 0.0 0.0 - 0.1 10*9/L    Large Unstained Cells 2 0 - 4 %         Documentation assistance was provided by Merwyn Katos, Scribe on September 02, 2019 at 1:20 PM for Jacquelynn Cree, MD.    Documentation assistance was provided by the scribe in my presence.  The documentation recorded by the scribe has been reviewed by me and accurately reflects the services I personally performed.        Judie Petit, MD  Resident  09/04/19 [phone]

## 2019-09-03 NOTE — Unmapped (Signed)
[facility]  Emergency Department Progress Note    September 02, 2019 5:45 PM         Recent VS:  Recent Patient Data       09/02/2019  1500 09/02/2019  1600 09/02/2019  1601 09/02/2019  1615 09/02/2019  1618    BP:  154/97  116/80  102/65  127/69  127/78    Pulse:  82  ???  83  ???  78    Resp:  17  ???  ???  ???  25    SpO2:  100 %  ???  ???  ???  100 %        Joseph Phillips is a 58 y.o. male with PMH of COPD, HTN, polysubstance abuse (including EtOH), chronic pancreatitis, prostate cancer with mets to bones s/p chemo and radiation, GERD, and seizures who presented to the ED via EMS for gradual onset of waxing and waning chest as well as back pain that was worsened with exertion. The patient received 3 doses of NTG and 325 mg ASA PTA. D-dimer elevated to 315, and CBC notable for HGB depressed to 10.5. Labs were otherwise unremarkable.     CXR clear, CTA indicates no PE but there were opacities present, possibly indicating infection or aspiration. He has no evidence of infection here, but will send outpatient COVID test and hold off on treatment as he has no clinical signs of PNA.  Aorta that is visualized is unremarkable.  The patient's metastatic disease also appeared more extensive compared to the CTA from 03/10/19. EKG was normal. HEART score 3, with two negative troponins.    I spoke with the patient to reassess him and he actually felt much better.  Given that after extensive workup there were no findings, unclear what happened.  Perhaps nonspecific pain from increasing met burden.  Given that the workup today has been negative and patient is at his baseline, plan to discharge to outpatient follow up.    The above clinical impression, medical decision making, and treatment plan were discussed with the patient, who expressed understanding.  We have discussed anticipatory guidance, proposed follow-up, and careful return precautions; he expressed understanding and is comfortable with the discharge plan.  I provided an opportunity and answered any questions.      ??   Additional Medical Decision Making   ??  I have reviewed the patient's vital signs and the nursing notes. Any pertinent labs & imaging results which were available during my care of the patient were reviewed by me.     ??   ED Clinical Impression       Final diagnoses:   Chest pain, unspecified type (Primary)     Documentation assistance was provided by Beacher May, Scribe, on September 02, 2019 at 5:45 PM for Herbie Drape, MD.     Documentation assistance was provided by the scribe in my presence.  The documentation recorded by the scribe has been reviewed by me and accurately reflects the services I personally performed.

## 2019-09-03 NOTE — Unmapped (Signed)
Negative COVID19 result received.  Patient notified via Attempted to call, mailed letter with follow up instructions and work note.

## 2019-09-03 NOTE — Unmapped (Signed)
Initial Visit Note    Patient Name: Joseph Phillips  Patient Age: 58 y.o.  Encounter Date: 09/07/2019    Referring Physician:     Towanda Malkin, MD  [address]  [address] [facility] [id number]  [place],  [place] [address]    Primary Care Provider:  Unknown Foley, MD    Assessment/Plan:    Reason for visit:  Prostate Cancer    Cancer Staging  Prostate cancer (CMS-HCC)  Staging form: Prostate, AJCC 7th Edition  - Clinical: Stage IV (T3a, N0, M1b, PSA: Less than 10, Gleason 7) - Unsigned      --Prostate cancer, stage IV,      -- with progression on Lupron/Zytiga     --07/14/18: Docetaxel/prednisone     --11/11/2018: Neutropenic fever, with possible UTI and oral mucositis     -- 02/03/19: [name];  Held 03/01/19 for LE swelling/cellulitis; restart 03/29/19 at 80 mg qD     -- 08/16/19:  [name] increased to 160 mg daily  --Hypokalemia  --Hypomagnesemia  -- Alcoholism      -- 06/22/19: Admitted for alcoholic gastritis  --Leg cramps and pain, exacerbated by electrolyte disturbances      --03/01/19: Skin changes in the lower extremities of uncertain etiology.  --Teeth in poor repair, awaiting dental correction  --Hypertension  --Lower extremity swelling, exacerbated at night; improved on triamterene-HCTZ  -- OSA; positive study for both central and obstructive apnea; CPAP trial recommended, 07/23/2018; deferred by the patient  --History of pancreatitis with pancreatic insufficiency; on Creon; 2/2 EtOHism      Recommendation  --Patient will continue on Xtandi 160 mg daily.  There is concern for increasing bone metastasis burden as evidenced by the most recent CTA of the chest; however, patient's compliance with medication may have been poor during the period of time that he was having problems with alcoholic binge drinking.  PSA is pending.  --Patient will be restarted on oxycodone 5-10 mg every 6 hours as needed, and fentanyl 25 mcg topically every 3 days  --Referral will be made to the pain clinic for advice on management  --The patient understands that if he drinks again, or uses other nonprescribed medications, we will stop writing all narcotic prescriptions.  --Megace was refilled and the patient advised to restart it 400 mg twice daily  --Return in 1 month for follow-up; may need to continue monthly Lupron injections until 38-month injection is available.    Patient instructions  --Continue on Xtandi 160 mg (4 tablets) daily  --Increase trazodone to 100 mg at bedtime.  You may also take Benadryl 25-50 mg at bedtime to help fall asleep.  --You might consider a trial of melatonin 5 mg at bedtime for sleep.  --Take milk of magnesia 2 tablespoons at bedtime, and have Senokot on hand to use as needed daily or even twice daily, to ensure at least one good bowel movement today.  --Try doubling the fentanyl patch so that you are getting 50 mcg/hour, with replacement of the 2 patches every 3 days.  If this is helping, let me know and we will rewrite the prescription for the higher dose.  --We will try to get you in to the pain clinic, to see if they can help in managing your pain.  --Remember to take Megace 2 teaspoons twice daily with meals to boost her appetite.  --We will have you return in 1 month for follow-up.          I have reviewed the laboratory, pathology, and  radiology reports in detail and discussed findings with the patient    History of Present Illness:     Joseph Phillips is a 58 y.o. male who is seen in consultation at the request of Lonn Georgia* for an evaluation of Prostate Cancer.  The patient has stayed off alcohol.  He is taking Xtandi 160 mg each morning for the past month.  He has been having chest pains and was seen in the emergency room for chest pain.  In the emergency room, evaluation did not indicate clear evidence for cardiac source.  A CTA showed no pulmonary embolism but did show lower lung field groundglass opacities thought to most likely be secondary to aspiration.  There is increased sclerosis of diffuse osseous metastatic sites, compared to 03/10/2019.  He has been taking fentanyl 1 patch every 72 hours and oxycodone 2 tablets every 6 hours.  He also takes trazodone at bedtime.  He was on Librium as well, but is now off of this and it is affecting his sleep.  He has been taking Librium in the past.  His pain is currently mostly in his lower part of his back.      Oncology History:  Patient was originally diagnosed with prostate cancer in 2006.  He underwent robot-assisted prostatectomy.  He did well until May 2018, when his PSA rose to a high of about 45, and he was started on Lupron, initially with Casodex, and then with apparatus.  His PSA fell and remained low until recently when he was noted to be rising from a low of 0.82 on  01/01/18, to 3.99 on 06/29/2018.  He was instructed to hold Zytiga.  Since holding Zytiga he is noted swelling in his ankles.  The swelling comes on overnight, improves during the course the day.  He was evaluated with Doppler studies on 06/29/2018 which showed no evidence of blood clots.  He is still bothered by the swelling.  Restaging studies done on 06/04/2018 including bone scan, and CT of the chest abdomen pelvis, showed sclerotic changes throughout the axial skeleton, and involvement also of scapula, some ribs and pelvis.  The sites were new since 2016, and also probably new since the CT stone study in May 2018.  Bone scan also showed some progression in disease by report, compared to 2018.  He has developed pain in the back of his calves, for which he takes oxycodone (Tylenol and ibuprofen were ineffective).  The pain followed prolonged episodes of cramping, felt to be related to low magnesium and potassium.  About 6 months ago he had shingles in a left L2 distribution.        Oncology History   Prostate cancer (CMS-HCC)   12/02/2008 Biopsy    Prostate, left apex, needle biopsy  - Adenocarcinoma, Gleason combined score 6 (3+3), 2 of 2 cores involved, tumor  approximately 2 mm in greatest diameter, approximately 10% of tissue involved by  carcinoma.  - See comment    E: ??Prostate, left mid, needle biopsy  - Adenocarcinoma, Gleason combined score 6 (3+3), 1 of 2 cores involved, tumor  approximately 2 mm in greatest diameter, approximately 5% of tissue involved by  carcinoma.  - See comment    F: ??Prostate, left base, needle biopsy  - Adenocarcinoma, Gleason combined score 6 (3+3), 1 of 2 cores involved, tumor  approximately 1 mm in greatest diameter, approximately 5% of tissue involved by  carcinoma.     01/24/2009 Surgery    Prostate, robot assisted  laparoscopic prostatectomy  Tumor histologic type: Prostatic adenocarcinoma  Tumor grade (Gleason system): Gleason pattern 7 (3+4)  Approximate percentage of prostate involved by tumor: 40%  Extent of tumor:  Location of tumor involvement in prostate: right and left lobes, anterior  and posterior, apex to base  Confinement/non-confinement of tumor within the prostatic capsule: ??not  confined  Location of extracapsular tumor involvement: ??left prostate toward the base  (ie. block A22)  Type of extracapsular tissue involved within tumor: ?? adipose  tissue/perineural tissue  Angiolymphatic space invasion: not identified  Seminal vesicles: negative  Vas deferens: negative  Histologic assessment of surgical margins: positive margin along a 3 mm  face in the left prostate base (block A25)    Other significant findings: High grade prostatic intraepithelial  Neoplasm;  Lymph nodes: Not applicable  AJCC Staging: pT3a pNx     08/30/2013 Initial Diagnosis    Prostate cancer (CMS-HCC)     04/30/2017 -  Chemotherapy    Lupron 22.5 mg; with bicalutamide x  month     08/01/2017 - 06/30/2018 Chemotherapy    Abiaterone 1000 mg daily; decrease to 750 mg daily at 10/2018;     10/30/2017 -  Cancer Staged    Starta: TP53, TMPSSR  -EGR fusion; no other abnormalities identified     01/14/2018 -  Cancer Staged    CT Chest:  Numerous sclerotic metastases of the thoracic spine, left scapula and multiple ribs.  3. Coronary artery and aortic atherosclerosis (ICD10-I70.0).  4. Previously demonstrated left renal mass is not included in the  field of view.     06/04/2018 -  Cancer Staged    CT CAP:  Scattered diffuse sclerotic metastatic lesions are seen throughout the spine as well as the left scapula; some of the lesions are new since prior CT dated 04/20/2015.  - No intrathoracic metastatic disease.  Interval progression of osseous metastases throughout the spine and pelvis compared to 04/22/2017.  -- Cholelithiasis.  -- Hepatic steatosis.     06/04/2018 -  Cancer Staged    Bone scan:  Increased radiotracer uptake throughout the spine, sacrum, and left ilium, consistent with numerous metastases better evaluated on same-day CT. Unchanged metastasis to the left proximal humerus.     07/14/2018 - 11/12/2018 Chemotherapy    OP PROSTATE DOCETAXEL/PREDNISONE  Docetaxel 75 mg/m2 evry 21 days     01/07/2019 -  Cancer Staged    CTA Chest:  No evidence of pulmonary embolism.  2. Occlusion of the medial basilar segmental bronchus of the left  lower lobe, nonspecific though could be seen in the setting of  aspiration. Otherwise, no acute findings.  3. Apical predominant paraseptal emphysematous change with suspected  mild progression of smoking related fibrosis with subpleural  reticulation but without evidence of honeycombing. Emphysema  (ICD10-J43.9).  4. Progression of osseous metastatic disease since 11/2017. Detailed  above. No definitive pathologic compression fractures.  5. Hepatic steatosis. 6. Coronary artery calcifications. Aortic Atherosclerosis     01/20/2019 -  Cancer Staged    NM Bone scan: Redemonstrated multiple abnormal foci throughout the appendicular axial skeleton, with interval increase in the extent of the T10 vertebral body lesion and new lesions involving the manubrium, left fifth and seventh ribs. This is most consistent with progression of osseous metastatic disease.     02/03/2019 -  Chemotherapy Xtandi 160 mg qD; held after 1 month due to swelling.  Restart at 80 mg qD on 03/29/19     03/10/2019 -  Cancer Staged  CTA chest:  No evidence of acute aortic intramural hematoma or dissection.  Multifocal osseous sclerotic metastatic disease, progressed when compared to prior CT 06/04/2018 and grossly similar to most recent nuclear medicine bone scan.  Mucoid impacted airways in left lower lobe with adjacent subtle ground glass opacities; likely represents sequelae of aspiration. Recommend follow up CT in 6 to 8 weeks to ensure clearing.  Other chronic/incidental findings as above, not substantially changed.     09/02/2019 -  Cancer Staged    CTA Chest:  Interval increased sclerosis of diffuse osseous metastatic disease compared to 03/10/2019, likely reflects treatment related changes         09/07/2019 Endocrine/Hormone Therapy    OP LEUPROLIDE (7.5 MG EVERY MONTH)  Plan Provider: Towanda Malkin, MD         Past Medical History:   Diagnosis Date   ??? Alcohol abuse    ??? COPD (chronic obstructive pulmonary disease) (CMS-HCC)    ??? Hx of radiation therapy    ??? Hypertension    ??? Polysubstance abuse (CMS-HCC)    ??? Prostate cancer (CMS-HCC)    ??? Seizures (CMS-HCC)       Past Surgical History:   Procedure Laterality Date   ??? IR INSERT PORT AGE GREATER THAN 5 YRS  07/15/2018    IR INSERT PORT AGE GREATER THAN 5 YRS 07/15/2018 Carolin Coy, MD [facility]   ??? PR COLSC FLX W/RMVL OF TUMOR POLYP LESION SNARE TQ N/A 08/16/2015    Procedure: COLONOSCOPY FLEX; W/REMOV TUMOR/LES BY SNARE;  Surgeon: Gwen Pounds, MD;  [facility];  Service: Gastroenterology   ??? PR COLSC FLX WITH DIRECTED SUBMUCOSAL NJX ANY SBST N/A 08/16/2015    Procedure: COLONOSCOPY, FLEXIBLE, PROXIMAL TO SPLENIC FLEXURE; WITH DIRECTED SUBMUCOSAL INJECTION(S), ANY SUBSTANCE;  Surgeon: Gwen Pounds, MD;  Location[facility];  Service: Gastroenterology   ??? PROSTATE SURGERY          Family History   Problem Relation Age of Onset   ??? Heart disease Mother    ??? Diabetes Mother    ??? Diabetes Father    ??? Melanoma Neg Hx    ??? Basal cell carcinoma Neg Hx    ??? Squamous cell carcinoma Neg Hx       Family Status   Relation Name Status   ??? Mother  (Not Specified)   ??? Father  (Not Specified)   ??? Neg Hx  (Not Specified)     Social History     Occupational History   ??? Occupation: DISABLED   Tobacco Use   ??? Smoking status: Current Every Day Smoker     Packs/day: 0.50     Years: 40.00     Pack years: 20.00     Types: Cigarettes     Start date: 05/09/1994   ??? Smokeless tobacco: Never Used   ??? Tobacco comment: Pt smokes 10cpd, pt expressed desire to quit    Substance and Sexual Activity   ??? Alcohol use: Not Currently     Alcohol/week: 36.0 standard drinks     Types: 36 Cans of beer per week     Comment: states 3 or 4 big beers a day every day   ??? Drug use: Not Currently     Types: Marijuana   ??? Sexual activity: Not Currently       Allergies   Allergen Reactions   ??? Penicillins      Other reaction(s):  UNKNOWN   ??? Tramadol Dizziness       Current Outpatient Medications   Medication Sig Dispense Refill   ??? albuterol HFA 90 mcg/actuation inhaler INHALE 2 PUFFS EVERY 6 HOURS AS NEEDED FOR WHEEZING 8.5 g 0   ??? amLODIPine (NORVASC) 10 MG tablet Take 1 tablet (10 mg total) by mouth daily. 30 tablet 11   ??? busPIRone (BUSPAR) 5 MG tablet TAKE 1 TABLET BY MOUTH THREE TIMES A DAY 270 tablet 3   ??? calcium carbonate 650 mg calcium (1,625 mg) tablet Take 1 tablet (650 mg of elem calcium total) by mouth daily. 30 tablet 11   ??? chlordiazePOXIDE (LIBRIUM) 25 MG capsule 1 tab this afternoon, 1 this evening, every 8 hours tomorrow and stop 5 capsule 0   ??? diclofenac sodium (VOLTAREN) 1 % gel Apply 2 g topically Four (4) times a day. Any area of pain 200 g 5   ??? diphenhydrAMINE (BENADRYL) 25 mg tablet Take 0.5 tablets (12.5 mg total) by mouth every six (6) hours as needed for itching. 30 capsule 0   ??? diphenhydramine HCl (MAGIC MOUTHWASH ORAL) suspension Take 10 mL by mouth 4 (four) times a day as needed. 120 mL 1   ??? enzalutamide (XTANDI) 40 mg capsule Take 4 capsules (160 mg total) by mouth daily. 120 capsule 5   ??? famotidine (PEPCID) 20 MG tablet Take 1 tablet (20 mg total) by mouth two (2) times a day as needed for heartburn. 60 tablet 0   ??? fentaNYL (DURAGESIC) 25 mcg/hr patch Place 1 patch on the skin every third day. 10 patch 0   ??? megestroL (MEGACE) 400 mg/10 mL (40 mg/mL) suspension Take 10 mL (400 mg total) by mouth Two (2) times a day. 480 mL 12   ??? melatonin 3 mg Tab TK 1 T PO HS PRN     ??? MULTIVITAMIN 50 PLUS Tab TK 1 T PO D     ??? oxyCODONE (ROXICODONE) 5 MG immediate release tablet Take 2 tablets (10 mg total) by mouth every four (4) hours as needed for pain. 100 tablet 0   ??? pancrelipase, Lip-Prot-Amyl, (CREON) 24,000-76,000 -120,000 unit CpDR delayed release capsule Take 48,000 units of lipase by mouth Three (3) times a day with a meal. Take an additional 1 capsule with snacks.     ??? prochlorperazine (COMPAZINE) 10 MG tablet TAKE 1 TABLET(10 MG) BY MOUTH EVERY 8 HOURS AS NEEDED 30 tablet 1   ??? salmeteroL (SEREVENT) 50 mcg/dose diskus inhaler Inhale 1 puff Two (2) times a day. 1 Inhaler 12   ??? tiotropium (SPIRIVA WITH HANDIHALER) 18 mcg inhalation capsule Place 1 capsule (18 mcg total) into inhaler and inhale once daily. 90 capsule 3   ??? traZODone (DESYREL) 50 MG tablet Take 1 tablet (50 mg total) by mouth nightly as needed for sleep. 90 tablet 3   ??? valACYclovir (VALTREX) 500 MG tablet Take 1 tablet (500 mg total) by mouth daily. 90 tablet 3     No current facility-administered medications for this visit.      Facility-Administered Medications Ordered in Other Visits   Medication Dose Route Frequency Provider Last Rate Last Dose   ??? denosumab (XGEVA) injection 120 mg  120 mg Subcutaneous Once Towanda Malkin, MD           The following portions of the patient's history were reviewed and updated as appropriate: allergies, current medications, past family history, past medical history, past social history, past surgical history and problem  list.     Review of Systems   Constitutional: Positive for fatigue. Negative for appetite change, chills, diaphoresis, fever and unexpected weight change.   HENT:   Negative for mouth sores, nosebleeds and trouble swallowing.    Eyes: Negative for eye problems.   Respiratory: Negative for chest tightness, cough, hemoptysis and shortness of breath.    Cardiovascular: Negative for chest pain and leg swelling.   Gastrointestinal: Positive for abdominal pain, diarrhea, nausea and vomiting. Negative for blood in stool and constipation.   Genitourinary: Negative for difficulty urinating and hematuria.    Musculoskeletal: Positive for back pain and myalgias. Negative for arthralgias.   Skin: Negative for rash.   Neurological: Negative for extremity weakness, headaches and seizures.   Hematological: Negative for adenopathy.   Psychiatric/Behavioral: Negative for confusion.   All other systems reviewed and are negative.        Vital Signs for this encounter:    BSA: 1.81 meters squared  BP 123/70  - Pulse 82  - Temp 36.6 ??C (97.9 ??F) (Temporal)  - Resp 20  - Ht 172.7 cm (5' 8)  - Wt 68.6 kg (151 lb 4.8 oz)  - SpO2 100%  - BMI 23.01 kg/m??          Physical Exam   Constitutional: He is oriented to person, place, and time.   HENT:   Head: Normocephalic.   Eyes: Pupils are equal, round, and reactive to light. EOM are normal.   Cardiovascular: Normal rate, regular rhythm and normal heart sounds.   Pulmonary/Chest: Effort normal and breath sounds normal.   Abdominal: Soft. Bowel sounds are normal. He exhibits no distension and no mass.   Patient is tender in the right upper and midline abdomen to the patient.  No masses, guarding noted.   Musculoskeletal:         General: No tenderness.      Comments: Tenderness to percussion over this lower and mid spine, but distractible.   Lymphadenopathy:     He has no cervical adenopathy.   No adenopathy neck, supraclavicular, axillary inguinal regions.   Neurological: He is alert and oriented to person, place, and time.   Skin: No rash noted.   Psychiatric: He has a normal mood and affect. His behavior is normal.       Karnofsky/Lansky Performance Status  80, Normal activity with effort; some signs or symptoms of disease (ECOG equivalent 1)    Results:    WBC   Date Value Ref Range Status   09/02/2019 9.8 4.5 - 11.0 10*9/L Final   04/17/2012 7.6 4.5 - 11.0 x10 9th/L Final     HGB   Date Value Ref Range Status   09/02/2019 10.5 (L) 13.5 - 17.5 g/dL Final   78/29/5621 30.8 13.5 - 17.5 G/DL Final     HCT   Date Value Ref Range Status   09/02/2019 31.0 (L) 41.0 - 53.0 % Final   04/17/2012 39.2 (L) 41.0 - 53.0 % Final     Platelet   Date Value Ref Range Status   09/02/2019 247 150 - 440 10*9/L Final   04/17/2012 242 150 - 440 x10 9th/L Final     LDH   Date Value Ref Range Status   05/27/2019 763 (H) 338 - 610 U/L Final     Creatinine Whole Blood, POC   Date Value Ref Range Status   07/25/2014 1.6 (H) 0.8 - 1.4 mg/dL Final     Creatinine, Whole Blood  Date Value Ref Range Status   07/25/2014 1.4 0.8 - 1.4 mg/dL Final     Creatinine   Date Value Ref Range Status   09/07/2019 0.79 0.70 - 1.30 mg/dL Final   16/09/9603 5.40 0.70 - 1.30 mg/dL Final     AST   Date Value Ref Range Status   09/02/2019 49 19 - 55 U/L Final   09/11/2011 38 19 - 55 U/L Final     PSA Diagnostic   Date Value Ref Range Status   09/28/2014 4.4 (H) 0.0 - 4.0 ng/mL Final   05/30/2014 5.0 (H) 0.0 - 4.0 ng/mL Final   03/23/2014 4.6 (H) 0.0 - 4.0 ng/mL Final   10/04/2013 3.7 0.0 - 4.0 ng/mL Final   06/24/2013 2.6 0.0 - 4.0 ng/mL Final   01/18/2013 1.6 0.0 - 4.0 NG/ML Final   09/28/2012 1.5 0.0 - 4.0 NG/ML Final   07/03/2012 1.0 0.0 - 4.0 NG/ML Final   12/30/2011 0.5 0.0 - 4.0 NG/ML Final   07/15/2011 0.3 0.0 - 4.0 NG/ML Final   04/08/2011 0.3 0.0 - 4.0 NG/ML Final

## 2019-09-07 ENCOUNTER — Ambulatory Visit
Admit: 2019-09-07 | Discharge: 2019-09-08 | Payer: MEDICAID | Attending: Hematology & Oncology | Primary: Hematology & Oncology

## 2019-09-07 ENCOUNTER — Ambulatory Visit: Admit: 2019-09-07 | Discharge: 2019-09-08 | Payer: MEDICAID

## 2019-09-07 ENCOUNTER — Institutional Professional Consult (permissible substitution): Admit: 2019-09-07 | Discharge: 2019-09-08 | Payer: MEDICAID

## 2019-09-07 DIAGNOSIS — R569 Unspecified convulsions: Secondary | ICD-10-CM

## 2019-09-07 DIAGNOSIS — E878 Other disorders of electrolyte and fluid balance, not elsewhere classified: Secondary | ICD-10-CM

## 2019-09-07 DIAGNOSIS — K760 Fatty (change of) liver, not elsewhere classified: Secondary | ICD-10-CM

## 2019-09-07 DIAGNOSIS — F1919 Other psychoactive substance abuse with unspecified psychoactive substance-induced disorder: Secondary | ICD-10-CM

## 2019-09-07 DIAGNOSIS — Z9989 Dependence on other enabling machines and devices: Secondary | ICD-10-CM

## 2019-09-07 DIAGNOSIS — Z8249 Family history of ischemic heart disease and other diseases of the circulatory system: Secondary | ICD-10-CM

## 2019-09-07 DIAGNOSIS — I2510 Atherosclerotic heart disease of native coronary artery without angina pectoris: Secondary | ICD-10-CM

## 2019-09-07 DIAGNOSIS — G4733 Obstructive sleep apnea (adult) (pediatric): Secondary | ICD-10-CM

## 2019-09-07 DIAGNOSIS — C7951 Secondary malignant neoplasm of bone: Secondary | ICD-10-CM

## 2019-09-07 DIAGNOSIS — F1020 Alcohol dependence, uncomplicated: Secondary | ICD-10-CM

## 2019-09-07 DIAGNOSIS — R252 Cramp and spasm: Secondary | ICD-10-CM

## 2019-09-07 DIAGNOSIS — J439 Emphysema, unspecified: Secondary | ICD-10-CM

## 2019-09-07 DIAGNOSIS — C61 Malignant neoplasm of prostate: Secondary | ICD-10-CM

## 2019-09-07 DIAGNOSIS — R609 Edema, unspecified: Secondary | ICD-10-CM

## 2019-09-07 DIAGNOSIS — Z23 Encounter for immunization: Secondary | ICD-10-CM

## 2019-09-07 DIAGNOSIS — Z7952 Long term (current) use of systemic steroids: Secondary | ICD-10-CM

## 2019-09-07 DIAGNOSIS — Z79899 Other long term (current) drug therapy: Secondary | ICD-10-CM

## 2019-09-07 DIAGNOSIS — I10 Essential (primary) hypertension: Secondary | ICD-10-CM

## 2019-09-07 DIAGNOSIS — K8689 Other specified diseases of pancreas: Secondary | ICD-10-CM

## 2019-09-07 DIAGNOSIS — Z7951 Long term (current) use of inhaled steroids: Secondary | ICD-10-CM

## 2019-09-07 DIAGNOSIS — E876 Hypokalemia: Secondary | ICD-10-CM

## 2019-09-07 LAB — CREATININE
CREATININE: 0.79 mg/dL (ref 0.70–1.30)
Creatinine:MCnc:Pt:Ser/Plas:Qn:: 0.79
EGFR CKD-EPI NON-AA MALE: >90 mL/min/{1.73_m2} (ref >=60)

## 2019-09-07 LAB — CALCIUM: Calcium:MCnc:Pt:Ser/Plas:Qn:: 8.6

## 2019-09-07 LAB — PROSTATE SPECIFIC ANTIGEN: Prostate specific Ag:MCnc:Pt:Ser/Plas:Qn:: 113 — ABNORMAL HIGH

## 2019-09-07 LAB — FERRITIN: Ferritin:MCnc:Pt:Ser/Plas:Qn:: 379 — ABNORMAL HIGH

## 2019-09-07 LAB — IRON & TIBC: IRON SATURATION (CALC): 13 % — ABNORMAL LOW (ref 20–50)

## 2019-09-07 LAB — ALBUMIN: Albumin:MCnc:Pt:Ser/Plas:Qn:: 4.5

## 2019-09-07 LAB — PHOSPHORUS: Phosphate:MCnc:Pt:Ser/Plas:Qn:: 2.7 — ABNORMAL LOW

## 2019-09-07 LAB — TRANSFERRIN: Transferrin:MCnc:Pt:Ser/Plas:Qn:: 188.3 — ABNORMAL LOW

## 2019-09-07 MED ORDER — OXYCODONE 5 MG TABLET
ORAL_TABLET | ORAL | 0 refills | 9.00000 days | Status: CP | PRN
Start: 2019-09-07 — End: ?

## 2019-09-07 NOTE — Unmapped (Addendum)
--  Continue on Xtandi 160 mg (4 tablets) daily  --Increase trazodone to 100 mg at bedtime.  You may also take Benadryl 25-50 mg at bedtime to help fall asleep.  --You might consider a trial of melatonin 5 mg at bedtime for sleep.  --Take milk of magnesia 2 tablespoons at bedtime, and have Senokot on hand to use as needed daily or even twice daily, to ensure at least one good bowel movement today.  --Try doubling the fentanyl patch so that you are getting 50 mcg/hour, with replacement of the 2 patches every 3 days.  If this is helping, let me know and we will rewrite the prescription for the higher dose.  --We will try to get you in to the pain clinic, to see if they can help in managing your pain.  --Remember to take Megace 2 teaspoons twice daily with meals to boost her appetite.  --We will have you return in 1 month for follow-up.

## 2019-09-07 NOTE — Unmapped (Signed)
C/o weakness since being d/c'd from hospital    Please refill oxycodone 5mg      Pt states has been experiencing a productive cough and runny nose since being d/c'd. Pt states he was negative for COVID-19.    Influenza vaccination given during visit today. VIS given, pt tolerated injection well.

## 2019-09-07 NOTE — Unmapped (Signed)
Pt received Lupron injection in right gluteal - pt tolerated well.    AVS received at scheduling.    Pt stable and ambulated independently from clinic.

## 2019-09-07 NOTE — Unmapped (Signed)
Labs drawn via [facility].    Port flushed and brisk blood return     Pt tolerated well.      Pt stable and ambulated independently from clinic     Pt states he was in ER for chest pain last week. Results reviewed and pt reassured.

## 2019-09-08 ENCOUNTER — Institutional Professional Consult (permissible substitution): Admit: 2019-09-08 | Discharge: 2019-09-09 | Payer: MEDICAID

## 2019-09-08 DIAGNOSIS — C61 Malignant neoplasm of prostate: Secondary | ICD-10-CM

## 2019-09-09 NOTE — Unmapped (Signed)
Labs completed via phlebotomy/lab yesterday.    Ca 8.6.    Pt received Xgeva injection in left upper arm - pt tolerated well.    AVS received at scheduling.    Pt stable and ambulated independently from clinic.

## 2019-09-21 ENCOUNTER — Institutional Professional Consult (permissible substitution): Admit: 2019-09-21 | Discharge: 2019-09-22 | Payer: MEDICAID | Attending: Registered" | Primary: Registered"

## 2019-09-21 DIAGNOSIS — C61 Malignant neoplasm of prostate: Secondary | ICD-10-CM

## 2019-09-21 NOTE — Unmapped (Signed)
Enhanced Care Nutrition-Follow Up Phone Assessment    Nutrition Assessment:  Patient reports the appetite stimulant is helping. He reports he is doing better. Reports his intake is more.    Nutrition Diagnosis:  Undesirable food choices related to poor appetite as evidenced by skipping meals, eating highly processed foods    Patient Stated Health/Nutrition Goals:  Meet nutritional needs   Reduce long-term health risk    Identified treatment goals: Not applicable    Nutrition Interventions (ADVISE/ASSIST):  1. Try having a glass of milk and couple times a day  2. Try out the [employer]    ______________________________________________________________________  Joseph Phillips is a 58 y.o. male seen for medical nutrition therapy.    Referring MD or Clinic:   Self, Referred  Joseph Jeri Cos, MD    Reason for Referral:   1. Prostate cancer (CMS-HCC)        Medical History:  Past Medical History:   Diagnosis Date   ??? Alcohol abuse    ??? COPD (chronic obstructive pulmonary disease) (CMS-HCC)    ??? Hx of radiation therapy    ??? Hypertension    ??? Polysubstance abuse (CMS-HCC)    ??? Prostate cancer (CMS-HCC)    ??? Seizures (CMS-HCC)        Anthropometrics:    Present Weight   Current BMI     Goal Weight   Current Current IBW     Present Height         Weight History:  Wt Readings from Last 6 Encounters:   09/07/19 68.6 kg (151 lb 4.8 oz)   07/19/19 73.5 kg (162 lb)   07/12/19 73.5 kg (162 lb 1.6 oz)   07/09/19 75.5 kg (166 lb 6.4 oz)   06/28/19 73.8 kg (162 lb 12.8 oz)   06/22/19 74.8 kg (165 lb)       Medications, Herbs, Supplements:  Reviewed nutritionally relevant medications and supplements.    Recent Labs:   Hemoglobin A1C (%)   Date Value   03/23/2014 4.8   04/17/2012 5.1      No components found for: LDLCALC   BP Readings from Last 3 Encounters:   09/07/19 123/70   09/02/19 127/78   07/22/19 145/91     Lab Results   Component Value Date    CHOL 98 (L) 09/26/2018    CHOL 125 03/23/2014    CHOL 129 09/11/2011 Lab Results   Component Value Date    HDL 36 (L) 09/26/2018    HDL 44 03/23/2014    HDL 45 09/11/2011     Lab Results   Component Value Date    LDL 33 (L) 09/26/2018    LDL <30 03/23/2014    LDL 62 09/11/2011     Lab Results   Component Value Date    VLDL 29.4 09/26/2018     Lab Results   Component Value Date    CHOLHDLRATIO 2.7 09/26/2018     Lab Results   Component Value Date    TRIG 147 09/26/2018    TRIG 142 09/11/2011       Physical Activity:  Tries to walk a little    Dietary Restrictions, Food Intolerances:  Bread-feels like it gets stuck    Other GI Issues:  Nausea in the morning    Allergies:   Allergies   Allergen Reactions   ??? Penicillins      Other reaction(s): UNKNOWN   ??? 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    Behavioral Risk Factors:  Sleep- N/A  Fast Eating  N/A  Overeating  N/A    Hunger and Satiety Issues:  Appetite: Getting better    Patient Questions:  None    Estimated Needs:  Estimated Energy Needs: 1800-2100 kcal/day    Estimated Protein Needs: 65-75 gm/day    Materials Provided To Meet their identified goals:  List of recommendations    Food labels discussed: Yes    Expected Compliance/Barriers are:   Comprehension of plan fair  Readiness for change fair  Ability to meet goals fair    We assessed family/social/cultural characteristics and these were relevant to care: none evident    Patient has auditory or visual communication barrier or need: no    Interventions Codes:  General/healthful diet     Follow-up (ARRANGE):  3 months      Length of visit was 22 minuntes. Patient understands diagnosis and treatment plan. Patient voices understanding. Plan is consistent with the patient???s preferences and goals.    This visit is conducted via telephone conferencing.    Contact Information  Person Contacted: Patient  Contact Phone number: [phone] (home)   Is there someone else in the room? No.     The patient was physically located in [place] or a state in which I am permitted to provide care. The patient and/or parent/gauardian understood that s/he may incur co-pays and cost sharing, and agreed to the telemedicine visit. The visit was completed via phone and/or video, which was appropriate and reasonable under the circumstances given the patient's presentation at the time.    The patient and/or parent/guardian has been advised of the potential risks and limitations of this mode of treatment (including, but not limited to, the absence of in-person examination) and has agreed to be treated using telemedicine. The patient's/patient's family's questions regarding telemedicine have been answered.     If the phone/video visit was completed in an ambulatory setting, the patient and/or parent/guardian has also been advised to contact their provider???s office for worsening conditions, and seek emergency medical treatment and/or call 911 if the patient deems either necessary.

## 2019-09-21 NOTE — Unmapped (Signed)
Patient Stated Health/Nutrition Goals:  Meet nutritional needs   Reduce long-term health risk    Identified treatment goals: Not applicable    Nutrition Interventions (ADVISE/ASSIST):  1. Try having a glass of milk and couple times a day  2. Try out the [employer]

## 2019-09-23 NOTE — Unmapped (Signed)
[facility] Refill Coordination Note    Specialty Medication(s) to be Shipped:   Hematology/Oncology: [name]    Other medication(s) to be shipped:      Joseph Phillips, DOB: 04-28-1961  Phone: [phone] (home)       All above HIPAA information was verified with patient.     Completed refill call assessment today to schedule patient's medication shipment from the [facility] [phone]).       Specialty medication(s) and dose(s) confirmed: Regimen is correct and unchanged.   Changes to medications: [name] reports no changes at this time.  Changes to insurance: No  Questions for the pharmacist: No    Confirmed patient received Welcome Packet with first shipment. The patient will receive a drug information handout for each medication shipped and additional FDA Medication Guides as required.       DISEASE/MEDICATION-SPECIFIC INFORMATION        N/A    SPECIALTY MEDICATION ADHERENCE     Medication Adherence    Patient reported X missed doses in the last month: 0  Specialty Medication: xtandi 40mg    Patient is on additional specialty medications: No  Informant: patient  Support network for adherence: family member  Confirmed plan for next specialty medication refill: delivery by pharmacy  Refills needed for supportive medications: not needed          Refill Coordination    Has the Patients' Contact Information Changed: No  Is the Shipping Address Different: No           xtandi 40 mg: 10 days of medicine on hand          SHIPPING     Shipping address confirmed in Epic.     Delivery Scheduled: Yes, Expected medication delivery date: 10/7.     Medication will be delivered via Same Day Courier to the home address in [employer] [place].    [name]   [facility] Specialty Technician

## 2019-09-29 MED FILL — XTANDI 40 MG CAPSULE: ORAL | 30 days supply | Qty: 120 | Fill #3

## 2019-09-29 MED FILL — XTANDI 40 MG CAPSULE: 30 days supply | Qty: 120 | Fill #3 | Status: AC

## 2019-10-06 ENCOUNTER — Emergency Department
Admit: 2019-10-06 | Discharge: 2019-10-06 | Disposition: A | Discharge status: Home / Self Care | Payer: MEDICAID | Attending: Student in an Organized Health Care Education/Training Program

## 2019-10-06 ENCOUNTER — Ambulatory Visit
Admit: 2019-10-06 | Discharge: 2019-10-06 | Disposition: A | Discharge status: Home / Self Care | Payer: MEDICAID | Attending: Student in an Organized Health Care Education/Training Program

## 2019-10-06 DIAGNOSIS — R1084 Generalized abdominal pain: Principal | ICD-10-CM

## 2019-10-06 LAB — CBC W/ AUTO DIFF
BASOPHILS ABSOLUTE COUNT: 0 10*9/L (ref 0.0–0.1)
BASOPHILS RELATIVE PERCENT: 0.4 %
EOSINOPHILS ABSOLUTE COUNT: 0.1 10*9/L (ref 0.0–0.4)
EOSINOPHILS RELATIVE PERCENT: 1.2 %
HEMATOCRIT: 27.7 % — ABNORMAL LOW (ref 41.0–53.0)
HEMOGLOBIN: 9.6 g/dL — ABNORMAL LOW (ref 13.5–17.5)
LYMPHOCYTES ABSOLUTE COUNT: 1.6 10*9/L (ref 1.5–5.0)
LYMPHOCYTES RELATIVE PERCENT: 17.5 %
MEAN CORPUSCULAR HEMOGLOBIN CONC: 34.7 g/dL (ref 31.0–37.0)
MEAN CORPUSCULAR HEMOGLOBIN: 32.2 pg (ref 26.0–34.0)
MEAN CORPUSCULAR VOLUME: 92.9 fL (ref 80.0–100.0)
MEAN PLATELET VOLUME: 7.1 fL (ref 7.0–10.0)
MONOCYTES ABSOLUTE COUNT: 0.5 10*9/L (ref 0.2–0.8)
NEUTROPHILS ABSOLUTE COUNT: 6.9 10*9/L (ref 2.0–7.5)
NEUTROPHILS RELATIVE PERCENT: 74.3 %
PLATELET COUNT: 234 10*9/L (ref 150–440)
RED BLOOD CELL COUNT: 2.98 10*12/L — ABNORMAL LOW (ref 4.50–5.90)
RED CELL DISTRIBUTION WIDTH: 14.5 % (ref 12.0–15.0)
WBC ADJUSTED: 9.3 10*9/L (ref 4.5–11.0)

## 2019-10-06 LAB — CHLORIDE: Chloride:SCnc:Pt:Ser/Plas:Qn:: 114 — ABNORMAL HIGH

## 2019-10-06 LAB — COMPREHENSIVE METABOLIC PANEL
ALBUMIN: 4 g/dL (ref 3.5–5.0)
ALKALINE PHOSPHATASE: 298 U/L — ABNORMAL HIGH (ref 38–126)
ALT (SGPT): 11 U/L (ref <50)
ANION GAP: 11 mmol/L (ref 7–15)
AST (SGOT): 51 U/L (ref 19–55)
BLOOD UREA NITROGEN: 15 mg/dL (ref 7–21)
BUN / CREAT RATIO: 24
CALCIUM: 8.6 mg/dL (ref 8.5–10.2)
CO2: 14 mmol/L — ABNORMAL LOW (ref 22.0–30.0)
CREATININE: 0.62 mg/dL — ABNORMAL LOW (ref 0.70–1.30)
EGFR CKD-EPI AA MALE: >90 mL/min/{1.73_m2} (ref >=60)
EGFR CKD-EPI NON-AA MALE: >90 mL/min/{1.73_m2} (ref >=60)
GLUCOSE RANDOM: 92 mg/dL (ref 70–179)
POTASSIUM: 4.9 mmol/L (ref 3.5–5.0)
PROTEIN TOTAL: 7.3 g/dL (ref 6.5–8.3)
SODIUM: 139 mmol/L (ref 135–145)

## 2019-10-06 LAB — LARGE UNSTAINED CELLS: Lab: 2

## 2019-10-06 LAB — INR: Coagulation tissue factor induced.INR:RelTime:Pt:PPP:Qn:Coag: 1.14

## 2019-10-06 LAB — LIPASE: Triacylglycerol lipase:CCnc:Pt:Ser/Plas:Qn:: 84

## 2019-10-06 LAB — LACTATE BLOOD VENOUS: Lactate:SCnc:Pt:BldV:Qn:: 1.2

## 2019-10-06 LAB — PROTIME-INR: INR: 1.14

## 2019-10-06 NOTE — Unmapped (Signed)
Emergency Department Provider Note        ED Clinical Impression     Final diagnoses:   Generalized abdominal pain (Primary)       ED Assessment/Plan     Joseph Phillips is a 58 y.o. male presenting with abdominal pain radiating to his back associated with vomiting. Concern for perforated viscus given pt's diffuse tenderness vs acute on chronic pancreatitis vs complication 2/2 pancreatitis such as pseudocyst formation. Labs, medications as below.     ED Course as of Oct 05 1432   Wed Oct 06, 2019   1234 Similar to prior   HGB(!): 9.6   1349 CXR viewed and interpreted by me: no infiltrate, no pneumothorax, no air under diaphragm      1349 Has been similarly elevated in the past   Alkaline Phosphatase(!): 298        Medications   sodium chloride 0.9% (NS) bolus 1,000 mL (1,000 mL Intravenous New Bag 10/06/19 1209)   MORPhine injection 8 mg (8 mg Intravenous Given 10/06/19 1209)   famotidine (PF) (PEPCID) injection 20 mg (20 mg Intravenous Given 10/06/19 1209)     2:33 PM  Reassessed patient at this time.  Reports symptoms have resolved.  On repeat exam, no abdominal tenderness, appears more comfortable.  Discussed CT and lab findings with patient.  Patient states he has oxycodone at home if needed if the pain returns. I discussed return to ED precautions with the patient, including fever, new or worsening pain, inability to tolerate PO, other worsening of condition, or any other medical concern. Patient verbalized understanding, all questions answered.     CT Abdomen Pelvis with IV Contrast ONLY   Final Result      --No acute intra-abdominal or intrapelvic process.      --Diffuse sclerotic metastases throughout the visualized skeleton, similar to prior exam.      --Additional chronic and incidental findings as above.      XR Chest Portable    (Results Pending)         History     Chief Complaint   Patient presents with   ??? Abdominal Pain Joseph Phillips is a 58 y.o. male with a history of HTN, alcohol and substance abuse, chronic pancreatitis, prostate cancer s/p radiation therapy (currently on lupron), seizures, and COPD presenting to the ED via EMS for abdominal pain. Patient reports 1 day of epigastric pain radiating to his back following an episode of non-bloody emesis. He states his pain feel similar to chronic pancreatitis episodes. He is currently on creon. Patient has a decreased PO intake at baseline due to radiation therapy and states this is unchanged for him. He denies fever, shortness of breath, or trouble breathing.    Per chart review, patient has been admitted multiple times in the past year for alcoholic withdrawal and gastritis. He was seen here on 9/10 for chest and back pain, with unremarkable workup.      History provided by:  Patient and medical records  Language interpreter used: No    Abdominal Pain  Pain location:  Epigastric  Pain quality: sharp    Pain radiates to:  Back  Pain severity:  Severe  Duration:  1 day  Timing:  Constant  Progression:  Unchanged  Chronicity:  Recurrent  Worsened by:  Movement and palpation  Associated symptoms: nausea and vomiting    Associated symptoms: no chills, no cough, no diarrhea, no dysuria, no fatigue, no fever and no shortness of breath  Past Medical History:   Diagnosis Date   ??? Alcohol abuse    ??? COPD (chronic obstructive pulmonary disease) (CMS-HCC)    ??? Hx of radiation therapy    ??? Hypertension    ??? Polysubstance abuse (CMS-HCC)    ??? Prostate cancer (CMS-HCC)    ??? Seizures (CMS-HCC)        Past Surgical History:   Procedure Laterality Date   ??? IR INSERT PORT AGE GREATER THAN 5 YRS  07/15/2018    IR INSERT PORT AGE GREATER THAN 5 YRS 07/15/2018 Carolin Coy, MD IMG [facility]   ??? PR COLSC FLX W/RMVL OF TUMOR POLYP LESION SNARE TQ N/A 08/16/2015 Procedure: COLONOSCOPY FLEX; W/REMOV TUMOR/LES BY SNARE;  Surgeon: Gwen Pounds, MD;  [facility]: [facility];  Service: Gastroenterology   ??? PR COLSC FLX WITH DIRECTED SUBMUCOSAL NJX ANY SBST N/A 08/16/2015    Procedure: COLONOSCOPY, FLEXIBLE, PROXIMAL TO SPLENIC FLEXURE; WITH DIRECTED SUBMUCOSAL INJECTION(S), ANY SUBSTANCE;  Surgeon: Gwen Pounds, MD;  Location: [facility];  Service: Gastroenterology   ??? PROSTATE SURGERY         Family History   Problem Relation Age of Onset   ??? Heart disease Mother    ??? Diabetes Mother    ??? Diabetes Father    ??? Melanoma Neg Hx    ??? Basal cell carcinoma Neg Hx    ??? Squamous cell carcinoma Neg Hx        Social History     Socioeconomic History   ??? Marital status: Single     Spouse name: None   ??? Number of children: None   ??? Years of education: 74   ??? Highest education level: 12th grade   Occupational History   ??? Occupation: DISABLED   Social Needs   ??? Financial resource strain: None   ??? Food insecurity     Worry: Never true     Inability: Never true   ??? Transportation needs     Medical: Yes     Non-medical: None   Tobacco Use   ??? Smoking status: Current Every Day Smoker     Packs/day: 0.50     Years: 40.00     Pack years: 20.00     Types: Cigarettes     Start date: 05/09/1994   ??? Smokeless tobacco: Never Used   ??? Tobacco comment: Pt smokes 10cpd, pt expressed desire to quit    Substance and Sexual Activity   ??? Alcohol use: Not Currently     Alcohol/week: 36.0 standard drinks     Types: 36 Cans of beer per week     Comment: states 3 or 4 big beers a day every day   ??? Drug use: Not Currently     Types: Marijuana   ??? Sexual activity: Not Currently   Lifestyle   ??? Physical activity     Days per week: None     Minutes per session: None   ??? Stress: None   Relationships   ??? Social Wellsite geologist on phone: None     Gets together: None     Attends religious service: None     Active member of club or organization: None Attends meetings of clubs or organizations: None     Relationship status: None   Other Topics Concern   ??? Do you use sunscreen? No   ??? Tanning bed use? No   ??? Are you easily burned?  No   ??? Excessive sun exposure? No   ??? Blistering sunburns? No   Social History Narrative   ??? None     Review of Systems   Constitutional: Negative for chills, fatigue and fever.   HENT: Negative for congestion and trouble swallowing.    Eyes: Negative for pain and redness.   Respiratory: Negative for cough and shortness of breath.    Cardiovascular: Negative for leg swelling.        Positive for chest discomfort.    Gastrointestinal: Positive for abdominal pain, nausea and vomiting. Negative for diarrhea.   Genitourinary: Positive for flank pain. Negative for difficulty urinating and dysuria.   Skin: Negative for rash and wound.   Neurological: Negative for dizziness, light-headedness and headaches.   Psychiatric/Behavioral: Negative for confusion.     Physical Exam     BP 143/78  - Pulse 73  - Temp 36.9 ??C (98.5 ??F)  - Resp 18  - SpO2 100%     Physical Exam  Vitals signs and nursing note reviewed.   Constitutional:       General: He is not in acute distress.     Appearance: Normal appearance. He is not diaphoretic.      Comments: Appears uncomfortable   HENT:      Head: Normocephalic and atraumatic.      Nose: Nose normal.      Mouth/Throat:      Mouth: Mucous membranes are moist.      Pharynx: Oropharynx is clear. No oropharyngeal exudate or posterior oropharyngeal erythema.   Eyes:      General: No scleral icterus.        Right eye: No discharge.         Left eye: No discharge.      Extraocular Movements: Extraocular movements intact.      Conjunctiva/sclera: Conjunctivae normal.      Pupils: Pupils are equal, round, and reactive to light.   Neck:      Musculoskeletal: Normal range of motion and neck supple. No muscular tenderness.   Cardiovascular:      Rate and Rhythm: Normal rate and regular rhythm. Heart sounds: No murmur.   Pulmonary:      Effort: Pulmonary effort is normal. No respiratory distress.      Breath sounds: Normal breath sounds. No stridor. No wheezing, rhonchi or rales.   Chest:      Chest wall: No tenderness.   Abdominal:      General: There is no distension.      Palpations: Abdomen is soft.      Tenderness: There is generalized abdominal tenderness. There is guarding. There is no rebound.   Musculoskeletal: Normal range of motion.         General: No swelling, tenderness or deformity.   Skin:     General: Skin is warm and dry.      Capillary Refill: Capillary refill takes less than 2 seconds.   Neurological:      General: No focal deficit present.      Mental Status: He is alert and oriented to person, place, and time.   Psychiatric:         Mood and Affect: Mood normal.         Behavior: Behavior normal.       ED Course     MDM  Reviewed: vitals  Interpretation: CT scan, labs and x-ray       Documentation assistance was provided by Merwyn Katos, Scribe  on October 06, 2019 at 11:42 AM for Audrea Muscat, MD.    Documentation assistance was provided by the scribe in my presence.  The documentation recorded by the scribe has been reviewed by me and accurately reflects the services I personally performed.         Audrea Muscat, MD  10/06/19 [phone]

## 2019-10-06 NOTE — Unmapped (Signed)
Discharge instructions discussed with patient and discharge teaching complete. Pt verbalizes understanding and signed dc papers. All questions answered at this time. Patient discharged in no apparent distress and with all patient belongings. PIV removed with cath tip intact, bleeding controlled, gauze, and tape placed.

## 2019-10-06 NOTE — Unmapped (Signed)
Pt BIB [facility] with c/o B flank pain yesterday that radiated to his stomach today. Pt reports one episode of emesis today. Decreased PO intake at baseline 2/2 cancer. Pt also reports a hx of chronic pancreatitis.

## 2019-10-06 NOTE — Unmapped (Signed)
Pt rounding complete. Pt is sitting up in bed in no apparent distress, poc updated, pt denies needs at this time, will continue to monitor. Bed is low and locked, call light is at side, personal belongings are within reach, and the curtain is pulled for privacy.

## 2019-10-08 NOTE — Unmapped (Signed)
Hi,    Patient Wilburn Mylar called requesting a medication refill for the following:    ? Medication: oxycodone  ? Dosage: 5mg   ? Days left of medication: 0  ? Pharmacy: [facility]    ? Medication: trazodone  ? Dosage: 50mg   ? Days left of medication: 0  ? Pharmacy: [facility]      The expected turnaround time is 3-4 business days       Check Indicates criteria has been reviewed and confirmed with the patient:    [x]  Preferred Name   [x]  DOB and/or MR#  [x]  Preferred Contact Method  [x]  Phone Number(s)   [x]  Preferred Pharmacy   []  MyChart     Thank you,  Jannette Spanner  [facility]  [phone]  .

## 2019-10-10 DIAGNOSIS — C61 Malignant neoplasm of prostate: Principal | ICD-10-CM

## 2019-10-10 MED ORDER — TRAZODONE 50 MG TABLET: 50 mg | tablet | Freq: Every evening | 3 refills | 90 days | Status: AC

## 2019-10-10 MED ORDER — OXYCODONE 5 MG TABLET: 10 mg | tablet | 0 refills | 9 days | Status: AC

## 2019-10-11 NOTE — Unmapped (Signed)
Prescriptions were refilled by Dr. Claude Manges.

## 2019-10-13 MED ORDER — TRAZODONE 100 MG TABLET: 100 mg | tablet | Freq: Every evening | 3 refills | 90 days | Status: AC

## 2019-10-13 NOTE — Unmapped (Signed)
Patient states Dr. Claude Manges changed his instructions to allow him to take 2 trazodone at night, rather than 1. He now only has 4 pills left and the pharmacy will not give more yet.   Also he wondered when he is supposed to see Dr. Claude Manges again for follow up. The notes indicate he should be seen this month, so I advised him that I would check with Dr. Claude Manges and have scheduling call him. He was appreciative.

## 2019-10-13 NOTE — Unmapped (Signed)
Erenest Blank contacted the [employer] requesting to speak with the care team of Wilburn Mylar to discuss:    Patient called to speak to Dr. Claude Manges about getting more Trazodone.    Please contact [name] at [phone].        Check Indicates criteria has been reviewed and confirmed with the patient:    [x]  Preferred Name   [x]  DOB and/or MR#  [x]  Preferred Contact Method  [x]  Phone Number(s)   []  MyChart     Thank you,   Jacques Navy  [facility]   [phone]

## 2019-10-14 NOTE — Unmapped (Signed)
Done

## 2019-10-22 NOTE — Unmapped (Signed)
Hi,    Patient Joseph Phillips called requesting a medication refill for the following:    ? Medication: duragesic  ? Dosage: 1 patch  ? Days left of medication: 0  ? Pharmacy: [facility]    The expected turnaround time is 3-4 business days     Check Indicates criteria has been reviewed and confirmed with the patient:    [x]  Preferred Name   [x]  DOB and/or MR#  [x]  Preferred Contact Method  [x]  Phone Number(s)   [x]  Preferred Pharmacy   []  MyChart     Thank you,  [name]  [facility]  [phone]

## 2019-10-23 DIAGNOSIS — C61 Malignant neoplasm of prostate: Principal | ICD-10-CM

## 2019-10-23 MED ORDER — FENTANYL 25 MCG/HR TRANSDERMAL PATCH
MEDICATED_PATCH | TRANSDERMAL | 0 refills | 30.00000 days | Status: CP
Start: 2019-10-23 — End: ?

## 2019-10-23 NOTE — Unmapped (Signed)
Initial Visit Note    Patient Name: Joseph Phillips  Patient Age: 58 y.o.  Encounter Date: 10/27/2019    Referring Physician:     Towanda Malkin, MD  [address]  [address] [facility] [id number]  [place],  [place] [address]    Primary Care Provider:  Unknown Foley, MD    Assessment/Plan:    Reason for visit:  Prostate Cancer    Cancer Staging  Prostate cancer (CMS-HCC)  Staging form: Prostate, AJCC 7th Edition  - Clinical: Stage IV (T3a, N0, M1b, PSA: Less than 10, Gleason 7) - Unsigned      --Prostate cancer, stage IV,      -- with progression on Lupron/Zytiga     --07/14/18: Docetaxel/prednisone     --11/11/2018: Neutropenic fever, with possible UTI and oral mucositis     -- 02/03/19: [name];  Held 03/01/19 for LE swelling/cellulitis; restart 03/29/19 at 80 mg qD     -- 08/16/19:  [name] increased to 160 mg daily  --Hypokalemia  --Hypomagnesemia  -- Alcoholism      -- 06/22/19: Admitted for alcoholic gastritis  --Leg cramps and pain, exacerbated by electrolyte disturbances      --03/01/19: Skin changes in the lower extremities of uncertain etiology.  --Teeth in poor repair, awaiting dental correction  --Hypertension  --Lower extremity swelling, exacerbated at night; improved on triamterene-HCTZ  -- OSA; positive study for both central and obstructive apnea; CPAP trial recommended, 07/23/2018; deferred by the patient  --History of pancreatitis with pancreatic insufficiency; on Creon; 2/2 EtOHism      Recommendation  --Patient appears to be relatively stable clinically, with chronic lower back pain, mild tenderness, secondary to prostate cancer.  --Patient has mild???moderate anemia, with recent normal iron studies, presumed secondary to advanced prostate cancer  --Patient will continue on Xtandi, Megace (for appetite), trazodone, fentanyl and oxycodone.  --It is hoped that if he can get back on Lupron and Xgeva on a regular basis, to see if this improves disease control.      Patient instructions --Continue taking Xtandi (enzalutamide) 4 capsules daily.  --Continue taking Megace 2 teaspoons twice daily  --Continue fentanyl 25 mcg patch every 3 days.--  --Continue oxycodone 5 mg, 1 or 2 tablets every 6 hours as needed for pain.  --Continue trazodone 100 mg at bedtime.  --We will plan to see you back in 1 month for follow-up.            I have reviewed the laboratory, pathology, and radiology reports in detail and discussed findings with the patient    History of Present Illness:     Joseph Phillips is a 58 y.o. male who is seen in consultation at the request of Lonn Georgia* for an evaluation of Prostate Cancer.  The patient has stayed off alcohol.  He is taking Xtandi 160 mg each morning for the past month.  He has been taking the medication regularly, and if he does so his pain is under relatively good control.  His pain now is chiefly in the lower back.  It does not radiate anywhere.  He has no difficulty passing stool or urine.  Megace has not improved his appetite very much, although he has been able to gain some weight.  He is about 2 weeks past his scheduled time for the next Lupron injection or Xgeva.  He does find that his pain is improved with each Xgeva shot, at least for period of time.  He has an appointment to see the  pain clinic in the coming week.  The patient had a CT scan of the abdomen pelvis and October 06, 2019, which showed stable sclerotic bone lesions, but no other evidence of disease.          Oncology History: Patient was originally diagnosed with prostate cancer in 2006.  He underwent robot-assisted prostatectomy.  He did well until May 2018, when his PSA rose to a high of about 45, and he was started on Lupron, initially with Casodex, and then with apparatus.  His PSA fell and remained low until recently when he was noted to be rising from a low of 0.82 on  01/01/18, to 3.99 on 06/29/2018.  He was instructed to hold Zytiga.  Since holding Zytiga he is noted swelling in his ankles.  The swelling comes on overnight, improves during the course the day.  He was evaluated with Doppler studies on 06/29/2018 which showed no evidence of blood clots.  He is still bothered by the swelling.  Restaging studies done on 06/04/2018 including bone scan, and CT of the chest abdomen pelvis, showed sclerotic changes throughout the axial skeleton, and involvement also of scapula, some ribs and pelvis.  The sites were new since 2016, and also probably new since the CT stone study in May 2018.  Bone scan also showed some progression in disease by report, compared to 2018.  He has developed pain in the back of his calves, for which he takes oxycodone (Tylenol and ibuprofen were ineffective).  The pain followed prolonged episodes of cramping, felt to be related to low magnesium and potassium.  About 6 months ago he had shingles in a left L2 distribution.        Oncology History   Prostate cancer (CMS-HCC)   12/02/2008 Biopsy    Prostate, left apex, needle biopsy  - Adenocarcinoma, Gleason combined score 6 (3+3), 2 of 2 cores involved, tumor  approximately 2 mm in greatest diameter, approximately 10% of tissue involved by  carcinoma.  - See comment    E: ??Prostate, left mid, needle biopsy  - Adenocarcinoma, Gleason combined score 6 (3+3), 1 of 2 cores involved, tumor  approximately 2 mm in greatest diameter, approximately 5% of tissue involved by  carcinoma.  - See comment    F: ??Prostate, left base, needle biopsy - Adenocarcinoma, Gleason combined score 6 (3+3), 1 of 2 cores involved, tumor  approximately 1 mm in greatest diameter, approximately 5% of tissue involved by  carcinoma.     01/24/2009 Surgery    Prostate, robot assisted laparoscopic prostatectomy  Tumor histologic type: Prostatic adenocarcinoma  Tumor grade (Gleason system): Gleason pattern 7 (3+4)  Approximate percentage of prostate involved by tumor: 40%  Extent of tumor:  Location of tumor involvement in prostate: right and left lobes, anterior  and posterior, apex to base  Confinement/non-confinement of tumor within the prostatic capsule: ??not  confined  Location of extracapsular tumor involvement: ??left prostate toward the base  (ie. block A22)  Type of extracapsular tissue involved within tumor: ?? adipose  tissue/perineural tissue  Angiolymphatic space invasion: not identified  Seminal vesicles: negative  Vas deferens: negative  Histologic assessment of surgical margins: positive margin along a 3 mm  face in the left prostate base (block A25)    Other significant findings: High grade prostatic intraepithelial  Neoplasm;  Lymph nodes: Not applicable  AJCC Staging: pT3a pNx     08/30/2013 Initial Diagnosis    Prostate cancer (CMS-HCC)     04/30/2017 -  Chemotherapy    Lupron 22.5 mg; with bicalutamide x  month     08/01/2017 - 06/30/2018 Chemotherapy    Abiaterone 1000 mg daily; decrease to 750 mg daily at 10/2018;     10/30/2017 -  Cancer Staged    Starta: TP53, TMPSSR  -EGR fusion; no other abnormalities identified     01/14/2018 -  Cancer Staged    CT Chest:  Numerous sclerotic metastases of the thoracic spine, left scapula  and multiple ribs.  3. Coronary artery and aortic atherosclerosis (ICD10-I70.0).  4. Previously demonstrated left renal mass is not included in the  field of view.     06/04/2018 -  Cancer Staged CT CAP:  Scattered diffuse sclerotic metastatic lesions are seen throughout the spine as well as the left scapula; some of the lesions are new since prior CT dated 04/20/2015.  - No intrathoracic metastatic disease.  Interval progression of osseous metastases throughout the spine and pelvis compared to 04/22/2017.  -- Cholelithiasis.  -- Hepatic steatosis.     06/04/2018 -  Cancer Staged    Bone scan:  Increased radiotracer uptake throughout the spine, sacrum, and left ilium, consistent with numerous metastases better evaluated on same-day CT. Unchanged metastasis to the left proximal humerus.     07/14/2018 - 11/12/2018 Chemotherapy    OP PROSTATE DOCETAXEL/PREDNISONE  Docetaxel 75 mg/m2 evry 21 days     01/07/2019 -  Cancer Staged    CTA Chest:  No evidence of pulmonary embolism.  2. Occlusion of the medial basilar segmental bronchus of the left  lower lobe, nonspecific though could be seen in the setting of  aspiration. Otherwise, no acute findings.  3. Apical predominant paraseptal emphysematous change with suspected  mild progression of smoking related fibrosis with subpleural  reticulation but without evidence of honeycombing. Emphysema  (ICD10-J43.9).  4. Progression of osseous metastatic disease since 11/2017. Detailed  above. No definitive pathologic compression fractures.  5. Hepatic steatosis. 6. Coronary artery calcifications. Aortic Atherosclerosis     01/20/2019 -  Cancer Staged    NM Bone scan: Redemonstrated multiple abnormal foci throughout the appendicular axial skeleton, with interval increase in the extent of the T10 vertebral body lesion and new lesions involving the manubrium, left fifth and seventh ribs. This is most consistent with progression of osseous metastatic disease.     02/03/2019 -  Chemotherapy    Xtandi 160 mg qD; held after 1 month due to swelling.  Restart at 80 mg qD on 03/29/19     03/10/2019 -  Cancer Staged    CTA chest:  No evidence of acute aortic intramural hematoma or dissection. Multifocal osseous sclerotic metastatic disease, progressed when compared to prior CT 06/04/2018 and grossly similar to most recent nuclear medicine bone scan.  Mucoid impacted airways in left lower lobe with adjacent subtle ground glass opacities; likely represents sequelae of aspiration. Recommend follow up CT in 6 to 8 weeks to ensure clearing.  Other chronic/incidental findings as above, not substantially changed.     09/02/2019 -  Cancer Staged    CTA Chest:  Interval increased sclerosis of diffuse osseous metastatic disease compared to 03/10/2019, likely reflects treatment related changes         09/07/2019 Endocrine/Hormone Therapy    OP LEUPROLIDE (7.5 MG EVERY MONTH)  Plan Provider: Towanda Malkin, MD     10/06/2019 -  Cancer Staged    CT AP:  Diffuse sclerotic metastases throughout the visualized skeleton, similar to prior exam.  No  adenopathy. Prostate surgically absent         Past Medical History:   Diagnosis Date   ??? Alcohol abuse    ??? COPD (chronic obstructive pulmonary disease) (CMS-HCC)    ??? Hx of radiation therapy    ??? Hypertension    ??? Polysubstance abuse (CMS-HCC)    ??? Prostate cancer (CMS-HCC)    ??? Seizures (CMS-HCC)       Past Surgical History:   Procedure Laterality Date   ??? IR INSERT PORT AGE GREATER THAN 5 YRS  07/15/2018    IR INSERT PORT AGE GREATER THAN 5 YRS 07/15/2018 Carolin Coy, MD [facility]   ??? PR COLSC FLX W/RMVL OF TUMOR POLYP LESION SNARE TQ N/A 08/16/2015    Procedure: COLONOSCOPY FLEX; W/REMOV TUMOR/LES BY SNARE;  Surgeon: Gwen Pounds, MD;  [facility]: [facility];  Service: Gastroenterology   ??? PR COLSC FLX WITH DIRECTED SUBMUCOSAL NJX ANY SBST N/A 08/16/2015    Procedure: COLONOSCOPY, FLEXIBLE, PROXIMAL TO SPLENIC FLEXURE; WITH DIRECTED SUBMUCOSAL INJECTION(S), ANY SUBSTANCE;  Surgeon: Gwen Pounds, MD;  Location: [facility];  Service: Gastroenterology   ??? PROSTATE SURGERY          Family History Problem Relation Age of Onset   ??? Heart disease Mother    ??? Diabetes Mother    ??? Diabetes Father    ??? Melanoma Neg Hx    ??? Basal cell carcinoma Neg Hx    ??? Squamous cell carcinoma Neg Hx       Family Status   Relation Name Status   ??? Mother  (Not Specified)   ??? Father  (Not Specified)   ??? Neg Hx  (Not Specified)     Social History     Occupational History   ??? Occupation: DISABLED   Tobacco Use   ??? Smoking status: Current Every Day Smoker     Packs/day: 0.50     Years: 40.00     Pack years: 20.00     Types: Cigarettes     Start date: 05/09/1994   ??? Smokeless tobacco: Never Used   ??? Tobacco comment: Pt smokes 10cpd, pt expressed desire to quit    Substance and Sexual Activity   ??? Alcohol use: Not Currently     Alcohol/week: 36.0 standard drinks     Types: 36 Cans of beer per week     Comment: states 3 or 4 big beers a day every day   ??? Drug use: Not Currently     Types: Marijuana   ??? Sexual activity: Not Currently       Allergies   Allergen Reactions   ??? Penicillins      Other reaction(s): UNKNOWN   ??? Tramadol Dizziness       Current Outpatient Medications   Medication Sig Dispense Refill   ??? albuterol HFA 90 mcg/actuation inhaler INHALE 2 PUFFS EVERY 6 HOURS AS NEEDED FOR WHEEZING 8.5 g 0   ??? amLODIPine (NORVASC) 10 MG tablet Take 1 tablet (10 mg total) by mouth daily. 30 tablet 11   ??? busPIRone (BUSPAR) 5 MG tablet TAKE 1 TABLET BY MOUTH THREE TIMES A DAY 270 tablet 3   ??? chlordiazePOXIDE (LIBRIUM) 25 MG capsule 1 tab this afternoon, 1 this evening, every 8 hours tomorrow and stop 5 capsule 0   ??? diclofenac sodium (VOLTAREN) 1 % gel Apply 2 g topically Four (4) times a day. Any area of pain 200 g 5   ???  diphenhydrAMINE (BENADRYL) 25 mg tablet Take 0.5 tablets (12.5 mg total) by mouth every six (6) hours as needed for itching. 30 capsule 0   ??? diphenhydramine HCl (MAGIC MOUTHWASH ORAL) suspension Take 10 mL by mouth 4 (four) times a day as needed. 120 mL 1 ??? enzalutamide (XTANDI) 40 mg capsule Take 4 capsules (160 mg total) by mouth daily. 120 capsule 5   ??? famotidine (PEPCID) 20 MG tablet Take 1 tablet (20 mg total) by mouth two (2) times a day as needed for heartburn. 60 tablet 0   ??? fentaNYL (DURAGESIC) 25 mcg/hr patch Place 1 patch on the skin every third day. 10 patch 0   ??? megestroL (MEGACE) 400 mg/10 mL (40 mg/mL) suspension Take 10 mL (400 mg total) by mouth Two (2) times a day. 480 mL 12   ??? MULTIVITAMIN 50 PLUS Tab TK 1 T PO D     ??? oxyCODONE (ROXICODONE) 5 MG immediate release tablet Take 2 tablets (10 mg total) by mouth every four (4) hours as needed for pain. 100 tablet 0   ??? pancrelipase, Lip-Prot-Amyl, (CREON) 24,000-76,000 -120,000 unit CpDR delayed release capsule Take 48,000 units of lipase by mouth Three (3) times a day with a meal. Take an additional 1 capsule with snacks.     ??? salmeteroL (SEREVENT) 50 mcg/dose diskus inhaler Inhale 1 puff Two (2) times a day. 1 Inhaler 12   ??? tiotropium (SPIRIVA WITH HANDIHALER) 18 mcg inhalation capsule Place 1 capsule (18 mcg total) into inhaler and inhale once daily. 90 capsule 3   ??? traZODone (DESYREL) 100 MG tablet Take 1 tablet (100 mg total) by mouth nightly as needed for sleep. 90 tablet 3   ??? calcium carbonate 650 mg calcium (1,625 mg) tablet Take 1 tablet (650 mg of elem calcium total) by mouth daily. (Patient not taking: Reported on 10/27/2019) 30 tablet 11   ??? melatonin 3 mg Tab TK 1 T PO HS PRN     ??? prochlorperazine (COMPAZINE) 10 MG tablet TAKE 1 TABLET(10 MG) BY MOUTH EVERY 8 HOURS AS NEEDED (Patient not taking: Reported on 10/27/2019) 30 tablet 1   ??? valACYclovir (VALTREX) 500 MG tablet Take 1 tablet (500 mg total) by mouth daily. (Patient not taking: Reported on 10/27/2019) 90 tablet 3     No current facility-administered medications for this visit. The following portions of the patient's history were reviewed and updated as appropriate: allergies, current medications, past family history, past medical history, past social history, past surgical history and problem list.     Review of Systems   Constitutional: Positive for fatigue. Negative for appetite change, chills, diaphoresis, fever and unexpected weight change.   HENT:   Negative for mouth sores, nosebleeds and trouble swallowing.    Eyes: Negative for eye problems.   Respiratory: Negative for chest tightness, cough, hemoptysis and shortness of breath.    Cardiovascular: Negative for chest pain and leg swelling.   Gastrointestinal: Positive for abdominal pain, diarrhea, nausea and vomiting. Negative for blood in stool and constipation.   Genitourinary: Negative for difficulty urinating and hematuria.    Musculoskeletal: Positive for back pain and myalgias. Negative for arthralgias.   Skin: Negative for rash.   Neurological: Negative for extremity weakness, headaches and seizures.   Hematological: Negative for adenopathy.   Psychiatric/Behavioral: Negative for confusion.   All other systems reviewed and are negative.        Vital Signs for this encounter:    BSA: 1.84 meters squared  BP 125/73  - Pulse 75  - Temp 37 ??C (98.6 ??F) (Temporal)  - Resp 16  - Ht 172.7 cm (5' 8)  - Wt 70.8 kg (156 lb 1.6 oz)  - SpO2 100%  - BMI 23.73 kg/m??          Physical Exam   Constitutional: He is oriented to person, place, and time.   HENT:   Head: Normocephalic.   Eyes: Pupils are equal, round, and reactive to light. EOM are normal.   Cardiovascular: Normal rate, regular rhythm and normal heart sounds.   Pulmonary/Chest: Effort normal and breath sounds normal.   Abdominal: Soft. Bowel sounds are normal. He exhibits no distension and no mass.   Patient is tender in the right upper and midline abdomen to the patient.  No masses, guarding noted.   Musculoskeletal:         General: No tenderness. Comments: Tenderness to palpation over the lower thoracic and lumbar spines, and to a lesser degree, elsewhere in the back.   Lymphadenopathy:     He has no cervical adenopathy.   No adenopathy neck, supraclavicular, axillary inguinal regions.   Neurological: He is alert and oriented to person, place, and time.   Skin: No rash noted.   Psychiatric: He has a normal mood and affect. His behavior is normal.       Karnofsky/Lansky Performance Status  80, Normal activity with effort; some signs or symptoms of disease (ECOG equivalent 1)    Results:    WBC   Date Value Ref Range Status   10/27/2019 7.4 4.5 - 11.0 10*9/L Final   04/17/2012 7.6 4.5 - 11.0 x10 9th/L Final     HGB   Date Value Ref Range Status   10/27/2019 9.4 (L) 13.5 - 17.5 g/dL Final   29/56/2130 86.5 13.5 - 17.5 G/DL Final     HCT   Date Value Ref Range Status   10/27/2019 26.7 (L) 41.0 - 53.0 % Final   04/17/2012 39.2 (L) 41.0 - 53.0 % Final     Platelet   Date Value Ref Range Status   10/27/2019 287 150 - 440 10*9/L Final   04/17/2012 242 150 - 440 x10 9th/L Final     LDH   Date Value Ref Range Status   05/27/2019 763 (H) 338 - 610 U/L Final     Creatinine Whole Blood, POC   Date Value Ref Range Status   07/25/2014 1.6 (H) 0.8 - 1.4 mg/dL Final     Creatinine, Whole Blood   Date Value Ref Range Status   07/25/2014 1.4 0.8 - 1.4 mg/dL Final     Creatinine   Date Value Ref Range Status   10/27/2019 0.76 0.70 - 1.30 mg/dL Final   78/46/9629 5.28 0.70 - 1.30 mg/dL Final     AST   Date Value Ref Range Status   10/06/2019 51 19 - 55 U/L Final   09/11/2011 38 19 - 55 U/L Final     PSA Diagnostic   Date Value Ref Range Status   09/28/2014 4.4 (H) 0.0 - 4.0 ng/mL Final   05/30/2014 5.0 (H) 0.0 - 4.0 ng/mL Final   03/23/2014 4.6 (H) 0.0 - 4.0 ng/mL Final   10/04/2013 3.7 0.0 - 4.0 ng/mL Final   06/24/2013 2.6 0.0 - 4.0 ng/mL Final   01/18/2013 1.6 0.0 - 4.0 NG/ML Final   09/28/2012 1.5 0.0 - 4.0 NG/ML Final   07/03/2012 1.0 0.0 - 4.0 NG/ML Final 12/30/2011  0.5 0.0 - 4.0 NG/ML Final   07/15/2011 0.3 0.0 - 4.0 NG/ML Final   04/08/2011 0.3 0.0 - 4.0 NG/ML Final

## 2019-10-27 ENCOUNTER — Ambulatory Visit
Admit: 2019-10-27 | Discharge: 2019-10-28 | Payer: MEDICAID | Attending: Hematology & Oncology | Primary: Hematology & Oncology

## 2019-10-27 ENCOUNTER — Institutional Professional Consult (permissible substitution): Admit: 2019-10-27 | Discharge: 2019-10-28 | Payer: MEDICAID

## 2019-10-27 DIAGNOSIS — D649 Anemia, unspecified: Principal | ICD-10-CM

## 2019-10-27 DIAGNOSIS — C61 Malignant neoplasm of prostate: Principal | ICD-10-CM

## 2019-10-27 LAB — CBC W/ AUTO DIFF
BASOPHILS ABSOLUTE COUNT: 0.1 10*9/L (ref 0.0–0.1)
BASOPHILS RELATIVE PERCENT: 0.9 %
EOSINOPHILS ABSOLUTE COUNT: 0.2 10*9/L (ref 0.0–0.4)
EOSINOPHILS RELATIVE PERCENT: 2.7 %
HEMATOCRIT: 26.7 % — ABNORMAL LOW (ref 41.0–53.0)
LARGE UNSTAINED CELLS: 2 % (ref 0–4)
LYMPHOCYTES RELATIVE PERCENT: 28.7 %
MEAN CORPUSCULAR HEMOGLOBIN CONC: 35.3 g/dL (ref 31.0–37.0)
MEAN CORPUSCULAR HEMOGLOBIN: 32.4 pg (ref 26.0–34.0)
MEAN CORPUSCULAR VOLUME: 91.7 fL (ref 80.0–100.0)
MEAN PLATELET VOLUME: 7.4 fL (ref 7.0–10.0)
MONOCYTES ABSOLUTE COUNT: 0.4 10*9/L (ref 0.2–0.8)
NEUTROPHILS ABSOLUTE COUNT: 4.5 10*9/L (ref 2.0–7.5)
NEUTROPHILS RELATIVE PERCENT: 60.7 %
PLATELET COUNT: 287 10*9/L (ref 150–440)
RED BLOOD CELL COUNT: 2.91 10*12/L — ABNORMAL LOW (ref 4.50–5.90)
RED CELL DISTRIBUTION WIDTH: 14.9 % (ref 12.0–15.0)
WBC ADJUSTED: 7.4 10*9/L (ref 4.5–11.0)

## 2019-10-27 LAB — MONOCYTES ABSOLUTE COUNT: Monocytes:NCnc:Pt:Bld:Qn:Automated count: 0.4

## 2019-10-27 LAB — CREATININE: CREATININE: 0.76 mg/dL (ref 0.70–1.30)

## 2019-10-27 LAB — ALBUMIN: Albumin:MCnc:Pt:Ser/Plas:Qn:: 4

## 2019-10-27 LAB — PROSTATE SPECIFIC ANTIGEN: Prostate specific Ag:MCnc:Pt:Ser/Plas:Qn:: 144 — ABNORMAL HIGH

## 2019-10-27 LAB — EGFR CKD-EPI NON-AA MALE: Lab: >90

## 2019-10-27 LAB — ALKALINE PHOSPHATASE: Alkaline phosphatase:CCnc:Pt:Ser/Plas:Qn:: 302 — ABNORMAL HIGH

## 2019-10-27 LAB — PHOSPHORUS: Phosphate:MCnc:Pt:Ser/Plas:Qn:: 3.6

## 2019-10-27 LAB — CALCIUM: Calcium:MCnc:Pt:Ser/Plas:Qn:: 9

## 2019-10-27 NOTE — Unmapped (Signed)
Please refill compazine 10mg , spiriva and valtrex 500mg .

## 2019-10-27 NOTE — Unmapped (Addendum)
--  Continue taking Xtandi (enzalutamide) 4 capsules daily.  --Continue taking Megace 2 teaspoons twice daily  --Continue fentanyl 25 mcg patch every 3 days.--  --Continue oxycodone 5 mg, 1 or 2 tablets every 6 hours as needed for pain.  --Continue trazodone 100 mg at bedtime.  --We will plan to see you back in 1 month for follow-up.

## 2019-10-27 NOTE — Unmapped (Signed)
Labs completed via port.    Ca 9.0 and Phosphorus 3.6.    Pt received Xgeva and Lupron injections. Xgeva given in left upper arm. Lupron given in left gluteal - pt tolerated it well.    AVS received at scheduling.    Pt stable and ambulated independently from clinic.

## 2019-10-27 NOTE — Unmapped (Signed)
[facility] Refill Coordination Note    Specialty Medication(s) to be Shipped:   Hematology/Oncology: [name]    Other medication(s) to be shipped:      Joseph Phillips, DOB: 04-Dec-1961  Phone: [phone] (home)       All above HIPAA information was verified with patient.     Completed refill call assessment today to schedule patient's medication shipment from the [facility] [phone]).       Specialty medication(s) and dose(s) confirmed: Regimen is correct and unchanged.   Changes to medications: Joseph Phillips reports no changes at this time.  Changes to insurance: No  Questions for the pharmacist: No    Confirmed patient received Welcome Packet with first shipment. The patient will receive a drug information handout for each medication shipped and additional FDA Medication Guides as required.       DISEASE/MEDICATION-SPECIFIC INFORMATION        N/A    SPECIALTY MEDICATION ADHERENCE     Medication Adherence    Patient reported X missed doses in the last month: 0  Specialty Medication: XTANDI 40MG    Patient is on additional specialty medications: No  Informant: patient  Support network for adherence: family member  Confirmed plan for next specialty medication refill: delivery by pharmacy  Refills needed for supportive medications: not needed          Refill Coordination    Has the Patients' Contact Information Changed: No  Is the Shipping Address Different: No           XTANDI 40 mg: 5 days of medicine on hand         SHIPPING     Shipping address confirmed in Epic.     Delivery Scheduled: Yes, Expected medication delivery date: 11/6.     Medication will be delivered via Same Day Courier to the prescription address in Epic WAM.    [name]   [facility] Specialty Technician

## 2019-10-27 NOTE — Unmapped (Signed)
Pt's port accessed - flushed, bld return noted, labs drawn and heplocked. Port de-accessed. Pt tolerated well.

## 2019-10-29 LAB — ERYTHROPOIETIN: Erythropoietin:ACnc:Pt:Ser/Plas:Qn:: 21.3 — ABNORMAL HIGH

## 2019-10-29 MED FILL — XTANDI 40 MG CAPSULE: ORAL | 30 days supply | Qty: 120 | Fill #4

## 2019-10-29 MED FILL — XTANDI 40 MG CAPSULE: 30 days supply | Qty: 120 | Fill #4 | Status: AC

## 2019-11-02 NOTE — Unmapped (Signed)
Internal Medicine Phone Visit    This visit is conducted via telephone.    Contact Information  Person Contacted:self  Contact Phone number: [phone] (home)   Is there someone else in the room? No.   Patient agreed to a phone visit    Joseph Phillips is a 58 y.o. male  participating in a telephone encounter.    Reason for Call  Follow up hospitalizations    Subjective:  Joseph Phillips is a 58 yo M with hx of hypertension, Etoh abuse, stage IV prostate cancer, chronic pancreatitis, pancreatic insufficiency who presents for follow-up.    Since I last spoke with Joseph Phillips, he has been seen in the emergency department on 10/14 for acute onset abdominal pain.  At that time CT abdomen showed diffuse metastatic disease, however no acute intra-abdominal abnormality.  He received 1 L normal saline, morphine 8 mg IV x1, and was discharged home.  Since that time he has seen Dr. Claude Manges in clinic on 11/4.  He reportedly was doing well, though they had increased his trazodone from 50 to 100 mg nightly.    Today, doing well. Ctns to have chronic pain in his shoulder but stable from prior and well managed w/oxy and fentanyl. Abdominal pain has resolved since ED. Still is abstaining from EtOH for ~3 months now. Has increased tobacco use. Smokes ~1/2 ppd. No obvious triggers. Interested in quitting. Interested in patch more than lozenge.     Regarding his COPD, asymptomatic.  Tolerating spiriva well w/good adherence.     I have reviewed the problem list, medications, and allergies and have updated/reconciled them if needed.    Objective:  Telephone visit    Assessment & Plan:  1. Tobacco use: wants to hold off on pharmacologic assistance for now. Try nicotine supplementation, specifically nicotine patches. Otherwise, I spent 5 minutes on tobacco cessation counseling.  - 14mg /24 hr nicotine patch with plan to cut in half after 1 month  - can consider switching from buspar to Wellbutrin during next visit 2.  COPD: asymptomatic. Ctn spiriva    3.       ED follow up: abdominal pain has resolved. Pt without comlpaints now.  4.       Etoh Use disorder: was not a candidate for inpt rehab due to chronic opioid use. Has since stopped drinking on his own without assistance.   - counseled to ctn abstaining from EtOH    Marquesa Rath Jeri Cos, MD PGY-3  Internal Medicine    Return in about 1 month (around 12/03/2019) for Recheck [employer] cessation.     Staffed with: Dr. Gerhard Munch    I spent 11 minutes on the phone with the patient. I spent an additional 3 minutes on pre- and post-visit activities.     The patient was physically located in [place] or a state in which I am permitted to provide care. The patient and/or parent/guardian understood that s/he may incur co-pays and cost sharing, and agreed to the telemedicine visit. The visit was reasonable and appropriate under the circumstances given the patient's presentation at the time.    The patient and/or parent/guardian has been advised of the potential risks and limitations of this mode of treatment (including, but not limited to, the absence of in-person examination) and has agreed to be treated using telemedicine. The patient's/patient's family's questions regarding telemedicine have been answered.     If the visit was completed in an ambulatory setting, the patient and/or parent/guardian has also been advised to  contact their provider???s office for worsening conditions, and seek emergency medical treatment and/or call 911 if the patient deems either necessary.

## 2019-11-03 ENCOUNTER — Institutional Professional Consult (permissible substitution)
Admit: 2019-11-03 | Discharge: 2019-11-03 | Payer: MEDICAID | Attending: Student in an Organized Health Care Education/Training Program | Primary: Student in an Organized Health Care Education/Training Program

## 2019-11-03 ENCOUNTER — Ambulatory Visit: Admit: 2019-11-03 | Discharge: 2019-11-03 | Payer: MEDICAID | Attending: Anesthesiology | Primary: Anesthesiology

## 2019-11-03 DIAGNOSIS — F17200 Nicotine dependence, unspecified, uncomplicated: Principal | ICD-10-CM

## 2019-11-03 DIAGNOSIS — J449 Chronic obstructive pulmonary disease, unspecified: Principal | ICD-10-CM

## 2019-11-03 MED ORDER — NICOTINE 14 MG/24 HR DAILY TRANSDERMAL PATCH
MEDICATED_PATCH | TRANSDERMAL | 0 refills | 28 days | Status: CP
Start: 2019-11-03 — End: ?

## 2019-11-03 NOTE — Unmapped (Signed)
Thank you for engaging in the telephone encounter.    Glad you're doing well.    I have prescribed a nicotine patch. Please wear on an exposed skin for 1 month. After 1 month, depending on how we are doing, we will cut the nicotine patch dose by 50%.

## 2019-11-03 NOTE — Unmapped (Signed)
It was nice to meet you during your appointment in clinic today.  Below is a summary of what we discussed during your visit:    -Continue taking your pain medication as prescribed  -We reviewed safe opioid handling and importance of avoiding alcohol and other substances that can interfere with your opioid medications  -We will discuss this visit with Dr. Claude Manges  -No follow-up is necessary with the pain clinic

## 2019-11-03 NOTE — Unmapped (Addendum)
[facility] Department of Anesthesiology and Pain Medicine                    [address] [place], [place] [address]                             [phone]    Date: November 03, 2019  Patient Name: Joseph Phillips  MRN: [id number]  PCP: Unknown Foley  Referring Provider: Cathi Roan Way    Assessment:      Joseph Phillips is a 58 y.o. male with PMHx of HTN, alcohol abuse (last use reported 3-4 months ago), stage IV prostate cancer, chronic pancreatitis, and seizures. [facility] for his chronic low back pain and bilateral shoulder pain that has developed following his development of prostate cancer. Based on history and exam, his metastatic prostate cancer is likely contributing to his back and shoulder pain, but given his age, he likely has other pain generators that are exacerbating his pain.  The patient is currently taking Xtandi, Lupron, and Xgeva for his prostate cancer and Fentanyl patch q3d, Oxycodone 5mg  1-2 tablets q6h prn, and Trazodone 100mg  nightly with benefit. He denies any significant adverse effects. These medications are prescribed by his oncologist, Dr. Claude Manges.  He has previously had issues with compliance following increased alcohol consumption and using opioids that were not prescribed to him, although this seems to have now become a resolved issue. We discussed the importance of avoiding alcohol and other drugs as they can interfere with his opioid medications.  We discussed ways to properly and safely store his opioids at home.  We discussed that it is very important to take medications as prescribed to ensure his safety and allow for adequate analgesia.  At this time, should there be concerns for compliance issues, we would recommend UDS (+alcohol), pill counts, and patch counts at each office visit. Based on this initial visit, he appears to be using his medications as prescribed without concern for diversion.  The patient does not need to follow up with the pain clinic unless there are additional concerns for pain control in the future.     Plan:      - Continue pain medication as prescribed by Dr. Claude Manges.  - No follow up is necessary with the pain clinic    Subjective:      HPI:  Joseph Phillips is seen in consultation at the request of Cathi Roan Way  For evaluation and recommendations regarding His chronic low back pain and shoulder pain. Joseph Phillips presents today with chronic low back pain and shoulder pain likely 2/2 metastatic prostate cancer as well as chronic abdominal pain 2/2 chronic pancreatitis. He states that his pain is worst at night and he has difficultly sleeping. The patient has a history of alcohol abuse and was admitted to the ED in June 2020 for alcoholic gastritis.  He has been detoxed and previously treated with Librium and has reported stopped this medication and has not had any alcohol in approximately 3-4 months. His back pain is constant and isolated to the lumbosacral region without radiation to the lower extremities.  He reports intermittent leg and arm pain that is not associated with his back pain.  He reports subjective weakness in his legs  but has no issues with balance or no difficulty ambulating. He denies any urinary/bowel incontinence.  He reports shoulder pain primarily over the superior and posterior portion of his shoulder and not necessarily within the joint itself.  It is worse when reaching behind his back and with increased activity.  He denies any radiation of pain into his arms or numbness in his arms.    The patient is currently taking Xtandi, Lupron, and Xgeva for prostate cancer and Fentanyl patch q3d, Oxycodone 5mg  1-2 tablets q6h prn, and Trazodone 100mg  at bedtime. He endorses ~75% benefit in his pain control on this regimen and denies any significant adverse effects.  He has been started on Xgeva within the past 4-5 months and he reports a gradual improvement in pain since he started receiving these injections.  He reports consistently using marijuana ~1x/d for appetite stimulation but denies any alcohol use, illicit substance use, or the use of opioids that have not been prescribed to him. Joseph Phillips is  presenting to the clinic today with pain that is graded as 5/10 in intensity. His highest level of pain is reported as 5/10 in intensity, and average pain level is 5/10. His pain is localized to bilateral shoulder and lower back. His pain started approximately  5 years ago. His pain is continuous and worse at night.     His pain is described as aching, dull, pressing and tender.  His pain symptoms are associated with weakness and stiffness. His pain is improved with medication.  His pain is made worse with exercise, standing and walking.  His pain interferes with enjoyment of life, general activity, sleep, walking, sitting and standing.  The patient has not experienced lost of bowel or bladder control.    Previous interventions include Medications. They have been helpful.     He denies any homicidal or suicidal ideation.    Current analgesic regimen:  Fentanyl 41mcg/hr patch q3d  Oxycodone 5-10mg  q6h prn  Trazodone 100mg  nightly    CSMD Review:  Reviewed and appropriate    Imaging:  CT A/P w/ (10/06/19):  BONES AND SOFT TISSUES: Diffuse sclerotic osseous metastatic lesions throughout the visualized axial and appendicular skeleton.    IMPRESSION:  --No acute intra-abdominal or intrapelvic process.  --Diffuse sclerotic metastases throughout the visualized skeleton, similar to prior exam.  --Additional chronic and incidental findings as above.    NM Bone Scan (01/20/19):  FINDINGS:   Redemonstrated numerous foci of increased radiotracer uptake in the thoracic, lumbar vertebral bodies, left scapula, left proximal humeral shaft, bilateral iliums, left ischium, and sacrum. The extent of the radiotracer uptake of the lesions in T10 vertebral body and right sacrum increase compared with prior scan. There are also new foci of radiotracer uptake in the sternal manubrium, left 5th and 7th ribs.  ??  Interval port placement in the right chest wall. Normal physiologic uptake in the bilateral kidneys and the urinary bladder.     IMPRESSION:  Redemonstrated multiple abnormal foci throughout the appendicular axial skeleton, with interval increase in the extent of the T10 vertebral body lesion and new lesions involving the manubrium, left fifth and seventh ribs. This is most consistent with progression of osseous metastatic disease.    Medications tried include:  NSAIDS-   Antidepressants- none tried  Neuroleptics- none tried  Muscle relaxants- none tried  Topicals- Voltaren gel, lidocaine patches  Short-acting opiates- oxycodone  Long-acting opiates- None  Anxiolytics- none    Past Interventions:  None    Past  Medical History:   Diagnosis Date   ??? Alcohol abuse    ??? COPD (chronic obstructive pulmonary disease) (CMS-HCC)    ??? Hx of radiation therapy    ??? Hypertension    ??? Polysubstance abuse (CMS-HCC)    ??? Prostate cancer (CMS-HCC)    ??? Seizures (CMS-HCC)      Past Surgical History:   Procedure Laterality Date   ??? IR INSERT PORT AGE GREATER THAN 5 YRS  07/15/2018    IR INSERT PORT AGE GREATER THAN 5 YRS 07/15/2018 Carolin Coy, MD IMG [facility]   ??? PR COLSC FLX W/RMVL OF TUMOR POLYP LESION SNARE TQ N/A 08/16/2015    Procedure: COLONOSCOPY FLEX; W/REMOV TUMOR/LES BY SNARE;  Surgeon: Gwen Pounds, MD;  [facility];  Service: Gastroenterology   ??? PR COLSC FLX WITH DIRECTED SUBMUCOSAL NJX ANY SBST N/A 08/16/2015    Procedure: COLONOSCOPY, FLEXIBLE, PROXIMAL TO SPLENIC FLEXURE; WITH DIRECTED SUBMUCOSAL INJECTION(S), ANY SUBSTANCE;  Surgeon: Gwen Pounds, MD;  Location: [facility];  Service: Gastroenterology   ??? PROSTATE SURGERY       Family History   Problem Relation Age of Onset   ??? Heart disease Mother    ??? Diabetes Mother    ??? Diabetes Father    ??? Melanoma Neg Hx    ??? Basal cell carcinoma Neg Hx    ??? Squamous cell carcinoma Neg Hx        Social History: He reports that he has been smoking cigarettes. He started smoking about 25 years ago. He has a 20.00 pack-year smoking history. He has never used smokeless tobacco. He reports previous alcohol use of about 36.0 standard drinks of alcohol per week. He reports previous drug use. Drug: Marijuana.    Allergies as of 11/03/2019 - Reviewed 11/03/2019   Allergen Reaction Noted   ??? Penicillins  03/30/2013   ??? Tramadol Dizziness 07/07/2018      Current Outpatient Medications   Medication Sig Dispense Refill   ??? albuterol HFA 90 mcg/actuation inhaler INHALE 2 PUFFS EVERY 6 HOURS AS NEEDED FOR WHEEZING 8.5 g 0   ??? amLODIPine (NORVASC) 10 MG tablet Take 1 tablet (10 mg total) by mouth daily. 30 tablet 11   ??? busPIRone (BUSPAR) 5 MG tablet TAKE 1 TABLET BY MOUTH THREE TIMES A DAY 270 tablet 3   ??? calcium carbonate 650 mg calcium (1,625 mg) tablet Take 1 tablet (650 mg of elem calcium total) by mouth daily. 30 tablet 11   ??? diclofenac sodium (VOLTAREN) 1 % gel Apply 2 g topically Four (4) times a day. Any area of pain 200 g 5   ??? diphenhydrAMINE (BENADRYL) 25 mg tablet Take 0.5 tablets (12.5 mg total) by mouth every six (6) hours as needed for itching. 30 capsule 0   ??? diphenhydramine HCl (MAGIC MOUTHWASH ORAL) suspension Take 10 mL by mouth 4 (four) times a day as needed. 120 mL 1   ??? enzalutamide (XTANDI) 40 mg capsule Take 4 capsules (160 mg total) by mouth daily. 120 capsule 5   ??? fentaNYL (DURAGESIC) 25 mcg/hr patch Place 1 patch on the skin every third day. 10 patch 0   ??? megestroL (MEGACE) 400 mg/10 mL (40 mg/mL) suspension Take 10 mL (400 mg total) by mouth Two (2) times a day. 480 mL 12   ??? melatonin 3 mg Tab TK 1 T PO HS PRN     ??? MULTIVITAMIN 50 PLUS Tab TK 1  T PO D     ??? nicotine (NICODERM CQ) 14 mg/24 hr patch Place 1 patch on the skin daily. 28 patch 0   ??? oxyCODONE (ROXICODONE) 5 MG immediate release tablet Take 2 tablets (10 mg total) by mouth every four (4) hours as needed for pain. 100 tablet 0 ??? pancrelipase, Lip-Prot-Amyl, (CREON) 24,000-76,000 -120,000 unit CpDR delayed release capsule Take 48,000 units of lipase by mouth Three (3) times a day with a meal. Take an additional 1 capsule with snacks.     ??? prochlorperazine (COMPAZINE) 10 MG tablet TAKE 1 TABLET(10 MG) BY MOUTH EVERY 8 HOURS AS NEEDED 30 tablet 1   ??? salmeteroL (SEREVENT) 50 mcg/dose diskus inhaler Inhale 1 puff Two (2) times a day. 1 Inhaler 12   ??? tiotropium (SPIRIVA WITH HANDIHALER) 18 mcg inhalation capsule Place 1 capsule (18 mcg total) into inhaler and inhale once daily. 90 capsule 3   ??? traZODone (DESYREL) 100 MG tablet Take 1 tablet (100 mg total) by mouth nightly as needed for sleep. 90 tablet 3   ??? valACYclovir (VALTREX) 500 MG tablet Take 1 tablet (500 mg total) by mouth daily. 90 tablet 3   ??? famotidine (PEPCID) 20 MG tablet Take 1 tablet (20 mg total) by mouth two (2) times a day as needed for heartburn. 60 tablet 0     No current facility-administered medications for this visit.      Review of Systems:  10 point ROS negative except as noted in HPI.     Objective:      PHYSICAL EXAM:  Vitals:    11/03/19 1334   BP: 119/72   Pulse: 81   Resp: 16   Temp: 36.2 ??C   SpO2: 100%     GENERAL:  The patient is well developed, well-nourished and appears to be in no apparent distress. Patient is a good historian.  HEAD/NECK:    Reveals normocephalic/atraumatic.  HEART:   Regular rate and regular rhythm  PULMONARY:   Normal work of breathing.  ABDOMEN:  Soft and NTTP.  EXTREMITIES:  No clubbing, cyanosis, or edema was noted.  NEUROLOGIC:    The patient was alert and oriented times 3 with normal language, attention, cognition and memory.   Sensation intact to light touch throughout bilateral upper and lower extremities.  MUSCULOSKELETAL:    Appropriate muscle mass in the upper and lower extremities bilaterally.   4/5 strength bilateral hip flexion. Otherwise 5/5 strength in upper and lower extremities. Tenderness over scapular spines bilaterally. Positive scapula lift-off test/pain with internal rotation of the shoulder bilaterally.   Pain with shoulder abduction.  GAIT:  Gait is non-antalgic.   Rises from chair without difficulty and ambulates without difficulty around the clinic without a walking-assist device.  SPINE:  The patient had minimal tenderness to palpation lumbosacral spine.  No TTP over lumbar facets or SIJs.  Pain with flexion of the spine and normal extension at the waist.  Negative pain on facet loading in lumbar spine.   JOINTS:  Good range of motion of all extremities with joints all appearing to be within normal limits.  SKIN:   No obvious rashes lesions or erythema on the exposed skin  PSYCHIATRIC:   Appropriate affect today in clinic    Scribe's Attestation: Stacy Gardner, MD obtained and performed the history, physical exam and medical decision making elements that were  entered into the chart. Documentation assistance was provided by me personally, a scribe. Signed by Nehemiah Settle, scribe, on November 03, 2019 at 1:50 PM.     ----------------------------------------------------------------------------------------------------------------------  November 03, 2019 4:18 PM. Documentation assistance provided by the Scribe. I was present during the time the encounter was recorded. The information recorded by the Scribe was done at my direction and has been reviewed and validated by me.  ----------------------------------------------------------------------------------------------------------------------

## 2019-11-03 NOTE — Unmapped (Signed)
.  Previsit complete    Time spent on phone:     Reason for visit: follow up    Questions / Concerns that need to be addressed:    General Consent to Treat (GCT) for non-epic video visits only: Verbal consent    Vitals signs, if any: none    Allergies reviewed: Yes    Medication reviewed: Yes    Refills needed, if any: none    Travel Screening: completed    Falls Risk: completed    Hark (DV) Screening: completed    HCDM reviewed and updated in Epic:  Completed    HARK Screening  HARK Screening  Within the last year, have you been humiliated or emotionally abused in other ways by your partner or ex-partner?: No  Within the last year, have you been afraid of your partner or ex-partner?: No  Within the last year, have you been raped or forced to have any kind of sexual activity by your partner or ex-partner?: No  Within the last year, have you been kicked, hit, slapped, or otherwise physically hurt by your partner or ex-partner?: No      PHQ9  Thoughts that you would be better off dead, or of hurting yourself in some way: Not at all  PHQ-9 TOTAL SCORE: 4

## 2019-11-04 DIAGNOSIS — C61 Malignant neoplasm of prostate: Principal | ICD-10-CM

## 2019-11-04 MED ORDER — OXYCODONE 5 MG TABLET
ORAL_TABLET | ORAL | 0 refills | 9.00000 days | Status: CP | PRN
Start: 2019-11-04 — End: ?

## 2019-11-04 NOTE — Unmapped (Signed)
Immediately after or during the visit, I reviewed with the resident the medical history and the resident???s findings on physical examination.  I discussed with the resident the patient???s diagnosis and concur with the treatment plan as documented in the resident note. Makani Seckman B Lysbeth Dicola, MD

## 2019-11-04 NOTE — Unmapped (Signed)
Hi,    Patient Joseph Phillips called requesting a medication refill for the following:    ? Medication: Oxycodone  ? Dosage: 5 mg  ? Days left of medication: 0  ? Pharmacy: [facility]    Program:   Speciality:      The expected turnaround time is 3-4 business days       Check Indicates criteria has been reviewed and confirmed with the patient:    []  Preferred Name   [x]  DOB and/or MR#  [x]  Preferred Contact Method  [x]  Phone Number(s)   [x]  Preferred Pharmacy   []  MyChart     Thank you,  Christell Faith  [facility]  [phone]

## 2019-11-04 NOTE — Unmapped (Signed)
Hi,    Patient Joseph Phillips called requesting a medication refill for the following:    ? Medication: oxycodone  ? Dosage: 5 mg  ? Days left of medication: 0  ? Pharmacy: [facility] in [place]       The expected turnaround time is 3-4 business days       Check Indicates criteria has been reviewed and confirmed with the patient:    []  Preferred Name   []  DOB and/or MR#  []  Preferred Contact Method  []  Phone Number(s)   []  Preferred Pharmacy   []  MyChart     Thank you,  Kelli Hope  [facility]  [phone]

## 2019-11-05 NOTE — Unmapped (Signed)
A medication refill request has been received to the clinic as follows:    ?? Oxycodone 5 mg immediate release tablets, dispense 100 with 0 refills.  ?? This request was last completed on 10/10/2019.  ?? This request has been routed to Dr. Claude Manges for review and if approved, will be sent to [facility] in [place], [place] as was done previously.    ?? Follow-up with Dr. Claude Manges remains tentatively scheduled for 12/01/2019.    No other actions taken at this time.

## 2019-11-17 NOTE — Unmapped (Signed)
[facility] Clinical Assessment & Refill Coordination Note    [name] reports taking Ca and Vitamin D supplement daily    Joseph Phillips, DOB: 04/23/1961  Phone: [phone] (home)     All above HIPAA information was verified with patient.     Specialty Medication(s):   Hematology/Oncology: [facility]     Current Outpatient Medications   Medication Sig Dispense Refill   ??? albuterol HFA 90 mcg/actuation inhaler INHALE 2 PUFFS EVERY 6 HOURS AS NEEDED FOR WHEEZING 8.5 g 0   ??? amLODIPine (NORVASC) 10 MG tablet Take 1 tablet (10 mg total) by mouth daily. 30 tablet 11   ??? busPIRone (BUSPAR) 5 MG tablet TAKE 1 TABLET BY MOUTH THREE TIMES A DAY 270 tablet 3   ??? calcium carbonate 650 mg calcium (1,625 mg) tablet Take 1 tablet (650 mg of elem calcium total) by mouth daily. 30 tablet 11   ??? diclofenac sodium (VOLTAREN) 1 % gel Apply 2 g topically Four (4) times a day. Any area of pain 200 g 5   ??? diphenhydrAMINE (BENADRYL) 25 mg tablet Take 0.5 tablets (12.5 mg total) by mouth every six (6) hours as needed for itching. 30 capsule 0   ??? diphenhydramine HCl (MAGIC MOUTHWASH ORAL) suspension Take 10 mL by mouth 4 (four) times a day as needed. 120 mL 1   ??? enzalutamide (XTANDI) 40 mg capsule Take 4 capsules (160 mg total) by mouth daily. 120 capsule 5   ??? famotidine (PEPCID) 20 MG tablet Take 1 tablet (20 mg total) by mouth two (2) times a day as needed for heartburn. 60 tablet 0   ??? fentaNYL (DURAGESIC) 25 mcg/hr patch Place 1 patch on the skin every third day. 10 patch 0   ??? megestroL (MEGACE) 400 mg/10 mL (40 mg/mL) suspension Take 10 mL (400 mg total) by mouth Two (2) times a day. 480 mL 12   ??? melatonin 3 mg Tab TK 1 T PO HS PRN     ??? MULTIVITAMIN 50 PLUS Tab TK 1 T PO D     ??? nicotine (NICODERM CQ) 14 mg/24 hr patch Place 1 patch on the skin daily. 28 patch 0 ??? oxyCODONE (ROXICODONE) 5 MG immediate release tablet Take 2 tablets (10 mg total) by mouth every four (4) hours as needed for pain. 100 tablet 0   ??? pancrelipase, Lip-Prot-Amyl, (CREON) 24,000-76,000 -120,000 unit CpDR delayed release capsule Take 48,000 units of lipase by mouth Three (3) times a day with a meal. Take an additional 1 capsule with snacks.     ??? prochlorperazine (COMPAZINE) 10 MG tablet TAKE 1 TABLET(10 MG) BY MOUTH EVERY 8 HOURS AS NEEDED 30 tablet 1   ??? salmeteroL (SEREVENT) 50 mcg/dose diskus inhaler Inhale 1 puff Two (2) times a day. 1 Inhaler 12   ??? tiotropium (SPIRIVA WITH HANDIHALER) 18 mcg inhalation capsule Place 1 capsule (18 mcg total) into inhaler and inhale once daily. 90 capsule 3   ??? traZODone (DESYREL) 100 MG tablet Take 1 tablet (100 mg total) by mouth nightly as needed for sleep. 90 tablet 3   ??? valACYclovir (VALTREX) 500 MG tablet Take 1 tablet (500 mg total) by mouth daily. 90 tablet 3     No current facility-administered medications for this visit.         Changes to medications: Joseph Phillips reports no changes at this time.    Allergies   Allergen Reactions   ??? Penicillins  Other reaction(s): UNKNOWN   ??? Tramadol Dizziness       Changes to allergies: No    SPECIALTY MEDICATION ADHERENCE     Xtandi 40 mg: 12 days of medicine on hand     Medication Adherence    Patient reported X missed doses in the last month: 0  Specialty Medication: Xtandi  Patient is on additional specialty medications: No  Informant: patient  Support network for adherence: family member  Confirmed plan for next specialty medication refill: delivery by pharmacy          Specialty medication(s) dose(s) confirmed: Regimen is correct and unchanged.     Are there any concerns with adherence? No    Adherence counseling provided? Not needed    CLINICAL MANAGEMENT AND INTERVENTION      Clinical Benefit Assessment:    Do you feel the medicine is effective or helping your condition? Yes Clinical Benefit counseling provided? Not needed    Adverse Effects Assessment:    Are you experiencing any side effects? No    Are you experiencing difficulty administering your medicine? No    Quality of Life Assessment:    How many days over the past month did your prostate cancer  keep you from your normal activities? For example, brushing your teeth or getting up in the morning. 0    Have you discussed this with your provider? Not needed    Therapy Appropriateness:    Is therapy appropriate? Yes, therapy is appropriate and should be continued    DISEASE/MEDICATION-SPECIFIC INFORMATION      N/A    PATIENT SPECIFIC NEEDS     ? Does the patient have any physical, cognitive, or cultural barriers? No    ? Is the patient high risk? No     ? Does the patient require a Care Management Plan? No     ? Does the patient require physician intervention or other additional services (i.e. nutrition, smoking cessation, social work)? No      SHIPPING     Specialty Medication(s) to be Shipped:   Hematology/Oncology: [facility] Phillips    Other medication(s) to be shipped: none     Changes to insurance: No    Delivery Scheduled: Yes, Expected medication delivery date: 11/25/19.     Medication will be delivered via Same Day Courier to the confirmed prescription address in [facility].    The patient will receive a drug information handout for each medication shipped and additional FDA Medication Guides as required.  Verified that patient has previously received a [facility].    All of the patient's questions and concerns have been addressed.    [facility] Specialty Pharmacist

## 2019-11-23 DIAGNOSIS — K861 Other chronic pancreatitis: Principal | ICD-10-CM

## 2019-11-23 MED ORDER — CREON 24,000-76,000-120,000 UNIT CAPSULE,DELAYED RELEASE
Freq: Three times a day (TID) | ORAL | 3 refills | 90 days | Status: CP
Start: 2019-11-23 — End: ?

## 2019-11-23 NOTE — Unmapped (Signed)
Hi,    Patient Joseph Phillips called requesting a medication refill for the following:    ? Medication: Oxycodone  ? Dosage: 5mg   ? Days left of medication: 0  ? [facility]acy: [facility]         The expected turnaround time is 3-4 business days       Check Indicates criteria has been reviewed and confirmed with the patient:    [x]  Preferred Name   [x]  DOB and/or MR#  [x]  Preferred Contact Method  [x]  Phone Number(s)   [x]  Preferred Pharmacy   []  MyChart     Thank you,  Jacques Navy  [facility]  [phone]

## 2019-11-24 NOTE — Unmapped (Signed)
As directed by Dr. Claude Manges, I have contacted the patient and left a message for him to return my call to address the following:    ?? Dr. Claude Manges is in agreement with refilling the oxycodone, though, will not do so until 12/10/2019.    Upon the patient returning my call, an addendum admitted this encounter to reflect the information listed above is been reviewed and any additional questions or concerns have been answered.    No other actions taken and Dr. Claude Manges is aware.

## 2019-11-25 MED FILL — XTANDI 40 MG CAPSULE: ORAL | 30 days supply | Qty: 120 | Fill #5

## 2019-11-25 MED FILL — XTANDI 40 MG CAPSULE: 30 days supply | Qty: 120 | Fill #5 | Status: AC

## 2019-11-26 ENCOUNTER — Ambulatory Visit
Admit: 2019-11-26 | Discharge: 2019-11-26 | Disposition: A | Discharge status: Home / Self Care | Payer: MEDICAID | Attending: Family

## 2019-11-26 ENCOUNTER — Emergency Department
Admit: 2019-11-26 | Discharge: 2019-11-26 | Disposition: A | Discharge status: Home / Self Care | Payer: MEDICAID | Attending: Family

## 2019-11-26 DIAGNOSIS — R0789 Other chest pain: Principal | ICD-10-CM

## 2019-11-26 LAB — COMPREHENSIVE METABOLIC PANEL
ALBUMIN: 3.9 g/dL (ref 3.5–5.0)
ALT (SGPT): 12 U/L (ref <50)
ANION GAP: 10 mmol/L (ref 7–15)
BILIRUBIN TOTAL: 0.9 mg/dL (ref 0.0–1.2)
BLOOD UREA NITROGEN: 20 mg/dL (ref 7–21)
CALCIUM: 8 mg/dL — ABNORMAL LOW (ref 8.5–10.2)
CHLORIDE: 113 mmol/L — ABNORMAL HIGH (ref 98–107)
CO2: 17 mmol/L — ABNORMAL LOW (ref 22.0–30.0)
CREATININE: 0.69 mg/dL — ABNORMAL LOW (ref 0.70–1.30)
EGFR CKD-EPI AA MALE: >90 mL/min/{1.73_m2} (ref >=60)
EGFR CKD-EPI NON-AA MALE: >90 mL/min/{1.73_m2} (ref >=60)
GLUCOSE RANDOM: 109 mg/dL (ref 70–179)
POTASSIUM: 5.4 mmol/L — ABNORMAL HIGH (ref 3.5–5.0)

## 2019-11-26 LAB — CBC W/ AUTO DIFF
EOSINOPHILS ABSOLUTE COUNT: 0.1 10*9/L (ref 0.0–0.7)
EOSINOPHILS RELATIVE PERCENT: 1 %
HEMATOCRIT: 27.4 % — ABNORMAL LOW (ref 38.0–50.0)
HEMOGLOBIN: 9.4 g/dL — ABNORMAL LOW (ref 13.5–17.5)
LYMPHOCYTES ABSOLUTE COUNT: 1.6 10*9/L (ref 0.7–4.0)
LYMPHOCYTES RELATIVE PERCENT: 16.5 %
MEAN CORPUSCULAR HEMOGLOBIN CONC: 34.4 g/dL (ref 30.0–36.0)
MEAN CORPUSCULAR HEMOGLOBIN: 31.2 pg (ref 26.0–34.0)
MEAN CORPUSCULAR VOLUME: 90.8 fL (ref 81.0–95.0)
MEAN PLATELET VOLUME: 7.8 fL (ref 7.0–10.0)
MONOCYTES ABSOLUTE COUNT: 1 10*9/L (ref 0.1–1.0)
MONOCYTES RELATIVE PERCENT: 9.9 %
NEUTROPHILS RELATIVE PERCENT: 71.6 %
PLATELET COUNT: 257 10*9/L (ref 150–450)
RED BLOOD CELL COUNT: 3.02 10*12/L — ABNORMAL LOW (ref 4.32–5.72)
RED CELL DISTRIBUTION WIDTH: 14.2 % (ref 12.0–15.0)

## 2019-11-26 LAB — URINALYSIS WITH CULTURE REFLEX
BLOOD UA: NEGATIVE
GLUCOSE UA: NEGATIVE
KETONES UA: NEGATIVE
LEUKOCYTE ESTERASE UA: NEGATIVE
NITRITE UA: NEGATIVE
PH UA: 6 (ref 5.0–9.0)
PROTEIN UA: NEGATIVE
RBC UA: <1 /HPF (ref <3)
SPECIFIC GRAVITY UA: 1.02 (ref 1.005–1.040)
SQUAMOUS EPITHELIAL: >8 /HPF — ABNORMAL HIGH (ref 0–5)
UROBILINOGEN UA: 1
WBC UA: >10 /HPF — ABNORMAL HIGH (ref <2)

## 2019-11-26 LAB — TROPONIN I: Troponin I.cardiac:MCnc:Pt:Ser/Plas:Qn:: <0.06

## 2019-11-26 LAB — RED CELL DISTRIBUTION WIDTH: Lab: 14.2

## 2019-11-26 LAB — CO2: Carbon dioxide:SCnc:Pt:Ser/Plas:Qn:: 17 — ABNORMAL LOW

## 2019-11-26 LAB — RBC UA: Erythrocytes:Naric:Pt:Urine sed:Qn:Microscopy.light.HPF: <1

## 2019-11-26 MED ADMIN — oxyCODONE-acetaminophen (PERCOCET) 5-325 mg tablet 1 tablet: 1 | ORAL | @ 17:00:00 | Stop: 2019-11-26

## 2019-11-26 NOTE — Unmapped (Signed)
Bed: 08  Expected date:   Expected time:   Means of arrival:   Comments:  Hold maybe ems here

## 2019-11-26 NOTE — Unmapped (Signed)
Pt stating he has a hx of having this chest pain. Pt is being currently treated for prostate cancer. Pt stating anterior left CP but denies SOB. Pt has had this occur every once in awhile in the past per EMS. Pt took 325mg  ASA PTA. Pt also takes oxy and other meds or pain.

## 2019-11-26 NOTE — Unmapped (Signed)
[facility]  Emergency Department Provider Note      ED Clinical Impression     Final diagnoses:   Atypical chest pain (Primary)       Initial Impression, ED Course, Assessment and Plan     Impression: Joseph Phillips is a 58 y.o. male with a history of stage IV prostate cancer s/p prostatectomy (2006) and radiation therapy (currently on [place]), chronic pancreatitis, COPD, polysubstance abuse (EtOH, tobacco use, and opiates occasionally unprescribed), hypertension, seizures, herpes zoster involving the sacral dermatome, and depression presenting to the ED today for 2 days of intermittent sharp 8/10 L anterior chest pain with some radiation to the back.    On exam, the patient is alert, oriented, and in NAD. Vitals are WNL. Exam is reassuring.     Differential includes less likely ACS versus acute on chronic chest pain versus less likely versus infectious process    Plan for CXR, EKG, troponin, UA, and basic labs. Will give Percocet.    1348 : On re-evaluation the patient feels much improved and is ready for discharge. Labs unremarkable compared to previous studies and reassuring.Patient informed to continue taking previously prescribed medications. Strict return precautions have been discussed and all questions have been appropriately answered. Patient is understanding and agreeable with plan.    Additional Medical Decision Making     I independently visualized the EKG tracing.   I independently visualized the radiology images.   I reviewed the patient's prior medical records.     Labs and radiology results that were available during my care of the patient were independently reviewed by me and considered in my medical decision making.    Portions of this record have been created using Scientist, clinical (histocompatibility and immunogenetics). Dictation errors have been sought, but may not have been identified and corrected.  ____________________________________________ I have reviewed the triage vital signs and the nursing notes.     History     Chief Complaint  Chest Pain      HPI   Joseph Phillips is a 58 y.o. male with a PMH of stage IV prostate cancer s/p prostatectomy (2006) and radiation therapy (currently on [place]), chronic pancreatitis, COPD, polysubstance abuse (EtOH, tobacco use, and opiates occasionally unprescribed), hypertension, seizures, herpes zoster involving the sacral dermatome, and depression BIB EMS to the ED today for evaluation of chest and back pain. The patient states he has been having intermittent sharp 8/10 L anterior chest pain for the past 2 days. This is in the setting of decreasing his dose of oxycodone to make it last before his next refill on 12/10/2019. He does not know of any relieving factors but states his pain worsens with deep inspiration. The patient is currently wearing a fentanyl patch. No shortness of breath, nausea, vomiting, diarrhea, or fever.       Past Medical History:   Diagnosis Date   ??? Alcohol abuse    ??? COPD (chronic obstructive pulmonary disease) (CMS-HCC)    ??? Hx of radiation therapy    ??? Hypertension    ??? Polysubstance abuse (CMS-HCC)    ??? Prostate cancer (CMS-HCC)    ??? Seizures (CMS-HCC)        Patient Active Problem List   Diagnosis   ??? 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 Acute pancreatitis   ??? Depressive disorder   ??? Reflux esophagitis   ??? Encounter for long-term (current) use of other medications   ??? Pain medication agreement  broken   ??? Do not give narcotics   ??? PPD screening test   ??? Tobacco use disorder   ??? Prostate cancer (CMS-HCC)   ??? Hx of radiation therapy   ??? Healthcare maintenance   ??? Sleep difficulties   ??? Essential hypertension   ??? Anxiety   ??? Hypokalemia   ??? Hypomagnesemia   ??? Herpes zoster involving sacral dermatome   ??? Stomatitis and mucositis   ??? Odynophagia   ??? Mucositis due to chemotherapy   ??? Chest pain, mid sternal   ??? Acute midline thoracic back pain   ??? Hyperkalemia ??? COPD (chronic obstructive pulmonary disease) (CMS-HCC)   ??? Chronic pancreatitis (CMS-HCC)   ??? 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 Cough   ??? Fever and chills   ??? Acute alcoholic gastritis without hemorrhage   ??? Alcohol withdrawal syndrome without complication (CMS-HCC)       Past Surgical History:   Procedure Laterality Date   ??? IR INSERT PORT AGE GREATER THAN 5 YRS  07/15/2018    IR INSERT PORT AGE GREATER THAN 5 YRS 07/15/2018 Carolin Coy, MD IMG [facility]   ??? PR COLSC FLX W/RMVL OF TUMOR POLYP LESION SNARE TQ N/A 08/16/2015    Procedure: COLONOSCOPY FLEX; W/REMOV TUMOR/LES BY SNARE;  Surgeon: Gwen Pounds, MD;  Location: [facility];  Service: Gastroenterology   ??? PR COLSC FLX WITH DIRECTED SUBMUCOSAL NJX ANY SBST N/A 08/16/2015    Procedure: COLONOSCOPY, FLEXIBLE, PROXIMAL TO SPLENIC FLEXURE; WITH DIRECTED SUBMUCOSAL INJECTION(S), ANY SUBSTANCE;  Surgeon: Gwen Pounds, MD;  Location: [facility];  Service: Gastroenterology   ??? PROSTATE SURGERY         No current facility-administered medications for this encounter.     Current Outpatient Medications:   ???  albuterol HFA 90 mcg/actuation inhaler, INHALE 2 PUFFS EVERY 6 HOURS AS NEEDED FOR WHEEZING, Disp: 8.5 g, Rfl: 0  ???  amLODIPine (NORVASC) 10 MG tablet, Take 1 tablet (10 mg total) by mouth daily., Disp: 30 tablet, Rfl: 11  ???  busPIRone (BUSPAR) 5 MG tablet, TAKE 1 TABLET BY MOUTH THREE TIMES A DAY, Disp: 270 tablet, Rfl: 3  ???  calcium carbonate 650 mg calcium (1,625 mg) tablet, Take 1 tablet (650 mg of elem calcium total) by mouth daily., Disp: 30 tablet, Rfl: 11  ???  diclofenac sodium (VOLTAREN) 1 % gel, Apply 2 g topically Four (4) times a day. Any area of pain, Disp: 200 g, Rfl: 5  ???  diphenhydrAMINE (BENADRYL) 25 mg tablet, Take 0.5 tablets (12.5 mg total) by mouth every six (6) hours as needed for itching., Disp: 30 capsule, Rfl: 0 ???  diphenhydramine HCl (MAGIC MOUTHWASH ORAL) suspension, Take 10 mL by mouth 4 (four) times a day as needed., Disp: 120 mL, Rfl: 1  ???  enzalutamide (XTANDI) 40 mg capsule, Take 4 capsules (160 mg total) by mouth daily., Disp: 120 capsule, Rfl: 5  ???  famotidine (PEPCID) 20 MG tablet, Take 1 tablet (20 mg total) by mouth two (2) times a day as needed for heartburn., Disp: 60 tablet, Rfl: 0  ???  fentaNYL (DURAGESIC) 25 mcg/hr patch, Place 1 patch on the skin every third day., Disp: 10 patch, Rfl: 0  ???  megestroL (MEGACE) 400 mg/10 mL (40 mg/mL) suspension, Take 10 mL (400 mg total) by mouth Two (2) times a day., Disp: 480 mL, Rfl: 12  ???  melatonin 3 mg Tab, TK  1 T PO HS PRN, Disp: , Rfl:   ???  MULTIVITAMIN 50 PLUS Tab, TK 1 T PO D, Disp: , Rfl:   ???  nicotine (NICODERM CQ) 14 mg/24 hr patch, Place 1 patch on the skin daily., Disp: 28 patch, Rfl: 0  ???  oxyCODONE (ROXICODONE) 5 MG immediate release tablet, Take 2 tablets (10 mg total) by mouth every four (4) hours as needed for pain., Disp: 100 tablet, Rfl: 0  ???  pancrelipase, Lip-Prot-Amyl, (CREON) 24,000-76,000 -120,000 unit CpDR delayed release capsule, Take 2 capsules (48,000 units of lipase total) by mouth Three (3) times a day with a meal. Take an additional 1 capsule with snacks., Disp: 540 each, Rfl: 3  ???  prochlorperazine (COMPAZINE) 10 MG tablet, TAKE 1 TABLET(10 MG) BY MOUTH EVERY 8 HOURS AS NEEDED, Disp: 30 tablet, Rfl: 1  ???  salmeteroL (SEREVENT) 50 mcg/dose diskus inhaler, Inhale 1 puff Two (2) times a day., Disp: 1 Inhaler, Rfl: 12  ???  tiotropium (SPIRIVA WITH HANDIHALER) 18 mcg inhalation capsule, Place 1 capsule (18 mcg total) into inhaler and inhale once daily., Disp: 90 capsule, Rfl: 3  ???  traZODone (DESYREL) 100 MG tablet, Take 1 tablet (100 mg total) by mouth nightly as needed for sleep., Disp: 90 tablet, Rfl: 3  ???  valACYclovir (VALTREX) 500 MG tablet, Take 1 tablet (500 mg total) by mouth daily., Disp: 90 tablet, Rfl: 3    Allergies Penicillins and Tramadol    Family History   Problem Relation Age of Onset   ??? Heart disease Mother    ??? Diabetes Mother    ??? Diabetes Father    ??? Melanoma Neg Hx    ??? Basal cell carcinoma Neg Hx    ??? Squamous cell carcinoma Neg Hx        Social History  Social History     Tobacco Use   ??? Smoking status: Current Every Day Smoker     Packs/day: 0.50     Years: 40.00     Pack years: 20.00     Types: Cigarettes     Start date: 05/09/1994   ??? Smokeless tobacco: Never Used   Substance Use Topics   ??? Alcohol use: Yes     Comment: Pt last drank 6 months   ??? Drug use: Yes     Types: Marijuana       Review of Systems   Constitutional: Negative for fever.  Ears:Negative for pain or ear pulling.   Eyes: Negative for visual changes.  ENT: Negative for sore throat.  Cardiovascular: Positive for chest pain.  Respiratory: Negative for shortness of breath.  Gastrointestinal: Negative for abdominal pain, vomiting or diarrhea.  Genitourinary: Negative for dysuria.  Musculoskeletal: Positive for back pain.  Skin: Negative for rash.  Neurological: Negative for headaches, focal weakness or numbness.      Physical Exam     Vitals:    11/26/19 1107 11/26/19 1424   BP: 114/74 117/71   Pulse: 82 81   Resp: 16 19   Temp: 37 ??C (98.6 ??F)    TempSrc: Oral    SpO2: 100% 100%     Labs Reviewed   COMPREHENSIVE METABOLIC PANEL - Abnormal; Notable for the following components:       Result Value    Potassium 5.4 (*)     Chloride 113 (*)     CO2 17.0 (*)     Creatinine 0.69 (*)     Calcium 8.0 (*)  All other components within normal limits   URINALYSIS WITH CULTURE REFLEX - Abnormal; Notable for the following components:    Bilirubin, UA Small (*)     WBC, UA >10 (*)     Squam Epithel, UA >8 (*)     Bacteria, UA Many (*)     All other components within normal limits   CBC W/ AUTO DIFF - Abnormal; Notable for the following components:    RBC 3.02 (*)     HGB 9.4 (*)     HCT 27.4 (*)     All other components within normal limits URINE CULTURE - Normal    Narrative:     Specimen Source: Clean Catch   TROPONIN I - Normal   CBC W/ DIFFERENTIAL    Narrative:     The following orders were created for panel order CBC w/ Differential.  Procedure                               Abnormality         Status                     ---------                               -----------         ------                     CBC w/ Differential[[id number]]         Abnormal            Final result                 Please view results for these tests on the individual orders.         Constitutional: Alert and oriented. Well appearing and in no distress.  Eyes: Conjunctivae are normal. PERRLA intact. EOM wnl. No drainage noted.        Head: Normocephalic and atraumatic.       Nose: No congestion.       Mouth/Throat: Mucous membranes are moist.       Neck: No stridor.  Hematological/Lymphatic/Immunilogical: No cervical lymphadenopathy.  Cardiovascular: Normal rate, regular rhythm. Normal and symmetric distal pulses are present in all extremities.  Respiratory: Normal respiratory effort. Breath sounds are normal.  Gastrointestinal: Soft and nontender. There is no CVA tenderness.  Genitourinary: Deferred  Musculoskeletal: Normal range of motion in all extremities.       Right lower leg: No tenderness or edema.       Left lower leg: No tenderness or edema.  Neurologic: Normal speech and language. No gross focal neurologic deficits are appreciated.  Skin: Skin is warm, dry and intact. No rash noted.  Psychiatric: Mood and affect are normal. Speech and behavior are normal.    EKG     NORMAL SINUS RHYTHM  NORMAL ECG  WHEN COMPARED WITH ECG OF 02-Sep-2019 13:08,  NO SIGNIFICANT CHANGE WAS FOUND    Radiology     Final result by Pearson Grippe, MD (11/26/19 12:35:41)                Impression:      Clear lungs.             Narrative:    EXAM: XR CHEST 2 VIEWS   DATE: 11/26/2019  12:24 PM   ACCESSION: [id number]   DICTATED: 11/26/2019 12:34 PM INTERPRETATION LOCATION: [facility]     CLINICAL INDICATION: 58 years old Male with CHEST PAIN ??     COMPARISON: 10/06/2019     TECHNIQUE: PA and Lateral Chest Radiographs.     FINDINGS: Right chest wall port with tip in the distal SVC.     Radiographically clear lungs.     No pleural effusion or pneumothorax.     Unremarkable cardiomediastinal silhouette.                   I independently visualized these images.    Procedures     Procedure(s) performed: None.    Documentation assistance was provided by Jobie Quaker, Scribe, on November 26, 2019 at 11:25 AM for Orion Crook, FNP.      November 27, 2019 10:07 PM. Documentation assistance provided by the scribe. I was present during the time the encounter was recorded. The information recorded by the scribe was done at my direction and has been reviewed and validated by me.        Orion Crook, FNP  11/27/19 2207

## 2019-11-26 NOTE — Unmapped (Signed)
I have contacted the patient's brother, [name] and reviewed with Dr. Leota Jacobsen refill parameters for the oxycodone prescription as highlighted in the prior note associated with this encounter.    ?? In addition, Wilburn Mylar is presently being evaluated in the emergency department at the [facility] for chest pain.    This update information has been shared with Dr. Claude Manges.    Otherwise, the patient's brother expressed understanding and had no other questions or concerns at this time is agreeable to the plan outlined above.  They are to call the clinic back sooner should there be any other issues.

## 2019-11-28 NOTE — Unmapped (Signed)
Initial Visit Note    Patient Name: Joseph Phillips  Patient Age: 58 y.o.  Encounter Date: 12/01/2019    Referring Physician:     Unknown Foley, MD  [address]  [place],  [place] [address]    Primary Care Provider:  Unknown Foley, MD    Assessment/Plan:    Reason for visit:  Prostate Cancer    Cancer Staging  Prostate cancer (CMS-HCC)  Staging form: Prostate, AJCC 7th Edition  - Clinical: Stage IV (T3a, N0, M1b, PSA: Less than 10, Gleason 7) - Unsigned      --Prostate cancer, stage IV,      -- with progression on Lupron/Zytiga     --07/14/18: Docetaxel/prednisone     --11/11/2018: Neutropenic fever, with possible UTI and oral mucositis     -- 02/03/19: [name];  Held 03/01/19 for LE swelling/cellulitis; restart 03/29/19 at 80 mg qD     -- 08/16/19:  [name] increased to 160 mg daily  --Hypokalemia  --Hypomagnesemia  -- Alcoholism      -- 06/22/19: Admitted for alcoholic gastritis  --Leg cramps and pain, exacerbated by electrolyte disturbances      --03/01/19: Skin changes in the lower extremities of uncertain etiology.  --Teeth in poor repair, awaiting dental correction  --Hypertension  --Lower extremity swelling, exacerbated at night; improved on triamterene-HCTZ  -- OSA; positive study for both central and obstructive apnea; CPAP trial recommended, 07/23/2018; deferred by the patient  --History of pancreatitis with pancreatic insufficiency; on Creon; 2/2 EtOHism      Recommendation  --Patient appears to be relatively stable clinically, with chronic lower back pain, mild tenderness, secondary to prostate cancer.  --Some the patient's pain may be coming from constipation.  He will add MiraLAX and Senokot; I have asked him to contact me if these are not effective  --Patient has mild???moderate anemia, with recent normal iron studies, presumed secondary to advanced prostate cancer; stable  --Patient will continue on Xtandi, Megace (for appetite), trazodone, fentanyl and oxycodone. --It is hoped that if he can get back on Lupron and Xgeva on a regular basis, to see if this improves disease control.  ALP is relatively stable today; PSA pending.      Patient instructions  --Continue taking Xtandi (enzalutamide) 4 capsules daily.  --Continue taking Megace 2 teaspoons twice daily with meals  --Continue fentanyl 25 mcg patch every 3 days.--  --Continue oxycodone 5 mg, 1 or 2 tablets every 4-6hours as needed for pain; if needed, may increase to 3 tablets from time to time.  --Continue trazodone 100 mg at bedtime.  --Start taking MiraLAX 17 g (1 capful) daily and a large glass of liquids such as water.  --Take 1 or 2 tablets of Senokot, 1 or 2 times a day, as needed to ensure that you have at least one good bowel movement daily.  --We will plan to see you back in 1 month for follow-up.            I have reviewed the laboratory, pathology, and radiology reports in detail and discussed findings with the patient    History of Present Illness: Joseph Phillips is a 58 y.o. male who is seen in consultation at the request of Unknown Foley, MD for an evaluation of Prostate Cancer.  His last clinic visit, the patient has had some increased pain, it was first on the right flank region, but is now more in the left flank region, radiating to the front of the abdomen.  The pain is partially relieved by passing stool, although he has not had a bowel movement for several days.  Because of increased pain, he is run through his oxycodone a bit quicker than usual.  He is still having hot flashes.  He is about a week past his due date for Lupron/Xgeva.  He is taking Xtandi 160 mg every day; he does not feel he is missed any significant dosing.  He continues on Megace, and feels his appetite is adequate although he is continue to lose some weight.  He denies other pains, shortness of breath, headaches, visual changes, numbness or tingling in the hands or feet, difficulty passing urine, skin rash, swollen glands.  He has had congestion and a cough for several days but no fever.  He does not believe he has been exposed to anyone with the COVID-19 virus.        Oncology History: Patient was originally diagnosed with prostate cancer in 2006.  He underwent robot-assisted prostatectomy.  He did well until May 2018, when his PSA rose to a high of about 45, and he was started on Lupron, initially with Casodex, and then with apparatus.  His PSA fell and remained low until recently when he was noted to be rising from a low of 0.82 on  01/01/18, to 3.99 on 06/29/2018.  He was instructed to hold Zytiga.  Since holding Zytiga he is noted swelling in his ankles.  The swelling comes on overnight, improves during the course the day.  He was evaluated with Doppler studies on 06/29/2018 which showed no evidence of blood clots.  He is still bothered by the swelling.  Restaging studies done on 06/04/2018 including bone scan, and CT of the chest abdomen pelvis, showed sclerotic changes throughout the axial skeleton, and involvement also of scapula, some ribs and pelvis.  The sites were new since 2016, and also probably new since the CT stone study in May 2018.  Bone scan also showed some progression in disease by report, compared to 2018.  He has developed pain in the back of his calves, for which he takes oxycodone (Tylenol and ibuprofen were ineffective).  The pain followed prolonged episodes of cramping, felt to be related to low magnesium and potassium.  About 6 months ago he had shingles in a left L2 distribution.        Oncology History   Prostate cancer (CMS-HCC)   12/02/2008 Biopsy    Prostate, left apex, needle biopsy  - Adenocarcinoma, Gleason combined score 6 (3+3), 2 of 2 cores involved, tumor  approximately 2 mm in greatest diameter, approximately 10% of tissue involved by  carcinoma.  - See comment    E: ??Prostate, left mid, needle biopsy  - Adenocarcinoma, Gleason combined score 6 (3+3), 1 of 2 cores involved, tumor  approximately 2 mm in greatest diameter, approximately 5% of tissue involved by  carcinoma.  - See comment    F: ??Prostate, left base, needle biopsy - Adenocarcinoma, Gleason combined score 6 (3+3), 1 of 2 cores involved, tumor  approximately 1 mm in greatest diameter, approximately 5% of tissue involved by  carcinoma.     01/24/2009 Surgery    Prostate, robot assisted laparoscopic prostatectomy  Tumor histologic type: Prostatic adenocarcinoma  Tumor grade (Gleason system): Gleason pattern 7 (3+4)  Approximate percentage of prostate involved by tumor: 40%  Extent of tumor:  Location of tumor involvement in prostate: right and left lobes, anterior  and posterior, apex to base  Confinement/non-confinement of tumor  within the prostatic capsule: ??not  confined  Location of extracapsular tumor involvement: ??left prostate toward the base  (ie. block A22)  Type of extracapsular tissue involved within tumor: ?? adipose  tissue/perineural tissue  Angiolymphatic space invasion: not identified  Seminal vesicles: negative  Vas deferens: negative  Histologic assessment of surgical margins: positive margin along a 3 mm  face in the left prostate base (block A25)    Other significant findings: High grade prostatic intraepithelial  Neoplasm;  Lymph nodes: Not applicable  AJCC Staging: pT3a pNx     08/30/2013 Initial Diagnosis    Prostate cancer (CMS-HCC)     04/30/2017 -  Chemotherapy    Lupron 22.5 mg; with bicalutamide x  month     08/01/2017 - 06/30/2018 Chemotherapy    Abiaterone 1000 mg daily; decrease to 750 mg daily at 10/2018;     10/30/2017 -  Cancer Staged    Starta: TP53, TMPSSR  -EGR fusion; no other abnormalities identified     01/14/2018 -  Cancer Staged    CT Chest:  Numerous sclerotic metastases of the thoracic spine, left scapula  and multiple ribs.  3. Coronary artery and aortic atherosclerosis (ICD10-I70.0).  4. Previously demonstrated left renal mass is not included in the  field of view.     06/04/2018 -  Cancer Staged CT CAP:  Scattered diffuse sclerotic metastatic lesions are seen throughout the spine as well as the left scapula; some of the lesions are new since prior CT dated 04/20/2015.  - No intrathoracic metastatic disease.  Interval progression of osseous metastases throughout the spine and pelvis compared to 04/22/2017.  -- Cholelithiasis.  -- Hepatic steatosis.     06/04/2018 -  Cancer Staged    Bone scan:  Increased radiotracer uptake throughout the spine, sacrum, and left ilium, consistent with numerous metastases better evaluated on same-day CT. Unchanged metastasis to the left proximal humerus.     07/14/2018 - 11/12/2018 Chemotherapy    OP PROSTATE DOCETAXEL/PREDNISONE  Docetaxel 75 mg/m2 evry 21 days     01/07/2019 -  Cancer Staged    CTA Chest:  No evidence of pulmonary embolism.  2. Occlusion of the medial basilar segmental bronchus of the left  lower lobe, nonspecific though could be seen in the setting of  aspiration. Otherwise, no acute findings.  3. Apical predominant paraseptal emphysematous change with suspected  mild progression of smoking related fibrosis with subpleural  reticulation but without evidence of honeycombing. Emphysema  (ICD10-J43.9).  4. Progression of osseous metastatic disease since 11/2017. Detailed  above. No definitive pathologic compression fractures.  5. Hepatic steatosis. 6. Coronary artery calcifications. Aortic Atherosclerosis     01/20/2019 -  Cancer Staged    NM Bone scan: Redemonstrated multiple abnormal foci throughout the appendicular axial skeleton, with interval increase in the extent of the T10 vertebral body lesion and new lesions involving the manubrium, left fifth and seventh ribs. This is most consistent with progression of osseous metastatic disease.     02/03/2019 -  Chemotherapy    Xtandi 160 mg qD; held after 1 month due to swelling.  Restart at 80 mg qD on 03/29/19     03/10/2019 -  Cancer Staged    CTA chest:  No evidence of acute aortic intramural hematoma or dissection. Multifocal osseous sclerotic metastatic disease, progressed when compared to prior CT 06/04/2018 and grossly similar to most recent nuclear medicine bone scan.  Mucoid impacted airways in left lower lobe with adjacent subtle ground glass opacities;  likely represents sequelae of aspiration. Recommend follow up CT in 6 to 8 weeks to ensure clearing.  Other chronic/incidental findings as above, not substantially changed.     09/02/2019 -  Cancer Staged    CTA Chest:  Interval increased sclerosis of diffuse osseous metastatic disease compared to 03/10/2019, likely reflects treatment related changes         09/07/2019 Endocrine/Hormone Therapy    OP LEUPROLIDE (7.5 MG EVERY MONTH)  Plan Provider: Towanda Malkin, MD     10/06/2019 -  Cancer Staged    CT AP:  Diffuse sclerotic metastases throughout the visualized skeleton, similar to prior exam.  No adenopathy. Prostate surgically absent         Past Medical History:   Diagnosis Date   ??? Alcohol abuse    ??? COPD (chronic obstructive pulmonary disease) (CMS-HCC)    ??? Hx of radiation therapy    ??? Hypertension    ??? Polysubstance abuse (CMS-HCC)    ??? Prostate cancer (CMS-HCC)    ??? Seizures (CMS-HCC)       Past Surgical History:   Procedure Laterality Date   ??? IR INSERT PORT AGE GREATER THAN 5 YRS  07/15/2018    IR INSERT PORT AGE GREATER THAN 5 YRS 07/15/2018 Carolin Coy, MD IMG [facility]   ??? PR COLSC FLX W/RMVL OF TUMOR POLYP LESION SNARE TQ N/A 08/16/2015    Procedure: COLONOSCOPY FLEX; W/REMOV TUMOR/LES BY SNARE;  Surgeon: Gwen Pounds, MD;  Location: [facility];  Service: Gastroenterology   ??? PR COLSC FLX WITH DIRECTED SUBMUCOSAL NJX ANY SBST N/A 08/16/2015    Procedure: COLONOSCOPY, FLEXIBLE, PROXIMAL TO SPLENIC FLEXURE; WITH DIRECTED SUBMUCOSAL INJECTION(S), ANY SUBSTANCE;  Surgeon: Gwen Pounds, MD;  Location: [facility];  Service: Gastroenterology   ??? PROSTATE SURGERY          Family History Problem Relation Age of Onset   ??? Heart disease Mother    ??? Diabetes Mother    ??? Diabetes Father    ??? Melanoma Neg Hx    ??? Basal cell carcinoma Neg Hx    ??? Squamous cell carcinoma Neg Hx       Family Status   Relation Name Status   ??? Mother  (Not Specified)   ??? Father  (Not Specified)   ??? Neg Hx  (Not Specified)     Social History     Occupational History   ??? Occupation: DISABLED   Tobacco Use   ??? Smoking status: Current Every Day Smoker     Packs/day: 0.50     Years: 40.00     Pack years: 20.00     Types: Cigarettes     Start date: 05/09/1994   ??? Smokeless tobacco: Never Used   Substance and Sexual Activity   ??? Alcohol use: Yes     Comment: Pt last drank 6 months   ??? Drug use: Yes     Types: Marijuana   ??? Sexual activity: Not Currently       Allergies   Allergen Reactions   ??? Penicillins      Other reaction(s): UNKNOWN   ??? Tramadol Dizziness       Current Outpatient Medications   Medication Sig Dispense Refill   ??? albuterol HFA 90 mcg/actuation inhaler INHALE 2 PUFFS EVERY 6 HOURS AS NEEDED FOR WHEEZING 8.5 g 0   ??? amLODIPine (NORVASC) 10 MG tablet Take 1 tablet (10 mg total) by mouth daily.  30 tablet 11   ??? busPIRone (BUSPAR) 5 MG tablet TAKE 1 TABLET BY MOUTH THREE TIMES A DAY 270 tablet 3   ??? calcium carbonate 650 mg calcium (1,625 mg) tablet Take 1 tablet (650 mg of elem calcium total) by mouth daily. 30 tablet 11   ??? diclofenac sodium (VOLTAREN) 1 % gel Apply 2 g topically Four (4) times a day. Any area of pain 200 g 5   ??? diphenhydrAMINE (BENADRYL) 25 mg tablet Take 0.5 tablets (12.5 mg total) by mouth every six (6) hours as needed for itching. 30 capsule 0   ??? diphenhydramine HCl (MAGIC MOUTHWASH ORAL) suspension Take 10 mL by mouth 4 (four) times a day as needed. 120 mL 1   ??? enzalutamide (XTANDI) 40 mg capsule Take 4 capsules (160 mg total) by mouth daily. 120 capsule 5   ??? fentaNYL (DURAGESIC) 25 mcg/hr patch Place 1 patch on the skin every third day. 10 patch 0 ??? megestroL (MEGACE) 400 mg/10 mL (40 mg/mL) suspension Take 10 mL (400 mg total) by mouth Two (2) times a day. 480 mL 12   ??? melatonin 3 mg Tab TK 1 T PO HS PRN     ??? MULTIVITAMIN 50 PLUS Tab TK 1 T PO D     ??? nicotine (NICODERM CQ) 14 mg/24 hr patch Place 1 patch on the skin daily. 28 patch 0   ??? omeprazole (PRILOSEC) 20 MG capsule Take 2 capsules by mouth Two (2) times a day.     ??? oxyCODONE (ROXICODONE) 5 MG immediate release tablet Take 2 tablets (10 mg total) by mouth every four (4) hours as needed for pain. May increase to 3 tablets as needed. 120 tablet 0   ??? pancrelipase, Lip-Prot-Amyl, (CREON) 24,000-76,000 -120,000 unit CpDR delayed release capsule Take 2 capsules (48,000 units of lipase total) by mouth Three (3) times a day with a meal. Take an additional 1 capsule with snacks. 540 each 3   ??? prochlorperazine (COMPAZINE) 10 MG tablet TAKE 1 TABLET(10 MG) BY MOUTH EVERY 8 HOURS AS NEEDED 30 tablet 1   ??? salmeteroL (SEREVENT) 50 mcg/dose diskus inhaler Inhale 1 puff Two (2) times a day. 1 Inhaler 12   ??? tiotropium (SPIRIVA WITH HANDIHALER) 18 mcg inhalation capsule Place 1 capsule (18 mcg total) into inhaler and inhale once daily. 90 capsule 3   ??? valACYclovir (VALTREX) 500 MG tablet Take 1 tablet (500 mg total) by mouth daily. 90 tablet 3   ??? famotidine (PEPCID) 20 MG tablet Take 1 tablet (20 mg total) by mouth two (2) times a day as needed for heartburn. 60 tablet 0   ??? polyethylene glycol (MIRALAX) 17 gram/dose powder Take 17 g by mouth daily. 595 g 11   ??? senna (SENOKOT) 8.6 mg tablet Take 2 tablets by mouth Two (2) times a day. As neeeded to have at least 1 BM daily 60 tablet 2   ??? traZODone (DESYREL) 100 MG tablet Take 1 tablet (100 mg total) by mouth nightly as needed for sleep. 90 tablet 3     No current facility-administered medications for this visit.      Facility-Administered Medications Ordered in Other Visits   Medication Dose Route Frequency Provider Last Rate Last Dose ??? denosumab (XGEVA) injection 120 mg  120 mg Subcutaneous Once Towanda Malkin, MD       ??? heparin, porcine (PF) 100 unit/mL injection 500 Units  500 Units Intravenous Q30 Min PRN Towanda Malkin, MD  500 Units at 12/01/19 1610   ??? leuprolide (LUPRON) injection 7.5 mg  7.5 mg Intramuscular Once Towanda Malkin, MD           The following portions of the patient's history were reviewed and updated as appropriate: allergies, current medications, past family history, past medical history, past social history, past surgical history and problem list.     Review of Systems   Constitutional: Positive for fatigue. Negative for appetite change, chills, diaphoresis, fever and unexpected weight change.   HENT:   Negative for mouth sores, nosebleeds and trouble swallowing.    Eyes: Negative for eye problems.   Respiratory: Negative for chest tightness, cough, hemoptysis and shortness of breath.    Cardiovascular: Negative for chest pain and leg swelling.   Gastrointestinal: Positive for abdominal pain, diarrhea, nausea and vomiting. Negative for blood in stool and constipation.   Genitourinary: Negative for difficulty urinating and hematuria.    Musculoskeletal: Positive for back pain and myalgias. Negative for arthralgias.   Skin: Negative for rash.   Neurological: Negative for extremity weakness, headaches and seizures.   Hematological: Negative for adenopathy.   Psychiatric/Behavioral: Negative for confusion.   All other systems reviewed and are negative.        Vital Signs for this encounter:    BSA: 1.8 meters squared  BP 134/72  - Pulse 80  - Temp 36.7 ??C (98.1 ??F) (Temporal)  - Resp 18  - Ht 174 cm (5' 8.5)  - SpO2 100%  - BMI 22.10 kg/m??          Physical Exam   Constitutional: He is oriented to person, place, and time.   HENT:   Head: Normocephalic.   Eyes: Pupils are equal, round, and reactive to light. EOM are normal. Cardiovascular: Normal rate, regular rhythm and normal heart sounds.   Pulmonary/Chest: Effort normal and breath sounds normal.   Abdominal: Soft. Bowel sounds are normal. He exhibits no distension and no mass.   Mild tenderness over bowel and the left upper and lower quadrants.   Musculoskeletal:         General: No tenderness.      Comments: Tenderness to palpation over the lower thoracic and lumbar spines, and to a lesser degree, elsewhere in the back.   Lymphadenopathy:     He has no cervical adenopathy.   No adenopathy neck, supraclavicular, axillary inguinal regions.   Neurological: He is alert and oriented to person, place, and time.   Skin: No rash noted.   Psychiatric: He has a normal mood and affect. His behavior is normal.       Karnofsky/Lansky Performance Status  80, Normal activity with effort; some signs or symptoms of disease (ECOG equivalent 1)    Results:    WBC   Date Value Ref Range Status   12/01/2019 8.4 3.5 - 10.5 10*9/L Final   04/17/2012 7.6 4.5 - 11.0 x10 9th/L Final     HGB   Date Value Ref Range Status   12/01/2019 9.5 (L) 13.5 - 17.5 g/dL Final   96/03/5408 81.1 13.5 - 17.5 G/DL Final     HCT   Date Value Ref Range Status   12/01/2019 27.6 (L) 38.0 - 50.0 % Final   04/17/2012 39.2 (L) 41.0 - 53.0 % Final     Platelet   Date Value Ref Range Status   12/01/2019 394 150 - 450 10*9/L Final   04/17/2012 242 150 - 440 x10 9th/L Final  LDH   Date Value Ref Range Status   05/27/2019 763 (H) 338 - 610 U/L Final     Creatinine Whole Blood, POC   Date Value Ref Range Status   07/25/2014 1.6 (H) 0.8 - 1.4 mg/dL Final     Creatinine, Whole Blood   Date Value Ref Range Status   07/25/2014 1.4 0.8 - 1.4 mg/dL Final     Creatinine   Date Value Ref Range Status   12/01/2019 0.71 0.70 - 1.30 mg/dL Final   16/09/9603 5.40 0.70 - 1.30 mg/dL Final     AST   Date Value Ref Range Status   11/26/2019   Final     Comment:     Hemolyzed Specimen   09/11/2011 38 19 - 55 U/L Final     PSA Diagnostic Date Value Ref Range Status   09/28/2014 4.4 (H) 0.0 - 4.0 ng/mL Final   05/30/2014 5.0 (H) 0.0 - 4.0 ng/mL Final   03/23/2014 4.6 (H) 0.0 - 4.0 ng/mL Final   10/04/2013 3.7 0.0 - 4.0 ng/mL Final   06/24/2013 2.6 0.0 - 4.0 ng/mL Final   01/18/2013 1.6 0.0 - 4.0 NG/ML Final   09/28/2012 1.5 0.0 - 4.0 NG/ML Final   07/03/2012 1.0 0.0 - 4.0 NG/ML Final   12/30/2011 0.5 0.0 - 4.0 NG/ML Final   07/15/2011 0.3 0.0 - 4.0 NG/ML Final   04/08/2011 0.3 0.0 - 4.0 NG/ML Final

## 2019-12-01 ENCOUNTER — Ambulatory Visit
Admit: 2019-12-01 | Discharge: 2019-12-02 | Payer: MEDICAID | Attending: Hematology & Oncology | Primary: Hematology & Oncology

## 2019-12-01 ENCOUNTER — Institutional Professional Consult (permissible substitution): Admit: 2019-12-01 | Discharge: 2019-12-02 | Payer: MEDICAID

## 2019-12-01 DIAGNOSIS — C61 Malignant neoplasm of prostate: Principal | ICD-10-CM

## 2019-12-01 DIAGNOSIS — E876 Hypokalemia: Principal | ICD-10-CM

## 2019-12-01 DIAGNOSIS — K5900 Constipation, unspecified: Principal | ICD-10-CM

## 2019-12-01 LAB — CBC W/ AUTO DIFF
BASOPHILS ABSOLUTE COUNT: 0.1 10*9/L (ref 0.0–0.1)
BASOPHILS RELATIVE PERCENT: 1 %
EOSINOPHILS ABSOLUTE COUNT: 0.2 10*9/L (ref 0.0–0.7)
EOSINOPHILS RELATIVE PERCENT: 2.7 %
HEMATOCRIT: 27.6 % — ABNORMAL LOW (ref 38.0–50.0)
HEMOGLOBIN: 9.5 g/dL — ABNORMAL LOW (ref 13.5–17.5)
LYMPHOCYTES ABSOLUTE COUNT: 2.1 10*9/L (ref 0.7–4.0)
MEAN CORPUSCULAR VOLUME: 89.6 fL (ref 81.0–95.0)
MEAN PLATELET VOLUME: 7 fL (ref 7.0–10.0)
MONOCYTES ABSOLUTE COUNT: 0.6 10*9/L (ref 0.1–1.0)
MONOCYTES RELATIVE PERCENT: 6.7 %
NEUTROPHILS ABSOLUTE COUNT: 5.4 10*9/L (ref 1.7–7.7)
NEUTROPHILS RELATIVE PERCENT: 64.4 %
PLATELET COUNT: 394 10*9/L (ref 150–450)
RED BLOOD CELL COUNT: 3.09 10*12/L — ABNORMAL LOW (ref 4.32–5.72)
RED CELL DISTRIBUTION WIDTH: 14.2 % (ref 12.0–15.0)
WBC ADJUSTED: 8.4 10*9/L (ref 3.5–10.5)

## 2019-12-01 LAB — FERRITIN: Ferritin:MCnc:Pt:Ser/Plas:Qn:: 525 — ABNORMAL HIGH

## 2019-12-01 LAB — PROSTATE SPECIFIC ANTIGEN: Prostate specific Ag:MCnc:Pt:Ser/Plas:Qn:: 228 — ABNORMAL HIGH

## 2019-12-01 LAB — CALCIUM: Calcium:MCnc:Pt:Ser/Plas:Qn:: 8.8

## 2019-12-01 LAB — EGFR CKD-EPI NON-AA MALE: Lab: >90

## 2019-12-01 LAB — CREATININE: EGFR CKD-EPI AA MALE: >90 mL/min/{1.73_m2} (ref >=60)

## 2019-12-01 LAB — IRON SATURATION (CALC): Iron saturation:MFr:Pt:Ser/Plas:Qn:: 17 — ABNORMAL LOW

## 2019-12-01 LAB — POTASSIUM: Potassium:SCnc:Pt:Ser/Plas:Qn:: 4.6

## 2019-12-01 LAB — IRON & TIBC
IRON SATURATION (CALC): 17 % — ABNORMAL LOW (ref 20–50)
IRON: 35 ug/dL (ref 35–165)

## 2019-12-01 LAB — VITAMIN B-12: Cobalamins:MCnc:Pt:Ser/Plas:Qn:: 927 — ABNORMAL HIGH

## 2019-12-01 LAB — ALKALINE PHOSPHATASE: Alkaline phosphatase:CCnc:Pt:Ser/Plas:Qn:: 316 — ABNORMAL HIGH

## 2019-12-01 LAB — ALBUMIN: Albumin:MCnc:Pt:Ser/Plas:Qn:: 3.9

## 2019-12-01 LAB — EOSINOPHILS ABSOLUTE COUNT: Eosinophils:NCnc:Pt:Bld:Qn:Automated count: 0.2

## 2019-12-01 LAB — PHOSPHORUS: Phosphate:MCnc:Pt:Ser/Plas:Qn:: 2.4 — ABNORMAL LOW

## 2019-12-01 MED ORDER — POLYETHYLENE GLYCOL 3350 17 GRAM/DOSE ORAL POWDER
Freq: Every day | ORAL | 11 refills | 35.00000 days | Status: CP
Start: 2019-12-01 — End: ?

## 2019-12-01 MED ORDER — OXYCODONE 5 MG TABLET
ORAL_TABLET | ORAL | 0 refills | 10 days | Status: CP | PRN
Start: 2019-12-01 — End: ?

## 2019-12-01 MED ORDER — SENNOSIDES 8.6 MG TABLET
ORAL_TABLET | Freq: Two times a day (BID) | ORAL | 2 refills | 15.00000 days | Status: CP
Start: 2019-12-01 — End: 2020-11-30

## 2019-12-01 MED ADMIN — leuprolide (LUPRON) injection 7.5 mg: 7.5 mg | INTRAMUSCULAR | @ 15:00:00 | Stop: 2019-12-01

## 2019-12-01 MED ADMIN — heparin, porcine (PF) 100 unit/mL injection 500 Units: 500 [IU] | INTRAVENOUS | @ 13:00:00 | Stop: 2019-12-01

## 2019-12-01 MED ADMIN — denosumab (XGEVA) injection 120 mg: 120 mg | SUBCUTANEOUS | @ 15:00:00 | Stop: 2019-12-01

## 2019-12-01 NOTE — Unmapped (Addendum)
C/o lower back pain and left flank pain under breast area. Pt was seen in ED on 12/4 for atypical chest pain. Pt states he only has two pain pills left to take.     Pt unable to lift left arm without having extreme pain    Inbasket message sent to [facility] schedulers for testing. Advised pt to wait until he rcvd a call from schedulers on a location where he could get tested closer to home. Pt stated his apt complex was having free testing and he would see if he could get tested there. Advised pt to self quarantine, pt verbalized understanding.

## 2019-12-01 NOTE — Unmapped (Addendum)
--  Continue taking Xtandi (enzalutamide) 4 capsules daily.  --Continue taking Megace 2 teaspoons twice daily with meals  --Continue fentanyl 25 mcg patch every 3 days.--  --Continue oxycodone 5 mg, 1 or 2 tablets every 4-6hours as needed for pain; if needed, may increase to 3 tablets from time to time.  --Continue trazodone 100 mg at bedtime.  --Start taking MiraLAX 17 g (1 capful) daily and a large glass of liquids such as water.  --Take 1 or 2 tablets of Senokot, 1 or 2 times a day, as needed to ensure that you have at least one good bowel movement daily.  --Contact me if these steps are not effective in getting your bowels to work.  You might also consider using a fleets or regular enema.  --We will plan to see you back in 1 month for follow-up.

## 2019-12-01 NOTE — Unmapped (Signed)
Patient called back in to set up Symptomatic Covid Appointment.  Call dropped, attempted to call patient back and it went to voicemail which he does not have set up.

## 2019-12-01 NOTE — Unmapped (Signed)
Pt's port accessed - flushed, bld return noted, labs drawn and heplocked. Port de-accessed. Pt tolerated well.

## 2019-12-01 NOTE — Unmapped (Signed)
Labs completed via port.    Ca 8.8 and Phosphorus 2.4. Notified Dr. Claude Manges on 2.4 Phosphorus - Dr. Claude Manges still wants pt to get his Xgeva injection.    Pt received Xgeva and Lupron injections. Xgeva given in right upper arm. Lupron given in right gluteal - pt tolerated it well.    AVS received at scheduling.    Pt stable and ambulated independently from clinic.

## 2019-12-01 NOTE — Unmapped (Signed)
Patient was called to schedule [facility] Appointment for <DATE>    Did patient confirm appointment? No    If No, what action was taken?     Patient declined appt       I spoke with patient and gave him other options that aren't [facility] locations for [place]. Patient was also given the number and location for his local [facility] for drive thru testing. I also transferred patient to their office to schedule an appointment. Patient voiced understanding.        Kimberlee A Pinnix  P Covid Rdc Schedulers      ??      Good morning,   ??   Pt lives in [place], [place] and the provider doesn't think he will drive to [facility] to come to the [facility]. Could someone contact patient with a closer testing site?     Thank you,   Kimberlee Pinnix, CMA

## 2019-12-06 ENCOUNTER — Institutional Professional Consult (permissible substitution): Admit: 2019-12-06 | Discharge: 2019-12-07 | Payer: MEDICAID | Attending: Registered" | Primary: Registered"

## 2019-12-06 DIAGNOSIS — C61 Malignant neoplasm of prostate: Principal | ICD-10-CM

## 2019-12-06 NOTE — Unmapped (Signed)
Enhanced Care Nutrition-Follow Up Phone Assessment    Nutrition Assessment:  Patient reports the appetite stimulant is helping. He reports he is doing better. Reports his intake is more.    Nutrition Diagnosis:  Undesirable food choices related to poor appetite as evidenced by skipping meals, eating highly processed foods    Patient Stated Health/Nutrition Goals:  Meet nutritional needs   Reduce long-term health risk    Identified treatment goals: Not applicable    Nutrition Interventions (ADVISE/ASSIST):  1. If not eating a meal have another Ensure or milk with added Carnation Instant Breakfast  ______________________________________________________________________  Joseph Phillips is a 58 y.o. male seen for medical nutrition therapy.    Referring MD or Clinic:   Self, Referred  Max Jeri Cos, MD    Reason for Referral:   1. Prostate cancer (CMS-HCC)        Medical History:  Past Medical History:   Diagnosis Date   ??? Alcohol abuse    ??? COPD (chronic obstructive pulmonary disease) (CMS-HCC)    ??? Hx of radiation therapy    ??? Hypertension    ??? Polysubstance abuse (CMS-HCC)    ??? Prostate cancer (CMS-HCC)    ??? Seizures (CMS-HCC)        Anthropometrics:    Present Weight   Current BMI     Goal Weight   Current Current IBW     Present Height         Weight History:  Wt Readings from Last 6 Encounters:   12/01/19 66.9 kg (147 lb 8 oz)   11/03/19 70.4 kg (155 lb 3.2 oz)   10/27/19 70.8 kg (156 lb 1.6 oz)   09/07/19 68.6 kg (151 lb 4.8 oz)   07/19/19 73.5 kg (162 lb)   07/12/19 73.5 kg (162 lb 1.6 oz)       Medications, Herbs, Supplements:  Reviewed nutritionally relevant medications and supplements.    Recent Labs:   Hemoglobin A1C (%)   Date Value   03/23/2014 4.8   04/17/2012 5.1      No components found for: LDLCALC   BP Readings from Last 3 Encounters:   12/01/19 134/72   11/26/19 117/71   11/03/19 119/72     Lab Results   Component Value Date    CHOL 98 (L) 09/26/2018    CHOL 125 03/23/2014    CHOL 129 09/11/2011 Lab Results   Component Value Date    HDL 36 (L) 09/26/2018    HDL 44 03/23/2014    HDL 45 09/11/2011     Lab Results   Component Value Date    LDL 33 (L) 09/26/2018    LDL <30 03/23/2014    LDL 62 09/11/2011     Lab Results   Component Value Date    VLDL 29.4 09/26/2018     Lab Results   Component Value Date    CHOLHDLRATIO 2.7 09/26/2018     Lab Results   Component Value Date    TRIG 147 09/26/2018    TRIG 142 09/11/2011       Physical Activity:  Tries to walk a little    Dietary Restrictions, Food Intolerances:  Bread-feels like it gets stuck    Other GI Issues:  Nausea in the morning    Allergies:   Allergies   Allergen Reactions   ??? Penicillins      Other reaction(s): UNKNOWN   ??? 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    Behavioral Risk Factors:  Sleep- N/A  Fast Eating  N/A  Overeating  N/A    Hunger and Satiety Issues:  Appetite: Getting better    Patient Questions:  None    Estimated Needs:  Estimated Energy Needs: 1800-2100 kcal/day    Estimated Protein Needs: 65-75 gm/day    Materials Provided To Meet their identified goals:  List of recommendations    Food labels discussed: Yes    Expected Compliance/Barriers are:   Comprehension of plan fair  Readiness for change fair  Ability to meet goals fair    We assessed family/social/cultural characteristics and these were relevant to care: none evident    Patient has auditory or visual communication barrier or need: no    Interventions Codes:  General/healthful diet     Follow-up (ARRANGE):  2 months      Length of visit was 22 minuntes. Patient understands diagnosis and treatment plan. Patient voices understanding. Plan is consistent with the patient???s preferences and goals.    This visit is conducted via telephone conferencing.    Contact Information  Person Contacted: Patient  Contact Phone number: [phone] (home)   Is there someone else in the room? No.     The patient was physically located in [place] or a state in which I am permitted to provide care. The patient and/or parent/gauardian understood that s/he may incur co-pays and cost sharing, and agreed to the telemedicine visit. The visit was completed via phone and/or video, which was appropriate and reasonable under the circumstances given the patient's presentation at the time.    The patient and/or parent/guardian has been advised of the potential risks and limitations of this mode of treatment (including, but not limited to, the absence of in-person examination) and has agreed to be treated using telemedicine. The patient's/patient's family's questions regarding telemedicine have been answered.     If the phone/video visit was completed in an ambulatory setting, the patient and/or parent/guardian has also been advised to contact their provider???s office for worsening conditions, and seek emergency medical treatment and/or call 911 if the patient deems either necessary.

## 2019-12-06 NOTE — Unmapped (Signed)
Patient Stated Health/Nutrition Goals:  Meet nutritional needs   Reduce long-term health risk    Identified treatment goals: Not applicable    Nutrition Interventions (ADVISE/ASSIST):  1. If not eating a meal have another Ensure or milk with added [employer]

## 2019-12-06 NOTE — Unmapped (Signed)
I was the supervising physician in the delivery of the service. Joseph Lorenzi E Kimel-Scott, MD

## 2019-12-08 DIAGNOSIS — C61 Malignant neoplasm of prostate: Principal | ICD-10-CM

## 2019-12-08 MED ORDER — ABIRATERONE, SUBMICRONIZED 125 MG TABLET
ORAL_TABLET | Freq: Every day | ORAL | 11 refills | 0 days | Status: CP
Start: 2019-12-08 — End: ?
  Filled 2019-12-10: qty 120, 30d supply, fill #0

## 2019-12-08 MED ORDER — METHYLPREDNISOLONE 4 MG TABLET
ORAL_TABLET | Freq: Two times a day (BID) | ORAL | 11 refills | 30.00000 days | Status: CP
Start: 2019-12-08 — End: ?
  Filled 2019-12-10: qty 60, 30d supply, fill #0

## 2019-12-09 NOTE — Unmapped (Signed)
[facility]   Patient Onboarding/Medication Counseling    Joseph Phillips is a 58 y.o. male with prostate cancer who I am counseling today on initiation of therapy.  I am speaking to the patient.    Was a Nurse, learning disability used for this call? No    Verified patient's date of birth / HIPAA.    Specialty medication(s) to be sent: Hematology/Oncology: Joseph Phillips      Non-specialty medications/supplies to be sent: methylprednisolone 4 mg      Medications not needed at this time: none       Joseph Phillips (Abiraterone)    Medication & Administration     Dosage: Take 4 tablets (500 mg) by mouth daily.   Methylprednisolone 4 mg: Take 1 tablet (4 mg total) by mouth Two (2) times a day. Take when on abiraterone.    Administration:   ? Abiraterone micronized Joseph Phillips) may be administered without regard to food.  ? Swallow tablets whole with water. Do not crush or chew.  ? Take with calcium and vitamin D    Adherence/Missed dose instructions:  ? If miss a dose, wait until next day to take normal dose    Goals of Therapy     ? Prevent disease progression    Side Effects & Monitoring Parameters     ? Common side effects  ? Flushing/hot flashes  ? Signs of high blood pressure  (bad headache, dizziness/passing out, or change in eyesight)  ? Muscle pain  ? Joint pain or swelling  ? Heartburn  ? Cough, stuffy nose, sore throat  ? Upset stomach, nausea or vomiting  ? Diarrhea or constipation  ? Feeling weak or tired  ? Trouble sleeping    ? The following side effects should be reported to the provider:  ? Allergic reaction  ? Electrolyte problems (mood changes, confusion, muscle pain, abnormal heartbeat, seizures)  ? Signs of weak adrenal gland (bad upset stomach or throwing up, very bad dizziness or passing out, muscle weakness, feeling very tired, mood changes, not hungry, or weight loss)  ? Signs of UTI  ? Signs of high blood sugar (confusion, sleepiness, increased thirst, hunger, and urination, fruity breath)  ? Feeling weak or tired ? Bruising  ? Bone pain  ? Swelling, weight gain, or trouble breathing  ? Liver problems (dark urine, not hungry, upset stomach, light colored stool, yellow skin or eyes    Monitoring Parameters  ? LFT monitoring every 2 weeks for 3 months and monthly thereafter  ? Serum potassium levels frequently during treatment  ? Monitor blood pressure    Contraindications, Warnings, & Precautions     ? Contraindicated in women who are or may become pregnant  ? Adrenocortical insufficiency  ? Hepatotoxicity  ? Mineralocorticoid excess - monitor for hypertension, hypokalemia, and fluid retention. Take with a corticosteroid.  ? Cardiovascular disease - control hypertension and correct hypokalemia prior to and during treatment.    Drug/Food Interactions     ? Medication list reviewed in Epic. The patient was instructed to inform the care team before taking any new medications or supplements. No drug interactions identified.   ? List any food/vaccine restrictions here.     Storage, Handling Precautions, & Disposal     ? Store at room temperature  ? This drug is considered hazardous and should be handled as little as possible.  If someone else helps with medication administration, they should wear gloves.    Current Medications (including OTC/herbals), Comorbidities and Allergies  Current Outpatient Medications   Medication Sig Dispense Refill   ??? abiraterone, submicronized 125 mg Tab Take 4 tablets (500 mg) by mouth daily. 120 tablet 11   ??? albuterol HFA 90 mcg/actuation inhaler INHALE 2 PUFFS EVERY 6 HOURS AS NEEDED FOR WHEEZING 8.5 g 0   ??? amLODIPine (NORVASC) 10 MG tablet Take 1 tablet (10 mg total) by mouth daily. 30 tablet 11   ??? busPIRone (BUSPAR) 5 MG tablet TAKE 1 TABLET BY MOUTH THREE TIMES A DAY 270 tablet 3   ??? calcium carbonate 650 mg calcium (1,625 mg) tablet Take 1 tablet (650 mg of elem calcium total) by mouth daily. 30 tablet 11 ??? diclofenac sodium (VOLTAREN) 1 % gel Apply 2 g topically Four (4) times a day. Any area of pain 200 g 5   ??? diphenhydrAMINE (BENADRYL) 25 mg tablet Take 0.5 tablets (12.5 mg total) by mouth every six (6) hours as needed for itching. 30 capsule 0   ??? diphenhydramine HCl (MAGIC MOUTHWASH ORAL) suspension Take 10 mL by mouth 4 (four) times a day as needed. 120 mL 1   ??? famotidine (PEPCID) 20 MG tablet Take 1 tablet (20 mg total) by mouth two (2) times a day as needed for heartburn. 60 tablet 0   ??? fentaNYL (DURAGESIC) 25 mcg/hr patch Place 1 patch on the skin every third day. 10 patch 0   ??? megestroL (MEGACE) 400 mg/10 mL (40 mg/mL) suspension Take 10 mL (400 mg total) by mouth Two (2) times a day. 480 mL 12   ??? melatonin 3 mg Tab TK 1 T PO HS PRN     ??? methylPREDNISolone (MEDROL) 4 MG tablet Take 1 tablet (4 mg total) by mouth Two (2) times a day. Take when on abiraterone. 60 tablet 11   ??? MULTIVITAMIN 50 PLUS Tab TK 1 T PO D     ??? nicotine (NICODERM CQ) 14 mg/24 hr patch Place 1 patch on the skin daily. 28 patch 0   ??? omeprazole (PRILOSEC) 20 MG capsule Take 2 capsules by mouth Two (2) times a day.     ??? oxyCODONE (ROXICODONE) 5 MG immediate release tablet Take 2 tablets (10 mg total) by mouth every four (4) hours as needed for pain. May increase to 3 tablets as needed. 120 tablet 0   ??? pancrelipase, Lip-Prot-Amyl, (CREON) 24,000-76,000 -120,000 unit CpDR delayed release capsule Take 2 capsules (48,000 units of lipase total) by mouth Three (3) times a day with a meal. Take an additional 1 capsule with snacks. 540 each 3   ??? polyethylene glycol (MIRALAX) 17 gram/dose powder Take 17 g by mouth daily. 595 g 11   ??? prochlorperazine (COMPAZINE) 10 MG tablet TAKE 1 TABLET(10 MG) BY MOUTH EVERY 8 HOURS AS NEEDED 30 tablet 1   ??? salmeteroL (SEREVENT) 50 mcg/dose diskus inhaler Inhale 1 puff Two (2) times a day. 1 Inhaler 12 ??? senna (SENOKOT) 8.6 mg tablet Take 2 tablets by mouth Two (2) times a day. As neeeded to have at least 1 BM daily 60 tablet 2   ??? tiotropium (SPIRIVA WITH HANDIHALER) 18 mcg inhalation capsule Place 1 capsule (18 mcg total) into inhaler and inhale once daily. 90 capsule 3   ??? traZODone (DESYREL) 100 MG tablet Take 1 tablet (100 mg total) by mouth nightly as needed for sleep. 90 tablet 3   ??? valACYclovir (VALTREX) 500 MG tablet Take 1 tablet (500 mg total) by mouth daily. 90 tablet 3  No current facility-administered medications for this visit.        Allergies   Allergen Reactions   ??? Penicillins      Other reaction(s): UNKNOWN   ??? Tramadol Dizziness       Patient Active Problem List   Diagnosis   ??? 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 Acute pancreatitis   ??? Depressive disorder   ??? Reflux esophagitis   ??? Encounter for long-term (current) use of other medications   ??? Pain medication agreement broken   ??? Do not give narcotics   ??? PPD screening test   ??? Tobacco use disorder   ??? Prostate cancer (CMS-HCC)   ??? Hx of radiation therapy   ??? Healthcare maintenance   ??? Sleep difficulties   ??? Essential hypertension   ??? Anxiety   ??? Hypokalemia   ??? Hypomagnesemia   ??? Herpes zoster involving sacral dermatome   ??? Stomatitis and mucositis   ??? Odynophagia   ??? Mucositis due to chemotherapy   ??? Chest pain, mid sternal   ??? Acute midline thoracic back pain   ??? Hyperkalemia   ??? COPD (chronic obstructive pulmonary disease) (CMS-HCC)   ??? Chronic pancreatitis (CMS-HCC)   ??? 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 Cough   ??? Fever and chills   ??? Acute alcoholic gastritis without hemorrhage   ??? Alcohol withdrawal syndrome without complication (CMS-HCC)       Reviewed and up to date in Epic.    Appropriateness of Therapy     Is medication and dose appropriate based on diagnosis? Yes    Prescription has been clinically reviewed: Yes    Baseline Quality of Life Assessment How many days over the past month did your prostate cancer keep you from your normal activities? 0    Financial Information     Medication Assistance provided: None Required    Anticipated copay of $3 / 30 reviewed with patient. Verified delivery address.    Delivery Information     Scheduled delivery date: 12/10/19    Expected start date: 12/10/19    Medication will be delivered via Same Day Courier to the prescription address in [facility].  This shipment will not require a signature.      Explained the services we provide at [facility] and that each month we would call to set up refills.  Stressed importance of returning phone calls so that we could ensure they receive their medications in time each month.  Informed patient that we should be setting up refills 7-10 days prior to when they will run out of medication.  A pharmacist will reach out to perform a clinical assessment periodically.  Informed patient that a welcome packet and a drug information handout will be sent.      Patient verbalized understanding of the above information as well as how to contact the pharmacy at [phone] option [phone] with any questions/concerns.  The pharmacy is open Monday through Friday 8:30am-4:30pm.  A pharmacist is available 24/7 via pager to answer any clinical questions they may have.    Patient Specific Needs     ? Does the patient have any physical, cognitive, or cultural barriers? No    ? Patient prefers to have medications discussed with  Patient     ? Is the patient or caregiver able to read and understand education materials at a high school level or above? Yes    ?  Patient's primary language is  English     ? Is the patient high risk? Yes, patient is taking oral chemotherapy. Appropriateness of therapy has been assessed.     ? Does the patient require a Care Management Plan? No ? Does the patient require physician intervention or other additional services (i.e. nutrition, smoking cessation, social work)? No      Joseph Phillips [facility] Specialty Pharmacist

## 2019-12-09 NOTE — Unmapped (Signed)
[facility] Specialty Medication Onboarding    Specialty Medication: Caryl Pina  Prior Authorization: Not Required   Financial Assistance: No - copay  <$25  Final Copay/Day Supply: $3 / 30    Insurance Restrictions: None     Notes to Pharmacist:     The triage team has completed the benefits investigation and has determined that the patient is able to fill this medication at [facility]. Please contact the patient to complete the onboarding or follow up with the prescribing physician as needed.

## 2019-12-09 NOTE — Unmapped (Signed)
Contact the patient to discuss rising PSA.  He is still having bone pain, but getting adequate pain control.  We discussed options, including retrial of chemotherapy, with or without the same agent, a retrial of abiraterone.  As the patient's PSA had not definitely risen while on abiraterone, will try restarting this agent, but switch to the find particle version, dosing of 500 mg (4 x 125 mg tablets) daily with methylprednisolone  4 mg twice a day.  He will keep his previously scheduled appointments.

## 2019-12-10 MED FILL — YONSA 125 MG TABLET: 30 days supply | Qty: 120 | Fill #0 | Status: AC

## 2019-12-10 MED FILL — METHYLPREDNISOLONE 4 MG TABLET: 30 days supply | Qty: 60 | Fill #0 | Status: AC

## 2019-12-13 DIAGNOSIS — C61 Malignant neoplasm of prostate: Principal | ICD-10-CM

## 2019-12-13 MED ORDER — FENTANYL 25 MCG/HR TRANSDERMAL PATCH
MEDICATED_PATCH | TRANSDERMAL | 0 refills | 30.00000 days | Status: CP
Start: 2019-12-13 — End: ?

## 2019-12-13 NOTE — Unmapped (Signed)
Hi,    Patient Joseph Phillips called requesting a medication refill for the following:    ? Medication: DURAGESIC  ? Dosage: 25 mcg/hr patch  ? Days left of medication: 2  ? Pharmacy: [facility]    The expected turnaround time is 3-4 business days       Check Indicates criteria has been reviewed and confirmed with the patient:    [x]  Preferred Name   [x]  DOB and/or MR#  [x]  Preferred Contact Method  [x]  Phone Number(s)   [x]  Preferred Pharmacy   []  MyChart     Thank you,  Drema Balzarine  [facility]  [phone]

## 2019-12-20 DIAGNOSIS — C61 Malignant neoplasm of prostate: Principal | ICD-10-CM

## 2019-12-20 MED ORDER — OXYCODONE 5 MG TABLET
ORAL_TABLET | ORAL | 0 refills | 13 days | Status: CP | PRN
Start: 2019-12-20 — End: ?

## 2019-12-20 NOTE — Unmapped (Signed)
I have successfully contact the patient's brother, Joseph Phillips and the patient as well to review the following:    ?? At the present time, Dr. Claude Manges is willing to renew the patient's oxycodone prescription approximately every 28-30 days.  ?? The most recent dispense of oxycodone was completed on 12/01/2019.    ?? Follow-up with Dr. Claude Manges is tentatively set for 12/29/2019.    ?? The patient would like Dr. Claude Manges to be aware that his pain has recently increased, and he is using the oxycodone regularly between 2-3 times a day.  ?? The patient's brother reports calling EMS recently due to Joseph Phillips severe level of pain.    This information was routed to Dr. Claude Manges for interpretation and review.

## 2019-12-20 NOTE — Unmapped (Signed)
Hi,    Patient Joseph Phillips called requesting a medication refill for the following:    ? Medication: ROXICODONE  ? Dosage: 5 MG  ? Days left of medication: 0  ? Pharmacy: [facility]     The expected turnaround time is 3-4 business days       Check Indicates criteria has been reviewed and confirmed with the patient:    [x]  Preferred Name   [x]  DOB and/or MR#  [x]  Preferred Contact Method  [x]  Phone Number(s)   [x]  Preferred Pharmacy   []  MyChart     Thank you,  Drema Balzarine  [facility]  [phone]

## 2019-12-21 NOTE — Unmapped (Signed)
As directed by Dr. Claude Manges, I have contacted the patient to review with him the following:    ?? Given [name] continued oncology related bone pain and frequent use of oxycodone, his prescription for oxycodone has been refilled and the quantity adjusted to 150 tablets.    ?? This new prescription was completed electronically and sent to [facility] in [place], [place].    [name] expressed understanding and had no other questions or concerns at this time and is agreeable to the plan outlined in the prior note associated with this encounter.  He is to call the clinic back sooner should he have any other issues.    No other actions taken and Dr. Claude Manges is aware.

## 2019-12-22 NOTE — Unmapped (Signed)
Initial Visit Note    Patient Name: Joseph Phillips  Patient Age: 58 y.o.  Encounter Date: 12/29/2019    Referring Physician:     Unknown Foley, MD  [address]  [place],  [place] [address]    Primary Care Provider:  Unknown Foley, MD    Assessment/Plan:    Reason for visit:  Prostate Cancer    Cancer Staging  Prostate cancer (CMS-HCC)  Staging form: Prostate, AJCC 7th Edition  - Clinical: Stage IV (T3a, N0, M1b, PSA: Less than 10, Gleason 7) - Unsigned      --Prostate cancer, stage IV,      -- with progression on Lupron/Zytiga     --07/14/18: Docetaxel/prednisone     --11/11/2018: Neutropenic fever, with possible UTI and oral mucositis     -- 02/03/19: [name];  Held 03/01/19 for LE swelling/cellulitis; restart 03/29/19 at 80 mg qD     -- 08/16/19:  [name] increased to 160 mg daily  --Hypokalemia  --Hypomagnesemia  -- Alcoholism      -- 06/22/19: Admitted for alcoholic gastritis  --Leg cramps and pain, exacerbated by electrolyte disturbances      --03/01/19: Skin changes in the lower extremities of uncertain etiology.  --Teeth in poor repair, awaiting dental correction  --Hypertension  --Lower extremity swelling, exacerbated at night; improved on triamterene-HCTZ  -- OSA; positive study for both central and obstructive apnea; CPAP trial recommended, 07/23/2018; deferred by the patient  --History of pancreatitis with pancreatic insufficiency; on Creon; 2/2 EtOHism      Recommendation  --Patient's clinical condition appears to be deteriorating with increasing pain, rising ALP, and likely continued rising PSA.  --He was switched from Xtandi to submicronized abiraterone, starting about 2 weeks ago.  --We will continue with his therapy, and have the patient return in 1 month for reevaluation.  We have discussed treatment alternatives if this treatment is not effective.  Treatments might include radium 223, mitoxantrone or cabazitaxel.  The patient is not a good candidate for pembrolizumab (MS high???S), or PARP inhibitor therapy --Fentanyl was increased to 50 mcg every 3 days, and oxycodone to 10 mg tablet, every 4 hours as needed          I have reviewed the laboratory, pathology, and radiology reports in detail and discussed findings with the patient    History of Present Illness:     Joseph Phillips is a 58 y.o. male who is seen in consultation at the request of Unknown Foley, MD for an evaluation of Prostate Cancer.  Patient returns with increasing bone pain.  His last ALP and PSA had increased, and he was switched from plain enzalutamide to the submicronized form of abiraterone.  He has been on this new medication for about 2 weeks, and so far is tolerating it reasonably well.  He has not noticed any improvement in bone pain.  Is passing urine reasonably well.  His appetite seems to be acceptable on Megace, but he is continue to lose some weight; he is down about 3 pounds.  He is not having problems with shortness of breath, headaches, visual changes, numbness or tingling, rash, swollen glands.  Patient is currently taking fentanyl 25 mcg every 3 days, and oxycodone 5 mg tablets, 2 or 3 every 4-6 hours as needed.  Even with this regimen, he is not getting very good control of the pain.        Oncology History:  Patient was originally diagnosed with prostate cancer in 2006.  He underwent robot-assisted prostatectomy.  He did well until May 2018, when his PSA rose to a high of about 45, and he was started on Lupron, initially with Casodex, and then with apparatus.  His PSA fell and remained low until recently when he was noted to be rising from a low of 0.82 on  01/01/18, to 3.99 on 06/29/2018.  He was instructed to hold Zytiga.  Since holding Zytiga he is noted swelling in his ankles.  The swelling comes on overnight, improves during the course the day.  He was evaluated with Doppler studies on 06/29/2018 which showed no evidence of blood clots.  He is still bothered by the swelling.  Restaging studies done on 06/04/2018 including bone scan, and CT of the chest abdomen pelvis, showed sclerotic changes throughout the axial skeleton, and involvement also of scapula, some ribs and pelvis.  The sites were new since 2016, and also probably new since the CT stone study in May 2018.  Bone scan also showed some progression in disease by report, compared to 2018.  He has developed pain in the back of his calves, for which he takes oxycodone (Tylenol and ibuprofen were ineffective).  The pain followed prolonged episodes of cramping, felt to be related to low magnesium and potassium.  About 6 months ago he had shingles in a left L2 distribution.        Oncology History   Prostate cancer (CMS-HCC)   12/02/2008 Biopsy    Prostate, left apex, needle biopsy  - Adenocarcinoma, Gleason combined score 6 (3+3), 2 of 2 cores involved, tumor  approximately 2 mm in greatest diameter, approximately 10% of tissue involved by  carcinoma.  - See comment    E: ??Prostate, left mid, needle biopsy  - Adenocarcinoma, Gleason combined score 6 (3+3), 1 of 2 cores involved, tumor  approximately 2 mm in greatest diameter, approximately 5% of tissue involved by  carcinoma.  - See comment    F: ??Prostate, left base, needle biopsy  - Adenocarcinoma, Gleason combined score 6 (3+3), 1 of 2 cores involved, tumor  approximately 1 mm in greatest diameter, approximately 5% of tissue involved by  carcinoma.     01/24/2009 Surgery    Prostate, robot assisted laparoscopic prostatectomy  Tumor histologic type: Prostatic adenocarcinoma  Tumor grade (Gleason system): Gleason pattern 7 (3+4)  Approximate percentage of prostate involved by tumor: 40%  Extent of tumor:  Location of tumor involvement in prostate: right and left lobes, anterior  and posterior, apex to base  Confinement/non-confinement of tumor within the prostatic capsule: ??not  confined  Location of extracapsular tumor involvement: ??left prostate toward the base  (ie. block A22)  Type of extracapsular tissue involved within tumor: ?? adipose tissue/perineural tissue  Angiolymphatic space invasion: not identified  Seminal vesicles: negative  Vas deferens: negative  Histologic assessment of surgical margins: positive margin along a 3 mm  face in the left prostate base (block A25)    Other significant findings: High grade prostatic intraepithelial  Neoplasm;  Lymph nodes: Not applicable  AJCC Staging: pT3a pNx     08/30/2013 Initial Diagnosis    Prostate cancer (CMS-HCC)     04/30/2017 -  Chemotherapy    Lupron 22.5 mg; with bicalutamide x  month     08/01/2017 - 06/30/2018 Chemotherapy    Abiaterone 1000 mg daily; decrease to 750 mg daily at 10/2018;     10/30/2017 -  Cancer Staged    Starta: TP53, TMPSSR  -EGR fusion; no other  abnormalities identified; MSI-stable; no defects in homologous recombination genes.     01/14/2018 -  Cancer Staged    CT Chest:  Numerous sclerotic metastases of the thoracic spine, left scapula  and multiple ribs.  3. Coronary artery and aortic atherosclerosis (ICD10-I70.0).  4. Previously demonstrated left renal mass is not included in the  field of view.     06/04/2018 -  Cancer Staged    CT CAP:  Scattered diffuse sclerotic metastatic lesions are seen throughout the spine as well as the left scapula; some of the lesions are new since prior CT dated 04/20/2015.  - No intrathoracic metastatic disease.  Interval progression of osseous metastases throughout the spine and pelvis compared to 04/22/2017.  -- Cholelithiasis.  -- Hepatic steatosis.     06/04/2018 -  Cancer Staged    Bone scan:  Increased radiotracer uptake throughout the spine, sacrum, and left ilium, consistent with numerous metastases better evaluated on same-day CT. Unchanged metastasis to the left proximal humerus.     07/14/2018 - 11/12/2018 Chemotherapy    OP PROSTATE DOCETAXEL/PREDNISONE  Docetaxel 75 mg/m2 evry 21 days     01/07/2019 -  Cancer Staged    CTA Chest:  No evidence of pulmonary embolism.  2. Occlusion of the medial basilar segmental bronchus of the left  lower lobe, nonspecific though could be seen in the setting of  aspiration. Otherwise, no acute findings.  3. Apical predominant paraseptal emphysematous change with suspected  mild progression of smoking related fibrosis with subpleural  reticulation but without evidence of honeycombing. Emphysema  (ICD10-J43.9).  4. Progression of osseous metastatic disease since 11/2017. Detailed  above. No definitive pathologic compression fractures.  5. Hepatic steatosis. 6. Coronary artery calcifications. Aortic Atherosclerosis     01/20/2019 -  Cancer Staged    NM Bone scan: Redemonstrated multiple abnormal foci throughout the appendicular axial skeleton, with interval increase in the extent of the T10 vertebral body lesion and new lesions involving the manubrium, left fifth and seventh ribs. This is most consistent with progression of osseous metastatic disease.     02/03/2019 -  Chemotherapy    Xtandi 160 mg qD; held after 1 month due to swelling.  Restart at 80 mg qD on 03/29/19     03/10/2019 -  Cancer Staged    CTA chest:  No evidence of acute aortic intramural hematoma or dissection.  Multifocal osseous sclerotic metastatic disease, progressed when compared to prior CT 06/04/2018 and grossly similar to most recent nuclear medicine bone scan.  Mucoid impacted airways in left lower lobe with adjacent subtle ground glass opacities; likely represents sequelae of aspiration. Recommend follow up CT in 6 to 8 weeks to ensure clearing.  Other chronic/incidental findings as above, not substantially changed.     09/02/2019 -  Cancer Staged    CTA Chest:  Interval increased sclerosis of diffuse osseous metastatic disease compared to 03/10/2019, likely reflects treatment related changes         09/07/2019 Endocrine/Hormone Therapy    OP LEUPROLIDE (7.5 MG EVERY MONTH)  Plan Provider: Towanda Malkin, MD     10/06/2019 -  Cancer Staged    CT AP:  Diffuse sclerotic metastases throughout the visualized skeleton, similar to prior exam.  No adenopathy. Prostate surgically absent         Past Medical History:   Diagnosis Date   ??? Alcohol abuse    ??? COPD (chronic obstructive pulmonary disease) (CMS-HCC)    ??? Hx of radiation therapy    ???  Hypertension    ??? Polysubstance abuse (CMS-HCC)    ??? Prostate cancer (CMS-HCC)    ??? Seizures (CMS-HCC)       Past Surgical History:   Procedure Laterality Date   ??? IR INSERT PORT AGE GREATER THAN 5 YRS  07/15/2018    IR INSERT PORT AGE GREATER THAN 5 YRS 07/15/2018 Carolin Coy, MD [facility]   ??? PR COLSC FLX W/RMVL OF TUMOR POLYP LESION SNARE TQ N/A 08/16/2015    Procedure: COLONOSCOPY FLEX; W/REMOV TUMOR/LES BY SNARE;  Surgeon: Gwen Pounds, MD;  Location: [facility];  Service: Gastroenterology   ??? PR COLSC FLX WITH DIRECTED SUBMUCOSAL NJX ANY SBST N/A 08/16/2015    Procedure: COLONOSCOPY, FLEXIBLE, PROXIMAL TO SPLENIC FLEXURE; WITH DIRECTED SUBMUCOSAL INJECTION(S), ANY SUBSTANCE;  Surgeon: Gwen Pounds, MD;  Location: [facility];  Service: Gastroenterology   ??? PROSTATE SURGERY          Family History   Problem Relation Age of Onset   ??? Heart disease Mother    ??? Diabetes Mother    ??? Diabetes Father    ??? Melanoma Neg Hx    ??? Basal cell carcinoma Neg Hx    ??? Squamous cell carcinoma Neg Hx       Family Status   Relation Name Status   ??? Mother  (Not Specified)   ??? Father  (Not Specified)   ??? Neg Hx  (Not Specified)     Social History     Occupational History   ??? Occupation: DISABLED   Tobacco Use   ??? Smoking status: Current Some Day Smoker     Packs/day: 0.50     Years: 40.00     Pack years: 20.00     Types: Cigarettes     Start date: 05/09/1994   ??? Smokeless tobacco: Never Used   Substance and Sexual Activity   ??? Alcohol use: Yes     Comment: Pt last drank 6 months   ??? Drug use: Yes     Types: Marijuana   ??? Sexual activity: Not Currently       Allergies   Allergen Reactions   ??? Penicillins      Other reaction(s): UNKNOWN   ??? Tramadol Dizziness       Current Outpatient Medications Medication Sig Dispense Refill   ??? abiraterone, submicronized 125 mg Tab Take 4 tablets (500 mg) by mouth daily. 120 tablet 11   ??? albuterol HFA 90 mcg/actuation inhaler INHALE 2 PUFFS EVERY 6 HOURS AS NEEDED FOR WHEEZING 8.5 g 0   ??? amLODIPine (NORVASC) 10 MG tablet Take 1 tablet (10 mg total) by mouth daily. 30 tablet 11   ??? busPIRone (BUSPAR) 5 MG tablet TAKE 1 TABLET BY MOUTH THREE TIMES A DAY 270 tablet 3   ??? calcium carbonate 650 mg calcium (1,625 mg) tablet Take 1 tablet (650 mg of elem calcium total) by mouth daily. 30 tablet 11   ??? diclofenac sodium (VOLTAREN) 1 % gel Apply 2 g topically Four (4) times a day. Any area of pain 200 g 5   ??? diphenhydrAMINE (BENADRYL) 25 mg tablet Take 0.5 tablets (12.5 mg total) by mouth every six (6) hours as needed for itching. 30 capsule 0   ??? diphenhydramine HCl (MAGIC MOUTHWASH ORAL) suspension Take 10 mL by mouth 4 (four) times a day as needed. 120 mL 1   ??? megestroL (MEGACE) 400 mg/10 mL (40 mg/mL) suspension Take 10 mL (  400 mg total) by mouth Two (2) times a day. 480 mL 12   ??? melatonin 3 mg Tab TK 1 T PO HS PRN     ??? methylPREDNISolone (MEDROL) 4 MG tablet Take 1 tablet (4 mg total) by mouth Two (2) times a day. Take when on abiraterone. 60 tablet 11   ??? MULTIVITAMIN 50 PLUS Tab TK 1 T PO D     ??? nicotine (NICODERM CQ) 14 mg/24 hr patch Place 1 patch on the skin daily. 28 patch 0   ??? omeprazole (PRILOSEC) 20 MG capsule Take 2 capsules by mouth Two (2) times a day.     ??? pancrelipase, Lip-Prot-Amyl, (CREON) 24,000-76,000 -120,000 unit CpDR delayed release capsule Take 2 capsules (48,000 units of lipase total) by mouth Three (3) times a day with a meal. Take an additional 1 capsule with snacks. 540 each 3   ??? polyethylene glycol (MIRALAX) 17 gram/dose powder Take 17 g by mouth daily. 595 g 11   ??? prochlorperazine (COMPAZINE) 10 MG tablet TAKE 1 TABLET(10 MG) BY MOUTH EVERY 8 HOURS AS NEEDED 30 tablet 1   ??? salmeteroL (SEREVENT) 50 mcg/dose diskus inhaler Inhale 1 puff Two (2) times a day. 1 Inhaler 12   ??? senna (SENOKOT) 8.6 mg tablet Take 2 tablets by mouth Two (2) times a day. As neeeded to have at least 1 BM daily 60 tablet 2   ??? tiotropium (SPIRIVA WITH HANDIHALER) 18 mcg inhalation capsule Place 1 capsule (18 mcg total) into inhaler and inhale once daily. 90 capsule 3   ??? valACYclovir (VALTREX) 500 MG tablet Take 1 tablet (500 mg total) by mouth daily. 90 tablet 3   ??? famotidine (PEPCID) 20 MG tablet Take 1 tablet (20 mg total) by mouth two (2) times a day as needed for heartburn. 60 tablet 0   ??? fentaNYL (DURAGESIC) 50 mcg/hr patch Place 1 patch on the skin every third day. 10 patch 0   ??? oxyCODONE (ROXICODONE) 10 mg immediate release tablet Take 1 tablet (10 mg total) by mouth every four (4) hours as needed for pain. 150 tablet 0   ??? predniSONE (DELTASONE) 5 MG tablet Take 1 tablet (5 mg total) by mouth Two (2) times a day. 60 tablet 5   ??? traZODone (DESYREL) 100 MG tablet Take 1 tablet (100 mg total) by mouth nightly as needed for sleep. 90 tablet 3     No current facility-administered medications for this visit.        The following portions of the patient's history were reviewed and updated as appropriate: allergies, current medications, past family history, past medical history, past social history, past surgical history and problem list.     Review of Systems   Constitutional: Positive for fatigue. Negative for appetite change, chills, diaphoresis, fever and unexpected weight change.   HENT:   Negative for mouth sores, nosebleeds and trouble swallowing.    Eyes: Negative for eye problems.   Respiratory: Negative for chest tightness, cough, hemoptysis and shortness of breath.    Cardiovascular: Negative for chest pain and leg swelling.   Gastrointestinal: Positive for abdominal pain, diarrhea, nausea and vomiting. Negative for blood in stool and constipation.   Genitourinary: Negative for difficulty urinating and hematuria.    Musculoskeletal: Positive for back pain and myalgias. Negative for arthralgias.   Skin: Negative for rash.   Neurological: Negative for extremity weakness, headaches and seizures.   Hematological: Negative for adenopathy.   Psychiatric/Behavioral: Negative for confusion.  All other systems reviewed and are negative.        Vital Signs for this encounter:    BSA: 1.8 meters squared  BP 112/69  - Pulse 72  - Temp 36.6 ??C (97.9 ??F) (Temporal)  - Resp 16  - Ht 174 cm (5' 8.5)  - Wt 67.1 kg (148 lb)  - SpO2 98%  - BMI 22.18 kg/m??          Physical Exam   Constitutional: He is oriented to person, place, and time.   HENT:   Head: Normocephalic.   Eyes: Pupils are equal, round, and reactive to light. EOM are normal.   Cardiovascular: Normal rate, regular rhythm and normal heart sounds.   Pulmonary/Chest: Effort normal and breath sounds normal.   Abdominal: Soft. Bowel sounds are normal. He exhibits no distension and no mass. There is abdominal tenderness.   Tenderness in the left upper and lower quadrants to palpation without mass or distention.   Musculoskeletal:         General: No tenderness.      Comments: Tenderness to palpation over the lower thoracic and lumbar spines, and to a lesser degree, elsewhere in the back.   Lymphadenopathy:     He has no cervical adenopathy.   No adenopathy neck, supraclavicular, axillary inguinal regions.   Neurological: He is alert and oriented to person, place, and time.   Skin: No rash noted.   Psychiatric: He has a normal mood and affect. His behavior is normal.       Karnofsky/Lansky Performance Status  80, Normal activity with effort; some signs or symptoms of disease (ECOG equivalent 1)    Results:    WBC   Date Value Ref Range Status   12/29/2019 9.3 3.5 - 10.5 10*9/L Final   04/17/2012 7.6 4.5 - 11.0 x10 9th/L Final     HGB   Date Value Ref Range Status   12/29/2019 8.5 (L) 13.5 - 17.5 g/dL Final   16/09/9603 54.0 13.5 - 17.5 G/DL Final     HCT   Date Value Ref Range Status   12/29/2019 25.0 (L) 38.0 - 50.0 % Final 04/17/2012 39.2 (L) 41.0 - 53.0 % Final     Platelet   Date Value Ref Range Status   12/29/2019 350 150 - 450 10*9/L Final   04/17/2012 242 150 - 440 x10 9th/L Final     LDH   Date Value Ref Range Status   05/27/2019 763 (H) 338 - 610 U/L Final     Creatinine Whole Blood, POC   Date Value Ref Range Status   07/25/2014 1.6 (H) 0.8 - 1.4 mg/dL Final     Creatinine, Whole Blood   Date Value Ref Range Status   07/25/2014 1.4 0.8 - 1.4 mg/dL Final     Creatinine   Date Value Ref Range Status   12/29/2019 0.57 (L) 0.70 - 1.30 mg/dL Final   98/10/9146 8.29 0.70 - 1.30 mg/dL Final     AST   Date Value Ref Range Status   12/29/2019 32 19 - 55 U/L Final   09/11/2011 38 19 - 55 U/L Final     PSA Diagnostic   Date Value Ref Range Status   09/28/2014 4.4 (H) 0.0 - 4.0 ng/mL Final   05/30/2014 5.0 (H) 0.0 - 4.0 ng/mL Final   03/23/2014 4.6 (H) 0.0 - 4.0 ng/mL Final   10/04/2013 3.7 0.0 - 4.0 ng/mL Final   06/24/2013 2.6 0.0 - 4.0 ng/mL  Final   01/18/2013 1.6 0.0 - 4.0 NG/ML Final   09/28/2012 1.5 0.0 - 4.0 NG/ML Final   07/03/2012 1.0 0.0 - 4.0 NG/ML Final   12/30/2011 0.5 0.0 - 4.0 NG/ML Final   07/15/2011 0.3 0.0 - 4.0 NG/ML Final   04/08/2011 0.3 0.0 - 4.0 NG/ML Final

## 2019-12-29 ENCOUNTER — Institutional Professional Consult (permissible substitution): Admit: 2019-12-29 | Discharge: 2019-12-29 | Payer: MEDICAID

## 2019-12-29 ENCOUNTER — Ambulatory Visit
Admit: 2019-12-29 | Discharge: 2019-12-29 | Payer: MEDICAID | Attending: Hematology & Oncology | Primary: Hematology & Oncology

## 2019-12-29 DIAGNOSIS — C61 Malignant neoplasm of prostate: Principal | ICD-10-CM

## 2019-12-29 DIAGNOSIS — C9210 Chronic myeloid leukemia, BCR/ABL-positive, not having achieved remission: Principal | ICD-10-CM

## 2019-12-29 LAB — CBC W/ AUTO DIFF
BASOPHILS ABSOLUTE COUNT: 0.1 10*9/L (ref 0.0–0.1)
BASOPHILS RELATIVE PERCENT: 1 %
EOSINOPHILS ABSOLUTE COUNT: 0.2 10*9/L (ref 0.0–0.7)
EOSINOPHILS RELATIVE PERCENT: 2.7 %
HEMATOCRIT: 25 % — ABNORMAL LOW (ref 38.0–50.0)
LYMPHOCYTES ABSOLUTE COUNT: 1.6 10*9/L (ref 0.7–4.0)
LYMPHOCYTES RELATIVE PERCENT: 17 %
MEAN CORPUSCULAR HEMOGLOBIN CONC: 34.1 g/dL (ref 30.0–36.0)
MEAN CORPUSCULAR HEMOGLOBIN: 30.3 pg (ref 26.0–34.0)
MEAN CORPUSCULAR VOLUME: 88.7 fL (ref 81.0–95.0)
MEAN PLATELET VOLUME: 7.5 fL (ref 7.0–10.0)
MONOCYTES ABSOLUTE COUNT: 0.6 10*9/L (ref 0.1–1.0)
MONOCYTES RELATIVE PERCENT: 6.5 %
NEUTROPHILS RELATIVE PERCENT: 72.8 %
PLATELET COUNT: 350 10*9/L (ref 150–450)
RED BLOOD CELL COUNT: 2.82 10*12/L — ABNORMAL LOW (ref 4.32–5.72)
RED CELL DISTRIBUTION WIDTH: 15.8 % — ABNORMAL HIGH (ref 12.0–15.0)

## 2019-12-29 LAB — GLUCOSE RANDOM: Glucose:MCnc:Pt:Ser/Plas:Qn:: 119

## 2019-12-29 LAB — PROSTATE SPECIFIC ANTIGEN: Prostate specific Ag:MCnc:Pt:Ser/Plas:Qn:: 265 — ABNORMAL HIGH

## 2019-12-29 LAB — PHOSPHORUS: Phosphate:MCnc:Pt:Ser/Plas:Qn:: 2.5 — ABNORMAL LOW

## 2019-12-29 LAB — HEPATIC FUNCTION PANEL
ALBUMIN: 3.8 g/dL (ref 3.5–5.0)
ALKALINE PHOSPHATASE: 448 U/L — ABNORMAL HIGH (ref 38–126)
BILIRUBIN TOTAL: 0.3 mg/dL (ref 0.0–1.2)
PROTEIN TOTAL: 6.5 g/dL (ref 6.5–8.3)

## 2019-12-29 LAB — CREATININE: EGFR CKD-EPI AA MALE: >90 mL/min/{1.73_m2} (ref >=60)

## 2019-12-29 LAB — CALCIUM
CALCIUM: 8.6 mg/dL (ref 8.5–10.2)
Calcium:MCnc:Pt:Ser/Plas:Qn:: 8.6

## 2019-12-29 LAB — PLATELET COUNT: Platelets:NCnc:Pt:Bld:Qn:Automated count: 350

## 2019-12-29 LAB — EGFR CKD-EPI AA MALE: Lab: >90

## 2019-12-29 LAB — AST (SGOT): Aspartate aminotransferase:CCnc:Pt:Ser/Plas:Qn:: 32

## 2019-12-29 MED ORDER — FENTANYL 50 MCG/HR TRANSDERMAL PATCH
MEDICATED_PATCH | TRANSDERMAL | 0 refills | 30.00000 days | Status: CP
Start: 2019-12-29 — End: 2020-12-28

## 2019-12-29 MED ORDER — OXYCODONE 10 MG TABLET
ORAL_TABLET | ORAL | 0 refills | 25 days | Status: CP | PRN
Start: 2019-12-29 — End: ?

## 2019-12-29 MED ORDER — PREDNISONE 5 MG TABLET
ORAL_TABLET | Freq: Two times a day (BID) | ORAL | 5 refills | 30.00000 days
Start: 2019-12-29 — End: ?

## 2019-12-29 NOTE — Unmapped (Signed)
--  We will try increasing fentanyl from 25 mcg to 50 mcg every 3 days.  --Oxycodone will be increased from 5 mg to 10 mg, and you may take 1 or 1-1/2 tablets every 4-6 hours as needed to control your pain.  --Continue on the abiraterone, submicronized tablet, taking 4 daily, along with prednisone 5 mg twice daily.  --Continue on Megace 2 teaspoons twice daily with food  --We will plan to see her back in 1 month to assess her progress.  -- Please do not hesitate to contact us if any new questions or problems arise. The clinic phone number is [phone], or send a message using My [facility] Chart

## 2019-12-29 NOTE — Unmapped (Signed)
Labs drawn via [place].    Port flushed and brisk blood return     Pt tolerated well.

## 2019-12-29 NOTE — Unmapped (Signed)
Labs completed via [place].     Pt received Xgeva injection in left deltoid - pt tolerated well.      Pt received Lupron injection in left gluteal - pt tolerated well.    AVS received at scheduling.    Pt stable and ambulated independently from clinic.

## 2019-12-31 DIAGNOSIS — J449 Chronic obstructive pulmonary disease, unspecified: Principal | ICD-10-CM

## 2019-12-31 NOTE — Unmapped (Signed)
[facility] Clinical Assessment & Refill Coordination Note    Joseph Phillips, DOB: 10-Jun-1961  Phone: [phone] (home)     All above HIPAA information was verified with patient.     Was a Nurse, learning disability used for this call? No    Specialty Medication(s):   Hematology/Oncology: Caryl Pina     Current Outpatient Medications   Medication Sig Dispense Refill   ??? abiraterone, submicronized 125 mg Tab Take 4 tablets (500 mg) by mouth daily. 120 tablet 11   ??? albuterol HFA 90 mcg/actuation inhaler INHALE 2 PUFFS EVERY 6 HOURS AS NEEDED FOR WHEEZING 8.5 g 0   ??? amLODIPine (NORVASC) 10 MG tablet Take 1 tablet (10 mg total) by mouth daily. 30 tablet 11   ??? busPIRone (BUSPAR) 5 MG tablet TAKE 1 TABLET BY MOUTH THREE TIMES A DAY 270 tablet 3   ??? calcium carbonate 650 mg calcium (1,625 mg) tablet Take 1 tablet (650 mg of elem calcium total) by mouth daily. 30 tablet 11   ??? diclofenac sodium (VOLTAREN) 1 % gel Apply 2 g topically Four (4) times a day. Any area of pain 200 g 5   ??? diphenhydrAMINE (BENADRYL) 25 mg tablet Take 0.5 tablets (12.5 mg total) by mouth every six (6) hours as needed for itching. 30 capsule 0   ??? diphenhydramine HCl (MAGIC MOUTHWASH ORAL) suspension Take 10 mL by mouth 4 (four) times a day as needed. 120 mL 1   ??? famotidine (PEPCID) 20 MG tablet Take 1 tablet (20 mg total) by mouth two (2) times a day as needed for heartburn. 60 tablet 0   ??? fentaNYL (DURAGESIC) 50 mcg/hr patch Place 1 patch on the skin every third day. 10 patch 0   ??? megestroL (MEGACE) 400 mg/10 mL (40 mg/mL) suspension Take 10 mL (400 mg total) by mouth Two (2) times a day. 480 mL 12   ??? melatonin 3 mg Tab TK 1 T PO HS PRN     ??? methylPREDNISolone (MEDROL) 4 MG tablet Take 1 tablet (4 mg total) by mouth Two (2) times a day. Take when on abiraterone. 60 tablet 11   ??? MULTIVITAMIN 50 PLUS Tab TK 1 T PO D     ??? nicotine (NICODERM CQ) 14 mg/24 hr patch Place 1 patch on the skin daily. 28 patch 0 ??? omeprazole (PRILOSEC) 20 MG capsule Take 2 capsules by mouth Two (2) times a day.     ??? oxyCODONE (ROXICODONE) 10 mg immediate release tablet Take 1 tablet (10 mg total) by mouth every four (4) hours as needed for pain. 150 tablet 0   ??? pancrelipase, Lip-Prot-Amyl, (CREON) 24,000-76,000 -120,000 unit CpDR delayed release capsule Take 2 capsules (48,000 units of lipase total) by mouth Three (3) times a day with a meal. Take an additional 1 capsule with snacks. 540 each 3   ??? polyethylene glycol (MIRALAX) 17 gram/dose powder Take 17 g by mouth daily. 595 g 11   ??? predniSONE (DELTASONE) 5 MG tablet Take 1 tablet (5 mg total) by mouth Two (2) times a day. 60 tablet 5   ??? prochlorperazine (COMPAZINE) 10 MG tablet TAKE 1 TABLET(10 MG) BY MOUTH EVERY 8 HOURS AS NEEDED 30 tablet 1   ??? salmeteroL (SEREVENT) 50 mcg/dose diskus inhaler Inhale 1 puff Two (2) times a day. 1 Inhaler 12   ??? senna (SENOKOT) 8.6 mg tablet Take 2 tablets by mouth Two (2) times a day. As neeeded to have at least 1  BM daily 60 tablet 2   ??? tiotropium (SPIRIVA WITH HANDIHALER) 18 mcg inhalation capsule Place 1 capsule (18 mcg total) into inhaler and inhale once daily. 90 capsule 3   ??? traZODone (DESYREL) 100 MG tablet Take 1 tablet (100 mg total) by mouth nightly as needed for sleep. 90 tablet 3   ??? valACYclovir (VALTREX) 500 MG tablet Take 1 tablet (500 mg total) by mouth daily. 90 tablet 3     No current facility-administered medications for this visit.         Changes to medications: [name] reports no changes at this time.    Allergies   Allergen Reactions   ??? Penicillins      Other reaction(s): UNKNOWN   ??? Tramadol Dizziness       Changes to allergies: No    SPECIALTY MEDICATION ADHERENCE     Yonsa 125 mg: 10 days of medicine on hand     Medication Adherence    Specialty Medication: Caryl Pina   Informant: patient  Support network for adherence: family member  Confirmed plan for next specialty medication refill: delivery by pharmacy Specialty medication(s) dose(s) confirmed: Regimen is correct and unchanged.     Are there any concerns with adherence? No    Adherence counseling provided? Not needed    CLINICAL MANAGEMENT AND INTERVENTION      Clinical Benefit Assessment:    Do you feel the medicine is effective or helping your condition? Yes    Clinical Benefit counseling provided? Not needed    Adverse Effects Assessment:    Are you experiencing any side effects? Yes, patient reports experiencing nausea/vomiting. Side effect counseling provided: Take with food    Are you experiencing difficulty administering your medicine? No    Quality of Life Assessment:    How many days over the past month did your prostate cancer keep you from your normal activities? For example, brushing your teeth or getting up in the morning. 0    Have you discussed this with your provider? Not needed    Therapy Appropriateness:    Is therapy appropriate? Yes, therapy is appropriate and should be continued    DISEASE/MEDICATION-SPECIFIC INFORMATION      N/A    PATIENT SPECIFIC NEEDS     ? Does the patient have any physical, cognitive, or cultural barriers? No    ? Is the patient high risk? Yes, patient is taking oral chemotherapy. Appropriateness of therapy as been assessed.     ? Does the patient require a Care Management Plan? No     ? Does the patient require physician intervention or other additional services (i.e. nutrition, smoking cessation, social work)? No      SHIPPING     Specialty Medication(s) to be Shipped:   Hematology/Oncology: Yonsa    Other medication(s) to be shipped: methylprednisolone     Changes to insurance: No    Delivery Scheduled: Yes, Expected medication delivery date: 01/06/20.     Medication will be delivered via Next Day Courier to the confirmed prescription address in [facility]. The patient will receive a drug information handout for each medication shipped and additional FDA Medication Guides as required.  Verified that patient has previously received a Conservation officer, historic buildings.    All of the patient's questions and concerns have been addressed.    Breck Coons [facility] Specialty Pharmacist;

## 2020-01-04 MED ORDER — AMLODIPINE 10 MG TABLET
ORAL_TABLET | 11 refills | 0 days | Status: CP
Start: 2020-01-04 — End: ?

## 2020-01-04 NOTE — Unmapped (Signed)
-----   Message from Unknown Foley, MD sent at 12/31/2019 11:32 AM EST -----  Regarding: Breathing Test follow up  Hey B,    Can you call this patient and let him know that because of his COPD, he's due for a surveillance breathing test? I will place the order and he can come and have it done without needing to see a doctor.     Thanks,    Max

## 2020-01-05 MED FILL — YONSA 125 MG TABLET: ORAL | 30 days supply | Qty: 120 | Fill #1

## 2020-01-05 MED FILL — METHYLPREDNISOLONE 4 MG TABLET: 30 days supply | Qty: 60 | Fill #1 | Status: AC

## 2020-01-05 MED FILL — METHYLPREDNISOLONE 4 MG TABLET: ORAL | 30 days supply | Qty: 60 | Fill #1

## 2020-01-05 MED FILL — YONSA 125 MG TABLET: 30 days supply | Qty: 120 | Fill #1 | Status: AC

## 2020-01-14 ENCOUNTER — Ambulatory Visit: Admit: 2020-01-14 | Discharge: 2020-01-14 | Disposition: A | Discharge status: Home / Self Care | Payer: MEDICAID

## 2020-01-14 DIAGNOSIS — C61 Malignant neoplasm of prostate: Principal | ICD-10-CM

## 2020-01-14 DIAGNOSIS — R202 Paresthesia of skin: Principal | ICD-10-CM

## 2020-01-14 MED ORDER — SPIRIVA WITH HANDIHALER 18 MCG AND INHALATION CAPSULES
ORAL_CAPSULE | Freq: Every day | RESPIRATORY_TRACT | 3 refills | 90 days | Status: CP
Start: 2020-01-14 — End: ?

## 2020-01-14 MED ORDER — GABAPENTIN 100 MG CAPSULE
ORAL_CAPSULE | Freq: Every evening | ORAL | 0 refills | 90.00000 days | Status: CP
Start: 2020-01-14 — End: ?

## 2020-01-14 MED ORDER — SALMETEROL 50 MCG/DOSE BLISTER POWDER FOR INHALATION
Freq: Two times a day (BID) | RESPIRATORY_TRACT | 12 refills | 0.00000 days | Status: CP
Start: 2020-01-14 — End: 2021-01-13

## 2020-01-14 NOTE — Unmapped (Signed)
Pt reports c/f developing shingles over the last 2 weeks on his L upper leg and groin area. This feels really similar to when I had shingles 2 years ago. Denies any pain. NAD currently.

## 2020-01-14 NOTE — Unmapped (Signed)
Previsit complete    Time spent on phone: 10    Reason for visit: follow up     Questions / Concerns that need to be addressed:    General Consent to Treat (GCT) for non-epic video visits only: Verbal consent      Allergies reviewed: Yes    Medication reviewed: Yes    Refills needed, if any:     Travel Screening: completed    Falls Risk: completed    Hark (DV) Screening: completed    HCDM reviewed and updated in Epic:  Completed

## 2020-01-14 NOTE — Unmapped (Signed)
[facility]  Emergency Department Provider Note    ED Clinical Impression     Final diagnoses:   Paresthesia of left leg (Primary)   Prostate cancer (CMS-HCC)     Initial Impression, ED Course, Assessment and Plan     Impression:   Patient is a 59 y.o. male with PMH of PMH of stage IV prostate cancer s/p prostatectomy (2006) and radiation therapy (currently on [place]), chronic pancreatitis, COPD, polysubstance abuse (EtOH, tobacco use, and opiates), hypertension, seizures, herpes zoster involving the sacral dermatome, and depression presenting with 2 weeks of pain, numbness, and tingling to his left thigh, worse last night, which he states feels similar to when he had shingles in the same area 3 years ago.    On exam, the patient appears well and in NAD. VS are unremarkable. Normal skin exam. No rash.     Given presentatiom, symptoms are not conisstent with shingles. Anticipate discharge home with recommendation to continue monitoring area and returning to the ED if he develops a rash. Will also recommend continuing pain medications as prescribed, PCP follow up and calling his Oncologist. Strict return precautions given. Patient expresses understanding and is agreeable to the plan.      Additional Medical Decision Making     This patient was evaluated in Emergency Department at the time of this visit for the symptoms described in the history of present illness. They were evaluated in the context of the global COVID-19 pandemic, which necessitated consideration that the patient might be at risk for infection with the SARS-CoV-2 virus that causes COVID-19. Institutional protocols and algorithms that pertain to the evaluation of patients at risk for COVID-19 were followed during the patient's care in the ED.    I have reviewed the vital signs and the nursing notes. Labs and radiology results that were available during my care of the patient were independently reviewed by me and considered in my medical decision making.     I staffed the case with the ED attending, Dr. Wylene Simmer.    I reviewed the patient's prior medical records (ED Provider Note 11/26/19).     Portions of this record have been created using Scientist, clinical (histocompatibility and immunogenetics). Dictation errors have been sought, but may not have been identified and corrected.  ____________________________________________    History     Chief Complaint  Tingling    HPI   Joseph Phillips is a 59 y.o. male with PMH of stage IV prostate cancer s/p prostatectomy (2006) and radiation therapy (currently on [place]), chronic pancreatitis, COPD, polysubstance abuse (EtOH, tobacco use, and opiates), hypertension, seizures, herpes zoster involving the sacral dermatome, and depression presenting to the ED for tingling. Patient reports 2 weeks of pain, numbness, and tingling to his left thigh, worse last night, which he states feels similar to when he had shingles in the same area 3 years ago. He states he is able to ambulate without difficulty. He also notes that the pain is worse at night. He states he uses 50 mcg fentanyl patches for pain which he replaces every 3 days. He states he also takes 10 mg oxycodone every 4 hours. He states he has not told his oncologist about these symptoms yet. His next follow up appointment is on 02/08/20. Last chemotherapy was last month. Denies leg weakness, fevers, headache, lightheaded ness, dizziness, chest pain, shortness of breath, nausea, diarrhea, or urinary symptoms.     Past Medical History:   Diagnosis Date   ???  Alcohol abuse    ??? COPD (chronic obstructive pulmonary disease) (CMS-HCC)    ??? Hx of radiation therapy    ??? Hypertension    ??? Polysubstance abuse (CMS-HCC)    ??? Prostate cancer (CMS-HCC)    ??? Seizures (CMS-HCC)      Patient Active Problem List   Diagnosis   ??? 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 Acute pancreatitis   ??? Depressive disorder   ??? Reflux esophagitis   ??? Encounter for long-term (current) use of other medications   ??? Pain medication agreement broken   ??? Do not give narcotics   ??? PPD screening test   ??? Tobacco use disorder   ??? Prostate cancer (CMS-HCC)   ??? Hx of radiation therapy   ??? Healthcare maintenance   ??? Sleep difficulties   ??? Essential hypertension   ??? Anxiety   ??? Hypokalemia   ??? Hypomagnesemia   ??? Herpes zoster involving sacral dermatome   ??? Stomatitis and mucositis   ??? Odynophagia   ??? Mucositis due to chemotherapy   ??? Chest pain, mid sternal   ??? Acute midline thoracic back pain   ??? Hyperkalemia   ??? COPD (chronic obstructive pulmonary disease) (CMS-HCC)   ??? Chronic pancreatitis (CMS-HCC)   ??? 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 Cough   ??? Fever and chills   ??? Acute alcoholic gastritis without hemorrhage   ??? Alcohol withdrawal syndrome without complication (CMS-HCC)     Past Surgical History:   Procedure Laterality Date   ??? IR INSERT PORT AGE GREATER THAN 5 YRS  07/15/2018    IR INSERT PORT AGE GREATER THAN 5 YRS 07/15/2018 Carolin Coy, MD IMG V[facility]   ??? PR COLSC FLX W/RMVL OF TUMOR POLYP LESION SNARE TQ N/A 08/16/2015    Procedure: COLONOSCOPY FLEX; W/REMOV TUMOR/LES BY SNARE;  Surgeon: Gwen Pounds, MD;  Location: [facility] PROC[facility];  Service: Gastroenterology   ??? PR COLSC FLX WITH DIRECTED SUBMUCOSAL NJX ANY SBST N/A 08/16/2015    Procedure: COLONOSCOPY, FLEXIBLE, PROXIMAL TO SPLENIC FLEXURE; WITH DIRECTED SUBMUCOSAL INJECTION(S), ANY SUBSTANCE;  Surgeon: Gwen Pounds, MD;  Location: [facility];  Service: Gastroenterology   ??? PROSTATE SURGERY       No current facility-administered medications for this encounter.     Current Outpatient Medications:   ???  abiraterone, submicronized 125 mg Tab, Take 4 tablets (500 mg) by mouth daily. (Patient not taking: Reported on 01/14/2020), Disp: 120 tablet, Rfl: 11  ???  albuterol HFA 90 mcg/actuation inhaler, INHALE 2 PUFFS EVERY 6 HOURS AS NEEDED FOR WHEEZING, Disp: 8.5 g, Rfl: 0 ???  amLODIPine (NORVASC) 10 MG tablet, TAKE 1 TABLET(10 MG) BY MOUTH DAILY, Disp: 30 tablet, Rfl: 11  ???  busPIRone (BUSPAR) 5 MG tablet, TAKE 1 TABLET BY MOUTH THREE TIMES A DAY, Disp: 270 tablet, Rfl: 3  ???  calcium carbonate 650 mg calcium (1,625 mg) tablet, Take 1 tablet (650 mg of elem calcium total) by mouth daily., Disp: 30 tablet, Rfl: 11  ???  diclofenac sodium (VOLTAREN) 1 % gel, Apply 2 g topically Four (4) times a day. Any area of pain, Disp: 200 g, Rfl: 5  ???  diphenhydrAMINE (BENADRYL) 25 mg tablet, Take 0.5 tablets (12.5 mg total) by mouth every six (6) hours as needed for itching., Disp: 30 capsule, Rfl: 0  ???  diphenhydramine HCl (MAGIC MOUTHWASH ORAL) suspension, Take 10 mL by mouth 4 (  four) times a day as needed., Disp: 120 mL, Rfl: 1  ???  famotidine (PEPCID) 20 MG tablet, Take 1 tablet (20 mg total) by mouth two (2) times a day as needed for heartburn., Disp: 60 tablet, Rfl: 0  ???  fentaNYL (DURAGESIC) 50 mcg/hr patch, Place 1 patch on the skin every third day., Disp: 10 patch, Rfl: 0  ???  gabapentin (NEURONTIN) 100 MG capsule, Take 1 capsule (100 mg total) by mouth at bedtime., Disp: 90 capsule, Rfl: 0  ???  megestroL (MEGACE) 400 mg/10 mL (40 mg/mL) suspension, Take 10 mL (400 mg total) by mouth Two (2) times a day. (Patient not taking: Reported on 01/14/2020), Disp: 480 mL, Rfl: 12  ???  melatonin 3 mg Tab, TK 1 T PO HS PRN, Disp: , Rfl:   ???  methylPREDNISolone (MEDROL) 4 MG tablet, Take 1 tablet (4 mg total) by mouth Two (2) times a day. Take when on abiraterone., Disp: 60 tablet, Rfl: 11  ???  MULTIVITAMIN 50 PLUS Tab, TK 1 T PO D, Disp: , Rfl:   ???  nicotine (NICODERM CQ) 14 mg/24 hr patch, Place 1 patch on the skin daily. (Patient not taking: Reported on 01/14/2020), Disp: 28 patch, Rfl: 0  ???  omeprazole (PRILOSEC) 20 MG capsule, Take 2 capsules by mouth Two (2) times a day., Disp: , Rfl:   ???  oxyCODONE (ROXICODONE) 10 mg immediate release tablet, Take 1 tablet (10 mg total) by mouth every four (4) hours as needed for pain., Disp: 150 tablet, Rfl: 0  ???  pancrelipase, Lip-Prot-Amyl, (CREON) 24,000-76,000 -120,000 unit CpDR delayed release capsule, Take 2 capsules (48,000 units of lipase total) by mouth Three (3) times a day with a meal. Take an additional 1 capsule with snacks., Disp: 540 each, Rfl: 3  ???  polyethylene glycol (MIRALAX) 17 gram/dose powder, Take 17 g by mouth daily., Disp: 595 g, Rfl: 11  ???  predniSONE (DELTASONE) 5 MG tablet, Take 1 tablet (5 mg total) by mouth Two (2) times a day., Disp: 60 tablet, Rfl: 5  ???  prochlorperazine (COMPAZINE) 10 MG tablet, TAKE 1 TABLET(10 MG) BY MOUTH EVERY 8 HOURS AS NEEDED, Disp: 30 tablet, Rfl: 1  ???  salmeteroL (SEREVENT) 50 mcg/dose diskus inhaler, Inhale 1 puff Two (2) times a day., Disp: 1 Inhaler, Rfl: 12  ???  senna (SENOKOT) 8.6 mg tablet, Take 2 tablets by mouth Two (2) times a day. As neeeded to have at least 1 BM daily, Disp: 60 tablet, Rfl: 2  ???  tiotropium (SPIRIVA WITH HANDIHALER) 18 mcg inhalation capsule, Place 1 capsule (18 mcg total) into inhaler and inhale once daily., Disp: 90 capsule, Rfl: 3  ???  traZODone (DESYREL) 100 MG tablet, Take 1 tablet (100 mg total) by mouth nightly as needed for sleep., Disp: 90 tablet, Rfl: 3  ???  valACYclovir (VALTREX) 500 MG tablet, Take 1 tablet (500 mg total) by mouth daily. (Patient not taking: Reported on 01/14/2020), Disp: 90 tablet, Rfl: 3     Allergies  Penicillins and Tramadol  Family History   Problem Relation Age of Onset   ??? Heart disease Mother    ??? Diabetes Mother    ??? Diabetes Father    ??? Melanoma Neg Hx    ??? Basal cell carcinoma Neg Hx    ??? Squamous cell carcinoma Neg Hx      Social History  Social History     Tobacco Use   ??? Smoking status: Current Some Day  Smoker     Packs/day: 0.50     Years: 40.00     Pack years: 20.00     Types: Cigarettes     Start date: 05/09/1994   ??? Smokeless tobacco: Never Used   Substance Use Topics   ??? Alcohol use: Yes     Comment: Pt last drank 6 months   ??? Drug use: Yes     Types: Marijuana     Review of Systems:  A 12 point review of systems was negative except for pertinent items noted in the HPI.   Constitutional: No fever  Eyes: No eye drainage  HENT: No runny nose  Cardiovascular: No chest pain  Respiratory: No shortness of breath  Gastrointestinal: No vomiting or diarrhea  Genitourinary: No dysuria  Musculoskeletal:  No leg swelling  Skin: No rashes  Allergic/Immunologic: No hives  Neurological: No slurred speech    Physical Exam     This provider entered the patient's room: YES:    ? The following was PPE worn: N95, gloves, goggles    ED Triage Vitals [01/14/20 0923]   Enc Vitals Group      BP 125/72      Heart Rate 90      SpO2 Pulse       Resp 18      Temp 35.9 ??C (96.6 ??F)      Temp Source Oral      SpO2 99 %     Constitutional: Alert and oriented x3. Appears stated age, in no acute distress.  Eyes: Conjunctivae are normal.  ENT       Head: Normocephalic and atraumatic.       Nose: No congestion.       Mouth/Throat: Mucous membranes are moist.       Neck: No stridor.  Cardiovascular: Normal rate, regular rhythm. Normal and symmetric distal pulses are present in all extremities.  Respiratory: Normal respiratory effort. Breath sounds are normal.  Gastrointestinal: Soft and nontender.   Musculoskeletal: Normal range of motion in all extremities.       Right lower leg: No tenderness or edema.       Left lower leg: No tenderness or edema.  Neurologic: Normal speech and language. No gross focal neurologic deficits are appreciated.  Skin: Skin is warm, dry and intact. No rash noted.  Psychiatric: Mood and affect are normal. Speech and behavior are normal.    ______________________________________________________   Documentation assistance was provided by Eliezer Mccoy, Scribe, on January 14, 2020 at 9:18 AM for Vertell Novak, MD.     A scribe was used when documenting this visit. I agree with the above documentation. Signed by  Dionne Bucy on  January 15, 2020 at 11:49 AM Dionne Bucy, MD  Resident  01/15/20 [phone]

## 2020-01-14 NOTE — Unmapped (Signed)
Internal Medicine Phone Visit    This visit is conducted via telephone.    Contact Information  Person Contacted: Patient  Contact Phone number: [phone] (home)   Is there someone else in the room? No.   Patient agreed to a phone visit    [name] is a 59 y.o. male  participating in a telephone encounter.    Reason for Call  Follow up chronic medical conditions, ED visit 01/14/20    Subjective:  [name] is a 59 yo M with hx of hypertension, Etoh abuse, stage IV prostate cancer, chronic pancreatitis, pancreatic insufficiency who presents for follow-up.    I last spoke with [name] on 11/11. During that visit with started a 14mg /24hr nicotine patch with plan to cut in half after 1 month. Since that time was seen in his oncologist's clinic on 1/6 (3 weeks ago) and noted to have worsening clinical deterioration thought to be due to his metastatic prostate ca (worsening pain, rising ALP, rising PSA). They switched from xtandi to abiraterone. Fentanyl patch was increased as well.    Since that time, patient notes 2 weeks of worsening pain, numbness, and tingling of his L thigh. Was seen in the ED today. Exam and evaluation benign and discharged home.    This afternoon, patient describes 2-3 weeks of paraesthesias in his L anterior thigh with some radiation to lower back. Pain worse at night, burning/itching, and associated tingling. Denies associated weakness, or bowel/urinary incontinence. No saddle anesthesia.     Patient ctns to deny current EtOH use. Has remained abstinent of EtOH for the last several months. Still smoking tobacco, never used the nicotine patches. Since quitting Etoh, pt has less interest in quitting tobacco.    COPD under control. No symptoms.    I have reviewed the problem list, medications, and allergies and have updated/reconciled them if needed.    Objective:  Comfortably, NAD, no increased WOB on phone.    Assessment & Plan:  1. 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 No red flag symptoms c/f spinal cord compression. Therefore, will hold off on imaging. Given pain is worse at night, and neuro[pathyic will perscribe low dose nightly gabapentin. Patient is on significant amounts of opioids and has a recent hx of EtOH abuse requiring multiple hospitalizations for withdrawals in the last 6 months. Counseled patient on dangers of mixing opioids, gabapentin, and EtOH and increased risk of overdose and death. Pt stated he understood and will remain abstinent from drinking/  - gabapentin 100mg  at bedtime  - planned f/u at Oncologist Dr. Claude Manges in < 1 month for continued management of pain.    2. Chronic obstructive pulmonary disease, unspecified COPD type (CMS-HCC)    - refill spiriva and salmeterol  - due for spirometry. Will wait until ACC spiro has been resolved. Otherwise, non-urgent.    Jamila Slatten Jeri Cos, MD PGY-3  Internal Medicine     Return in 3 months (on 04/13/2020) for Recheck.     Staffed with: Dr. Lacie Draft    I spent 15 minutes on the phone with the patient on the date of service. I spent an additional 5 minutes on pre- and post-visit activities.     The patient was physically located in [place] or a state in which I am permitted to provide care. The patient and/or parent/guardian understood that s/he may incur co-pays and cost sharing, and agreed to the telemedicine visit. The  visit was reasonable and appropriate under the circumstances given the patient's presentation at the time.    The patient and/or parent/guardian has been advised of the potential risks and limitations of this mode of treatment (including, but not limited to, the absence of in-person examination) and has agreed to be treated using telemedicine. The patient's/patient's family's questions regarding telemedicine have been answered.     If the visit was completed in an ambulatory setting, the patient and/or parent/guardian has also been advised to contact their provider???s office for worsening conditions, and seek emergency medical treatment and/or call 911 if the patient deems either necessary.

## 2020-01-17 NOTE — Unmapped (Signed)
Thank you for engaging in telemedicine.    We prescribed gabapentin for nerve pain. Please take this only at night.

## 2020-01-24 DIAGNOSIS — C61 Malignant neoplasm of prostate: Principal | ICD-10-CM

## 2020-01-24 MED ORDER — OXYCODONE 10 MG TABLET
ORAL_TABLET | ORAL | 0 refills | 25 days | Status: CP | PRN
Start: 2020-01-24 — End: ?

## 2020-01-24 NOTE — Unmapped (Signed)
Hi,    Patient Joseph Phillips called requesting a medication refill for the following:    ? Medication: Oxycodone  ? Dosage: 10mg   ? Days left of medication: 0  ? Pharmacy: [facility]         The expected turnaround time is 3-4 business days       Check Indicates criteria has been reviewed and confirmed with the patient:    [x]  Preferred Name   [x]  DOB and/or MR#  [x]  Preferred Contact Method  [x]  Phone Number(s)   [x]  Preferred Pharmacy   []  MyChart     Thank you,  Jacques Navy  [facility]  [phone]

## 2020-01-28 NOTE — Unmapped (Signed)
[facility] Refill Coordination Note    Specialty Medication(s) to be Shipped:   General Specialty: YONSA    Other medication(s) to be shipped: METHYLPREDNISOLONE 4MG       Joseph Phillips, DOB: September 29, 1961  Phone: [phone] (home)       All above HIPAA information was verified with patient.     Was a Nurse, learning disability used for this call? No    Completed refill call assessment today to schedule patient's medication shipment from the [facility] [phone]).       Specialty medication(s) and dose(s) confirmed: Regimen is correct and unchanged.   Changes to medications: [name] reports no changes at this time.  Changes to insurance: No  Questions for the pharmacist: No    Confirmed patient received Welcome Packet with first shipment. The patient will receive a drug information handout for each medication shipped and additional FDA Medication Guides as required.       DISEASE/MEDICATION-SPECIFIC INFORMATION        N/A    SPECIALTY MEDICATION ADHERENCE     Medication Adherence    Patient reported X missed doses in the last month: 0  Specialty Medication: YONSA  Patient is on additional specialty medications: No  Informant: patient  Support network for adherence: family member  Confirmed plan for next specialty medication refill: delivery by pharmacy  Refills needed for supportive medications: not needed          Refill Coordination    Has the Patients' Contact Information Changed: No  Is the Shipping Address Different: No           YONSA 125 mg: 7 days of medicine on hand         SHIPPING     Shipping address confirmed in Epic.     Delivery Scheduled: Yes, Expected medication delivery date: 2/9.     Medication will be delivered via UPS to the prescription address in Epic WAM.    [name]   [facility] Specialty Technician

## 2020-01-31 DIAGNOSIS — J449 Chronic obstructive pulmonary disease, unspecified: Principal | ICD-10-CM

## 2020-01-31 MED FILL — METHYLPREDNISOLONE 4 MG TABLET: ORAL | 30 days supply | Qty: 60 | Fill #2

## 2020-01-31 MED FILL — YONSA 125 MG TABLET: ORAL | 30 days supply | Qty: 120 | Fill #2

## 2020-01-31 MED FILL — YONSA 125 MG TABLET: 30 days supply | Qty: 120 | Fill #2 | Status: AC

## 2020-01-31 MED FILL — METHYLPREDNISOLONE 4 MG TABLET: 30 days supply | Qty: 60 | Fill #2 | Status: AC

## 2020-01-31 NOTE — Unmapped (Signed)
A medication refill request has been received to the clinic and routed to Dr. Claude Manges for review pertaining to the following:    ?? Omeprazole 20 mg capsules, dispense 90 with 1 refills    ?? Albuterol 90 mcg inhaler, dispense 1 with 1 refill.    ?? If approved, these requests were submitted electronically and sent to [facility] on [address] in [place], [place].    ?? Follow-up with Dr. Claude Manges remains tentatively set for 02/08/2020.    No other actions taken at this time.

## 2020-01-31 NOTE — Unmapped (Signed)
Hi,    Patient Joseph Phillips called requesting a medication refill for the following:    ? Medication: Omeprazole  ? Dosage: 20mg   ? Days left of medication: 0  ? Pharmacy: [facility]    ? Medication: Albuterol HFA   ? Dosage: 90mg   ? Days left of medication: 0  ? Pharmacy: [facility]        The expected turnaround time is 3-4 business days       Check Indicates criteria has been reviewed and confirmed with the patient:    [x]  Preferred Name   [x]  DOB and/or MR#  [x]  Preferred Contact Method  [x]  Phone Number(s)   [x]  Preferred Pharmacy   []  MyChart     Thank you,  Jacques Navy  [facility]  [phone]

## 2020-02-01 DIAGNOSIS — J449 Chronic obstructive pulmonary disease, unspecified: Principal | ICD-10-CM

## 2020-02-01 MED ORDER — OMEPRAZOLE 20 MG CAPSULE,DELAYED RELEASE: 40 mg | capsule | Freq: Two times a day (BID) | 1 refills | 23 days | Status: AC

## 2020-02-01 MED ORDER — ALBUTEROL SULFATE HFA 90 MCG/ACTUATION AEROSOL INHALER: g | 0 refills | 0 days | Status: AC

## 2020-02-01 MED ORDER — ALBUTEROL SULFATE HFA 90 MCG/ACTUATION AEROSOL INHALER
0 refills | 0.00000 days | Status: CP
Start: 2020-02-01 — End: 2020-02-01

## 2020-02-01 MED ORDER — OMEPRAZOLE 20 MG CAPSULE,DELAYED RELEASE
ORAL_CAPSULE | Freq: Two times a day (BID) | ORAL | 1 refills | 23.00000 days | Status: CP
Start: 2020-02-01 — End: 2020-02-01

## 2020-02-03 NOTE — Unmapped (Addendum)
Initial Visit Note    Patient Name: Joseph Phillips  Patient Age: 59 y.o.  Encounter Date: 02/08/2020    Referring Physician:     Unknown Foley, MD  [address]  [place],  [place] [address]    Primary Care Provider:  Unknown Foley, MD    Assessment/Plan:    Reason for visit:  Prostate Cancer    Cancer Staging  Prostate cancer (CMS-HCC)  Staging form: Prostate, AJCC 7th Edition  - Clinical: Stage IV (T3a, N0, M1b, PSA: Less than 10, Gleason 7) - Unsigned      --Prostate cancer, stage IV,      -- with progression on Lupron/Zytiga     --07/14/18: Docetaxel/prednisone     --11/11/2018: Neutropenic fever, with possible UTI and oral mucositis     -- 02/03/19: [name];  Held 03/01/19 for LE swelling/cellulitis; restart 03/29/19 at 80 mg qD     -- 08/16/19:  [name] increased to 160 mg daily  --Hypokalemia  --Hypomagnesemia  -- Alcoholism      -- 06/22/19: Admitted for alcoholic gastritis  --Leg cramps and pain, exacerbated by electrolyte disturbances      --03/01/19: Skin changes in the lower extremities of uncertain etiology.  --Teeth in poor repair, awaiting dental correction  --Hypertension  --Lower extremity swelling, exacerbated at night; improved on triamterene-HCTZ  -- OSA; positive study for both central and obstructive apnea; CPAP trial recommended, 07/23/2018; deferred by the patient  --History of pancreatitis with pancreatic insufficiency; on Creon; 2/2 EtOHism      Recommendation  --Fentanyl will be increased from 50 mcg/h to 100 mcg/h; he will remain on oxycodone 10 mg every 4 hours as needed.  --The patient had stopped taking calcium supplements; he will restart calcium carbonate 650 mg twice daily, will vitamin D3 50,000 units weekly  --He received Lupron today.  --The patient appears to be failing therapy, although he reports feeling better today and his brother confirms that his appetite and energy have improved in the last several weeks.  We discussed treatment options (radium 223, mitoxantrone, cabazitaxel; Provenge would be contraindicated given the burden of disease and symptoms; is not a candidate for pembrolizumab (MS???as), or PARP inhibitor therapy.  --Plan return in 1 month for reevaluation and ongoing treatment.  --45 minutes of face-to-face consultation and chart preparation occurred.    Addenda: PSA has risen to 750.  Case was discussed with Dr. Griffin Basil, who will assume management the patient.  He would like all extensive molecular testing done, we will try to send tumor to foundation for this purpose.  He also requested CT scans and bone scan at this time.      I have reviewed the laboratory, pathology, and radiology reports in detail and discussed findings with the patient and his brother.    History of Present Illness:     Joseph Phillips is a 59 y.o. male who is seen in consultation at the request of Unknown Foley, MD for an evaluation of Prostate Cancer.  Joseph Phillips returns for treatment.  He was having increased pain in his lower back radiating to his legs yesterday.  The pain was severe enough that he had to go to the emergency room.  They gave him some extra pain medication (morphine), and checked his alkaline phosphatase (which gone up a little bit since January) as well as plain films of the thoracic and lumbar spine and pelvis (which showed stable sclerotic changes compared to the October 2020 CT scan), and  also plain films of the shoulder which showed some new sclerotic changes since 2018 without fracture.  The patient is feeling better today except for his pain.  He reports that overall his energy and appetite have been improved in the past month.  He is currently taking fentanyl 50 mcg every 3 days and oxycodone 10 mg about 5 times a day.  He gets pain relief with the oxycodone but the pain is back before his next dose.  Patient reports that his bowels and bladder are working without difficulty.  He denies shortness of breath, sweats, headaches, seizures.  He has some numbness in his toes but not in the legs themselves.    Oncology History:  Patient was originally diagnosed with prostate cancer in 2006.  He underwent robot-assisted prostatectomy.  He did well until May 2018, when his PSA rose to a high of about 45, and he was started on Lupron, initially with Casodex, and then with apparatus.  His PSA fell and remained low until recently when he was noted to be rising from a low of 0.82 on  01/01/18, to 3.99 on 06/29/2018.  He was instructed to hold Zytiga.  Since holding Zytiga he is noted swelling in his ankles.  The swelling comes on overnight, improves during the course the day.  He was evaluated with Doppler studies on 06/29/2018 which showed no evidence of blood clots.  He is still bothered by the swelling.  Restaging studies done on 06/04/2018 including bone scan, and CT of the chest abdomen pelvis, showed sclerotic changes throughout the axial skeleton, and involvement also of scapula, some ribs and pelvis.  The sites were new since 2016, and also probably new since the CT stone study in May 2018.  Bone scan also showed some progression in disease by report, compared to 2018.  He has developed pain in the back of his calves, for which he takes oxycodone (Tylenol and ibuprofen were ineffective).  The pain followed prolonged episodes of cramping, felt to be related to low magnesium and potassium.  About 6 months ago he had shingles in a left L2 distribution.        Oncology History   Prostate cancer (CMS-HCC)   12/02/2008 Biopsy    Prostate, left apex, needle biopsy  - Adenocarcinoma, Gleason combined score 6 (3+3), 2 of 2 cores involved, tumor  approximately 2 mm in greatest diameter, approximately 10% of tissue involved by  carcinoma.  - See comment    E: ??Prostate, left mid, needle biopsy  - Adenocarcinoma, Gleason combined score 6 (3+3), 1 of 2 cores involved, tumor  approximately 2 mm in greatest diameter, approximately 5% of tissue involved by  carcinoma.  - See comment    F: ??Prostate, left base, needle biopsy  - Adenocarcinoma, Gleason combined score 6 (3+3), 1 of 2 cores involved, tumor  approximately 1 mm in greatest diameter, approximately 5% of tissue involved by  carcinoma.     01/24/2009 Surgery    Prostate, robot assisted laparoscopic prostatectomy  Tumor histologic type: Prostatic adenocarcinoma  Tumor grade (Gleason system): Gleason pattern 7 (3+4)  Approximate percentage of prostate involved by tumor: 40%  Extent of tumor:  Location of tumor involvement in prostate: right and left lobes, anterior  and posterior, apex to base  Confinement/non-confinement of tumor within the prostatic capsule: ??not  confined  Location of extracapsular tumor involvement: ??left prostate toward the base  (ie. block A22)  Type of extracapsular tissue involved within tumor: ?? adipose  tissue/perineural  tissue  Angiolymphatic space invasion: not identified  Seminal vesicles: negative  Vas deferens: negative  Histologic assessment of surgical margins: positive margin along a 3 mm  face in the left prostate base (block A25)    Other significant findings: High grade prostatic intraepithelial  Neoplasm;  Lymph nodes: Not applicable  AJCC Staging: pT3a pNx     08/30/2013 Initial Diagnosis    Prostate cancer (CMS-HCC)     04/30/2017 -  Chemotherapy    Lupron 22.5 mg; with bicalutamide x  month     08/01/2017 - 06/30/2018 Chemotherapy    Abiaterone 1000 mg daily; decrease to 750 mg daily at 10/2018;     10/30/2017 -  Cancer Staged    Starta: TP53, TMPSSR  -EGR fusion; no other abnormalities identified; MSI-stable; no defects in homologous recombination genes.     01/14/2018 -  Cancer Staged    CT Chest:  Numerous sclerotic metastases of the thoracic spine, left scapula  and multiple ribs.  3. Coronary artery and aortic atherosclerosis (ICD10-I70.0).  4. Previously demonstrated left renal mass is not included in the  field of view.     06/04/2018 -  Cancer Staged    CT CAP:  Scattered diffuse sclerotic metastatic lesions are seen throughout the spine as well as the left scapula; some of the lesions are new since prior CT dated 04/20/2015.  - No intrathoracic metastatic disease.  Interval progression of osseous metastases throughout the spine and pelvis compared to 04/22/2017.  -- Cholelithiasis.  -- Hepatic steatosis.     06/04/2018 -  Cancer Staged    Bone scan:  Increased radiotracer uptake throughout the spine, sacrum, and left ilium, consistent with numerous metastases better evaluated on same-day CT. Unchanged metastasis to the left proximal humerus.     07/14/2018 - 11/12/2018 Chemotherapy    OP PROSTATE DOCETAXEL/PREDNISONE  Docetaxel 75 mg/m2 evry 21 days     01/07/2019 -  Cancer Staged    CTA Chest:  No evidence of pulmonary embolism.  2. Occlusion of the medial basilar segmental bronchus of the left  lower lobe, nonspecific though could be seen in the setting of  aspiration. Otherwise, no acute findings.  3. Apical predominant paraseptal emphysematous change with suspected  mild progression of smoking related fibrosis with subpleural  reticulation but without evidence of honeycombing. Emphysema  (ICD10-J43.9).  4. Progression of osseous metastatic disease since 11/2017. Detailed  above. No definitive pathologic compression fractures.  5. Hepatic steatosis. 6. Coronary artery calcifications. Aortic Atherosclerosis     01/20/2019 -  Cancer Staged    NM Bone scan: Redemonstrated multiple abnormal foci throughout the appendicular axial skeleton, with interval increase in the extent of the T10 vertebral body lesion and new lesions involving the manubrium, left fifth and seventh ribs. This is most consistent with progression of osseous metastatic disease.     02/03/2019 - 12/14/2019 Chemotherapy    Xtandi 160 mg qD; held after 1 month due to swelling.  Restart at 80 mg qD on 03/29/19, then back to 160 mg daily in 05/2019     03/10/2019 -  Cancer Staged    CTA chest:  No evidence of acute aortic intramural hematoma or dissection.  Multifocal osseous sclerotic metastatic disease, progressed when compared to prior CT 06/04/2018 and grossly similar to most recent nuclear medicine bone scan.  Mucoid impacted airways in left lower lobe with adjacent subtle ground glass opacities; likely represents sequelae of aspiration. Recommend follow up CT in 6 to 8 weeks to  ensure clearing.  Other chronic/incidental findings as above, not substantially changed.     09/02/2019 -  Cancer Staged    CTA Chest:  Interval increased sclerosis of diffuse osseous metastatic disease compared to 03/10/2019, likely reflects treatment related changes         09/07/2019 Endocrine/Hormone Therapy    OP LEUPROLIDE (7.5 MG EVERY MONTH)  Plan Provider: Towanda Malkin, MD     10/06/2019 -  Cancer Staged    CT AP:  Diffuse sclerotic metastases throughout the visualized skeleton, similar to prior exam.  No adenopathy. Prostate surgically absent     12/14/2019 -  Chemotherapy    Abiraterone, submicronized 500 mg daily, prednisone 5 mg twice daily         Past Medical History:   Diagnosis Date   ??? Alcohol abuse    ??? COPD (chronic obstructive pulmonary disease) (CMS-HCC)    ??? Hx of radiation therapy    ??? Hypertension    ??? Polysubstance abuse (CMS-HCC)    ??? Prostate cancer (CMS-HCC)    ??? Seizures (CMS-HCC)       Past Surgical History:   Procedure Laterality Date   ??? IR INSERT PORT AGE GREATER THAN 5 YRS  07/15/2018    IR INSERT PORT AGE GREATER THAN 5 YRS 07/15/2018 Carolin Coy, MD [facility]   ??? PR COLSC FLX W/RMVL OF TUMOR POLYP LESION SNARE TQ N/A 08/16/2015    Procedure: COLONOSCOPY FLEX; W/REMOV TUMOR/LES BY SNARE;  Surgeon: Gwen Pounds, MD;  Location: [facility];  Service: Gastroenterology   ??? PR COLSC FLX WITH DIRECTED SUBMUCOSAL NJX ANY SBST N/A 08/16/2015    Procedure: COLONOSCOPY, FLEXIBLE, PROXIMAL TO SPLENIC FLEXURE; WITH DIRECTED SUBMUCOSAL INJECTION(S), ANY SUBSTANCE;  Surgeon: Gwen Pounds, MD;  Location: [facility];  Service: Gastroenterology ??? PROSTATE SURGERY          Family History   Problem Relation Age of Onset   ??? Heart disease Mother    ??? Diabetes Mother    ??? Diabetes Father    ??? Melanoma Neg Hx    ??? Basal cell carcinoma Neg Hx    ??? Squamous cell carcinoma Neg Hx       Family Status   Relation Name Status   ??? Mother  (Not Specified)   ??? Father  (Not Specified)   ??? Neg Hx  (Not Specified)     Social History     Occupational History   ??? Occupation: DISABLED   Tobacco Use   ??? Smoking status: Current Some Day Smoker     Packs/day: 0.50     Years: 40.00     Pack years: 20.00     Types: Cigarettes     Start date: 05/09/1994   ??? Smokeless tobacco: Never Used   Substance and Sexual Activity   ??? Alcohol use: Yes     Comment: Pt last drank 6 months   ??? Drug use: Yes     Types: Marijuana   ??? Sexual activity: Not Currently       Allergies   Allergen Reactions   ??? Penicillins      Other reaction(s): UNKNOWN   ??? Tramadol Dizziness       Current Outpatient Medications   Medication Sig Dispense Refill   ??? albuterol HFA 90 mcg/actuation inhaler INHALE 2 PUFFS BY MOUTH EVERY 6 HOURS AS NEEDED FOR WHEEZING 25.5 g 0   ??? amLODIPine (NORVASC) 10 MG tablet TAKE  1 TABLET(10 MG) BY MOUTH DAILY 30 tablet 11   ??? busPIRone (BUSPAR) 5 MG tablet TAKE 1 TABLET BY MOUTH THREE TIMES A DAY 270 tablet 3   ??? calcium carbonate 650 mg calcium (1,625 mg) tablet Take 1 tablet (650 mg of elem calcium total) by mouth Two (2) times a day. 60 tablet 11   ??? diclofenac sodium (VOLTAREN) 1 % gel Apply 2 g topically Four (4) times a day. Any area of pain 200 g 5   ??? diphenhydrAMINE (BENADRYL) 25 mg tablet Take 0.5 tablets (12.5 mg total) by mouth every six (6) hours as needed for itching. 30 capsule 0   ??? diphenhydramine HCl (MAGIC MOUTHWASH ORAL) suspension Take 10 mL by mouth 4 (four) times a day as needed. 120 mL 1   ??? famotidine (PEPCID) 20 MG tablet Take 1 tablet (20 mg total) by mouth two (2) times a day as needed for heartburn. 60 tablet 0   ??? gabapentin (NEURONTIN) 100 MG capsule Take 1 capsule (100 mg total) by mouth at bedtime. 90 capsule 0   ??? melatonin 3 mg Tab TK 1 T PO HS PRN     ??? MULTIVITAMIN 50 PLUS Tab TK 1 T PO D     ??? omeprazole (PRILOSEC) 20 MG capsule TAKE 2 CAPSULES(40 MG) BY MOUTH TWICE DAILY 368 capsule 0   ??? oxyCODONE (ROXICODONE) 10 mg immediate release tablet Take 1 tablet (10 mg total) by mouth every four (4) hours as needed for pain. May increase to 2 tablets if need be 200 tablet 0   ??? pancrelipase, Lip-Prot-Amyl, (CREON) 24,000-76,000 -120,000 unit CpDR delayed release capsule Take 2 capsules (48,000 units of lipase total) by mouth Three (3) times a day with a meal. Take an additional 1 capsule with snacks. 540 each 3   ??? polyethylene glycol (MIRALAX) 17 gram/dose powder Take 17 g by mouth daily. 595 g 11   ??? predniSONE (DELTASONE) 5 MG tablet Take 1 tablet (5 mg total) by mouth Two (2) times a day. 60 tablet 5   ??? salmeteroL (SEREVENT) 50 mcg/dose diskus inhaler Inhale 1 puff Two (2) times a day. 1 Inhaler 12   ??? tiotropium (SPIRIVA WITH HANDIHALER) 18 mcg inhalation capsule Place 1 capsule (18 mcg total) into inhaler and inhale once daily. 90 capsule 3   ??? traZODone (DESYREL) 100 MG tablet Take 1 tablet (100 mg total) by mouth nightly as needed for sleep. 90 tablet 3   ??? valACYclovir (VALTREX) 500 MG tablet Take 1 tablet (500 mg total) by mouth daily. 90 tablet 3   ??? abiraterone, submicronized 125 mg Tab Take 4 tablets (500 mg) by mouth daily. (Patient not taking: Reported on 01/14/2020) 120 tablet 11   ??? cholecalciferol, vitamin D3, 250 mcg (10,000 unit) capsule Take 50,000 Units by mouth once a week. 12 capsule 3   ??? fentaNYL (DURAGESIC) 100 mcg/hr patch Place 1 patch on the skin every third day. 10 patch 0   ??? megestroL (MEGACE) 400 mg/10 mL (40 mg/mL) suspension Take 10 mL (400 mg total) by mouth Two (2) times a day. (Patient not taking: Reported on 01/14/2020) 480 mL 12   ??? methylPREDNISolone (MEDROL) 4 MG tablet Take 1 tablet (4 mg total) by mouth Two (2) times a day. Take when on abiraterone. (Patient not taking: Reported on 02/08/2020) 60 tablet 11   ??? nicotine (NICODERM CQ) 14 mg/24 hr patch Place 1 patch on the skin daily. (Patient not taking: Reported on 01/14/2020) 28  patch 0   ??? prochlorperazine (COMPAZINE) 10 MG tablet TAKE 1 TABLET(10 MG) BY MOUTH EVERY 8 HOURS AS NEEDED (Patient not taking: Reported on 02/08/2020) 30 tablet 1   ??? senna (SENOKOT) 8.6 mg tablet Take 2 tablets by mouth Two (2) times a day. As neeeded to have at least 1 BM daily (Patient not taking: Reported on 02/08/2020) 60 tablet 2     No current facility-administered medications for this visit.        The following portions of the patient's history were reviewed and updated as appropriate: allergies, current medications, past family history, past medical history, past social history, past surgical history and problem list.     Review of Systems   Constitutional: Positive for fatigue. Negative for appetite change, chills, diaphoresis, fever and unexpected weight change.   HENT:   Negative for mouth sores, nosebleeds and trouble swallowing.    Eyes: Negative for eye problems.   Respiratory: Negative for chest tightness, cough, hemoptysis and shortness of breath.    Cardiovascular: Negative for chest pain and leg swelling.   Gastrointestinal: Positive for abdominal pain, diarrhea, nausea and vomiting. Negative for blood in stool and constipation.   Genitourinary: Negative for difficulty urinating and hematuria.    Musculoskeletal: Positive for back pain and myalgias. Negative for arthralgias.   Skin: Negative for rash.   Neurological: Negative for extremity weakness, headaches and seizures.   Hematological: Negative for adenopathy.   Psychiatric/Behavioral: Negative for confusion.   All other systems reviewed and are negative.        Vital Signs for this encounter:    BSA: 1.76 meters squared  BP 130/64  - Pulse 76  - Temp (!) 38 ??C (100.4 ??F) (Temporal)  - Resp 18  - Wt 64.3 kg (141 lb 10.3 oz)  - SpO2 100% - BMI 21.22 kg/m??          Physical Exam   Constitutional: He is oriented to person, place, and time.   HENT:   Head: Normocephalic.   Eyes: Pupils are equal, round, and reactive to light. EOM are normal.   Cardiovascular: Normal rate, regular rhythm and normal heart sounds.   Pulmonary/Chest: Effort normal and breath sounds normal.   Abdominal: Soft. Bowel sounds are normal. He exhibits no distension and no mass. There is no abdominal tenderness.   Musculoskeletal:         General: No tenderness.      Comments: Tenderness to palpation over the lower thoracic and lumbar spines, and to a lesser degree, elsewhere in the back.   Lymphadenopathy:     He has no cervical adenopathy.   No adenopathy neck, supraclavicular, axillary inguinal regions.   Neurological: He is alert and oriented to person, place, and time.   Skin: No rash noted.   Psychiatric: He has a normal mood and affect. His behavior is normal.       Karnofsky/Lansky Performance Status  80, Normal activity with effort; some signs or symptoms of disease (ECOG equivalent 1)    Results:    WBC   Date Value Ref Range Status   02/08/2020 6.9 3.5 - 10.5 10*9/L Final   04/17/2012 7.6 4.5 - 11.0 x10 9th/L Final     HGB   Date Value Ref Range Status   02/08/2020 8.2 (L) 13.5 - 17.5 g/dL Final   09/81/1914 78.2 13.5 - 17.5 G/DL Final     HCT   Date Value Ref Range Status   02/08/2020 24.1 (L) 38.0 -  50.0 % Final   04/17/2012 39.2 (L) 41.0 - 53.0 % Final     Platelet   Date Value Ref Range Status   02/08/2020 221 150 - 450 10*9/L Final   04/17/2012 242 150 - 440 x10 9th/L Final     LDH   Date Value Ref Range Status   05/27/2019 763 (H) 338 - 610 U/L Final     Creatinine Whole Blood, POC   Date Value Ref Range Status   07/25/2014 1.6 (H) 0.8 - 1.4 mg/dL Final     Creatinine, Whole Blood   Date Value Ref Range Status   07/25/2014 1.4 0.8 - 1.4 mg/dL Final     Creatinine   Date Value Ref Range Status   02/08/2020 0.63 (L) 0.70 - 1.30 mg/dL Final   96/03/5408 8.11 0.70 - 1.30 mg/dL Final     AST   Date Value Ref Range Status   02/08/2020 23 19 - 55 U/L Final   09/11/2011 38 19 - 55 U/L Final     PSA Diagnostic   Date Value Ref Range Status   09/28/2014 4.4 (H) 0.0 - 4.0 ng/mL Final   05/30/2014 5.0 (H) 0.0 - 4.0 ng/mL Final   03/23/2014 4.6 (H) 0.0 - 4.0 ng/mL Final   10/04/2013 3.7 0.0 - 4.0 ng/mL Final   06/24/2013 2.6 0.0 - 4.0 ng/mL Final   01/18/2013 1.6 0.0 - 4.0 NG/ML Final   09/28/2012 1.5 0.0 - 4.0 NG/ML Final   07/03/2012 1.0 0.0 - 4.0 NG/ML Final   12/30/2011 0.5 0.0 - 4.0 NG/ML Final   07/15/2011 0.3 0.0 - 4.0 NG/ML Final   04/08/2011 0.3 0.0 - 4.0 NG/ML Final

## 2020-02-07 ENCOUNTER — Ambulatory Visit: Admit: 2020-02-07 | Discharge: 2020-02-07 | Disposition: A | Discharge status: Home / Self Care | Payer: MEDICAID

## 2020-02-07 ENCOUNTER — Emergency Department: Admit: 2020-02-07 | Discharge: 2020-02-07 | Disposition: A | Discharge status: Home / Self Care | Payer: MEDICAID

## 2020-02-07 DIAGNOSIS — G893 Neoplasm related pain (acute) (chronic): Principal | ICD-10-CM

## 2020-02-07 DIAGNOSIS — C7951 Secondary malignant neoplasm of bone: Principal | ICD-10-CM

## 2020-02-07 DIAGNOSIS — G8929 Other chronic pain: Principal | ICD-10-CM

## 2020-02-07 DIAGNOSIS — M549 Dorsalgia, unspecified: Principal | ICD-10-CM

## 2020-02-07 DIAGNOSIS — C61 Malignant neoplasm of prostate: Principal | ICD-10-CM

## 2020-02-07 DIAGNOSIS — M25512 Pain in left shoulder: Principal | ICD-10-CM

## 2020-02-07 DIAGNOSIS — R531 Weakness: Principal | ICD-10-CM

## 2020-02-07 LAB — CBC W/ AUTO DIFF
BASOPHILS ABSOLUTE COUNT: 0.1 10*9/L (ref 0.0–0.1)
BASOPHILS RELATIVE PERCENT: 0.8 %
EOSINOPHILS RELATIVE PERCENT: 0.3 %
HEMATOCRIT: 25.6 % — ABNORMAL LOW (ref 38.0–50.0)
HEMOGLOBIN: 8.8 g/dL — ABNORMAL LOW (ref 13.5–17.5)
LYMPHOCYTES ABSOLUTE COUNT: 1 10*9/L (ref 0.7–4.0)
LYMPHOCYTES RELATIVE PERCENT: 15.7 %
MEAN CORPUSCULAR HEMOGLOBIN CONC: 34.2 g/dL (ref 30.0–36.0)
MEAN CORPUSCULAR HEMOGLOBIN: 30.3 pg (ref 26.0–34.0)
MEAN CORPUSCULAR VOLUME: 88.5 fL (ref 81.0–95.0)
MEAN PLATELET VOLUME: 7.2 fL (ref 7.0–10.0)
MONOCYTES ABSOLUTE COUNT: 0.5 10*9/L (ref 0.1–1.0)
MONOCYTES RELATIVE PERCENT: 7.1 %
NEUTROPHILS ABSOLUTE COUNT: 5 10*9/L (ref 1.7–7.7)
NEUTROPHILS RELATIVE PERCENT: 76.1 %
PLATELET COUNT: 207 10*9/L (ref 150–450)
RED BLOOD CELL COUNT: 2.89 10*12/L — ABNORMAL LOW (ref 4.32–5.72)
RED CELL DISTRIBUTION WIDTH: 18.2 % — ABNORMAL HIGH (ref 12.0–15.0)

## 2020-02-07 LAB — COMPREHENSIVE METABOLIC PANEL
ALBUMIN: 3.8 g/dL (ref 3.5–5.0)
ALKALINE PHOSPHATASE: 484 U/L — ABNORMAL HIGH (ref 38–126)
ALT (SGPT): 10 U/L (ref <50)
ANION GAP: 12 mmol/L (ref 7–15)
AST (SGOT): 26 U/L (ref 19–55)
BILIRUBIN TOTAL: 0.4 mg/dL (ref 0.0–1.2)
BUN / CREAT RATIO: 37
CALCIUM: 7.7 mg/dL — ABNORMAL LOW (ref 8.5–10.2)
CHLORIDE: 112 mmol/L — ABNORMAL HIGH (ref 98–107)
CO2: 17 mmol/L — ABNORMAL LOW (ref 22.0–30.0)
CREATININE: 0.65 mg/dL — ABNORMAL LOW (ref 0.70–1.30)
EGFR CKD-EPI AA MALE: >90 mL/min/{1.73_m2} (ref >=60)
EGFR CKD-EPI NON-AA MALE: >90 mL/min/{1.73_m2} (ref >=60)
GLUCOSE RANDOM: 125 mg/dL (ref 70–179)
PROTEIN TOTAL: 6.5 g/dL (ref 6.5–8.3)
SODIUM: 141 mmol/L (ref 135–145)

## 2020-02-07 LAB — CLARITY

## 2020-02-07 LAB — URINALYSIS WITH CULTURE REFLEX
BACTERIA: NONE SEEN /HPF
BILIRUBIN UA: NEGATIVE
GLUCOSE UA: NEGATIVE
KETONES UA: NEGATIVE
LEUKOCYTE ESTERASE UA: NEGATIVE
NITRITE UA: NEGATIVE
PH UA: 7.5 (ref 5.0–9.0)
PROTEIN UA: NEGATIVE
RBC UA: 5 /HPF — ABNORMAL HIGH (ref <3)
SQUAMOUS EPITHELIAL: 2 /HPF (ref 0–5)
UROBILINOGEN UA: 0.2
WBC UA: 4 /HPF — ABNORMAL HIGH (ref <2)

## 2020-02-07 LAB — ANION GAP: Anion gap 3:SCnc:Pt:Ser/Plas:Qn:: 12

## 2020-02-07 LAB — BASOPHILS ABSOLUTE COUNT: Basophils:NCnc:Pt:Bld:Qn:Automated count: 0.1

## 2020-02-07 NOTE — Unmapped (Signed)
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 male with a PMH of stage IV prostate cancer s/p prostatectomy (2006) and radiation therapy (currently on Denosumab and Lupron injections, and abiraterone) with bone mets, chronic pancreatitis, COPD, polysubstance abuse (EtOH, tobacco use, and opiates), hypertension, seizures, herpes zoster involving the sacral dermatome, and depression who presents to the ED for progressively worsening pains and weakness in his back, BLE, and BUE in the setting of bone cancer metastasis from stage IV prostate cancer.    VSS on arrival, pt afebrile. Pt chronically ill appearing on exam, in NAD, non-toxic. Heart rate and rhythm is regular. Lungs are clear to auscultation bilaterally. Abdomen is soft and non-tender with no guarding or rebound. No focal neurological deficits. Pt with TTP to the mid-thoracic to upper-lumbar midline spinal region. No TTP to BL upper or lower extremities. He does have ROM of the upper and lower extremities. Good grip strength in BL upper extremities, good plantar and dorsiflexion BLLE strength, does have 3/5 hip flexor strength in BLLE, seems mostly secondary to pain.     Differential includes progressive disease of bone metastasis as cause of pain/weakness vs electrolyte disturbance vs less likely anemia. Seems like weakness is likely 2/2 worsening pain, no signs/sx or exam findings c/w focal weakness so less likely intracranial related. Also consider COPD exacerbation vs less likely new viral infection. Seems less likely infectious given he is currently afebrile, although states has had some intermittent chills. Pt states his worsening pain is his primary complaint that brought him here today.    Discussed the case with primary oncologist via epic chat who states dedicated imaging may be helpful given pt with known progression of metastatic disease. This may be the cause of pt's increased pain. Plan for XR T spine, L spine, pelvis, chest, and left shoulder, and basic labs/UA. Will give morphine 4mg  and reassess.    12:50 PM  Labs and Images are all resulted. Labs reveal H&H 8.8 and 25.6 which is c/w prior labs 1 month ago. BUN/creatinine 24/0.65. Suspect patient my be dry, will start wL IVF. Alk phosphatase elevated to 484.     XR Thoracic Spine 2 Views   Final Result   1. Redemonstrated diffuse osseous metastasis throughout the spine, most pronounced in the lower thoracic spine.   2. Negative for vertebral body height loss or evidence of pathological fracture.      XR Shoulder 3 Or More Views Left   Final Result   Sclerotic metastases seen involving the left glenoid and proximal humerus without evidence of pathological fracture. These are new from 2018.      XR Pelvis 1 Or 2 Views   Final Result   Sclerotic metastases, similar to slightly progressed from comparison CT allowing for differences in technique without evidence of pathological fracture.      XR Lumbar Spine AP, Lateral, [place]   Final Result   Diffuse sclerotic osseous metastases involving the lumbar spine, similar to slightly progressed from comparison CT and without evidence of pathologic fracture or compression deformity.       XR Chest 2 views  Final Result      Clear lungs.         2:08 PM  Patient reports improvement of his pain has decreased now to a 7/10. Discussed with him that sx are likely d/t progressive metastasis seen on imaging. Discussed that we will give another dose of pain medication now, and patient feels comfortable to follow up with oncology as he has an appointment scheduled for tomorrow to discuss treatment plan and pain management moving forward.     3:14 PM  Patient is now feeling improved and comfortable for discharge after 2 doses of 4mg  morphine, did not require the 3rd dose ordered. He is comfortable to leave and understands to follow up with Oncology tomorrow as previously scheduled. Discussed study results with patient. Stressed the importance of and informed to follow up with primary oncologist as scheduled tomorrow. Given strict return precautions. Patient demonstrated understanding and agreed with treatment plan.     History     Chief Complaint  Weakness      HPI   Patient was seen by me at 11:13 AM.    Patient is a 59 y.o. male with a PMH of stage IV prostate cancer s/p prostatectomy (2006) and radiation therapy (currently on Denosumab and Lupron injections, and abiraterone) with bone mets, chronic pancreatitis, COPD, polysubstance abuse (EtOH, tobacco use, and opiates), hypertension, seizures, herpes zoster involving the sacral dermatome, and depression who presents to the ED for progressively worsening pains in his back, BLE, and BUE in the setting of bone cancer metastasis from stage IV prostate cancer. Patient reports that he has been getting increasingly sore constantly in his bones, particularly his back, but this has worsened recently and has progressed into his BLE. Movement exacerbates his pain. His pain is worse in his mid and lower back. Patient reports that in the setting of his recent increase in pain, he has been experiencing generalized weakness, worse to his BLE. He reports a fall this morning while preparing to come to the ED. He reports that he fell onto his lower back and reports worsening pain compared to baseline bone pain. Unclear if he hit his head or lost consciousness. He was with his brother who helped him get up, and helps him take his daily medications. Before 2 days ago, the patient reports that he was able to get up and walk around which is standard for him, but has been increasingly weak and has been sleeping/laying down 2/2 pain x2 days. He has been taking his pain medications as prescribed with some relief, sleeping off the pain. He took an oxycodone 10mg  this morning which provided less relief than normal.     Additionally, patient reports 2-3 days of increased cough with associated white sputum production. He states that he has a cough at baseline with COPD, but the sputum production has increased in the past few days. He has been using his inhaler with temporary relief. He also endorses chills and hot flashes. He has not measured a temp at home. He endorses nausea and intermittent vomiting x2 days, NBNB. He has been eating normally and denies changes in appetite. He denies diarrhea, constipation, bloody stools, urinary symptoms, abdominal pain. Patient denies recent sick contacts or COVID exposures, states that he rarely gets outside, and his family members who care for him recently have been vaccinated. He endorses tobacoo use 1/2 PPD. He stopped drinking EtOH 8 months ago. Current marijuana use.     Per chart review, recent imaging 10/06/2019 reveals Diffuse  sclerotic metastases throughout the visualized skeleton. Oncology note 12/29/2019 they report patient's clinical appearance deteriorating with increasing pain, ALP, and likely PSA. Recently switched from Xtandi to submicronized abiraterone. He has a follow up appointment scheduled for 02/08/2020 (tomorrow). They had discussed alternative treatment options with the patient to discuss at this appointment including radium 223, mitoxantrone, or cabazitaxel. Fentanyl was increased to 50 mcg every 3 days, and oxycodone to 10 mg tablet, every 4 hours as needed      Previous chart, nursing notes, and vital signs reviewed.      Pertinent labs & imaging results that were available during my care of the patient were reviewed by me and considered in my medical decision making (see chart for details).    Past Medical History:   Diagnosis Date   ??? Alcohol abuse    ??? COPD (chronic obstructive pulmonary disease) (CMS-HCC)    ??? Hx of radiation therapy    ??? Hypertension    ??? Polysubstance abuse (CMS-HCC)    ??? Prostate cancer (CMS-HCC)    ??? Seizures (CMS-HCC)        Past Surgical History:   Procedure Laterality Date   ??? IR INSERT PORT AGE GREATER THAN 5 YRS  07/15/2018    IR INSERT PORT AGE GREATER THAN 5 YRS 07/15/2018 Carolin Coy, MD IMG [facility]   ??? PR COLSC FLX W/RMVL OF TUMOR POLYP LESION SNARE TQ N/A 08/16/2015    Procedure: COLONOSCOPY FLEX; W/REMOV TUMOR/LES BY SNARE;  Surgeon: Gwen Pounds, MD;  Location: [facility] PROC[facility];  Service: Gastroenterology   ??? PR COLSC FLX WITH DIRECTED SUBMUCOSAL NJX ANY SBST N/A 08/16/2015    Procedure: COLONOSCOPY, FLEXIBLE, PROXIMAL TO SPLENIC FLEXURE; WITH DIRECTED SUBMUCOSAL INJECTION(S), ANY SUBSTANCE;  Surgeon: Gwen Pounds, MD;  Location: [facility];  Service: Gastroenterology   ??? PROSTATE SURGERY         No current facility-administered medications for this encounter.     Current Outpatient Medications:   ???  abiraterone, submicronized 125 mg Tab, Take 4 tablets (500 mg) by mouth daily. (Patient not taking: Reported on 01/14/2020), Disp: 120 tablet, Rfl: 11  ???  albuterol HFA 90 mcg/actuation inhaler, INHALE 2 PUFFS BY MOUTH EVERY 6 HOURS AS NEEDED FOR WHEEZING, Disp: 25.5 g, Rfl: 0  ???  amLODIPine (NORVASC) 10 MG tablet, TAKE 1 TABLET(10 MG) BY MOUTH DAILY, Disp: 30 tablet, Rfl: 11  ???  busPIRone (BUSPAR) 5 MG tablet, TAKE 1 TABLET BY MOUTH THREE TIMES A DAY, Disp: 270 tablet, Rfl: 3  ???  calcium carbonate 650 mg calcium (1,625 mg) tablet, Take 1 tablet (650 mg of elem calcium total) by mouth Two (2) times a day., Disp: 60 tablet, Rfl: 11  ???  cholecalciferol, vitamin D3, 250 mcg (10,000 unit) capsule, Take 50,000 Units by mouth once a week., Disp: 12 capsule, Rfl: 3  ???  diclofenac sodium (VOLTAREN) 1 % gel, Apply 2 g topically Four (4) times a day. Any area of pain, Disp: 200 g, Rfl: 5  ???  diphenhydrAMINE (BENADRYL) 25 mg tablet, Take 0.5 tablets (12.5 mg total) by mouth every six (6) hours as needed for itching., Disp: 30 capsule, Rfl: 0  ???  diphenhydramine HCl (MAGIC MOUTHWASH ORAL) suspension, Take 10 mL by mouth 4 (four) times a day as needed., Disp: 120 mL, Rfl: 1  ???  famotidine (PEPCID) 20 MG tablet, Take 1 tablet (20 mg total) by mouth two (2) times a day as needed for  heartburn., Disp: 60 tablet, Rfl: 0  ???  fentaNYL (DURAGESIC) 100 mcg/hr patch, Place 1 patch on the skin every third day., Disp: 10 patch, Rfl: 0  ???  gabapentin (NEURONTIN) 100 MG capsule, Take 1 capsule (100 mg total) by mouth at bedtime., Disp: 90 capsule, Rfl: 0  ???  megestroL (MEGACE) 400 mg/10 mL (40 mg/mL) suspension, Take 10 mL (400 mg total) by mouth Two (2) times a day. (Patient not taking: Reported on 01/14/2020), Disp: 480 mL, Rfl: 12  ???  melatonin 3 mg Tab, TK 1 T PO HS PRN, Disp: , Rfl:   ???  methylPREDNISolone (MEDROL) 4 MG tablet, Take 1 tablet (4 mg total) by mouth Two (2) times a day. Take when on abiraterone. (Patient not taking: Reported on 02/08/2020), Disp: 60 tablet, Rfl: 11  ???  MULTIVITAMIN 50 PLUS Tab, TK 1 T PO D, Disp: , Rfl:   ???  nicotine (NICODERM CQ) 14 mg/24 hr patch, Place 1 patch on the skin daily. (Patient not taking: Reported on 01/14/2020), Disp: 28 patch, Rfl: 0  ???  omeprazole (PRILOSEC) 20 MG capsule, TAKE 2 CAPSULES(40 MG) BY MOUTH TWICE DAILY, Disp: 368 capsule, Rfl: 0  ???  oxyCODONE (ROXICODONE) 10 mg immediate release tablet, Take 1 tablet (10 mg total) by mouth every four (4) hours as needed for pain. May increase to 2 tablets if need be, Disp: 200 tablet, Rfl: 0  ???  pancrelipase, Lip-Prot-Amyl, (CREON) 24,000-76,000 -120,000 unit CpDR delayed release capsule, Take 2 capsules (48,000 units of lipase total) by mouth Three (3) times a day with a meal. Take an additional 1 capsule with snacks., Disp: 540 each, Rfl: 3  ???  polyethylene glycol (MIRALAX) 17 gram/dose powder, Take 17 g by mouth daily., Disp: 595 g, Rfl: 11  ???  predniSONE (DELTASONE) 5 MG tablet, Take 1 tablet (5 mg total) by mouth Two (2) times a day., Disp: 60 tablet, Rfl: 5  ???  prochlorperazine (COMPAZINE) 10 MG tablet, TAKE 1 TABLET(10 MG) BY MOUTH EVERY 8 HOURS AS NEEDED (Patient not taking: Reported on 02/08/2020), Disp: 30 tablet, Rfl: 1  ???  salmeteroL (SEREVENT) 50 mcg/dose diskus inhaler, Inhale 1 puff Two (2) times a day., Disp: 1 Inhaler, Rfl: 12  ???  senna (SENOKOT) 8.6 mg tablet, Take 2 tablets by mouth Two (2) times a day. As neeeded to have at least 1 BM daily (Patient not taking: Reported on 02/08/2020), Disp: 60 tablet, Rfl: 2  ???  tiotropium (SPIRIVA WITH HANDIHALER) 18 mcg inhalation capsule, Place 1 capsule (18 mcg total) into inhaler and inhale once daily., Disp: 90 capsule, Rfl: 3  ???  traZODone (DESYREL) 100 MG tablet, Take 1 tablet (100 mg total) by mouth nightly as needed for sleep., Disp: 90 tablet, Rfl: 3  ???  valACYclovir (VALTREX) 500 MG tablet, Take 1 tablet (500 mg total) by mouth daily., Disp: 90 tablet, Rfl: 3    Allergies  Penicillins and Tramadol    Family History   Problem Relation Age of Onset   ??? Heart disease Mother    ??? Diabetes Mother    ??? Diabetes Father    ??? Melanoma Neg Hx    ??? Basal cell carcinoma Neg Hx    ??? Squamous cell carcinoma Neg Hx        Social History  Social History     Tobacco Use   ??? Smoking status: Current Some Day Smoker     Packs/day: 0.50     Years: 40.00  Pack years: 20.00     Types: Cigarettes     Start date: 05/09/1994   ??? Smokeless tobacco: Never Used   Substance Use Topics   ??? Alcohol use: Yes     Comment: Pt last drank 6 months   ??? Drug use: Yes     Types: Marijuana       Review of Systems    Review of Systems   Constitutional: Negative for appetite change and fever.   Respiratory: Positive for cough. Negative for shortness of breath.    Cardiovascular: Negative for chest pain.   Gastrointestinal: Positive for nausea and vomiting. Negative for abdominal pain.   Musculoskeletal: Positive for arthralgias and back pain.   Neurological: Positive for weakness.   All other systems reviewed and are negative.        Labs     Labs Reviewed   COMPREHENSIVE METABOLIC PANEL - Abnormal; Notable for the following components:       Result Value    Chloride 112 (*)     CO2 17.0 (*)     BUN 24 (*)     Creatinine 0.65 (*)     Calcium 7.7 (*)     Alkaline Phosphatase 484 (*)     All other components within normal limits   URINALYSIS WITH CULTURE REFLEX - Abnormal; Notable for the following components:    RBC, UA 5 (*)     WBC, UA 4 (*)     WBC Clumps Rare (*)     Mucus, UA Rare (*)     All other components within normal limits   CBC W/ AUTO DIFF - Abnormal; Notable for the following components:    RBC 2.89 (*)     HGB 8.8 (*)     HCT 25.6 (*)     RDW 18.2 (*)     Anisocytosis Slight (*)     All other components within normal limits   CBC W/ DIFFERENTIAL    Narrative:     The following orders were created for panel order CBC w/ Differential.  Procedure                               Abnormality         Status                     ---------                               -----------         ------                     CBC w/ Differential[[id number]]         Abnormal            Final result                 Please view results for these tests on the individual orders.       Radiology     No results found.    Physical Exam     VITAL SIGNS:    Vitals:    02/07/20 1108 02/07/20 1200 02/07/20 1300 02/07/20 1400   BP:  124/73 102/57 118/74   Pulse: 77 78 82 88   Resp: 24 22 17 16    Temp: 36.4 ??C (97.5 ??F)  TempSrc: Oral      SpO2: 99% 99% 98% 98%       Physical Exam  Vitals signs and nursing note reviewed.   Constitutional:       General: He is not in acute distress.     Appearance: He is well-developed. He is not ill-appearing, toxic-appearing or diaphoretic.      Comments:  Pt chronically ill appearing on exam, in NAD, non-toxic.    HENT:      Head: Normocephalic and atraumatic.   Neck:      Musculoskeletal: Normal range of motion.   Cardiovascular:      Rate and Rhythm: Normal rate and regular rhythm.      Heart sounds: Normal heart sounds. No murmur. No friction rub. No gallop.    Pulmonary:      Effort: Pulmonary effort is normal. No respiratory distress.      Breath sounds: Normal breath sounds. No stridor. No wheezing, rhonchi or rales.   Abdominal:      General: There is no distension.      Palpations: Abdomen is soft. There is no mass.      Tenderness: There is no abdominal tenderness. There is no right CVA tenderness, left CVA tenderness, guarding or rebound.      Hernia: No hernia is present.   Musculoskeletal: Normal range of motion.         General: Tenderness present. No swelling, deformity or signs of injury.      Comments:  Pt with TTP to the mid-thoracic to upper-lumbar midline spinal region. No TTP to BL upper or lower extremities. He does have ROM of the upper and lower extremities. Good grip strength in BL upper extremities, good plantar and dorsiflexion BLLE strength, does have 3/5 hip flexor strength in BLLE, seems mostly secondary to pain.      Skin:     General: Skin is warm and dry.   Neurological:      General: No focal deficit present.      Mental Status: He is alert and oriented to person, place, and time.      Cranial Nerves: No cranial nerve deficit.      Sensory: No sensory deficit.      Coordination: Coordination normal.   Psychiatric:         Behavior: Behavior normal.           Documentation assistance was provided by Levin Erp, Scribe, on February 07, 2020 at 11:13 AM for Bluford Kaufmann, [place].    Documentation assistance was provided by the scribe in my presence.  The documentation recorded by the scribe has been reviewed by me and accurately reflects the services I personally performed.       Toni Arthurs [place], [place]  02/10/20 [phone]

## 2020-02-07 NOTE — Unmapped (Signed)
Pt with weakness since yesterday. Pt reports stage 4 bone cancer. Pt unable to walk due to weakness

## 2020-02-08 ENCOUNTER — Ambulatory Visit
Admit: 2020-02-08 | Discharge: 2020-02-08 | Payer: MEDICAID | Attending: Hematology & Oncology | Primary: Hematology & Oncology

## 2020-02-08 ENCOUNTER — Institutional Professional Consult (permissible substitution): Admit: 2020-02-08 | Discharge: 2020-02-08 | Payer: MEDICAID

## 2020-02-08 DIAGNOSIS — K860 Alcohol-induced chronic pancreatitis: Principal | ICD-10-CM

## 2020-02-08 DIAGNOSIS — C61 Malignant neoplasm of prostate: Principal | ICD-10-CM

## 2020-02-08 DIAGNOSIS — D649 Anemia, unspecified: Principal | ICD-10-CM

## 2020-02-08 LAB — CBC W/ AUTO DIFF
BASOPHILS RELATIVE PERCENT: 0.7 %
EOSINOPHILS ABSOLUTE COUNT: 0 10*9/L (ref 0.0–0.7)
EOSINOPHILS RELATIVE PERCENT: 0.5 %
HEMATOCRIT: 24.1 % — ABNORMAL LOW (ref 38.0–50.0)
HEMOGLOBIN: 8.2 g/dL — ABNORMAL LOW (ref 13.5–17.5)
LYMPHOCYTES ABSOLUTE COUNT: 1.3 10*9/L (ref 0.7–4.0)
LYMPHOCYTES RELATIVE PERCENT: 18.3 %
MEAN CORPUSCULAR HEMOGLOBIN CONC: 33.9 g/dL (ref 30.0–36.0)
MEAN CORPUSCULAR HEMOGLOBIN: 30.2 pg (ref 26.0–34.0)
MEAN CORPUSCULAR VOLUME: 89.1 fL (ref 81.0–95.0)
MEAN PLATELET VOLUME: 6.8 fL — ABNORMAL LOW (ref 7.0–10.0)
MONOCYTES ABSOLUTE COUNT: 0.5 10*9/L (ref 0.1–1.0)
MONOCYTES RELATIVE PERCENT: 6.7 %
NEUTROPHILS RELATIVE PERCENT: 73.8 %
PLATELET COUNT: 221 10*9/L (ref 150–450)
RED BLOOD CELL COUNT: 2.7 10*12/L — ABNORMAL LOW (ref 4.32–5.72)
RED CELL DISTRIBUTION WIDTH: 18.5 % — ABNORMAL HIGH (ref 12.0–15.0)

## 2020-02-08 LAB — CREATININE
Creatinine:MCnc:Pt:Ser/Plas:Qn:: 0.63 — ABNORMAL LOW
EGFR CKD-EPI NON-AA MALE: >90 mL/min/{1.73_m2} (ref >=60)

## 2020-02-08 LAB — SLIDE REVIEW

## 2020-02-08 LAB — RETICULOCYTES: RETIC HGB CONTENT: 33 pg (ref 29.7–36.1)

## 2020-02-08 LAB — HEPATIC FUNCTION PANEL
ALKALINE PHOSPHATASE: 460 U/L — ABNORMAL HIGH (ref 38–126)
ALT (SGPT): 10 U/L (ref <50)
BILIRUBIN DIRECT: <0.1 mg/dL (ref 0.00–0.40)
BILIRUBIN TOTAL: 0.4 mg/dL (ref 0.0–1.2)
PROTEIN TOTAL: 6.6 g/dL (ref 6.5–8.3)

## 2020-02-08 LAB — RED BLOOD CELL COUNT: Lab: 2.7 — ABNORMAL LOW

## 2020-02-08 LAB — PHOSPHORUS: Phosphate:MCnc:Pt:Ser/Plas:Qn:: 2 — ABNORMAL LOW

## 2020-02-08 LAB — FERRITIN: Ferritin:MCnc:Pt:Ser/Plas:Qn:: 566 — ABNORMAL HIGH

## 2020-02-08 LAB — TEAR DROP CELLS

## 2020-02-08 LAB — ALBUMIN
ALBUMIN: 3.8 g/dL (ref 3.5–5.0)
Albumin:MCnc:Pt:Ser/Plas:Qn:: 3.8

## 2020-02-08 LAB — RETICULOCYTE COUNT PCT: Lab: 3.3 — ABNORMAL HIGH

## 2020-02-08 LAB — CALCIUM: Calcium:MCnc:Pt:Ser/Plas:Qn:: 7.8 — ABNORMAL LOW

## 2020-02-08 LAB — PROTEIN TOTAL: Protein:MCnc:Pt:Ser/Plas:Qn:: 6.6

## 2020-02-08 LAB — PROSTATE SPECIFIC ANTIGEN: Prostate specific Ag:MCnc:Pt:Ser/Plas:Qn:: 730 — ABNORMAL HIGH

## 2020-02-08 LAB — IRON & TIBC: TRANSFERRIN: 182.2 mg/dL — ABNORMAL LOW (ref 200.0–380.0)

## 2020-02-08 LAB — TRANSFERRIN: Transferrin:MCnc:Pt:Ser/Plas:Qn:: 182.2 — ABNORMAL LOW

## 2020-02-08 MED ORDER — FENTANYL 100 MCG/HR TRANSDERMAL PATCH
MEDICATED_PATCH | TRANSDERMAL | 0 refills | 30 days | Status: CP
Start: 2020-02-08 — End: 2021-02-07

## 2020-02-08 MED ORDER — CALCIUM CARBONATE 650 MG CALCIUM (1,625 MG) TABLET
ORAL_TABLET | Freq: Two times a day (BID) | ORAL | 11 refills | 30 days | Status: CP
Start: 2020-02-08 — End: 2021-02-07

## 2020-02-08 MED ORDER — CHOLECALCIFEROL (VITAMIN D3) 250 MCG (10,000 UNIT) CAPSULE
ORAL_CAPSULE | ORAL | 3 refills | 0 days | Status: CP
Start: 2020-02-08 — End: 2021-02-07

## 2020-02-08 MED ORDER — OXYCODONE 10 MG TABLET
ORAL_TABLET | ORAL | 0 refills | 34 days | Status: CP | PRN
Start: 2020-02-08 — End: ?

## 2020-02-08 NOTE — Unmapped (Signed)
--  Add calcium carbonate 650 mg twice daily and vitamin D (ergocalciferol) 50,000 units weekly to your current medication regimen.  --Fentanyl will be increased from 50 mcg/h patch to 100 mcg/h patch, every 3 days.  --Oxycodone 10 mg can be taken every 4 hours as needed, and if you are having exceptionally severe pain, you can double the dose by taking 2 pills.  --We will plan to have you back in 1 month for follow-up

## 2020-02-08 NOTE — Unmapped (Signed)
[name] here for followup with Dr. Claude Manges.    Weight: Pt has lost approx 9lbs since January  Energy Level/Fatigue/Active Level: Not feeling well today  Elimination/Discharge: No issues reported today - pt on bowel regimen for constipation  Nausea/Vomitting: Nauseated last couple of days and vomiting - started eating something before taking medication and this has helped  Appetite/Diet: Poor  Pain/Swelling: Pt reports 8/10 back pain - pt fell yesterday on way to ED - see imaging from yesterday  Skin: Intact    Chemo: NA  Psychosocial: No needs  Refills of Meds: None    Notified Dr. Claude Manges.

## 2020-02-08 NOTE — Unmapped (Signed)
Pt's port accessed - flushed, bld return noted, labs drawn and heplocked. Port de-accessed. Pt tolerated well.

## 2020-02-08 NOTE — Unmapped (Signed)
Labs completed via port.    Ca 7.8 and Phosphorus 2.0. Notified Dr. Claude Manges - hold Xgeva injection today.    Pt received Lupron injection in right gluteal - pt tolerated it well.    AVS received at scheduling.    Pt stable and assisted from clinic via wheelchair by staff.

## 2020-02-09 NOTE — Unmapped (Signed)
Addended by: Richardo Hanks on: 02/08/2020 05:58 PM     Modules accepted: Orders

## 2020-02-09 NOTE — Unmapped (Signed)
Called to discuss results. Reached his brother Joseph Phillips and discussed rising PSA, the need to change therapy.  Also discussed my conversation with Dr. Griffin Basil, who will be assuming management of the case.  We will arrange for restaging imaging, the patient will stop abiraterone, continue on prednisone 5 mg twice daily, and will have him return to begin cabazitaxel following x-rays, and prior approval of the drug.  We will try to make an appointment with Dr.Whang prior to starting the new drug.

## 2020-02-10 ENCOUNTER — Institutional Professional Consult (permissible substitution): Admit: 2020-02-10 | Discharge: 2020-02-11 | Payer: MEDICAID | Attending: Registered" | Primary: Registered"

## 2020-02-10 NOTE — Unmapped (Signed)
Enhanced Care Nutrition-Follow Up Phone Assessment    Nutrition Assessment:  Patient reports he is doing well overall. He reports the appetite stimulant continues to help with intake. He reports eating a lot of snacks.    Nutrition Diagnosis:  Undesirable food choices related to poor appetite as evidenced by skipping meals, eating highly processed foods    Patient Stated Health/Nutrition Goals:  Meet nutritional needs   Reduce long-term health risk    Identified treatment goals: Not applicable    Nutrition Interventions (ADVISE/ASSIST):  1. If not eating a meal have another Ensure or milk with added Carnation Instant Breakfast  ______________________________________________________________________  Joseph Phillips is a 59 y.o. male seen for medical nutrition therapy.    Referring MD or Clinic:   [facility], Referred  Max Jeri Cos, MD    Reason for Referral:   1. Unintentional weight loss        Medical History:  Past Medical History:   Diagnosis Date   ??? Alcohol abuse    ??? COPD (chronic obstructive pulmonary disease) (CMS-HCC)    ??? Hx of radiation therapy    ??? Hypertension    ??? Polysubstance abuse (CMS-HCC)    ??? Prostate cancer (CMS-HCC)    ??? Seizures (CMS-HCC)        Anthropometrics:    Present Weight   Current BMI     Goal Weight   Current Current IBW     Present Height         Weight History:  Wt Readings from Last 6 Encounters:   02/08/20 64.3 kg (141 lb 10.3 oz)   01/14/20 68 kg (150 lb)   12/29/19 67.1 kg (148 lb)   12/01/19 66.9 kg (147 lb 8 oz)   11/03/19 70.4 kg (155 lb 3.2 oz)   10/27/19 70.8 kg (156 lb 1.6 oz)       Medications, Herbs, Supplements:  Reviewed nutritionally relevant medications and supplements.    Recent Labs:   Hemoglobin A1C (%)   Date Value   03/23/2014 4.8   04/17/2012 5.1      No components found for: LDLCALC   BP Readings from Last 3 Encounters:   02/08/20 130/64   02/07/20 118/74   01/14/20 125/72     Lab Results   Component Value Date    CHOL 98 (L) 09/26/2018    CHOL 125 03/23/2014 CHOL 129 09/11/2011     Lab Results   Component Value Date    HDL 36 (L) 09/26/2018    HDL 44 03/23/2014    HDL 45 09/11/2011     Lab Results   Component Value Date    LDL 33 (L) 09/26/2018    LDL <30 03/23/2014    LDL 62 09/11/2011     Lab Results   Component Value Date    VLDL 29.4 09/26/2018     Lab Results   Component Value Date    CHOLHDLRATIO 2.7 09/26/2018     Lab Results   Component Value Date    TRIG 147 09/26/2018    TRIG 142 09/11/2011       Physical Activity:  Tries to walk a little    Dietary Restrictions, Food Intolerances:  Bread-feels like it gets stuck    Other GI Issues:  Nausea in the morning    Allergies:   Allergies   Allergen Reactions   ??? Penicillins      Other reaction(s): UNKNOWN   ??? 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    Behavioral Risk Factors:  Sleep- N/A  Fast Eating  N/A  Overeating  N/A    Hunger and Satiety Issues:  Appetite: Getting better    Patient Questions:  None    Estimated Needs:  Estimated Energy Needs: 1800-2100 kcal/day    Estimated Protein Needs: 65-75 gm/day    Materials Provided To Meet their identified goals:  List of recommendations    Food labels discussed: Yes    Expected Compliance/Barriers are:   Comprehension of plan fair  Readiness for change fair  Ability to meet goals fair    We assessed family/social/cultural characteristics and these were relevant to care: none evident    Patient has auditory or visual communication barrier or need: no    Interventions Codes:  General/healthful diet Follow-up (ARRANGE):  2 months      Length of visit was 22 minuntes. Patient understands diagnosis and treatment plan. Patient voices understanding. Plan is consistent with the patient???s preferences and goals.    This visit is conducted via telephone conferencing.    Contact Information  Person Contacted: Patient  Contact Phone number: [phone] (home)   Is there someone else in the room? No.     The patient was physically located in [place] or a state in which I am permitted to provide care. The patient and/or parent/gauardian understood that s/he may incur co-pays and cost sharing, and agreed to the telemedicine visit. The visit was completed via phone and/or video, which was appropriate and reasonable under the circumstances given the patient's presentation at the time.    The patient and/or parent/guardian has been advised of the potential risks and limitations of this mode of treatment (including, but not limited to, the absence of in-person examination) and has agreed to be treated using telemedicine. The patient's/patient's family's questions regarding telemedicine have been answered.     If the phone/video visit was completed in an ambulatory setting, the patient and/or parent/guardian has also been advised to contact their provider???s office for worsening conditions, and seek emergency medical treatment and/or call 911 if the patient deems either necessary.

## 2020-02-10 NOTE — Unmapped (Signed)
I attempted to contact patient to notify him of appointments for labs and injection.  Mobile number does not have a vmb set up, therefore, I was unable to leave a message.

## 2020-02-10 NOTE — Unmapped (Signed)
Patient Stated Health/Nutrition Goals:  Meet nutritional needs   Reduce long-term health risk    Identified treatment goals: Not applicable    Nutrition Interventions (ADVISE/ASSIST):  1. If not eating a meal have another Ensure or milk with added [employer]

## 2020-02-15 NOTE — Unmapped (Signed)
Patient Joseph Phillips was called in attempt number one to reach them regarding their upcoming appointments on 02/24/20.     The call resulted in: Left Message    Please inform the patient upon their return call of the following:     02/24/20 appts  1. 3:15 Labs  2. 4:30 with Dr. Philomena Course    Thank you,    [name]

## 2020-02-15 NOTE — Unmapped (Signed)
I spoke with patient's brother to notify him of CT scheduled for 02/18/20 @ 2:30 in [place].  Patient's brother confirmed appointment.

## 2020-02-17 DIAGNOSIS — C61 Malignant neoplasm of prostate: Principal | ICD-10-CM

## 2020-02-18 ENCOUNTER — Ambulatory Visit: Admit: 2020-02-18 | Discharge: 2020-02-19 | Payer: MEDICAID

## 2020-02-24 NOTE — Unmapped (Signed)
I spoke with patient Joseph Phillips to confirm appointments on the following date(s): 03/07/20 for labs at [facility], Dr. Philomena Course and injection.    [name]

## 2020-02-24 NOTE — Unmapped (Signed)
[facility] Refill Coordination Note    Specialty Medication(s) to be Shipped:       Other medication(s) to be shipped: methylprednisolone     Joseph Phillips, DOB: [birth year]  Phone: [phone] (home)       All above HIPAA information was verified with patient.     Was a Nurse, learning disability used for this call? No    Completed refill call assessment today to schedule patient's medication shipment from the [facility] [phone]).       Specialty medication(s) and dose(s) confirmed: Patient reports changes to the regimen as follows: yonsa discontinued   Changes to medications: [name] reports no changes at this time.  Changes to insurance: No  Questions for the pharmacist: No    Confirmed patient received Welcome Packet with first shipment. The patient will receive a drug information handout for each medication shipped and additional FDA Medication Guides as required.       DISEASE/MEDICATION-SPECIFIC INFORMATION        N/A    SPECIALTY MEDICATION ADHERENCE     Medication Adherence    Informant: patient  Support network for adherence: family member  Confirmed plan for next specialty medication refill: delivery by pharmacy  Refills needed for supportive medications: not needed          Refill Coordination    Has the Patients' Contact Information Changed: No  Is the Shipping Address Different: No                 SHIPPING     Shipping address confirmed in Epic.     Delivery Scheduled: Yes, Expected medication delivery date: 3/9.     Medication will be delivered via UPS to the prescription address in Epic WAM.    [name]   [facility] Specialty Technician

## 2020-02-24 NOTE — Unmapped (Signed)
This patient has been disenrolled from the [facility] specialty pharmacy services due to medication discontinuation resulting from progression of disease. Per epic notes 02/08/20 from Dr. Claude Manges.  Patient care being transferred to Dr. Philomena Course.    Kermit Balo  [facility] Specialty Pharmacist

## 2020-02-24 NOTE — Unmapped (Deleted)
[facility] Visit Note    Patient Name: Joseph Phillips  Patient Age: 59 y.o.  Encounter Date: 02/24/2020  Attending Provider:  Zainah Steven E. Philomena Course, MD  Referring physician: Unknown Foley, MD    Assessment  Patient Active Problem List   Diagnosis   ??? 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 Acute pancreatitis   ??? Depressive disorder   ??? Reflux esophagitis   ??? Encounter for long-term (current) use of other medications   ??? Pain medication agreement broken   ??? Do not give narcotics   ??? PPD screening test   ??? Tobacco use disorder   ??? Prostate cancer (CMS-HCC)   ??? Hx of radiation therapy   ??? Healthcare maintenance   ??? Sleep difficulties   ??? Essential hypertension   ??? Anxiety   ??? Hypokalemia   ??? Hypomagnesemia   ??? Herpes zoster involving sacral dermatome   ??? Stomatitis and mucositis   ??? Odynophagia   ??? Mucositis due to chemotherapy   ??? Chest pain, mid sternal   ??? Acute midline thoracic back pain   ??? Hyperkalemia   ??? COPD (chronic obstructive pulmonary disease) (CMS-HCC)   ??? Chronic pancreatitis (CMS-HCC)   ??? 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 Cough   ??? Fever and chills   ??? Acute alcoholic gastritis without hemorrhage   ??? Alcohol withdrawal syndrome without complication (CMS-HCC)           Recommendations    1) Germline testing    Cabazi vs Radium    Lupron, denosumab        Reason for Visit  The patient is seen in consultation at the request of Heide Scales, Max F, MD for evaluation of     History of Present Illness:  Oncology History   Prostate cancer (CMS-HCC)   12/02/2008 Biopsy    Prostate, left apex, needle biopsy  - Adenocarcinoma, Gleason combined score 6 (3+3), 2 of 2 cores involved, tumor  approximately 2 mm in greatest diameter, approximately 10% of tissue involved by  carcinoma.  - See comment    E: ??Prostate, left mid, needle biopsy  - Adenocarcinoma, Gleason combined score 6 (3+3), 1 of 2 cores involved, tumor  approximately 2 mm in greatest diameter, approximately 5% of tissue involved by  carcinoma.  - See comment    F: ??Prostate, left base, needle biopsy  - Adenocarcinoma, Gleason combined score 6 (3+3), 1 of 2 cores involved, tumor  approximately 1 mm in greatest diameter, approximately 5% of tissue involved by  carcinoma.     01/24/2009 Surgery    Prostate, robot assisted laparoscopic prostatectomy  Tumor histologic type: Prostatic adenocarcinoma  Tumor grade (Gleason system): Gleason pattern 7 (3+4)  Approximate percentage of prostate involved by tumor: 40%  Extent of tumor:  Location of tumor involvement in prostate: right and left lobes, anterior  and posterior, apex to base  Confinement/non-confinement of tumor within the prostatic capsule: ??not  confined  Location of extracapsular tumor involvement: ??left prostate toward the base  (ie. block A22)  Type of extracapsular tissue involved within tumor: ?? adipose  tissue/perineural tissue  Angiolymphatic space invasion: not identified  Seminal vesicles: negative  Vas deferens: negative  Histologic assessment of surgical margins: positive margin along a 3 mm  face in the left prostate base (block A25)    Other significant findings: High grade prostatic intraepithelial  Neoplasm;  Lymph nodes: Not applicable  AJCC Staging: pT3a pNx  08/30/2013 Initial Diagnosis    Prostate cancer (CMS-HCC)     04/30/2017 -  Chemotherapy    Lupron 22.5 mg; with bicalutamide x  month     08/01/2017 - 06/30/2018 Chemotherapy    Abiaterone 1000 mg daily; decrease to 750 mg daily at 10/2018;     10/30/2017 -  Cancer Staged    Starta: TP53, TMPSSR  -EGR fusion; no other abnormalities identified; MSI-stable; no defects in homologous recombination genes.     01/14/2018 -  Cancer Staged    CT Chest:  Numerous sclerotic metastases of the thoracic spine, left scapula  and multiple ribs.  3. Coronary artery and aortic atherosclerosis (ICD10-I70.0).  4. Previously demonstrated left renal mass is not included in the  field of view.     06/04/2018 -  Cancer Staged CT CAP:  Scattered diffuse sclerotic metastatic lesions are seen throughout the spine as well as the left scapula; some of the lesions are new since prior CT dated 04/20/2015.  - No intrathoracic metastatic disease.  Interval progression of osseous metastases throughout the spine and pelvis compared to 04/22/2017.  -- Cholelithiasis.  -- Hepatic steatosis.     06/04/2018 -  Cancer Staged    Bone scan:  Increased radiotracer uptake throughout the spine, sacrum, and left ilium, consistent with numerous metastases better evaluated on same-day CT. Unchanged metastasis to the left proximal humerus.     07/14/2018 - 11/12/2018 Chemotherapy    OP PROSTATE DOCETAXEL/PREDNISONE  Docetaxel 75 mg/m2 evry 21 days     01/07/2019 -  Cancer Staged    CTA Chest:  No evidence of pulmonary embolism.  2. Occlusion of the medial basilar segmental bronchus of the left  lower lobe, nonspecific though could be seen in the setting of  aspiration. Otherwise, no acute findings.  3. Apical predominant paraseptal emphysematous change with suspected  mild progression of smoking related fibrosis with subpleural  reticulation but without evidence of honeycombing. Emphysema  (ICD10-J43.9).  4. Progression of osseous metastatic disease since 11/2017. Detailed  above. No definitive pathologic compression fractures.  5. Hepatic steatosis. 6. Coronary artery calcifications. Aortic Atherosclerosis     01/20/2019 -  Cancer Staged    NM Bone scan: Redemonstrated multiple abnormal foci throughout the appendicular axial skeleton, with interval increase in the extent of the T10 vertebral body lesion and new lesions involving the manubrium, left fifth and seventh ribs. This is most consistent with progression of osseous metastatic disease.     02/03/2019 - 12/14/2019 Chemotherapy    Xtandi 160 mg qD; held after 1 month due to swelling.  Restart at 80 mg qD on 03/29/19, then back to 160 mg daily in 05/2019     03/10/2019 -  Cancer Staged    CTA chest:  No evidence of acute aortic intramural hematoma or dissection.  Multifocal osseous sclerotic metastatic disease, progressed when compared to prior CT 06/04/2018 and grossly similar to most recent nuclear medicine bone scan.  Mucoid impacted airways in left lower lobe with adjacent subtle ground glass opacities; likely represents sequelae of aspiration. Recommend follow up CT in 6 to 8 weeks to ensure clearing.  Other chronic/incidental findings as above, not substantially changed.     09/02/2019 -  Cancer Staged    CTA Chest:  Interval increased sclerosis of diffuse osseous metastatic disease compared to 03/10/2019, likely reflects treatment related changes         09/07/2019 Endocrine/Hormone Therapy    OP LEUPROLIDE (7.5 MG EVERY MONTH)  Plan Provider:  Towanda Malkin, MD     10/06/2019 -  Cancer Staged    CT AP:  Diffuse sclerotic metastases throughout the visualized skeleton, similar to prior exam.  No adenopathy. Prostate surgically absent     12/14/2019 -  Chemotherapy    Abiraterone, submicronized 500 mg daily, prednisone 5 mg twice daily     02/15/2020 -  Chemotherapy    OP PROSTATE CABAZITAXEL  cabazitaxel 20 mg/m2 IV on day 1, every 21 days     02/21/2020 -  Cancer Staged    CT CAP:  No new suspicious pulmonary nodule or mass.  Interval increased sclerosis of diffusely scattered osseous lesions; may reflect treatment-related changes.  Marked interval decrease in previously noted lower lobe predominant groundglass opacities with trace residual opacities in left lower lobe  Diffuse osseous sclerotic metastatic disease in AP, similar to prior.  Otherwise no evidence of metastatic disease in the abdomen/pelvis.       Oncology history  -- On 01/24/2009, s/p RALP, path: pT3a, Gleason 3+4=7, positive margin.  -- From 05/29/2009 - 07/11/2009, adjuvant radiation therapy  -- In 03/2011, PSA 0.3  --In 04/2017, PSA 48, bone mets. Started on ADT with Lupron  --In 07/2017, abiraterone added for [facility] treatment intensification. In 12/2017, PSA nadir of 0.8  --In 04/2018, PSA 2.55, second consecutive increase, progression to CRPC  --In 06/2018 to11/2019, docetaxel chemo X 5 cycles, stopped due to toxicity  --In 01/2019 - 11/2019, on enzalutamide  --In 11/2019-01/2020, on submicronized abiraterone          Allergies:  Allergies   Allergen Reactions   ??? Penicillins      Other reaction(s): UNKNOWN   ??? Tramadol Dizziness       Current Medications:    Current Outpatient Medications:   ???  abiraterone, submicronized 125 mg Tab, Take 4 tablets (500 mg) by mouth daily. (Patient not taking: Reported on 01/14/2020), Disp: 120 tablet, Rfl: 11  ???  albuterol HFA 90 mcg/actuation inhaler, INHALE 2 PUFFS BY MOUTH EVERY 6 HOURS AS NEEDED FOR WHEEZING, Disp: 25.5 g, Rfl: 0  ???  amLODIPine (NORVASC) 10 MG tablet, TAKE 1 TABLET(10 MG) BY MOUTH DAILY, Disp: 30 tablet, Rfl: 11  ???  busPIRone (BUSPAR) 5 MG tablet, TAKE 1 TABLET BY MOUTH THREE TIMES A DAY, Disp: 270 tablet, Rfl: 3  ???  calcium carbonate 650 mg calcium (1,625 mg) tablet, Take 1 tablet (650 mg of elem calcium total) by mouth Two (2) times a day., Disp: 60 tablet, Rfl: 11  ???  cholecalciferol, vitamin D3, 250 mcg (10,000 unit) capsule, Take 50,000 Units by mouth once a week., Disp: 12 capsule, Rfl: 3  ???  diclofenac sodium (VOLTAREN) 1 % gel, Apply 2 g topically Four (4) times a day. Any area of pain, Disp: 200 g, Rfl: 5  ???  diphenhydrAMINE (BENADRYL) 25 mg tablet, Take 0.5 tablets (12.5 mg total) by mouth every six (6) hours as needed for itching., Disp: 30 capsule, Rfl: 0  ???  diphenhydramine HCl (MAGIC MOUTHWASH ORAL) suspension, Take 10 mL by mouth 4 (four) times a day as needed., Disp: 120 mL, Rfl: 1  ???  famotidine (PEPCID) 20 MG tablet, Take 1 tablet (20 mg total) by mouth two (2) times a day as needed for heartburn., Disp: 60 tablet, Rfl: 0  ???  fentaNYL (DURAGESIC) 100 mcg/hr patch, Place 1 patch on the skin every third day., Disp: 10 patch, Rfl: 0  ???  gabapentin (NEURONTIN) 100 MG capsule, Take 1 capsule (  100 mg total) by mouth at bedtime., Disp: 90 capsule, Rfl: 0  ???  megestroL (MEGACE) 400 mg/10 mL (40 mg/mL) suspension, Take 10 mL (400 mg total) by mouth Two (2) times a day. (Patient not taking: Reported on 01/14/2020), Disp: 480 mL, Rfl: 12  ???  melatonin 3 mg Tab, TK 1 T PO HS PRN, Disp: , Rfl:   ???  methylPREDNISolone (MEDROL) 4 MG tablet, Take 1 tablet (4 mg total) by mouth Two (2) times a day. Take when on abiraterone. (Patient not taking: Reported on 02/08/2020), Disp: 60 tablet, Rfl: 11  ???  MULTIVITAMIN 50 PLUS Tab, TK 1 T PO D, Disp: , Rfl:   ???  nicotine (NICODERM CQ) 14 mg/24 hr patch, Place 1 patch on the skin daily. (Patient not taking: Reported on 01/14/2020), Disp: 28 patch, Rfl: 0  ???  omeprazole (PRILOSEC) 20 MG capsule, TAKE 2 CAPSULES(40 MG) BY MOUTH TWICE DAILY, Disp: 368 capsule, Rfl: 0  ???  oxyCODONE (ROXICODONE) 10 mg immediate release tablet, Take 1 tablet (10 mg total) by mouth every four (4) hours as needed for pain. May increase to 2 tablets if need be, Disp: 200 tablet, Rfl: 0  ???  pancrelipase, Lip-Prot-Amyl, (CREON) 24,000-76,000 -120,000 unit CpDR delayed release capsule, Take 2 capsules (48,000 units of lipase total) by mouth Three (3) times a day with a meal. Take an additional 1 capsule with snacks., Disp: 540 each, Rfl: 3  ???  polyethylene glycol (MIRALAX) 17 gram/dose powder, Take 17 g by mouth daily., Disp: 595 g, Rfl: 11  ???  predniSONE (DELTASONE) 5 MG tablet, Take 1 tablet (5 mg total) by mouth Two (2) times a day., Disp: 60 tablet, Rfl: 5  ???  prochlorperazine (COMPAZINE) 10 MG tablet, TAKE 1 TABLET(10 MG) BY MOUTH EVERY 8 HOURS AS NEEDED (Patient not taking: Reported on 02/08/2020), Disp: 30 tablet, Rfl: 1  ???  salmeteroL (SEREVENT) 50 mcg/dose diskus inhaler, Inhale 1 puff Two (2) times a day., Disp: 1 Inhaler, Rfl: 12  ???  senna (SENOKOT) 8.6 mg tablet, Take 2 tablets by mouth Two (2) times a day. As neeeded to have at least 1 BM daily (Patient not taking: Reported on 02/08/2020), Disp: 60 tablet, Rfl: 2  ???  tiotropium (SPIRIVA WITH HANDIHALER) 18 mcg inhalation capsule, Place 1 capsule (18 mcg total) into inhaler and inhale once daily., Disp: 90 capsule, Rfl: 3  ???  traZODone (DESYREL) 100 MG tablet, Take 1 tablet (100 mg total) by mouth nightly as needed for sleep., Disp: 90 tablet, Rfl: 3  ???  valACYclovir (VALTREX) 500 MG tablet, Take 1 tablet (500 mg total) by mouth daily., Disp: 90 tablet, Rfl: 3    Past Medical History and Social History  Past Medical History:   Diagnosis Date   ??? Alcohol abuse    ??? COPD (chronic obstructive pulmonary disease) (CMS-HCC)    ??? Hx of radiation therapy    ??? Hypertension    ??? Polysubstance abuse (CMS-HCC)    ??? Prostate cancer (CMS-HCC)    ??? Seizures (CMS-HCC)       Past Surgical History:   Procedure Laterality Date   ??? IR INSERT PORT AGE GREATER THAN 5 YRS  07/15/2018    IR INSERT PORT AGE GREATER THAN 5 YRS 07/15/2018 Carolin Coy, MD IMG [facility]   ??? PR COLSC FLX W/RMVL OF TUMOR POLYP LESION SNARE TQ N/A 08/16/2015    Procedure: COLONOSCOPY FLEX; W/REMOV TUMOR/LES BY SNARE;  Surgeon: Gwen Pounds,  MD;  Location: [facility];  Service: Gastroenterology   ??? PR COLSC FLX WITH DIRECTED SUBMUCOSAL NJX ANY SBST N/A 08/16/2015    Procedure: COLONOSCOPY, FLEXIBLE, PROXIMAL TO SPLENIC FLEXURE; WITH DIRECTED SUBMUCOSAL INJECTION(S), ANY SUBSTANCE;  Surgeon: Gwen Pounds, MD;  Location: [facility];  Service: Gastroenterology   ??? PROSTATE SURGERY          Social History     Occupational History   ??? Occupation: DISABLED   Tobacco Use   ??? Smoking status: Current Some Day Smoker     Packs/day: 0.50     Years: 40.00     Pack years: 20.00     Types: Cigarettes     Start date: 05/09/1994   ??? Smokeless tobacco: Never Used   Substance and Sexual Activity   ??? Alcohol use: Yes     Comment: Pt last drank 6 months   ??? Drug use: Yes     Types: Marijuana   ??? Sexual activity: Not Currently       Family History  Family History   Problem Relation Age of Onset   ??? Heart disease Mother    ??? Diabetes Mother    ??? Diabetes Father    ??? Melanoma Neg Hx    ??? Basal cell carcinoma Neg Hx    ??? Squamous cell carcinoma Neg Hx      Prostate Cancer Family History Assessment:  ? History of cancer in children (yes/no; if yes, what type AND age of diagnosis): ***  ? Total number of siblings (including deceased): ***  ? History of cancer in siblings (yes/no; if yes, provide relation, type of cancer, AND age of diagnosis): ***  ? History of cancer in parents (yes/no; if yes, please specify parent, type of cancer, AND age of diagnosis): ***  ? History of cancer in aunts/uncles/grandparents (yes/no; if yes, provide relation, type of cancer, AND age of diagnosis): ***      Review of Systems:  A comprehensive review of 10 systems was negative except for pertinent positives noted in HPI.    Physical Exam:    VITAL SIGNS:  There were no vitals taken for this visit.  ECOG Performance Status:  GENERAL: Well-developed, well-nourished patient in no acute distress.  HEAD: Normocephalic and atraumatic.  EYES: Conjunctivae are normal. No scleral icterus.  MOUTH/THROAT: Oropharynx is clear and moist.  No mucosal lesions.  NECK: Supple, no thyromegaly.  LYMPHATICS: No palpable cervical, supraclavicular, or axillary adenopathy.  CARDIOVASCULAR: Normal rate, regular rhythm and normal heart sounds.  Exam reveals no gallop and no friction rub.  No murmur heard.  PULMONARY/CHEST: Effort normal and breath sounds normal. No respiratory distress.  ABDOMINAL:  Soft. There is no distension. There is no tenderness. There is no rebound and no guarding.  MUSCULOSKELETAL: No clubbing, cyanosis, or lower extremity edema.  PSYCHIATRIC: Alert and oriented.  Normal mood and affect.  NEUROLOGIC: No focal motor deficit. Normal gait.  SKIN: Skin is warm, dry, and intact.      Results/Orders:    No visits with results within 2 Week(s) from this visit.   Latest known visit with results is:   Clinical Support on 02/08/2020 Component Date Value Ref Range Status   ??? Calcium 02/08/2020 7.8* 8.5 - 10.2 mg/dL Final   ??? Phosphorus 02/08/2020 2.0* 2.9 - 4.7 mg/dL Final       Lab Results   Component Value Date    PSA 730.00 (H) 02/08/2020    PSA  265.00 (H) 12/29/2019    PSA 228.00 (H) 12/01/2019    PSA 144.00 (H) 10/27/2019    PSA 113.00 (H) 09/07/2019    PSA 117.00 (H) 06/28/2019    PSA 103.00 (H) 05/27/2019    PSA 60.20 (H) 04/26/2019    PSA 34.40 (H) 03/29/2019    PSA 18.60 (H) 03/01/2019             Orders placed or performed during the hospital encounter of 02/18/20   ??? CT Abdomen Pelvis W Contrast   ??? CT Abdomen Pelvis W Contrast   ??? CT Chest W Contrast   ??? CT Chest W Contrast     *Note: Due to a large number of results and/or encounters for the requested time period, some results have not been displayed. A complete set of results can be found in Results Review.       Pathology      Imaging results:  Bone scan 01/21/2020  Redemonstrated multiple abnormal foci throughout the appendicular axial skeleton, with interval increase in the extent of the T10 vertebral body lesion and new lesions involving the manubrium, left fifth and seventh ribs. This is most consistent with progression of osseous metastatic disease.    CT CAP 02/18/2020  No new suspicious pulmonary nodule or mass  Diffuse osseous sclerotic metastatic disease, similar to prior.  Otherwise no evidence of metastatic disease in the abdomen/pelvis.

## 2020-02-24 NOTE — Unmapped (Signed)
[name] contacted the [employer] requesting to speak with the care team of Joseph Phillips to discuss:    Patient called to see if he can reschedule todays appointment due to not feeling good and move it to 03/07/2020.  Tried to reschedule but nothing available for that date.    Please contact Clifton Custard at [phone].        Check Indicates criteria has been reviewed and confirmed with the patient:    [x]  Preferred Name   [x]  DOB and/or MR#  [x]  Preferred Contact Method  [x]  Phone Number(s)   []  MyChart     Thank you,   Jacques Navy  [facility]   [phone]

## 2020-02-25 ENCOUNTER — Emergency Department: Payer: Medicaid Other

## 2020-02-25 ENCOUNTER — Emergency Department
Admission: EM | Admit: 2020-02-25 | Discharge: 2020-02-25 | Disposition: A | Discharge status: Home / Self Care | Payer: Medicaid Other | Attending: Student in an Organized Health Care Education/Training Program | Admitting: Student in an Organized Health Care Education/Training Program

## 2020-02-25 ENCOUNTER — Other Ambulatory Visit: Payer: Self-pay

## 2020-02-25 DIAGNOSIS — Z8546 Personal history of malignant neoplasm of prostate: Secondary | ICD-10-CM | POA: Insufficient documentation

## 2020-02-25 DIAGNOSIS — F17210 Nicotine dependence, cigarettes, uncomplicated: Secondary | ICD-10-CM | POA: Diagnosis not present

## 2020-02-25 DIAGNOSIS — I10 Essential (primary) hypertension: Secondary | ICD-10-CM | POA: Diagnosis not present

## 2020-02-25 DIAGNOSIS — J449 Chronic obstructive pulmonary disease, unspecified: Secondary | ICD-10-CM | POA: Insufficient documentation

## 2020-02-25 DIAGNOSIS — Z79899 Other long term (current) drug therapy: Secondary | ICD-10-CM | POA: Diagnosis not present

## 2020-02-25 DIAGNOSIS — R079 Chest pain, unspecified: Secondary | ICD-10-CM | POA: Diagnosis present

## 2020-02-25 LAB — CBC WITH DIFFERENTIAL/PLATELET
Abs Immature Granulocytes: 0.12 10*3/uL — ABNORMAL HIGH (ref 0.00–0.07)
Basophils Absolute: 0.1 10*3/uL (ref 0.0–0.1)
Basophils Relative: 1 %
Eosinophils Absolute: 0.1 10*3/uL (ref 0.0–0.5)
Eosinophils Relative: 1 %
HCT: 27.4 % — ABNORMAL LOW (ref 39.0–52.0)
Hemoglobin: 8.8 g/dL — ABNORMAL LOW (ref 13.0–17.0)
Immature Granulocytes: 2 %
Lymphocytes Relative: 23 %
Lymphs Abs: 1.5 10*3/uL (ref 0.7–4.0)
MCH: 30 pg (ref 26.0–34.0)
MCHC: 32.1 g/dL (ref 30.0–36.0)
MCV: 93.5 fL (ref 80.0–100.0)
Monocytes Absolute: 0.5 10*3/uL (ref 0.1–1.0)
Monocytes Relative: 9 %
Neutro Abs: 4 10*3/uL (ref 1.7–7.7)
Neutrophils Relative %: 64 %
Platelets: 195 10*3/uL (ref 150–400)
RBC: 2.93 MIL/uL — ABNORMAL LOW (ref 4.22–5.81)
RDW: 17.8 % — ABNORMAL HIGH (ref 11.5–15.5)
WBC: 6.2 10*3/uL (ref 4.0–10.5)
nRBC: 0.3 % — ABNORMAL HIGH (ref 0.0–0.2)

## 2020-02-25 LAB — COMPREHENSIVE METABOLIC PANEL
ALT: 15 U/L (ref 0–44)
AST: 21 U/L (ref 15–41)
Albumin: 3.6 g/dL (ref 3.5–5.0)
Alkaline Phosphatase: 368 U/L — ABNORMAL HIGH (ref 38–126)
Anion gap: 9 (ref 5–15)
BUN: 17 mg/dL (ref 8–23)
CO2: 20 mmol/L — ABNORMAL LOW (ref 22–32)
Calcium: 7.4 mg/dL — ABNORMAL LOW (ref 8.9–10.3)
Chloride: 109 mmol/L (ref 98–111)
Creatinine, Ser: 0.5 mg/dL — ABNORMAL LOW (ref 0.61–1.24)
GFR calc Af Amer: >60 mL/min (ref >60)
GFR calc non Af Amer: >60 mL/min (ref >60)
Glucose, Bld: 113 mg/dL — ABNORMAL HIGH (ref 70–99)
Potassium: 3.9 mmol/L (ref 3.5–5.1)
Sodium: 138 mmol/L (ref 135–145)
Total Bilirubin: 0.4 mg/dL (ref 0.3–1.2)
Total Protein: 7.2 g/dL (ref 6.5–8.1)

## 2020-02-25 LAB — TROPONIN I (HIGH SENSITIVITY)
Troponin I (High Sensitivity): 5 ng/L (ref <18)
Troponin I (High Sensitivity): 3 ng/L (ref <18)

## 2020-02-25 LAB — PROTIME-INR
INR: 1.2 (ref 0.8–1.2)
Prothrombin Time: 14.8 seconds (ref 11.4–15.2)

## 2020-02-25 LAB — LIPASE, BLOOD: Lipase: 16 U/L (ref 11–51)

## 2020-02-25 MED ORDER — IOHEXOL 350 MG/ML SOLN
75.0000 mL | Freq: Once | INTRAVENOUS | Status: AC | PRN
  Administered 2020-02-25: 09:00:00 75 mL via INTRAVENOUS

## 2020-02-25 MED ORDER — ONDANSETRON HCL 4 MG/2ML IJ SOLN: 4.0000 mg | Freq: Once | INTRAMUSCULAR | Status: DC

## 2020-02-25 MED ORDER — MORPHINE SULFATE (PF) 4 MG/ML IV SOLN: 4.0000 mg | INTRAVENOUS | Status: DC | PRN

## 2020-02-25 NOTE — ED Notes (Signed)
Pt reports chest is no longer hurting but reports "soreness" in bilateral shoulders and lower back. Dr. Quentin Cornwall informed.

## 2020-02-25 NOTE — ED Provider Notes (Signed)
[facility] Emergency Department Provider Note    First MD Initiated Contact with Patient 02/25/20 [phone]     (approximate)  I have reviewed the triage vital signs and the nursing notes.   HISTORY  Chief Complaint Chest Pain    HPI Alec Rossberg. is a 59 y.o. male with the below listed past medical history presents to the ER for evaluation of chest pain in his back associated nausea and vomiting.  He is currently pain-free.  States he had similar pain in the past.  Kept him up last night.  Did take his morning prescribed pain medication and that controlled his discomfort.  States he also took an aspirin this morning.  Is not had any diarrhea.  No measured fevers.  No cough.  No shortness of breath.    Past Medical History:  Diagnosis Date  . Cancer [facility])    prostate ca  . COPD (chronic obstructive pulmonary disease) ([place])   . Hypertension   . Pancreatitis    Family History  Problem Relation Age of Onset  . Cancer Father        bladder   Past Surgical History:  Procedure Laterality Date  . PROSTATE SURGERY     Patient Active Problem List   Diagnosis Date Noted  . Sepsis ([place]) 01/07/2019      Prior to Admission medications   Medication Sig Start Date End Date Taking? Authorizing Provider  albuterol (PROVENTIL HFA;VENTOLIN HFA) 108 (90 Base) MCG/ACT inhaler Inhale 2 puffs into the lungs every 6 (six) hours as needed for wheezing. 09/26/18   [provider]  b complex vitamins capsule Take 1 capsule by mouth daily.    [provider]  busPIRone (BUSPAR) 5 MG tablet Take 5 mg by mouth 3 (three) times daily. 03/26/18   [provider]  dexamethasone (DECADRON) 4 MG tablet Take 8 mg by mouth 2 (two) times daily. 07/15/18   [provider]  lidocaine (XYLOCAINE) 2 % solution Use as directed 15 mLs in the mouth or throat every 4 (four) hours as needed for mouth pain.    [provider]  magic mouthwash SOLN  Take 10 mLs by mouth 4 (four) times daily as needed. 11/23/18   [provider]  magnesium oxide (MAG-OX) 400 MG tablet Take 400 mg by mouth 2 (two) times daily. 09/09/18   [provider]  naproxen sodium (ALEVE) 220 MG tablet Take 220 mg by mouth 2 (two) times daily as needed (pain).    [provider]  omeprazole (PRILOSEC) 20 MG capsule Take 40 mg by mouth daily. 05/19/18   [provider]  oxyCODONE (OXY IR/ROXICODONE) 5 MG immediate release tablet Take 5 mg by mouth every 6 (six) hours as needed for pain. 12/11/18   [provider]  Pancrelipase, Lip-Prot-Amyl, (CREON) 24000-76000 units CPEP Take 24,000-76,000 Units by mouth 3 (three) times daily. Take 48,000 units of lipase by mouth three times a day with a meal. Take an additional one capsule with snacks.    [provider]  potassium chloride SA (K-DUR,KLOR-CON) 20 MEQ tablet Take 60 mEq by mouth 2 (two) times daily. 11/16/18   [provider]  tiotropium (SPIRIVA) 18 MCG inhalation capsule Place 1 capsule into inhaler and inhale daily. 11/30/18   [provider]  traZODone (DESYREL) 50 MG tablet Take 50 mg by mouth at bedtime as needed for sleep. 11/16/18   [provider]  triamterene-hydrochlorothiazide (MAXZIDE-25) 37.5-25 MG tablet  Take 1 tablet by mouth daily.    [provider]  valACYclovir (VALTREX) 500 MG tablet Take 500 mg by mouth daily. 11/17/18   [provider]    Allergies Penicillins    Social History Social History   Tobacco Use  . Smoking status: Current Some Day Smoker    Packs/day: 0.25    Types: Cigarettes  . Smokeless tobacco: Never Used  Substance Use Topics  . Alcohol use: Yes    Comment: occasional  . Drug use: No    Review of Systems Patient denies headaches, rhinorrhea, blurry vision, numbness, shortness of breath, chest pain, edema, cough, abdominal pain, nausea, vomiting, diarrhea, dysuria, fevers,  rashes or hallucinations unless otherwise stated above in HPI. ____________________________________________   PHYSICAL EXAM:  VITAL SIGNS: Vitals:   02/25/20 0741 02/25/20 0830  BP: 138/73   Pulse: 76 72  Resp: 18 18  Temp: 98.3 F (36.8 C)   SpO2: 100% 98%    Constitutional: Alert and oriented.  Eyes: Conjunctivae are normal.  Head: Atraumatic. Nose: No congestion/rhinnorhea. Mouth/Throat: Mucous membranes are moist.   Neck: No stridor. Painless ROM.  Cardiovascular: Normal rate, regular rhythm. Grossly normal heart sounds.  Good peripheral circulation. Respiratory: Normal respiratory effort.  No retractions. Lungs CTAB. Gastrointestinal: Soft and nontender. No distention. No abdominal bruits. No CVA tenderness. Genitourinary:  Musculoskeletal: No lower extremity tenderness nor edema.  No joint effusions. Neurologic:  Normal speech and language. No gross focal neurologic deficits are appreciated. No facial droop Skin:  Skin is warm, dry and intact. No rash noted. Psychiatric: Mood and affect are normal. Speech and behavior are normal.  ____________________________________________   LABS (all labs ordered are listed, but only abnormal results are displayed)  Results for orders placed or performed during the hospital encounter of 02/25/20 (from the past 24 hour(s))  CBC with Differential/Platelet     Status: Abnormal   Collection Time: 02/25/20  7:51 AM  Result Value Ref Range   WBC 6.2 4.0 - 10.5 K/uL   RBC 2.93 (L) 4.22 - 5.81 MIL/uL   Hemoglobin 8.8 (L) 13.0 - 17.0 g/dL   HCT 27.4 (L) 39.0 - 52.0 %   MCV 93.5 80.0 - 100.0 fL   MCH 30.0 26.0 - 34.0 pg   MCHC 32.1 30.0 - 36.0 g/dL   RDW 17.8 (H) 11.5 - 15.5 %   Platelets 195 150 - 400 K/uL   nRBC 0.3 (H) 0.0 - 0.2 %   Neutrophils Relative % 64 %   Neutro Abs 4.0 1.7 - 7.7 K/uL   Lymphocytes Relative 23 %   Lymphs Abs 1.5 0.7 - 4.0 K/uL   Monocytes Relative 9 %   Monocytes Absolute 0.5 0.1 - 1.0 K/uL    Eosinophils Relative 1 %   Eosinophils Absolute 0.1 0.0 - 0.5 K/uL   Basophils Relative 1 %   Basophils Absolute 0.1 0.0 - 0.1 K/uL   Immature Granulocytes 2 %   Abs Immature Granulocytes 0.12 (H) 0.00 - 0.07 K/uL  Comprehensive metabolic panel     Status: Abnormal   Collection Time: 02/25/20  7:51 AM  Result Value Ref Range   Sodium 138 135 - 145 mmol/L   Potassium 3.9 3.5 - 5.1 mmol/L   Chloride 109 98 - 111 mmol/L   CO2 20 (L) 22 - 32 mmol/L   Glucose, Bld 113 (H) 70 - 99 mg/dL   BUN 17 8 - 23 mg/dL   Creatinine, Ser 0.50 (L) 0.61 - 1.24 mg/dL  Calcium 7.4 (L) 8.9 - 10.3 mg/dL   Total Protein 7.2 6.5 - 8.1 g/dL   Albumin 3.6 3.5 - 5.0 g/dL   AST 21 15 - 41 U/L   ALT 15 0 - 44 U/L   Alkaline Phosphatase 368 (H) 38 - 126 U/L   Total Bilirubin 0.4 0.3 - 1.2 mg/dL   GFR calc non Af Amer >60 >60 mL/min   GFR calc Af Amer >60 >60 mL/min   Anion gap 9 5 - 15  Lipase, blood     Status: None   Collection Time: 02/25/20  7:51 AM  Result Value Ref Range   Lipase 16 11 - 51 U/L  Troponin I (High Sensitivity)     Status: None   Collection Time: 02/25/20  7:51 AM  Result Value Ref Range   Troponin I (High Sensitivity) 5 <18 ng/L  Protime-INR     Status: None   Collection Time: 02/25/20  9:48 AM  Result Value Ref Range   Prothrombin Time 14.8 11.4 - 15.2 seconds   INR 1.2 0.8 - 1.2  Troponin I (High Sensitivity)     Status: None   Collection Time: 02/25/20  9:48 AM  Result Value Ref Range   Troponin I (High Sensitivity) 3 <18 ng/L   ____________________________________________  EKG My review and personal interpretation at Time: 7:48   Indication: chest pain  Rate: 75  Rhythm: sinus Axis: normal Other: normal intervals, no stemi ____________________________________________  RADIOLOGY  I personally reviewed all radiographic images ordered to evaluate for the above acute complaints and reviewed radiology reports and findings.  These findings were personally discussed with the  patient.  Please see medical record for radiology report.  ____________________________________________   PROCEDURES  Procedure(s) performed:  Procedures    Critical Care performed: no ____________________________________________   INITIAL IMPRESSION / ASSESSMENT AND PLAN / ED COURSE  Pertinent labs & imaging results that were available during my care of the patient were reviewed by me and considered in my medical decision making (see chart for details).   DDX: ACS, pericarditis, esophagitis, boerhaaves, pe, dissection, pna, bronchitis, costochondritis   Alec Bihl. is a 59 y.o. who presents to the ED with symptoms as described above.  Patient with known stage IV cancer describing a lot of bone related pain.  Did have brief anterior chest discomfort associate with some shortness of breath this morning therefore will order serial enzymes.  His initial EKG is nonischemic.  Will order CT angiogram to exclude PE.  His abdominal exam is soft benign.  hgb noted to be lower than previous but he denies any melena or hematochezia.  Clinical Course as of Feb 25 1055  Fri Feb 25, 2020  1007 Rectal exam with soft brown nonmelanotic nonbloody stool. Is guaiac negative.   [PR]  1053 Patient reassessed.  Remains hemodynamically stable.  Remains chest pain-free.  Serial enzymes were nonischemic.  Repeat abdominal exam soft and benign.  Is not having evidence of acute blood loss anemia or GI bleed at this time.  No evidence of PE.  As he feels well and is asymptomatic at this time I think he is appropriate for outpatient follow-up.  Have discussed with the patient and available family all diagnostics and treatments performed thus far and all questions were answered to the best of my ability. The patient demonstrates understanding and agreement with plan.    [PR]    Clinical Course User Index [PR] Merlyn Lot, MD    The  patient was evaluated in Emergency Department today for the  symptoms described in the history of present illness. He/she was evaluated in the context of the global COVID-19 pandemic, which necessitated consideration that the patient might be at risk for infection with the SARS-CoV-2 virus that causes COVID-19. Institutional protocols and algorithms that pertain to the evaluation of patients at risk for COVID-19 are in a state of rapid change based on information released by regulatory bodies including the CDC and federal and state organizations. These policies and algorithms were followed during the patient's care in the ED.  As part of my medical decision making, I reviewed the following data within the [place] notes reviewed and incorporated, Labs reviewed, notes from prior ED visits and [place] Controlled Substance Database   ____________________________________________   FINAL CLINICAL IMPRESSION(S) / ED DIAGNOSES  Final diagnoses:  Chest pain, unspecified type      NEW MEDICATIONS STARTED DURING THIS VISIT:  New Prescriptions   No medications on file     Note:  This document was prepared using Dragon voice recognition software and may include unintentional dictation errors.    Merlyn Lot, MD 02/25/20 1056

## 2020-02-25 NOTE — ED Notes (Signed)
Lavender, light green and blue tubes sent to lab

## 2020-02-25 NOTE — ED Triage Notes (Signed)
Pt arrives via [facility] from home for CP that started last night, pt reports it woke him up from sleep associated with nausea and vomiting, reports vomited 2x, last episode of emesis was around 2AM. Pt took 325 ASA at home. VSS, EKG unremarkable in route. No cardiac hx, hx HTN, COPD, stage 4 cancer which he is getting shots for. A&Ox4 and in NAD at this time, reports pain 7/10 in central chest.

## 2020-02-25 NOTE — ED Notes (Signed)
Pt transported to CT ?

## 2020-02-28 MED FILL — METHYLPREDNISOLONE 4 MG TABLET: ORAL | 30 days supply | Qty: 60 | Fill #3

## 2020-02-28 MED FILL — METHYLPREDNISOLONE 4 MG TABLET: 30 days supply | Qty: 60 | Fill #3 | Status: AC

## 2020-03-07 ENCOUNTER — Other Ambulatory Visit: Admit: 2020-03-07 | Discharge: 2020-03-07 | Payer: MEDICAID

## 2020-03-07 ENCOUNTER — Institutional Professional Consult (permissible substitution): Admit: 2020-03-07 | Discharge: 2020-03-07 | Payer: MEDICAID

## 2020-03-07 ENCOUNTER — Ambulatory Visit
Admit: 2020-03-07 | Discharge: 2020-03-07 | Payer: MEDICAID | Attending: Hematology & Oncology | Primary: Hematology & Oncology

## 2020-03-07 DIAGNOSIS — C61 Malignant neoplasm of prostate: Principal | ICD-10-CM

## 2020-03-07 MED ORDER — OXYCODONE 10 MG TABLET
ORAL_TABLET | ORAL | 0 refills | 34 days | Status: CP | PRN
Start: 2020-03-07 — End: ?

## 2020-03-07 MED ORDER — OMEPRAZOLE 20 MG CAPSULE,DELAYED RELEASE
ORAL_CAPSULE | Freq: Every day | ORAL | 3 refills | 90 days | Status: CP
Start: 2020-03-07 — End: ?

## 2020-03-13 ENCOUNTER — Ambulatory Visit: Admit: 2020-03-13 | Discharge: 2020-03-26 | Payer: MEDICAID

## 2020-03-13 ENCOUNTER — Ambulatory Visit: Admit: 2020-03-13 | Discharge: 2020-04-11 | Payer: MEDICAID

## 2020-03-16 ENCOUNTER — Emergency Department: Payer: Medicaid Other

## 2020-03-16 ENCOUNTER — Emergency Department
Admission: EM | Admit: 2020-03-16 | Discharge: 2020-03-16 | Disposition: A | Discharge status: Home / Self Care | Payer: Medicaid Other | Attending: Emergency Medicine | Admitting: Emergency Medicine

## 2020-03-16 ENCOUNTER — Other Ambulatory Visit: Payer: Self-pay

## 2020-03-16 DIAGNOSIS — K529 Noninfective gastroenteritis and colitis, unspecified: Secondary | ICD-10-CM | POA: Insufficient documentation

## 2020-03-16 DIAGNOSIS — I10 Essential (primary) hypertension: Secondary | ICD-10-CM | POA: Insufficient documentation

## 2020-03-16 DIAGNOSIS — Z79899 Other long term (current) drug therapy: Secondary | ICD-10-CM | POA: Insufficient documentation

## 2020-03-16 DIAGNOSIS — J449 Chronic obstructive pulmonary disease, unspecified: Secondary | ICD-10-CM | POA: Insufficient documentation

## 2020-03-16 DIAGNOSIS — R1084 Generalized abdominal pain: Secondary | ICD-10-CM | POA: Diagnosis present

## 2020-03-16 DIAGNOSIS — R112 Nausea with vomiting, unspecified: Secondary | ICD-10-CM | POA: Insufficient documentation

## 2020-03-16 DIAGNOSIS — F17210 Nicotine dependence, cigarettes, uncomplicated: Secondary | ICD-10-CM | POA: Diagnosis not present

## 2020-03-16 LAB — COMPREHENSIVE METABOLIC PANEL
ALT: 20 U/L (ref 0–44)
AST: 33 U/L (ref 15–41)
Albumin: 3.9 g/dL (ref 3.5–5.0)
Alkaline Phosphatase: 321 U/L — ABNORMAL HIGH (ref 38–126)
Anion gap: 9 (ref 5–15)
BUN: 20 mg/dL (ref 8–23)
CO2: 19 mmol/L — ABNORMAL LOW (ref 22–32)
Calcium: 8 mg/dL — ABNORMAL LOW (ref 8.9–10.3)
Chloride: 107 mmol/L (ref 98–111)
Creatinine, Ser: 0.57 mg/dL — ABNORMAL LOW (ref 0.61–1.24)
GFR calc Af Amer: >60 mL/min (ref >60)
GFR calc non Af Amer: >60 mL/min (ref >60)
Glucose, Bld: 116 mg/dL — ABNORMAL HIGH (ref 70–99)
Potassium: 4.4 mmol/L (ref 3.5–5.1)
Sodium: 135 mmol/L (ref 135–145)
Total Bilirubin: 0.8 mg/dL (ref 0.3–1.2)
Total Protein: 7.7 g/dL (ref 6.5–8.1)

## 2020-03-16 LAB — CBC
HCT: 27.8 % — ABNORMAL LOW (ref 39.0–52.0)
Hemoglobin: 9.2 g/dL — ABNORMAL LOW (ref 13.0–17.0)
MCH: 30.3 pg (ref 26.0–34.0)
MCHC: 33.1 g/dL (ref 30.0–36.0)
MCV: 91.4 fL (ref 80.0–100.0)
Platelets: 192 10*3/uL (ref 150–400)
RBC: 3.04 MIL/uL — ABNORMAL LOW (ref 4.22–5.81)
RDW: 17.9 % — ABNORMAL HIGH (ref 11.5–15.5)
WBC: 7.3 10*3/uL (ref 4.0–10.5)
nRBC: 0.5 % — ABNORMAL HIGH (ref 0.0–0.2)

## 2020-03-16 LAB — URINALYSIS, COMPLETE (UACMP) WITH MICROSCOPIC
Bilirubin Urine: NEGATIVE
Glucose, UA: NEGATIVE mg/dL
Ketones, ur: NEGATIVE mg/dL
Leukocytes,Ua: NEGATIVE
Nitrite: NEGATIVE
Protein, ur: NEGATIVE mg/dL
Specific Gravity, Urine: 1.02 (ref 1.005–1.030)
pH: 6.5 (ref 5.0–8.0)

## 2020-03-16 LAB — LIPASE, BLOOD: Lipase: 18 U/L (ref 11–51)

## 2020-03-16 MED ORDER — DIPHENHYDRAMINE HCL 50 MG/ML IJ SOLN
25.0000 mg | Freq: Once | INTRAMUSCULAR | Status: AC
  Administered 2020-03-16: 18:00:00 25 mg via INTRAVENOUS
  Filled 2020-03-16: qty 1

## 2020-03-16 MED ORDER — MORPHINE SULFATE (PF) 4 MG/ML IV SOLN
4.0000 mg | Freq: Once | INTRAVENOUS | Status: AC
  Administered 2020-03-16: 4 mg via INTRAVENOUS
  Filled 2020-03-16: qty 1

## 2020-03-16 MED ORDER — HALOPERIDOL LACTATE 5 MG/ML IJ SOLN
5.0000 mg | Freq: Once | INTRAMUSCULAR | Status: AC
  Administered 2020-03-16: 17:00:00 5 mg via INTRAVENOUS
  Filled 2020-03-16: qty 1

## 2020-03-16 MED ORDER — METRONIDAZOLE 500 MG PO TABS: 500.0000 mg | ORAL_TABLET | Freq: Three times a day (TID) | ORAL | 0 refills | Status: AC

## 2020-03-16 MED ORDER — SODIUM CHLORIDE 0.9% FLUSH: 3.0000 mL | Freq: Once | INTRAVENOUS | Status: DC

## 2020-03-16 MED ORDER — CIPROFLOXACIN HCL 500 MG PO TABS: 500.0000 mg | ORAL_TABLET | Freq: Two times a day (BID) | ORAL | 0 refills | Status: AC

## 2020-03-16 MED ORDER — IOHEXOL 300 MG/ML  SOLN
100.0000 mL | Freq: Once | INTRAMUSCULAR | Status: AC | PRN
  Administered 2020-03-16: 17:00:00 100 mL via INTRAVENOUS

## 2020-03-16 MED ORDER — SODIUM CHLORIDE 0.9 % IV BOLUS
1000.0000 mL | Freq: Once | INTRAVENOUS | Status: AC
  Administered 2020-03-16: 15:00:00 1000 mL via INTRAVENOUS

## 2020-03-16 MED ORDER — ONDANSETRON HCL 4 MG/2ML IJ SOLN
4.0000 mg | Freq: Once | INTRAMUSCULAR | Status: AC
  Administered 2020-03-16: 4 mg via INTRAVENOUS
  Filled 2020-03-16: qty 2

## 2020-03-16 NOTE — ED Triage Notes (Signed)
Pt has stage 4 bone CA and over the past 2 days have a flare up with pancreatitis with severe abd pain with N/V.Marland Kitchen pt arrives with fentanyl patch in place on arrival.

## 2020-03-16 NOTE — Discharge Instructions (Addendum)
Please seek medical attention for any high fevers, chest pain, shortness of breath, change in behavior, persistent vomiting, bloody stool or any other new or concerning symptoms.  

## 2020-03-16 NOTE — ED Provider Notes (Signed)
[facility] Emergency Department Provider Note  ____________________________________________   I have reviewed the triage vital signs and the nursing notes.   HISTORY  Chief Complaint Abdominal Pain   History limited by: Not Limited   HPI Alec Hart. is a 59 y.o. male who presents to the emergency department today because of concern for abdominal pain. Located in the upper abdomin. Started a few days ago. Was initially accompanied by nausea and vomiting. This was non bloody. Patient has had similar pain in the past with pancreatitis. He is on home oxycodone which he states has not helped with the pain. Denies any unusual ingestions. Denies any alcohol use.    Records reviewed. Per medical record review patient has a history of pancreatitis, prostate CA.   Past Medical History:  Diagnosis Date  . Cancer [facility])    prostate ca  . COPD (chronic obstructive pulmonary disease) ([place])   . Hypertension   . Pancreatitis     Patient Active Problem List   Diagnosis Date Noted  . Sepsis ([place]) 01/07/2019    Past Surgical History:  Procedure Laterality Date  . PROSTATE SURGERY      Prior to Admission medications   Medication Sig Start Date End Date Taking? Authorizing Provider  albuterol (PROVENTIL HFA;VENTOLIN HFA) 108 (90 Base) MCG/ACT inhaler Inhale 2 puffs into the lungs every 6 (six) hours as needed for wheezing. 09/26/18   [provider]  b complex vitamins capsule Take 1 capsule by mouth daily.    [provider]  busPIRone (BUSPAR) 5 MG tablet Take 5 mg by mouth 3 (three) times daily. 03/26/18   [provider]  dexamethasone (DECADRON) 4 MG tablet Take 8 mg by mouth 2 (two) times daily. 07/15/18   [provider]  lidocaine (XYLOCAINE) 2 % solution Use as directed 15 mLs in the mouth or throat every 4 (four) hours as needed for mouth pain.    [provider]  magic mouthwash SOLN Take 10 mLs by mouth 4  (four) times daily as needed. 11/23/18   [provider]  magnesium oxide (MAG-OX) 400 MG tablet Take 400 mg by mouth 2 (two) times daily. 09/09/18   [provider]  naproxen sodium (ALEVE) 220 MG tablet Take 220 mg by mouth 2 (two) times daily as needed (pain).    [provider]  omeprazole (PRILOSEC) 20 MG capsule Take 40 mg by mouth daily. 05/19/18   [provider]  oxyCODONE (OXY IR/ROXICODONE) 5 MG immediate release tablet Take 5 mg by mouth every 6 (six) hours as needed for pain. 12/11/18   [provider]  Pancrelipase, Lip-Prot-Amyl, (CREON) 24000-76000 units CPEP Take 24,000-76,000 Units by mouth 3 (three) times daily. Take 48,000 units of lipase by mouth three times a day with a meal. Take an additional one capsule with snacks.    [provider]  potassium chloride SA (K-DUR,KLOR-CON) 20 MEQ tablet Take 60 mEq by mouth 2 (two) times daily. 11/16/18   [provider]  tiotropium (SPIRIVA) 18 MCG inhalation capsule Place 1 capsule into inhaler and inhale daily. 11/30/18   [provider]  traZODone (DESYREL) 50 MG tablet Take 50 mg by mouth at bedtime as needed for sleep. 11/16/18   [provider]  triamterene-hydrochlorothiazide (MAXZIDE-25) 37.5-25 MG tablet Take 1 tablet by mouth daily.    [provider]  valACYclovir (VALTREX) 500 MG tablet Take 500 mg by mouth daily. 11/17/18   [provider]  Allergies Penicillins  Family History  Problem Relation Age of Onset  . Cancer Father        bladder    Social History Social History   Tobacco Use  . Smoking status: Current Some Day Smoker    Packs/day: 0.25    Types: Cigarettes  . Smokeless tobacco: Never Used  Substance Use Topics  . Alcohol use: Yes    Comment: occasional  . Drug use: No    Review of Systems Constitutional: No fever/chills Eyes: No visual changes. ENT: No sore throat. Cardiovascular: Denies chest  pain. Respiratory: Denies shortness of breath. Gastrointestinal: Positive for abdominal pain. Positive for nausea and vomiting.  Genitourinary: Negative for dysuria. Musculoskeletal: Negative for back pain. Skin: Negative for rash. Neurological: Negative for headaches, focal weakness or numbness.  ____________________________________________   PHYSICAL EXAM:  VITAL SIGNS: ED Triage Vitals [03/16/20 1422]  Enc Vitals Group     BP      Pulse      Resp      Temp      Temp src      SpO2      Weight 140 lb (63.5 kg)     Height 5' 8" (1.727 m)     Head Circumference      Peak Flow      Pain Score 10   Constitutional: Alert and oriented.  Eyes: Conjunctivae are normal.  ENT      Head: Normocephalic and atraumatic.      Nose: No congestion/rhinnorhea.      Mouth/Throat: Mucous membranes are moist.      Neck: No stridor. Hematological/Lymphatic/Immunilogical: No cervical lymphadenopathy. Cardiovascular: Normal rate, regular rhythm.  No murmurs, rubs, or gallops.  Respiratory: Normal respiratory effort without tachypnea nor retractions. Breath sounds are clear and equal bilaterally. No wheezes/rales/rhonchi. Gastrointestinal: Soft and tender to the abdomin.  Genitourinary: Deferred Musculoskeletal: Normal range of motion in all extremities. No lower extremity edema. Neurologic:  Normal speech and language. No gross focal neurologic deficits are appreciated.  Skin:  Skin is warm, dry and intact. No rash noted. Psychiatric: Mood and affect are normal. Speech and behavior are normal. Patient exhibits appropriate insight and judgment.  ____________________________________________    LABS (pertinent positives/negatives)  CMP wnl except co2 19, glu 116, cr 0.57, ca 8.0, alk phos 321 UA trace hgb dipstick, rare bacteria otherwise wnl Lipase 18 CBC wbc 7.3, hgb 9.2, plt  192  ____________________________________________   EKG  None  ____________________________________________    RADIOLOGY  CT abd/pel Pancolitis  ____________________________________________   PROCEDURES  Procedures  ____________________________________________   INITIAL IMPRESSION / ASSESSMENT AND PLAN / ED COURSE  Pertinent labs & imaging results that were available during my care of the patient were reviewed by me and considered in my medical decision making (see chart for details).   Patient presented to the emergency department today because of concerns for significant abdominal pain.  Patient states he has a history of pancreatitis and this feels similar to him.  On exam patient's abdomen was tender however soft.  No rebound or guarding.  Did obtain a CT scan which is consistent with pancolitis.  Patient without any leukocytosis or fever here.  I discussed this finding with the patient.  Will plan on starting antibiotics on an abundance of caution.  Discussed with patient importance of following up with primary care.  Patient did feel better after medications given here in the emergency department.  ____________________________________________   FINAL CLINICAL IMPRESSION(S) / ED DIAGNOSES  Final diagnoses:  Generalized abdominal pain  Colitis     Note: This dictation was prepared with Dragon dictation. Any transcriptional errors that result from this process are unintentional     Nance Pear, MD 03/16/20 2027

## 2020-03-16 NOTE — ED Notes (Signed)
Warm blankets provided to pt by this Rn.

## 2020-03-16 NOTE — ED Notes (Signed)
Pt had 3x episodes of diarrhea.

## 2020-03-16 NOTE — ED Notes (Signed)
Pt taken to CT.

## 2020-03-16 NOTE — ED Notes (Addendum)
Pt walking around in the room, st  "I feel funny, I can't breath". This Rn advised pt to lay back on stretcher to assess pt.  Pt sitting on stretcher at this time with monitor on.  Pt ST on the monitor at 126bpm. Sating at 99% RA. EDP Archie Balboa made aware.

## 2020-03-16 NOTE — ED Notes (Signed)
Pt ambulated to toilet to have a BM. Pt safety back on stretcher with monitor in place.

## 2020-03-16 NOTE — ED Notes (Signed)
This RN notified CT of pending order.

## 2020-03-16 NOTE — ED Notes (Signed)
Pt abck from CT. Pt able to ambulate with a steady gait to toilet to void.

## 2020-03-20 DIAGNOSIS — C61 Malignant neoplasm of prostate: Principal | ICD-10-CM

## 2020-03-20 MED ORDER — FENTANYL 100 MCG/HR TRANSDERMAL PATCH
MEDICATED_PATCH | TRANSDERMAL | 0 refills | 30 days | Status: CP
Start: 2020-03-20 — End: 2021-03-20

## 2020-03-28 ENCOUNTER — Ambulatory Visit
Admit: 2020-03-28 | Discharge: 2020-03-29 | Payer: MEDICAID | Attending: Hematology & Oncology | Primary: Hematology & Oncology

## 2020-03-28 ENCOUNTER — Ambulatory Visit: Admit: 2020-03-28 | Discharge: 2020-03-29 | Payer: MEDICAID

## 2020-03-28 ENCOUNTER — Other Ambulatory Visit: Admit: 2020-03-28 | Discharge: 2020-03-29 | Payer: MEDICAID

## 2020-03-28 DIAGNOSIS — C61 Malignant neoplasm of prostate: Principal | ICD-10-CM

## 2020-03-28 DIAGNOSIS — I10 Essential (primary) hypertension: Principal | ICD-10-CM

## 2020-03-28 DIAGNOSIS — C7951 Secondary malignant neoplasm of bone: Principal | ICD-10-CM

## 2020-03-28 MED ORDER — AMLODIPINE 10 MG TABLET
ORAL_TABLET | Freq: Every day | ORAL | 11 refills | 30 days | Status: CP
Start: 2020-03-28 — End: ?

## 2020-03-28 MED ORDER — PROCHLORPERAZINE MALEATE 10 MG TABLET
ORAL_TABLET | Freq: Four times a day (QID) | ORAL | 2 refills | 8.00000 days | Status: CP | PRN
Start: 2020-03-28 — End: 2021-03-28

## 2020-03-29 ENCOUNTER — Institutional Professional Consult (permissible substitution)
Admit: 2020-03-29 | Discharge: 2020-03-30 | Payer: MEDICAID | Attending: Student in an Organized Health Care Education/Training Program | Primary: Student in an Organized Health Care Education/Training Program

## 2020-03-29 MED ORDER — MISCELLANEOUS MEDICAL SUPPLY MISC
0 refills | 0.00000 days | Status: CP
Start: 2020-03-29 — End: 2020-03-29

## 2020-04-03 MED ORDER — OXYCODONE 10 MG TABLET
ORAL_TABLET | ORAL | 0 refills | 19 days | Status: CP | PRN
Start: 2020-04-03 — End: ?

## 2020-04-05 DIAGNOSIS — J449 Chronic obstructive pulmonary disease, unspecified: Principal | ICD-10-CM

## 2020-04-05 MED ORDER — SALMETEROL 50 MCG/DOSE BLISTER POWDER FOR INHALATION
Freq: Two times a day (BID) | RESPIRATORY_TRACT | 0 refills | 0 days | Status: CP
Start: 2020-04-05 — End: 2021-04-05

## 2020-04-05 MED ORDER — SPIRIVA WITH HANDIHALER 18 MCG AND INHALATION CAPSULES
ORAL_CAPSULE | Freq: Every day | RESPIRATORY_TRACT | 3 refills | 90.00000 days | Status: CP
Start: 2020-04-05 — End: ?

## 2020-04-05 MED ORDER — ALBUTEROL SULFATE HFA 90 MCG/ACTUATION AEROSOL INHALER
0 refills | 0 days | Status: CP
Start: 2020-04-05 — End: ?

## 2020-04-07 DIAGNOSIS — J449 Chronic obstructive pulmonary disease, unspecified: Principal | ICD-10-CM

## 2020-04-07 MED ORDER — ALBUTEROL SULFATE 2.5 MG/3 ML (0.083 %) SOLUTION FOR NEBULIZATION
Freq: Four times a day (QID) | RESPIRATORY_TRACT | 0 refills | 15.00000 days | Status: CP | PRN
Start: 2020-04-07 — End: 2021-04-07

## 2020-04-07 MED ORDER — OXYCODONE 10 MG TABLET
ORAL_TABLET | ORAL | 0 refills | 34 days | Status: CP | PRN
Start: 2020-04-07 — End: ?

## 2020-04-07 MED ORDER — CALCIUM CARBONATE 600 MG (1,500 MG)-VITAMIN D3 400 UNIT TABLET
ORAL | 0 days
Start: 2020-04-07 — End: ?

## 2020-04-12 DIAGNOSIS — C61 Malignant neoplasm of prostate: Principal | ICD-10-CM

## 2020-04-12 MED ORDER — GABAPENTIN 100 MG CAPSULE
ORAL_CAPSULE | Freq: Every evening | ORAL | 11 refills | 90 days | Status: CP
Start: 2020-04-12 — End: ?

## 2020-04-14 ENCOUNTER — Ambulatory Visit: Admit: 2020-04-14 | Discharge: 2020-05-11 | Payer: MEDICAID

## 2020-04-14 ENCOUNTER — Ambulatory Visit: Admit: 2020-04-14 | Discharge: 2020-04-23 | Payer: MEDICAID

## 2020-04-18 ENCOUNTER — Ambulatory Visit
Admit: 2020-04-18 | Discharge: 2020-04-19 | Payer: MEDICAID | Attending: Hematology & Oncology | Primary: Hematology & Oncology

## 2020-04-18 ENCOUNTER — Other Ambulatory Visit: Admit: 2020-04-18 | Discharge: 2020-04-19 | Payer: MEDICAID

## 2020-04-18 ENCOUNTER — Ambulatory Visit: Admit: 2020-04-18 | Discharge: 2020-04-19 | Payer: MEDICAID

## 2020-04-18 DIAGNOSIS — C61 Malignant neoplasm of prostate: Principal | ICD-10-CM

## 2020-04-18 DIAGNOSIS — C7951 Secondary malignant neoplasm of bone: Principal | ICD-10-CM

## 2020-04-18 MED ORDER — FENTANYL 25 MCG/HR TRANSDERMAL PATCH
MEDICATED_PATCH | TRANSDERMAL | 0 refills | 30.00000 days | Status: CP
Start: 2020-04-18 — End: 2020-05-18

## 2020-04-18 MED ORDER — PREDNISONE 5 MG TABLET
ORAL_TABLET | Freq: Two times a day (BID) | ORAL | 8 refills | 30.00000 days | Status: CP
Start: 2020-04-18 — End: ?

## 2020-04-18 MED ORDER — FENTANYL 100 MCG/HR TRANSDERMAL PATCH
MEDICATED_PATCH | TRANSDERMAL | 0 refills | 30.00000 days | Status: CP
Start: 2020-04-18 — End: 2020-05-18

## 2020-04-18 MED ORDER — NALOXONE 4 MG/ACTUATION NASAL SPRAY
NASAL | 0 refills | 0.00000 days | Status: CP
Start: 2020-04-18 — End: ?

## 2020-04-18 MED ORDER — OXYCODONE 10 MG TABLET
ORAL_TABLET | ORAL | 0 refills | 18.00000 days | Status: CP | PRN
Start: 2020-04-18 — End: 2020-05-06

## 2020-04-19 ENCOUNTER — Emergency Department: Admit: 2020-04-19 | Discharge: 2020-04-19 | Disposition: A | Discharge status: Home / Self Care | Payer: MEDICAID

## 2020-04-19 ENCOUNTER — Ambulatory Visit: Admit: 2020-04-19 | Discharge: 2020-04-19 | Disposition: A | Discharge status: Home / Self Care | Payer: MEDICAID

## 2020-04-19 DIAGNOSIS — T451X5A Adverse effect of antineoplastic and immunosuppressive drugs, initial encounter: Principal | ICD-10-CM

## 2020-04-19 DIAGNOSIS — C7951 Secondary malignant neoplasm of bone: Principal | ICD-10-CM

## 2020-04-19 DIAGNOSIS — R112 Nausea with vomiting, unspecified: Principal | ICD-10-CM

## 2020-04-19 DIAGNOSIS — R110 Nausea: Principal | ICD-10-CM

## 2020-04-19 MED ORDER — ONDANSETRON 4 MG DISINTEGRATING TABLET
ORAL_TABLET | Freq: Three times a day (TID) | ORAL | 0 refills | 5.00000 days | Status: CP | PRN
Start: 2020-04-19 — End: 2020-04-26

## 2020-04-21 DIAGNOSIS — M8949 Other hypertrophic osteoarthropathy, multiple sites: Principal | ICD-10-CM

## 2020-05-04 DIAGNOSIS — R110 Nausea: Principal | ICD-10-CM

## 2020-05-04 MED ORDER — ONDANSETRON 8 MG DISINTEGRATING TABLET
ORAL_TABLET | Freq: Three times a day (TID) | ORAL | 1 refills | 20.00000 days | Status: CP | PRN
Start: 2020-05-04 — End: ?

## 2020-05-09 ENCOUNTER — Other Ambulatory Visit: Admit: 2020-05-09 | Discharge: 2020-05-10 | Payer: MEDICAID

## 2020-05-09 ENCOUNTER — Ambulatory Visit: Admit: 2020-05-09 | Discharge: 2020-05-10 | Payer: MEDICAID

## 2020-05-09 ENCOUNTER — Ambulatory Visit
Admit: 2020-05-09 | Discharge: 2020-05-10 | Payer: MEDICAID | Attending: Hematology & Oncology | Primary: Hematology & Oncology

## 2020-05-09 DIAGNOSIS — C61 Malignant neoplasm of prostate: Principal | ICD-10-CM

## 2020-05-09 DIAGNOSIS — D649 Anemia, unspecified: Principal | ICD-10-CM

## 2020-05-09 DIAGNOSIS — C7951 Secondary malignant neoplasm of bone: Principal | ICD-10-CM

## 2020-05-09 MED ORDER — FENTANYL 25 MCG/HR TRANSDERMAL PATCH
MEDICATED_PATCH | TRANSDERMAL | 0 refills | 30.00000 days | Status: CP
Start: 2020-05-09 — End: 2020-06-08

## 2020-05-09 MED ORDER — OXYCODONE 10 MG TABLET
ORAL_TABLET | ORAL | 0 refills | 23.00000 days | Status: CP | PRN
Start: 2020-05-09 — End: 2020-06-08

## 2020-05-09 MED ORDER — FENTANYL 100 MCG/HR TRANSDERMAL PATCH
MEDICATED_PATCH | TRANSDERMAL | 0 refills | 30.00000 days | Status: CP
Start: 2020-05-09 — End: 2020-06-08

## 2020-05-10 DIAGNOSIS — C61 Malignant neoplasm of prostate: Principal | ICD-10-CM

## 2020-05-11 MED ORDER — CHOLECALCIFEROL (VITAMIN D3) 1,250 MCG (50,000 UNIT) CAPSULE
0 days
Start: 2020-05-11 — End: ?

## 2020-05-13 ENCOUNTER — Encounter: Payer: Self-pay | Admitting: Emergency Medicine

## 2020-05-13 ENCOUNTER — Emergency Department
Admission: EM | Admit: 2020-05-13 | Discharge: 2020-05-13 | Disposition: A | Discharge status: Home / Self Care | Payer: Medicaid Other | Attending: Emergency Medicine | Admitting: Emergency Medicine

## 2020-05-13 ENCOUNTER — Other Ambulatory Visit: Payer: Self-pay

## 2020-05-13 DIAGNOSIS — Z9221 Personal history of antineoplastic chemotherapy: Secondary | ICD-10-CM | POA: Insufficient documentation

## 2020-05-13 DIAGNOSIS — J449 Chronic obstructive pulmonary disease, unspecified: Secondary | ICD-10-CM | POA: Insufficient documentation

## 2020-05-13 DIAGNOSIS — C61 Malignant neoplasm of prostate: Secondary | ICD-10-CM | POA: Diagnosis not present

## 2020-05-13 DIAGNOSIS — C7951 Secondary malignant neoplasm of bone: Secondary | ICD-10-CM | POA: Insufficient documentation

## 2020-05-13 DIAGNOSIS — R531 Weakness: Secondary | ICD-10-CM | POA: Insufficient documentation

## 2020-05-13 DIAGNOSIS — F17210 Nicotine dependence, cigarettes, uncomplicated: Secondary | ICD-10-CM | POA: Diagnosis not present

## 2020-05-13 DIAGNOSIS — I10 Essential (primary) hypertension: Secondary | ICD-10-CM | POA: Diagnosis not present

## 2020-05-13 DIAGNOSIS — M7918 Myalgia, other site: Secondary | ICD-10-CM | POA: Diagnosis present

## 2020-05-13 DIAGNOSIS — G893 Neoplasm related pain (acute) (chronic): Secondary | ICD-10-CM | POA: Diagnosis not present

## 2020-05-13 LAB — BASIC METABOLIC PANEL
Anion gap: 11 (ref 5–15)
BUN: 19 mg/dL (ref 8–23)
CO2: 19 mmol/L — ABNORMAL LOW (ref 22–32)
Calcium: 7.6 mg/dL — ABNORMAL LOW (ref 8.9–10.3)
Chloride: 104 mmol/L (ref 98–111)
Creatinine, Ser: 0.7 mg/dL (ref 0.61–1.24)
GFR calc Af Amer: >60 mL/min (ref >60)
GFR calc non Af Amer: >60 mL/min (ref >60)
Glucose, Bld: 96 mg/dL (ref 70–99)
Potassium: 4 mmol/L (ref 3.5–5.1)
Sodium: 134 mmol/L — ABNORMAL LOW (ref 135–145)

## 2020-05-13 LAB — TYPE AND SCREEN
ABO/RH(D): B POS
Antibody Screen: NEGATIVE

## 2020-05-13 LAB — URINALYSIS, COMPLETE (UACMP) WITH MICROSCOPIC
Bacteria, UA: NONE SEEN
Bilirubin Urine: NEGATIVE
Glucose, UA: NEGATIVE mg/dL
Hgb urine dipstick: NEGATIVE
Ketones, ur: NEGATIVE mg/dL
Leukocytes,Ua: NEGATIVE
Nitrite: NEGATIVE
Protein, ur: NEGATIVE mg/dL
Specific Gravity, Urine: 1.011 (ref 1.005–1.030)
pH: 7 (ref 5.0–8.0)

## 2020-05-13 LAB — CBC
HCT: 21.7 % — ABNORMAL LOW (ref 39.0–52.0)
Hemoglobin: 7.2 g/dL — ABNORMAL LOW (ref 13.0–17.0)
MCH: 31.9 pg (ref 26.0–34.0)
MCHC: 33.2 g/dL (ref 30.0–36.0)
MCV: 96 fL (ref 80.0–100.0)
Platelets: 140 10*3/uL — ABNORMAL LOW (ref 150–400)
RBC: 2.26 MIL/uL — ABNORMAL LOW (ref 4.22–5.81)
RDW: 16.4 % — ABNORMAL HIGH (ref 11.5–15.5)
WBC: 17.9 10*3/uL — ABNORMAL HIGH (ref 4.0–10.5)
nRBC: 0.6 % — ABNORMAL HIGH (ref 0.0–0.2)

## 2020-05-13 MED ORDER — SODIUM CHLORIDE 0.9 % IV SOLN: Freq: Once | INTRAVENOUS | Status: AC

## 2020-05-13 MED ORDER — HYDROMORPHONE HCL 1 MG/ML IJ SOLN
1.0000 mg | Freq: Once | INTRAMUSCULAR | Status: AC
  Administered 2020-05-13: 1 mg via INTRAVENOUS
  Filled 2020-05-13: qty 1

## 2020-05-13 MED ORDER — HEPARIN SOD (PORK) LOCK FLUSH 100 UNIT/ML IV SOLN
500.0000 [IU] | Freq: Once | INTRAVENOUS | Status: AC | PRN
  Administered 2020-05-13: 500 [IU] via INTRAVENOUS
  Filled 2020-05-13: qty 5

## 2020-05-13 MED ORDER — SODIUM CHLORIDE 0.9 % IV BOLUS
1000.0000 mL | Freq: Once | INTRAVENOUS | Status: AC
  Administered 2020-05-13: 1000 mL via INTRAVENOUS

## 2020-05-13 MED ORDER — SODIUM CHLORIDE 0.9% FLUSH: 3.0000 mL | Freq: Once | INTRAVENOUS | Status: DC

## 2020-05-13 NOTE — ED Provider Notes (Signed)
[facility] Emergency Department Provider Note  Time seen: 9:09 AM  I have reviewed the triage vital signs and the nursing notes.   HISTORY  Chief Complaint Generalized Body Aches and Weakness   HPI Alec Hart. is a 59 y.o. male with a past medical history of prostate cancer with bone metastases, COPD, hypertension, pancreatitis presents to the emergency department for diffuse body pains.  According to the patient he has prostate cancer with bone metastases, takes chemotherapy as well as chronic pain medication.  Patient states he has had increased weakness over the past few days, was told this week during his chemotherapy session that he will need a blood transfusion this coming Tuesday.  All the patient's care was performed at [facility].  Patient states his pain became too bad at home, that his home pain medications were not helping so he came to the emergency department.  Here the patient is in moderate distress, writhing around due to generalized body pains per patient.  Patient also states he feels weak and that he might need a blood transfusion today.  Denies any fever.  Largely negative review of systems otherwise.   Past Medical History:  Diagnosis Date  . Cancer [facility])    prostate ca  . COPD (chronic obstructive pulmonary disease) ([place])   . Hypertension   . Pancreatitis     Patient Active Problem List   Diagnosis Date Noted  . Sepsis ([place]) 01/07/2019    Past Surgical History:  Procedure Laterality Date  . PROSTATE SURGERY      Prior to Admission medications   Medication Sig Start Date End Date Taking? Authorizing Provider  albuterol (PROVENTIL HFA;VENTOLIN HFA) 108 (90 Base) MCG/ACT inhaler Inhale 2 puffs into the lungs every 6 (six) hours as needed for wheezing. 09/26/18   [provider]  b complex vitamins capsule Take 1 capsule by mouth daily.    [provider]  busPIRone (BUSPAR) 5 MG tablet Take 5 mg by mouth 3  (three) times daily. 03/26/18   [provider]  dexamethasone (DECADRON) 4 MG tablet Take 8 mg by mouth 2 (two) times daily. 07/15/18   [provider]  lidocaine (XYLOCAINE) 2 % solution Use as directed 15 mLs in the mouth or throat every 4 (four) hours as needed for mouth pain.    [provider]  magic mouthwash SOLN Take 10 mLs by mouth 4 (four) times daily as needed. 11/23/18   [provider]  magnesium oxide (MAG-OX) 400 MG tablet Take 400 mg by mouth 2 (two) times daily. 09/09/18   [provider]  naproxen sodium (ALEVE) 220 MG tablet Take 220 mg by mouth 2 (two) times daily as needed (pain).    [provider]  omeprazole (PRILOSEC) 20 MG capsule Take 40 mg by mouth daily. 05/19/18   [provider]  oxyCODONE (OXY IR/ROXICODONE) 5 MG immediate release tablet Take 5 mg by mouth every 6 (six) hours as needed for pain. 12/11/18   [provider]  Pancrelipase, Lip-Prot-Amyl, (CREON) 24000-76000 units CPEP Take 24,000-76,000 Units by mouth 3 (three) times daily. Take 48,000 units of lipase by mouth three times a day with a meal. Take an additional one capsule with snacks.    [provider]  potassium chloride SA (K-DUR,KLOR-CON) 20 MEQ tablet Take 60 mEq by mouth 2 (two) times daily. 11/16/18   [provider]  tiotropium (SPIRIVA) 18 MCG inhalation capsule Place 1 capsule into inhaler  and inhale daily. 11/30/18   [provider]  traZODone (DESYREL) 50 MG tablet Take 50 mg by mouth at bedtime as needed for sleep. 11/16/18   [provider]  triamterene-hydrochlorothiazide (MAXZIDE-25) 37.5-25 MG tablet Take 1 tablet by mouth daily.    [provider]  valACYclovir (VALTREX) 500 MG tablet Take 500 mg by mouth daily. 11/17/18   [provider]    Allergies  Allergen Reactions  . Haldol [Haloperidol] Shortness Of Breath    Tachycardia, SHOB.   . Penicillins     Family  History  Problem Relation Age of Onset  . Cancer Father        bladder    Social History Social History   Tobacco Use  . Smoking status: Current Some Day Smoker    Packs/day: 0.25    Types: Cigarettes  . Smokeless tobacco: Never Used  Substance Use Topics  . Alcohol use: Yes    Comment: occasional  . Drug use: No    Review of Systems Constitutional: Negative for fever Cardiovascular: Negative for chest pain. Respiratory: Negative for shortness of breath. Gastrointestinal: Negative for abdominal pain Musculoskeletal: Diffuse musculoskeletal/body pains. Neurological: Negative for headache All other ROS negative  ____________________________________________   PHYSICAL EXAM:  VITAL SIGNS: ED Triage Vitals  Enc Vitals Group     BP --      Pulse Rate 05/13/20 0859 99     Resp 05/13/20 0859 (!) 22     Temp 05/13/20 0859 98.9 F (37.2 C)     Temp Source 05/13/20 0859 Oral     SpO2 05/13/20 0859 100 %     Weight 05/13/20 0900 140 lb (63.5 kg)     Height 05/13/20 0900 5\' 8"  (1.727 m)     Head Circumference --      Peak Flow --      Pain Score 05/13/20 0900 10     Pain Loc --      Pain Edu? --      Excl. in [place]? --     Constitutional: Alert and oriented.  Moderate distress due to pain. Eyes: Normal exam ENT      Head: Normocephalic and atraumatic.      Mouth/Throat: Mucous membranes are moist. Cardiovascular: Normal rate, regular rhythm Respiratory: Normal respiratory effort without tachypnea nor retractions. Breath sounds are clear  Gastrointestinal: Soft and nontender. No distention.   Musculoskeletal: Nontender with normal range of motion in all extremities. Neurologic:  Normal speech and language. No gross focal neurologic deficits  Skin:  Skin is warm, dry and intact.  Psychiatric: Mood and affect are normal.   ____________________________________________    EKG  EKG viewed and interpreted by myself shows normal sinus rhythm at 95 bpm with a narrow  QRS, normal axis, normal intervals, nonspecific but no concerning ST changes.  ____________________________________________    INITIAL IMPRESSION / ASSESSMENT AND PLAN / ED COURSE  Pertinent labs & imaging results that were available during my care of the patient were reviewed by me and considered in my medical decision making (see chart for details).   Patient presents to the emergency department with diffuse body pains.  States his home pain medication is not helping.  Patient also states he was told this week that he will need a blood transfusion on Tuesday which has been set up at [facility].  Patient states he is feeling weak and feels that he may need a blood transfusion sooner.  We will treat the patient  with pain medication, IV hydrate, check labs including CBC and type and screen.  We will likely discuss with [facility] oncology once results are known to discuss disposition and treatment.  I spoke to [facility] oncology regarding the patient as we are unable to see much of the patient's past history past July 2020 for some reason.  They state the patient last hemoglobin was 7.1, currently it is 7.2.  Patient did have a normal white blood cell count on Tuesday however was started on prednisone twice daily.  This could very likely account for the patient's increase in white blood cell count.  Patient describes his typical discomfort throughout his body but states it is just worse.  We will dose a second dose of pain medication in the emergency department.  We will defer blood transfusion until the patient follows up with [facility] on Tuesday.  Patient agreeable to this plan of care.  Alec Hart. was evaluated in Emergency Department on 05/13/2020 for the symptoms described in the history of present illness. He was evaluated in the context of the global COVID-19 pandemic, which necessitated consideration that the patient might be at risk for infection with the SARS-CoV-2 virus that causes COVID-19.  Institutional protocols and algorithms that pertain to the evaluation of patients at risk for COVID-19 are in a state of rapid change based on information released by regulatory bodies including the CDC and federal and state organizations. These policies and algorithms were followed during the patient's care in the ED.  ____________________________________________   FINAL CLINICAL IMPRESSION(S) / ED DIAGNOSES  Pain secondary to oncologic process   Harvest Dark, MD 05/13/20 1102

## 2020-05-13 NOTE — ED Triage Notes (Signed)
Pt to ED via POV c/o generalized body aches and weakness.  Pt has stage 4 bone cancer, is supposed to get a blood transfusion on Tuesday but he has gotten too weak to wait. Pt states that he has taken his pain medication that he has at home and it has not helped ease his pain. Pt denies shortness of breath, does appear to be in pain

## 2020-05-16 ENCOUNTER — Ambulatory Visit: Admit: 2020-05-16 | Discharge: 2020-05-17 | Payer: MEDICAID

## 2020-05-16 ENCOUNTER — Other Ambulatory Visit: Admit: 2020-05-16 | Discharge: 2020-05-17 | Payer: MEDICAID

## 2020-05-16 DIAGNOSIS — C61 Malignant neoplasm of prostate: Principal | ICD-10-CM

## 2020-05-16 DIAGNOSIS — D649 Anemia, unspecified: Principal | ICD-10-CM

## 2020-05-25 ENCOUNTER — Ambulatory Visit: Admit: 2020-05-25 | Discharge: 2020-05-27 | Payer: MEDICAID

## 2020-05-25 ENCOUNTER — Ambulatory Visit: Admit: 2020-05-25 | Discharge: 2020-05-26 | Payer: MEDICAID

## 2020-05-25 DIAGNOSIS — C61 Malignant neoplasm of prostate: Principal | ICD-10-CM

## 2020-06-01 ENCOUNTER — Ambulatory Visit
Admit: 2020-06-01 | Discharge: 2020-06-02 | Payer: MEDICAID | Attending: Hematology & Oncology | Primary: Hematology & Oncology

## 2020-06-01 ENCOUNTER — Other Ambulatory Visit: Admit: 2020-06-01 | Discharge: 2020-06-02 | Payer: MEDICAID

## 2020-06-01 ENCOUNTER — Ambulatory Visit: Admit: 2020-06-01 | Discharge: 2020-06-02 | Payer: MEDICAID

## 2020-06-01 DIAGNOSIS — C7951 Secondary malignant neoplasm of bone: Principal | ICD-10-CM

## 2020-06-01 DIAGNOSIS — C61 Malignant neoplasm of prostate: Principal | ICD-10-CM

## 2020-06-07 DIAGNOSIS — C61 Malignant neoplasm of prostate: Principal | ICD-10-CM

## 2020-06-07 DIAGNOSIS — G893 Neoplasm related pain (acute) (chronic): Principal | ICD-10-CM

## 2020-06-07 DIAGNOSIS — C7951 Secondary malignant neoplasm of bone: Principal | ICD-10-CM

## 2020-06-07 MED ORDER — OXYCODONE 10 MG TABLET
ORAL_TABLET | ORAL | 0 refills | 23.00000 days | Status: CP | PRN
Start: 2020-06-07 — End: 2020-07-07

## 2020-06-12 DIAGNOSIS — F419 Anxiety disorder, unspecified: Principal | ICD-10-CM

## 2020-06-12 MED ORDER — BUSPIRONE 5 MG TABLET
ORAL_TABLET | Freq: Three times a day (TID) | ORAL | 3 refills | 90.00000 days | Status: CP
Start: 2020-06-12 — End: ?

## 2020-06-20 ENCOUNTER — Telehealth: Admit: 2020-06-20 | Discharge: 2020-06-21 | Payer: MEDICAID

## 2020-06-20 MED ORDER — FENTANYL 25 MCG/HR TRANSDERMAL PATCH
MEDICATED_PATCH | TRANSDERMAL | 0 refills | 30 days | Status: CP
Start: 2020-06-20 — End: 2020-07-20

## 2020-06-20 MED ORDER — FENTANYL 100 MCG/HR TRANSDERMAL PATCH
MEDICATED_PATCH | TRANSDERMAL | 0 refills | 30 days | Status: CP
Start: 2020-06-20 — End: 2020-07-20

## 2020-06-20 MED ORDER — OXYCODONE 10 MG TABLET
ORAL_TABLET | ORAL | 0 refills | 23.00000 days | Status: CP | PRN
Start: 2020-06-20 — End: 2020-07-20

## 2020-06-22 ENCOUNTER — Ambulatory Visit: Admit: 2020-06-22 | Discharge: 2020-06-23 | Payer: MEDICAID

## 2020-06-22 ENCOUNTER — Ambulatory Visit: Admit: 2020-06-22 | Discharge: 2020-06-23 | Payer: MEDICAID | Attending: Family | Primary: Family

## 2020-07-06 DIAGNOSIS — G893 Neoplasm related pain (acute) (chronic): Principal | ICD-10-CM

## 2020-07-06 MED ORDER — OXYCODONE 20 MG TABLET
ORAL_TABLET | ORAL | 0 refills | 0.00000 days | Status: CP
Start: 2020-07-06 — End: ?

## 2020-07-11 ENCOUNTER — Other Ambulatory Visit: Admit: 2020-07-11 | Discharge: 2020-07-12 | Payer: MEDICAID

## 2020-07-11 ENCOUNTER — Ambulatory Visit: Admit: 2020-07-11 | Discharge: 2020-07-12 | Payer: MEDICAID

## 2020-07-11 ENCOUNTER — Ambulatory Visit
Admit: 2020-07-11 | Discharge: 2020-07-12 | Payer: MEDICAID | Attending: Hematology & Oncology | Primary: Hematology & Oncology

## 2020-07-11 DIAGNOSIS — C7951 Secondary malignant neoplasm of bone: Principal | ICD-10-CM

## 2020-07-11 DIAGNOSIS — C61 Malignant neoplasm of prostate: Principal | ICD-10-CM

## 2020-07-11 MED ORDER — DICLOFENAC 1 % TOPICAL GEL
Freq: Four times a day (QID) | TOPICAL | 3 refills | 25.00000 days | Status: CP
Start: 2020-07-11 — End: ?
  Filled 2020-07-11: qty 200, 25d supply, fill #0

## 2020-07-11 MED ORDER — TRAZODONE 100 MG TABLET
ORAL_TABLET | Freq: Every evening | ORAL | 3 refills | 90.00000 days | Status: CP | PRN
Start: 2020-07-11 — End: ?
  Filled 2020-07-11: qty 90, 90d supply, fill #0

## 2020-07-11 MED FILL — TRAZODONE 100 MG TABLET: 90 days supply | Qty: 90 | Fill #0 | Status: AC

## 2020-07-11 MED FILL — DICLOFENAC 1 % TOPICAL GEL: 25 days supply | Qty: 200 | Fill #0 | Status: AC

## 2020-07-31 DIAGNOSIS — C61 Malignant neoplasm of prostate: Principal | ICD-10-CM

## 2020-08-01 ENCOUNTER — Ambulatory Visit
Admit: 2020-08-01 | Discharge: 2020-08-02 | Payer: MEDICAID | Attending: Hematology & Oncology | Primary: Hematology & Oncology

## 2020-08-01 ENCOUNTER — Ambulatory Visit: Admit: 2020-08-01 | Discharge: 2020-08-02 | Payer: MEDICAID

## 2020-08-01 ENCOUNTER — Other Ambulatory Visit: Admit: 2020-08-01 | Discharge: 2020-08-02 | Payer: MEDICAID

## 2020-08-01 DIAGNOSIS — C7951 Secondary malignant neoplasm of bone: Principal | ICD-10-CM

## 2020-08-01 DIAGNOSIS — C61 Malignant neoplasm of prostate: Principal | ICD-10-CM

## 2020-08-01 DIAGNOSIS — D649 Anemia, unspecified: Principal | ICD-10-CM

## 2020-08-01 MED ORDER — FENTANYL 25 MCG/HR TRANSDERMAL PATCH
MEDICATED_PATCH | TRANSDERMAL | 0 refills | 21.00000 days | Status: CP
Start: 2020-08-01 — End: 2020-08-22
  Filled 2020-08-01: qty 7, 21d supply, fill #0

## 2020-08-01 MED ORDER — OXYCODONE 20 MG TABLET
ORAL_TABLET | ORAL | 0 refills | 0.00000 days | Status: CP
Start: 2020-08-01 — End: ?
  Filled 2020-08-01: qty 105, 21d supply, fill #0

## 2020-08-01 MED ORDER — FENTANYL 100 MCG/HR TRANSDERMAL PATCH
MEDICATED_PATCH | TRANSDERMAL | 0 refills | 21.00000 days | Status: CP
Start: 2020-08-01 — End: 2020-08-22
  Filled 2020-08-01: qty 7, 21d supply, fill #0

## 2020-08-01 MED FILL — OXYCODONE 20 MG TABLET: 21 days supply | Qty: 105 | Fill #0 | Status: AC

## 2020-08-01 MED FILL — FENTANYL 100 MCG/HR TRANSDERMAL PATCH: 21 days supply | Qty: 7 | Fill #0 | Status: AC

## 2020-08-01 MED FILL — FENTANYL 25 MCG/HR TRANSDERMAL PATCH: 21 days supply | Qty: 7 | Fill #0 | Status: AC

## 2020-08-08 ENCOUNTER — Ambulatory Visit
Admit: 2020-08-08 | Discharge: 2020-08-09 | Payer: MEDICAID | Attending: Hematology & Oncology | Primary: Hematology & Oncology

## 2020-08-08 ENCOUNTER — Other Ambulatory Visit: Admit: 2020-08-08 | Discharge: 2020-08-09 | Payer: MEDICAID

## 2020-08-08 ENCOUNTER — Ambulatory Visit: Admit: 2020-08-08 | Discharge: 2020-08-09 | Payer: MEDICAID

## 2020-08-08 DIAGNOSIS — D649 Anemia, unspecified: Principal | ICD-10-CM

## 2020-08-08 DIAGNOSIS — C61 Malignant neoplasm of prostate: Principal | ICD-10-CM

## 2020-08-15 ENCOUNTER — Ambulatory Visit: Admit: 2020-08-15 | Payer: MEDICAID

## 2020-08-16 DIAGNOSIS — C61 Malignant neoplasm of prostate: Principal | ICD-10-CM

## 2020-08-16 MED ORDER — GABAPENTIN 100 MG CAPSULE
ORAL_CAPSULE | Freq: Every evening | ORAL | 3 refills | 90.00000 days | Status: CP
Start: 2020-08-16 — End: ?

## 2020-08-22 ENCOUNTER — Ambulatory Visit
Admit: 2020-08-22 | Discharge: 2020-08-23 | Payer: MEDICAID | Attending: Hematology & Oncology | Primary: Hematology & Oncology

## 2020-08-22 ENCOUNTER — Ambulatory Visit: Admit: 2020-08-22 | Discharge: 2020-08-23 | Payer: MEDICAID

## 2020-08-22 ENCOUNTER — Other Ambulatory Visit: Admit: 2020-08-22 | Discharge: 2020-08-23 | Payer: MEDICAID

## 2020-08-22 DIAGNOSIS — C61 Malignant neoplasm of prostate: Principal | ICD-10-CM

## 2020-08-22 DIAGNOSIS — C7951 Secondary malignant neoplasm of bone: Principal | ICD-10-CM

## 2020-08-22 DIAGNOSIS — G893 Neoplasm related pain (acute) (chronic): Principal | ICD-10-CM

## 2020-08-22 DIAGNOSIS — D649 Anemia, unspecified: Principal | ICD-10-CM

## 2020-08-22 MED ORDER — FENTANYL 25 MCG/HR TRANSDERMAL PATCH
MEDICATED_PATCH | TRANSDERMAL | 0 refills | 21 days | Status: CP
Start: 2020-08-22 — End: 2020-09-12
  Filled 2020-08-22: qty 7, 21d supply, fill #0

## 2020-08-22 MED ORDER — OXYCODONE 20 MG TABLET
ORAL_TABLET | 0 refills | 0 days | Status: CP
Start: 2020-08-22 — End: ?
  Filled 2020-08-22: qty 105, 21d supply, fill #0

## 2020-08-22 MED ORDER — FENTANYL 100 MCG/HR TRANSDERMAL PATCH
MEDICATED_PATCH | TRANSDERMAL | 0 refills | 21.00000 days | Status: CP
Start: 2020-08-22 — End: 2020-09-12
  Filled 2020-08-22: qty 7, 21d supply, fill #0

## 2020-08-22 MED FILL — FENTANYL 25 MCG/HR TRANSDERMAL PATCH: 21 days supply | Qty: 7 | Fill #0 | Status: AC

## 2020-08-22 MED FILL — FENTANYL 100 MCG/HR TRANSDERMAL PATCH: 21 days supply | Qty: 7 | Fill #0 | Status: AC

## 2020-08-22 MED FILL — OXYCODONE 20 MG TABLET: 21 days supply | Qty: 105 | Fill #0 | Status: AC

## 2020-09-05 DIAGNOSIS — C61 Malignant neoplasm of prostate: Principal | ICD-10-CM

## 2020-09-07 ENCOUNTER — Ambulatory Visit: Admit: 2020-09-07 | Discharge: 2020-09-20 | Payer: MEDICAID

## 2020-09-07 ENCOUNTER — Ambulatory Visit: Admit: 2020-09-07 | Discharge: 2020-09-08 | Payer: MEDICAID

## 2020-09-12 ENCOUNTER — Ambulatory Visit
Admit: 2020-09-12 | Discharge: 2020-09-12 | Payer: MEDICAID | Attending: Hematology & Oncology | Primary: Hematology & Oncology

## 2020-09-12 ENCOUNTER — Ambulatory Visit: Admit: 2020-09-12 | Discharge: 2020-09-12 | Payer: MEDICAID

## 2020-09-12 ENCOUNTER — Other Ambulatory Visit: Admit: 2020-09-12 | Discharge: 2020-09-12 | Payer: MEDICAID

## 2020-09-12 DIAGNOSIS — D649 Anemia, unspecified: Principal | ICD-10-CM

## 2020-09-12 DIAGNOSIS — C61 Malignant neoplasm of prostate: Principal | ICD-10-CM

## 2020-09-12 MED ORDER — FENTANYL 100 MCG/HR TRANSDERMAL PATCH
MEDICATED_PATCH | TRANSDERMAL | 0 refills | 9.00000 days | Status: CP
Start: 2020-09-12 — End: 2020-09-21

## 2020-09-12 MED ORDER — OXYCODONE 20 MG TABLET
ORAL_TABLET | ORAL | 0 refills | 0.00000 days | Status: CP
Start: 2020-09-12 — End: ?
  Filled 2020-09-12: qty 35, 7d supply, fill #0

## 2020-09-12 MED ORDER — FENTANYL 25 MCG/HR TRANSDERMAL PATCH
MEDICATED_PATCH | TRANSDERMAL | 0 refills | 9.00000 days | Status: CP
Start: 2020-09-12 — End: 2020-09-21
  Filled 2020-09-12 (×2): qty 3, 9d supply, fill #0

## 2020-09-12 MED FILL — FENTANYL 100 MCG/HR TRANSDERMAL PATCH: 9 days supply | Qty: 3 | Fill #0 | Status: AC

## 2020-09-12 MED FILL — FENTANYL 25 MCG/HR TRANSDERMAL PATCH: 9 days supply | Qty: 3 | Fill #0 | Status: AC

## 2020-09-12 MED FILL — OXYCODONE 20 MG TABLET: 7 days supply | Qty: 35 | Fill #0 | Status: AC

## 2020-09-16 ENCOUNTER — Encounter: Admit: 2020-09-16 | Discharge: 2020-09-21 | Payer: MEDICAID

## 2020-09-16 ENCOUNTER — Inpatient Hospital Stay: Admit: 2020-09-16 | Discharge: 2020-09-21 | Payer: MEDICAID

## 2020-09-16 MED ORDER — SALMETEROL 50 MCG/DOSE BLISTER POWDER FOR INHALATION
Freq: Two times a day (BID) | RESPIRATORY_TRACT | 0 days
Start: 2020-09-16 — End: ?

## 2020-09-16 MED ORDER — TRAZODONE 100 MG TABLET
Freq: Every evening | ORAL | 0.00000 days
Start: 2020-09-16 — End: ?

## 2020-09-16 MED ORDER — HEPARIN FLUSH (PORCINE) 100 UNIT/ML IN 0.9 % SODIUM CHLORIDE IV KIT
INTRAVENOUS | 0.00000 days
Start: 2020-09-16 — End: ?

## 2020-09-16 MED ORDER — PREDNISOLONE 5 MG TABLET
Freq: Two times a day (BID) | ORAL | 0 days
Start: 2020-09-16 — End: ?

## 2020-09-16 MED ORDER — ALBUTEROL SULFATE 2.5 MG/3 ML (0.083 %) SOLUTION FOR NEBULIZATION
Freq: Four times a day (QID) | RESPIRATORY_TRACT | 0.00000 days | PRN
Start: 2020-09-16 — End: ?

## 2020-09-16 MED ORDER — CREON 24,000-76,000-120,000 UNIT CAPSULE,DELAYED RELEASE
Freq: Three times a day (TID) | ORAL | 0 days
Start: 2020-09-16 — End: ?

## 2020-09-16 MED ORDER — FENTANYL 100 MCG/HR TRANSDERMAL PATCH
TRANSDERMAL | 0 days
Start: 2020-09-16 — End: ?

## 2020-09-16 MED ORDER — MAGIC BUTT PASTE
Freq: Three times a day (TID) | TOPICAL | 0 days | PRN
Start: 2020-09-16 — End: ?

## 2020-09-16 MED ORDER — OXYCODONE 20 MG TABLET
ORAL | 0.00000 days | PRN
Start: 2020-09-16 — End: ?

## 2020-09-16 MED ORDER — BUSPIRONE 5 MG TABLET
Freq: Three times a day (TID) | ORAL | 0 days
Start: 2020-09-16 — End: ?

## 2020-09-16 MED ORDER — AMLODIPINE 10 MG TABLET
Freq: Every day | ORAL | 0 days
Start: 2020-09-16 — End: ?

## 2020-09-16 MED ORDER — ALBUTEROL SULF 90 MCG/ACTUATION BREATH ACTIVATED POWDER INHALER,SENSOR
Freq: Four times a day (QID) | RESPIRATORY_TRACT | 0 days | PRN
Start: 2020-09-16 — End: ?

## 2020-09-16 MED ORDER — ONDANSETRON HCL 8 MG TABLET
Freq: Three times a day (TID) | ORAL | 0 days | PRN
Start: 2020-09-16 — End: ?

## 2020-09-16 MED ORDER — OMEPRAZOLE MAGNESIUM 20 MG TABLET,DELAYED RELEASE
Freq: Every day | ORAL | 0.00000 days
Start: 2020-09-16 — End: ?

## 2020-09-16 MED ORDER — SPIRIVA WITH HANDIHALER 18 MCG AND INHALATION CAPSULES
Freq: Every day | RESPIRATORY_TRACT | 0.00000 days
Start: 2020-09-16 — End: ?

## 2020-09-16 MED ORDER — DIPHENHYDRAMINE 25 MG TABLET
Freq: Four times a day (QID) | ORAL | 0 days | PRN
Start: 2020-09-16 — End: ?

## 2020-09-16 MED ORDER — SODIUM CHLORIDE 0.9 % (FLUSH) INJECTION SYRINGE WRAPPER
INTRAVENOUS | 0.00000 days
Start: 2020-09-16 — End: ?

## 2020-09-16 MED ORDER — SENNOSIDES 8.6 MG TABLET
Freq: Two times a day (BID) | ORAL | 0 days
Start: 2020-09-16 — End: ?

## 2020-09-16 MED ORDER — (~~LOC~~ PAP ONLY) NALOXONE 2 MG/2 ML NASAL SPRAY KIT
NASAL | 0 days | PRN
Start: 2020-09-16 — End: ?

## 2020-09-16 MED ORDER — CHLORHEXIDINE GLUCONATE 0.12 % MOUTHWASH
Freq: Two times a day (BID) | OROMUCOSAL | 0.00000 days
Start: 2020-09-16 — End: ?

## 2020-09-16 MED ORDER — FENTANYL 25 MCG/HR TRANSDERMAL PATCH
TRANSDERMAL | 0.00000 days
Start: 2020-09-16 — End: ?

## 2020-09-16 MED ORDER — POLYETHYLENE GLYCOL 3350 17 GRAM/DOSE ORAL POWDER
Freq: Every day | ORAL | 0 days
Start: 2020-09-16 — End: ?

## 2020-09-16 MED ORDER — GABAPENTIN 100 MG CAPSULE
Freq: Every evening | ORAL | 0 days
Start: 2020-09-16 — End: ?

## 2020-09-17 DIAGNOSIS — G893 Neoplasm related pain (acute) (chronic): Principal | ICD-10-CM

## 2020-09-17 MED ORDER — OXYCODONE 20 MG TABLET
ORAL_TABLET | 0 refills | 0 days | Status: CP
Start: 2020-09-17 — End: ?

## 2020-09-19 MED ORDER — ACETAMINOPHEN 650 MG RECTAL SUPPOSITORY
Freq: Four times a day (QID) | RECTAL | 0 refills | 1.00000 days | PRN
Start: 2020-09-19 — End: ?

## 2020-09-19 MED ORDER — BISACODYL 10 MG RECTAL SUPPOSITORY
Freq: Every day | RECTAL | 0 refills | 2.00000 days | PRN
Start: 2020-09-19 — End: ?

## 2020-09-19 MED ORDER — MORPHINE CONCENTRATE 100 MG/5 ML (20 MG/ML) ORAL SOLUTION
ORAL | 0 refills | 8 days | PRN
Start: 2020-09-19 — End: ?

## 2020-09-19 MED ORDER — HYOSCYAMINE 0.125 MG SUBLINGUAL TABLET
ORAL_TABLET | SUBLINGUAL | 0 refills | 2 days | PRN
Start: 2020-09-19 — End: ?

## 2020-09-19 MED ORDER — PROCHLORPERAZINE MALEATE 10 MG TABLET
ORAL_TABLET | Freq: Four times a day (QID) | ORAL | 0 refills | 2.00000 days | PRN
Start: 2020-09-19 — End: ?

## 2020-09-19 MED ORDER — LORAZEPAM 0.5 MG TABLET
ORAL_TABLET | Freq: Four times a day (QID) | ORAL | 0 refills | 3.00000 days | PRN
Start: 2020-09-19 — End: ?

## 2020-09-19 MED ORDER — HALOPERIDOL LACTATE 2 MG/ML ORAL CONCENTRATE
Freq: Four times a day (QID) | ORAL | 0 refills | 8 days | PRN
Start: 2020-09-19 — End: ?

## 2020-09-22 ENCOUNTER — Encounter: Admit: 2020-09-22 | Discharge: 2020-10-22 | Payer: MEDICAID

## 2020-09-27 DIAGNOSIS — G893 Neoplasm related pain (acute) (chronic): Principal | ICD-10-CM

## 2020-09-27 DIAGNOSIS — C7951 Secondary malignant neoplasm of bone: Principal | ICD-10-CM

## 2020-09-27 DIAGNOSIS — K860 Alcohol-induced chronic pancreatitis: Principal | ICD-10-CM

## 2020-09-27 MED ORDER — FENTANYL 100 MCG/HR TRANSDERMAL PATCH: 1 | patch | 0 refills | 45 days | Status: AC

## 2020-09-27 MED ORDER — FENTANYL 100 MCG/HR TRANSDERMAL PATCH
MEDICATED_PATCH | TRANSDERMAL | 0 refills | 45.00000 days | Status: CP
Start: 2020-09-27 — End: 2020-09-27

## 2020-09-27 MED ORDER — FENTANYL 25 MCG/HR TRANSDERMAL PATCH: 1 | patch | 0 refills | 45 days | Status: AC

## 2020-09-27 MED ORDER — FENTANYL 25 MCG/HR TRANSDERMAL PATCH
MEDICATED_PATCH | TRANSDERMAL | 0 refills | 45.00000 days | Status: CP
Start: 2020-09-27 — End: 2020-10-06

## 2020-10-23 ENCOUNTER — Encounter: Admit: 2020-10-23 | Discharge: 2020-11-21 | Payer: MEDICAID

## 2020-10-24 DIAGNOSIS — C7951 Secondary malignant neoplasm of bone: Principal | ICD-10-CM

## 2020-10-24 DIAGNOSIS — Z452 Encounter for adjustment and management of vascular access device: Principal | ICD-10-CM

## 2020-10-24 DIAGNOSIS — J449 Chronic obstructive pulmonary disease, unspecified: Principal | ICD-10-CM

## 2020-10-24 DIAGNOSIS — D6481 Anemia due to antineoplastic chemotherapy: Principal | ICD-10-CM

## 2020-10-24 DIAGNOSIS — K219 Gastro-esophageal reflux disease without esophagitis: Principal | ICD-10-CM

## 2020-10-24 DIAGNOSIS — I10 Essential (primary) hypertension: Principal | ICD-10-CM

## 2020-10-24 DIAGNOSIS — G479 Sleep disorder, unspecified: Principal | ICD-10-CM

## 2020-10-24 DIAGNOSIS — T451X5D Adverse effect of antineoplastic and immunosuppressive drugs, subsequent encounter: Principal | ICD-10-CM

## 2020-10-24 DIAGNOSIS — G8929 Other chronic pain: Principal | ICD-10-CM

## 2020-10-24 DIAGNOSIS — K860 Alcohol-induced chronic pancreatitis: Principal | ICD-10-CM

## 2020-10-24 DIAGNOSIS — F10180 Alcohol abuse with alcohol-induced anxiety disorder: Principal | ICD-10-CM

## 2020-10-24 DIAGNOSIS — M546 Pain in thoracic spine: Principal | ICD-10-CM

## 2020-10-24 DIAGNOSIS — D630 Anemia in neoplastic disease: Principal | ICD-10-CM

## 2020-10-24 DIAGNOSIS — R634 Abnormal weight loss: Principal | ICD-10-CM

## 2020-10-24 DIAGNOSIS — Z7952 Long term (current) use of systemic steroids: Principal | ICD-10-CM

## 2020-10-24 DIAGNOSIS — C61 Malignant neoplasm of prostate: Principal | ICD-10-CM

## 2020-10-24 DIAGNOSIS — F17210 Nicotine dependence, cigarettes, uncomplicated: Principal | ICD-10-CM

## 2020-10-24 DIAGNOSIS — R1310 Dysphagia, unspecified: Principal | ICD-10-CM

## 2020-10-24 DIAGNOSIS — Z6820 Body mass index (BMI) 20.0-20.9, adult: Principal | ICD-10-CM

## 2020-10-24 DIAGNOSIS — K1232 Oral mucositis (ulcerative) due to other drugs: Principal | ICD-10-CM

## 2020-10-24 DIAGNOSIS — G893 Neoplasm related pain (acute) (chronic): Principal | ICD-10-CM

## 2020-10-24 DIAGNOSIS — F329 Major depressive disorder, single episode, unspecified: Principal | ICD-10-CM

## 2020-10-24 MED ORDER — OXYCODONE 20 MG TABLET
ORAL_TABLET | ORAL | 0 refills | 4.00000 days | Status: CP | PRN
Start: 2020-10-24 — End: 2020-11-03

## 2020-10-25 DIAGNOSIS — D649 Anemia, unspecified: Principal | ICD-10-CM

## 2020-10-25 DIAGNOSIS — C61 Malignant neoplasm of prostate: Principal | ICD-10-CM

## 2020-10-25 DIAGNOSIS — C7951 Secondary malignant neoplasm of bone: Principal | ICD-10-CM

## 2020-11-01 DIAGNOSIS — K1232 Oral mucositis (ulcerative) due to other drugs: Principal | ICD-10-CM

## 2020-11-01 DIAGNOSIS — K219 Gastro-esophageal reflux disease without esophagitis: Principal | ICD-10-CM

## 2020-11-01 DIAGNOSIS — C7951 Secondary malignant neoplasm of bone: Principal | ICD-10-CM

## 2020-11-01 DIAGNOSIS — F10180 Alcohol abuse with alcohol-induced anxiety disorder: Principal | ICD-10-CM

## 2020-11-01 DIAGNOSIS — F329 Major depressive disorder, single episode, unspecified: Principal | ICD-10-CM

## 2020-11-01 DIAGNOSIS — T451X5D Adverse effect of antineoplastic and immunosuppressive drugs, subsequent encounter: Principal | ICD-10-CM

## 2020-11-01 DIAGNOSIS — I10 Essential (primary) hypertension: Principal | ICD-10-CM

## 2020-11-01 DIAGNOSIS — C61 Malignant neoplasm of prostate: Principal | ICD-10-CM

## 2020-11-01 DIAGNOSIS — M546 Pain in thoracic spine: Principal | ICD-10-CM

## 2020-11-01 DIAGNOSIS — G8929 Other chronic pain: Principal | ICD-10-CM

## 2020-11-01 DIAGNOSIS — Z6820 Body mass index (BMI) 20.0-20.9, adult: Principal | ICD-10-CM

## 2020-11-01 DIAGNOSIS — Z7952 Long term (current) use of systemic steroids: Principal | ICD-10-CM

## 2020-11-01 DIAGNOSIS — J449 Chronic obstructive pulmonary disease, unspecified: Principal | ICD-10-CM

## 2020-11-01 DIAGNOSIS — K860 Alcohol-induced chronic pancreatitis: Principal | ICD-10-CM

## 2020-11-01 DIAGNOSIS — F17210 Nicotine dependence, cigarettes, uncomplicated: Principal | ICD-10-CM

## 2020-11-01 DIAGNOSIS — G479 Sleep disorder, unspecified: Principal | ICD-10-CM

## 2020-11-01 DIAGNOSIS — D630 Anemia in neoplastic disease: Principal | ICD-10-CM

## 2020-11-01 DIAGNOSIS — R634 Abnormal weight loss: Principal | ICD-10-CM

## 2020-11-01 DIAGNOSIS — D6481 Anemia due to antineoplastic chemotherapy: Principal | ICD-10-CM

## 2020-11-01 DIAGNOSIS — R1310 Dysphagia, unspecified: Principal | ICD-10-CM

## 2020-11-01 DIAGNOSIS — Z452 Encounter for adjustment and management of vascular access device: Principal | ICD-10-CM

## 2020-11-01 MED ORDER — AZITHROMYCIN 250 MG TABLET
Freq: Every day | ORAL | 0 days
Start: 2020-11-01 — End: 2020-11-07

## 2020-11-03 DIAGNOSIS — C61 Malignant neoplasm of prostate: Principal | ICD-10-CM

## 2020-11-03 MED ORDER — OXYCODONE 20 MG TABLET
ORAL_TABLET | ORAL | 0 refills | 4.00000 days | Status: CP | PRN
Start: 2020-11-03 — End: ?

## 2020-11-10 DIAGNOSIS — R1310 Dysphagia, unspecified: Principal | ICD-10-CM

## 2020-11-10 DIAGNOSIS — F10180 Alcohol abuse with alcohol-induced anxiety disorder: Principal | ICD-10-CM

## 2020-11-10 DIAGNOSIS — J449 Chronic obstructive pulmonary disease, unspecified: Principal | ICD-10-CM

## 2020-11-10 DIAGNOSIS — G479 Sleep disorder, unspecified: Principal | ICD-10-CM

## 2020-11-10 DIAGNOSIS — C7951 Secondary malignant neoplasm of bone: Principal | ICD-10-CM

## 2020-11-10 DIAGNOSIS — K1232 Oral mucositis (ulcerative) due to other drugs: Principal | ICD-10-CM

## 2020-11-10 DIAGNOSIS — F17210 Nicotine dependence, cigarettes, uncomplicated: Principal | ICD-10-CM

## 2020-11-10 DIAGNOSIS — K860 Alcohol-induced chronic pancreatitis: Principal | ICD-10-CM

## 2020-11-10 DIAGNOSIS — M546 Pain in thoracic spine: Principal | ICD-10-CM

## 2020-11-10 DIAGNOSIS — Z6820 Body mass index (BMI) 20.0-20.9, adult: Principal | ICD-10-CM

## 2020-11-10 DIAGNOSIS — I10 Essential (primary) hypertension: Principal | ICD-10-CM

## 2020-11-10 DIAGNOSIS — T451X5D Adverse effect of antineoplastic and immunosuppressive drugs, subsequent encounter: Principal | ICD-10-CM

## 2020-11-10 DIAGNOSIS — Z7952 Long term (current) use of systemic steroids: Principal | ICD-10-CM

## 2020-11-10 DIAGNOSIS — Z452 Encounter for adjustment and management of vascular access device: Principal | ICD-10-CM

## 2020-11-10 DIAGNOSIS — G8929 Other chronic pain: Principal | ICD-10-CM

## 2020-11-10 DIAGNOSIS — R634 Abnormal weight loss: Principal | ICD-10-CM

## 2020-11-10 DIAGNOSIS — C61 Malignant neoplasm of prostate: Principal | ICD-10-CM

## 2020-11-10 DIAGNOSIS — D6481 Anemia due to antineoplastic chemotherapy: Principal | ICD-10-CM

## 2020-11-10 DIAGNOSIS — K219 Gastro-esophageal reflux disease without esophagitis: Principal | ICD-10-CM

## 2020-11-10 DIAGNOSIS — F329 Major depressive disorder, single episode, unspecified: Principal | ICD-10-CM

## 2020-11-10 DIAGNOSIS — D630 Anemia in neoplastic disease: Principal | ICD-10-CM

## 2020-11-14 DIAGNOSIS — C61 Malignant neoplasm of prostate: Principal | ICD-10-CM

## 2020-11-14 DIAGNOSIS — D649 Anemia, unspecified: Principal | ICD-10-CM

## 2020-11-15 DIAGNOSIS — K1232 Oral mucositis (ulcerative) due to other drugs: Principal | ICD-10-CM

## 2020-11-15 DIAGNOSIS — G479 Sleep disorder, unspecified: Principal | ICD-10-CM

## 2020-11-15 DIAGNOSIS — K860 Alcohol-induced chronic pancreatitis: Principal | ICD-10-CM

## 2020-11-15 DIAGNOSIS — Z6820 Body mass index (BMI) 20.0-20.9, adult: Principal | ICD-10-CM

## 2020-11-15 DIAGNOSIS — Z452 Encounter for adjustment and management of vascular access device: Principal | ICD-10-CM

## 2020-11-15 DIAGNOSIS — C61 Malignant neoplasm of prostate: Principal | ICD-10-CM

## 2020-11-15 DIAGNOSIS — R634 Abnormal weight loss: Principal | ICD-10-CM

## 2020-11-15 DIAGNOSIS — G8929 Other chronic pain: Principal | ICD-10-CM

## 2020-11-15 DIAGNOSIS — F329 Major depressive disorder, single episode, unspecified: Principal | ICD-10-CM

## 2020-11-15 DIAGNOSIS — T451X5D Adverse effect of antineoplastic and immunosuppressive drugs, subsequent encounter: Principal | ICD-10-CM

## 2020-11-15 DIAGNOSIS — C7951 Secondary malignant neoplasm of bone: Principal | ICD-10-CM

## 2020-11-15 DIAGNOSIS — D6481 Anemia due to antineoplastic chemotherapy: Principal | ICD-10-CM

## 2020-11-15 DIAGNOSIS — K219 Gastro-esophageal reflux disease without esophagitis: Principal | ICD-10-CM

## 2020-11-15 DIAGNOSIS — Z7952 Long term (current) use of systemic steroids: Principal | ICD-10-CM

## 2020-11-15 DIAGNOSIS — D630 Anemia in neoplastic disease: Principal | ICD-10-CM

## 2020-11-15 DIAGNOSIS — F17210 Nicotine dependence, cigarettes, uncomplicated: Principal | ICD-10-CM

## 2020-11-15 DIAGNOSIS — I10 Essential (primary) hypertension: Principal | ICD-10-CM

## 2020-11-15 DIAGNOSIS — F10180 Alcohol abuse with alcohol-induced anxiety disorder: Principal | ICD-10-CM

## 2020-11-15 DIAGNOSIS — R1310 Dysphagia, unspecified: Principal | ICD-10-CM

## 2020-11-15 DIAGNOSIS — M546 Pain in thoracic spine: Principal | ICD-10-CM

## 2020-11-15 DIAGNOSIS — J449 Chronic obstructive pulmonary disease, unspecified: Principal | ICD-10-CM

## 2020-11-21 DIAGNOSIS — F329 Major depressive disorder, single episode, unspecified: Principal | ICD-10-CM

## 2020-11-21 DIAGNOSIS — F17210 Nicotine dependence, cigarettes, uncomplicated: Principal | ICD-10-CM

## 2020-11-21 DIAGNOSIS — C7951 Secondary malignant neoplasm of bone: Principal | ICD-10-CM

## 2020-11-21 DIAGNOSIS — R1310 Dysphagia, unspecified: Principal | ICD-10-CM

## 2020-11-21 DIAGNOSIS — G479 Sleep disorder, unspecified: Principal | ICD-10-CM

## 2020-11-21 DIAGNOSIS — Z452 Encounter for adjustment and management of vascular access device: Principal | ICD-10-CM

## 2020-11-21 DIAGNOSIS — G8929 Other chronic pain: Principal | ICD-10-CM

## 2020-11-21 DIAGNOSIS — R634 Abnormal weight loss: Principal | ICD-10-CM

## 2020-11-21 DIAGNOSIS — K860 Alcohol-induced chronic pancreatitis: Principal | ICD-10-CM

## 2020-11-21 DIAGNOSIS — Z7952 Long term (current) use of systemic steroids: Principal | ICD-10-CM

## 2020-11-21 DIAGNOSIS — K219 Gastro-esophageal reflux disease without esophagitis: Principal | ICD-10-CM

## 2020-11-21 DIAGNOSIS — I10 Essential (primary) hypertension: Principal | ICD-10-CM

## 2020-11-21 DIAGNOSIS — K1232 Oral mucositis (ulcerative) due to other drugs: Principal | ICD-10-CM

## 2020-11-21 DIAGNOSIS — J449 Chronic obstructive pulmonary disease, unspecified: Principal | ICD-10-CM

## 2020-11-21 DIAGNOSIS — C61 Malignant neoplasm of prostate: Principal | ICD-10-CM

## 2020-11-21 DIAGNOSIS — F10180 Alcohol abuse with alcohol-induced anxiety disorder: Principal | ICD-10-CM

## 2020-11-21 DIAGNOSIS — Z6820 Body mass index (BMI) 20.0-20.9, adult: Principal | ICD-10-CM

## 2020-11-21 DIAGNOSIS — M546 Pain in thoracic spine: Principal | ICD-10-CM

## 2020-11-21 DIAGNOSIS — D6481 Anemia due to antineoplastic chemotherapy: Principal | ICD-10-CM

## 2020-11-21 DIAGNOSIS — D630 Anemia in neoplastic disease: Principal | ICD-10-CM

## 2020-11-21 DIAGNOSIS — T451X5D Adverse effect of antineoplastic and immunosuppressive drugs, subsequent encounter: Principal | ICD-10-CM

## 2020-11-22 ENCOUNTER — Encounter: Admit: 2020-11-22 | Discharge: 2020-12-06 | Payer: MEDICAID

## 2020-11-22 DIAGNOSIS — D649 Anemia, unspecified: Principal | ICD-10-CM

## 2020-11-22 DIAGNOSIS — C61 Malignant neoplasm of prostate: Principal | ICD-10-CM

## 2020-12-01 DIAGNOSIS — D630 Anemia in neoplastic disease: Principal | ICD-10-CM

## 2020-12-01 DIAGNOSIS — C7951 Secondary malignant neoplasm of bone: Principal | ICD-10-CM

## 2020-12-01 DIAGNOSIS — F329 Major depressive disorder, single episode, unspecified: Principal | ICD-10-CM

## 2020-12-01 DIAGNOSIS — R1310 Dysphagia, unspecified: Principal | ICD-10-CM

## 2020-12-01 DIAGNOSIS — G8929 Other chronic pain: Principal | ICD-10-CM

## 2020-12-01 DIAGNOSIS — K860 Alcohol-induced chronic pancreatitis: Principal | ICD-10-CM

## 2020-12-01 DIAGNOSIS — C61 Malignant neoplasm of prostate: Principal | ICD-10-CM

## 2020-12-01 DIAGNOSIS — G479 Sleep disorder, unspecified: Principal | ICD-10-CM

## 2020-12-01 DIAGNOSIS — R634 Abnormal weight loss: Principal | ICD-10-CM

## 2020-12-01 DIAGNOSIS — T451X5D Adverse effect of antineoplastic and immunosuppressive drugs, subsequent encounter: Principal | ICD-10-CM

## 2020-12-01 DIAGNOSIS — Z7952 Long term (current) use of systemic steroids: Principal | ICD-10-CM

## 2020-12-01 DIAGNOSIS — K1232 Oral mucositis (ulcerative) due to other drugs: Principal | ICD-10-CM

## 2020-12-01 DIAGNOSIS — F17210 Nicotine dependence, cigarettes, uncomplicated: Principal | ICD-10-CM

## 2020-12-01 DIAGNOSIS — Z452 Encounter for adjustment and management of vascular access device: Principal | ICD-10-CM

## 2020-12-01 DIAGNOSIS — M546 Pain in thoracic spine: Principal | ICD-10-CM

## 2020-12-01 DIAGNOSIS — Z6820 Body mass index (BMI) 20.0-20.9, adult: Principal | ICD-10-CM

## 2020-12-01 DIAGNOSIS — J449 Chronic obstructive pulmonary disease, unspecified: Principal | ICD-10-CM

## 2020-12-01 DIAGNOSIS — K219 Gastro-esophageal reflux disease without esophagitis: Principal | ICD-10-CM

## 2020-12-01 DIAGNOSIS — I10 Essential (primary) hypertension: Principal | ICD-10-CM

## 2020-12-01 DIAGNOSIS — D6481 Anemia due to antineoplastic chemotherapy: Principal | ICD-10-CM

## 2020-12-01 DIAGNOSIS — F10180 Alcohol abuse with alcohol-induced anxiety disorder: Principal | ICD-10-CM

## 2020-12-05 DIAGNOSIS — G479 Sleep disorder, unspecified: Principal | ICD-10-CM

## 2020-12-05 DIAGNOSIS — Z452 Encounter for adjustment and management of vascular access device: Principal | ICD-10-CM

## 2020-12-05 DIAGNOSIS — F17210 Nicotine dependence, cigarettes, uncomplicated: Principal | ICD-10-CM

## 2020-12-05 DIAGNOSIS — R1310 Dysphagia, unspecified: Principal | ICD-10-CM

## 2020-12-05 DIAGNOSIS — F10180 Alcohol abuse with alcohol-induced anxiety disorder: Principal | ICD-10-CM

## 2020-12-05 DIAGNOSIS — K860 Alcohol-induced chronic pancreatitis: Principal | ICD-10-CM

## 2020-12-05 DIAGNOSIS — Z6820 Body mass index (BMI) 20.0-20.9, adult: Principal | ICD-10-CM

## 2020-12-05 DIAGNOSIS — K219 Gastro-esophageal reflux disease without esophagitis: Principal | ICD-10-CM

## 2020-12-05 DIAGNOSIS — F329 Major depressive disorder, single episode, unspecified: Principal | ICD-10-CM

## 2020-12-05 DIAGNOSIS — C7951 Secondary malignant neoplasm of bone: Principal | ICD-10-CM

## 2020-12-05 DIAGNOSIS — M546 Pain in thoracic spine: Principal | ICD-10-CM

## 2020-12-05 DIAGNOSIS — K1232 Oral mucositis (ulcerative) due to other drugs: Principal | ICD-10-CM

## 2020-12-05 DIAGNOSIS — T451X5D Adverse effect of antineoplastic and immunosuppressive drugs, subsequent encounter: Principal | ICD-10-CM

## 2020-12-05 DIAGNOSIS — D630 Anemia in neoplastic disease: Principal | ICD-10-CM

## 2020-12-05 DIAGNOSIS — I10 Essential (primary) hypertension: Principal | ICD-10-CM

## 2020-12-05 DIAGNOSIS — G8929 Other chronic pain: Principal | ICD-10-CM

## 2020-12-05 DIAGNOSIS — R634 Abnormal weight loss: Principal | ICD-10-CM

## 2020-12-05 DIAGNOSIS — J449 Chronic obstructive pulmonary disease, unspecified: Principal | ICD-10-CM

## 2020-12-05 DIAGNOSIS — C61 Malignant neoplasm of prostate: Principal | ICD-10-CM

## 2020-12-05 DIAGNOSIS — Z7952 Long term (current) use of systemic steroids: Principal | ICD-10-CM

## 2020-12-05 DIAGNOSIS — D6481 Anemia due to antineoplastic chemotherapy: Principal | ICD-10-CM

## 2020-12-05 DIAGNOSIS — D649 Anemia, unspecified: Principal | ICD-10-CM

## 2020-12-06 DIAGNOSIS — R634 Abnormal weight loss: Principal | ICD-10-CM

## 2020-12-06 DIAGNOSIS — K1232 Oral mucositis (ulcerative) due to other drugs: Principal | ICD-10-CM

## 2020-12-06 DIAGNOSIS — M546 Pain in thoracic spine: Principal | ICD-10-CM

## 2020-12-06 DIAGNOSIS — C7951 Secondary malignant neoplasm of bone: Principal | ICD-10-CM

## 2020-12-06 DIAGNOSIS — D630 Anemia in neoplastic disease: Principal | ICD-10-CM

## 2020-12-06 DIAGNOSIS — K219 Gastro-esophageal reflux disease without esophagitis: Principal | ICD-10-CM

## 2020-12-06 DIAGNOSIS — Z452 Encounter for adjustment and management of vascular access device: Principal | ICD-10-CM

## 2020-12-06 DIAGNOSIS — C61 Malignant neoplasm of prostate: Principal | ICD-10-CM

## 2020-12-06 DIAGNOSIS — G479 Sleep disorder, unspecified: Principal | ICD-10-CM

## 2020-12-06 DIAGNOSIS — F329 Major depressive disorder, single episode, unspecified: Principal | ICD-10-CM

## 2020-12-06 DIAGNOSIS — K860 Alcohol-induced chronic pancreatitis: Principal | ICD-10-CM

## 2020-12-06 DIAGNOSIS — Z7952 Long term (current) use of systemic steroids: Principal | ICD-10-CM

## 2020-12-06 DIAGNOSIS — T451X5D Adverse effect of antineoplastic and immunosuppressive drugs, subsequent encounter: Principal | ICD-10-CM

## 2020-12-06 DIAGNOSIS — J449 Chronic obstructive pulmonary disease, unspecified: Principal | ICD-10-CM

## 2020-12-06 DIAGNOSIS — F10180 Alcohol abuse with alcohol-induced anxiety disorder: Principal | ICD-10-CM

## 2020-12-06 DIAGNOSIS — Z6820 Body mass index (BMI) 20.0-20.9, adult: Principal | ICD-10-CM

## 2020-12-06 DIAGNOSIS — R1310 Dysphagia, unspecified: Principal | ICD-10-CM

## 2020-12-06 DIAGNOSIS — G8929 Other chronic pain: Principal | ICD-10-CM

## 2020-12-06 DIAGNOSIS — I10 Essential (primary) hypertension: Principal | ICD-10-CM

## 2020-12-06 DIAGNOSIS — F17210 Nicotine dependence, cigarettes, uncomplicated: Principal | ICD-10-CM

## 2020-12-06 DIAGNOSIS — D6481 Anemia due to antineoplastic chemotherapy: Principal | ICD-10-CM

## 2020-12-08 DIAGNOSIS — Z7952 Long term (current) use of systemic steroids: Principal | ICD-10-CM

## 2020-12-08 DIAGNOSIS — J449 Chronic obstructive pulmonary disease, unspecified: Principal | ICD-10-CM

## 2020-12-08 DIAGNOSIS — K219 Gastro-esophageal reflux disease without esophagitis: Principal | ICD-10-CM

## 2020-12-08 DIAGNOSIS — G479 Sleep disorder, unspecified: Principal | ICD-10-CM

## 2020-12-08 DIAGNOSIS — M546 Pain in thoracic spine: Principal | ICD-10-CM

## 2020-12-08 DIAGNOSIS — R1310 Dysphagia, unspecified: Principal | ICD-10-CM

## 2020-12-08 DIAGNOSIS — G8929 Other chronic pain: Principal | ICD-10-CM

## 2020-12-08 DIAGNOSIS — K860 Alcohol-induced chronic pancreatitis: Principal | ICD-10-CM

## 2020-12-08 DIAGNOSIS — C7951 Secondary malignant neoplasm of bone: Principal | ICD-10-CM

## 2020-12-08 DIAGNOSIS — F329 Major depressive disorder, single episode, unspecified: Principal | ICD-10-CM

## 2020-12-08 DIAGNOSIS — I10 Essential (primary) hypertension: Principal | ICD-10-CM

## 2020-12-08 DIAGNOSIS — R634 Abnormal weight loss: Principal | ICD-10-CM

## 2020-12-08 DIAGNOSIS — D630 Anemia in neoplastic disease: Principal | ICD-10-CM

## 2020-12-08 DIAGNOSIS — F10180 Alcohol abuse with alcohol-induced anxiety disorder: Principal | ICD-10-CM

## 2020-12-08 DIAGNOSIS — D6481 Anemia due to antineoplastic chemotherapy: Principal | ICD-10-CM

## 2020-12-08 DIAGNOSIS — T451X5D Adverse effect of antineoplastic and immunosuppressive drugs, subsequent encounter: Principal | ICD-10-CM

## 2020-12-08 DIAGNOSIS — F17210 Nicotine dependence, cigarettes, uncomplicated: Principal | ICD-10-CM

## 2020-12-08 DIAGNOSIS — C61 Malignant neoplasm of prostate: Principal | ICD-10-CM

## 2020-12-08 DIAGNOSIS — K1232 Oral mucositis (ulcerative) due to other drugs: Principal | ICD-10-CM

## 2020-12-08 DIAGNOSIS — Z6820 Body mass index (BMI) 20.0-20.9, adult: Principal | ICD-10-CM

## 2020-12-08 DIAGNOSIS — Z452 Encounter for adjustment and management of vascular access device: Principal | ICD-10-CM

## 2020-12-23 DEATH — deceased
